# Patient Record
Sex: Female | Born: 1985 | Race: Black or African American | Hispanic: No | Marital: Single | Smoking: Never smoker
Health system: Southern US, Community
[De-identification: ages and names within clinical notes are randomized; demographics above are authoritative.]

## PROBLEM LIST (undated history)

## (undated) DIAGNOSIS — C801 Malignant (primary) neoplasm, unspecified: Secondary | ICD-10-CM

## (undated) DIAGNOSIS — Z800 Family history of malignant neoplasm of digestive organs: Secondary | ICD-10-CM

## (undated) DIAGNOSIS — Z801 Family history of malignant neoplasm of trachea, bronchus and lung: Secondary | ICD-10-CM

## (undated) DIAGNOSIS — I10 Essential (primary) hypertension: Secondary | ICD-10-CM

## (undated) DIAGNOSIS — C50919 Malignant neoplasm of unspecified site of unspecified female breast: Secondary | ICD-10-CM

## (undated) DIAGNOSIS — I509 Heart failure, unspecified: Secondary | ICD-10-CM

## (undated) HISTORY — DX: Family history of malignant neoplasm of digestive organs: Z80.0

## (undated) HISTORY — DX: Family history of malignant neoplasm of trachea, bronchus and lung: Z80.1

## (undated) HISTORY — DX: Essential (primary) hypertension: I10

---
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
## 2011-06-21 HISTORY — PX: OOPHORECTOMY: SHX86

## 2018-04-03 ENCOUNTER — Other Ambulatory Visit: Payer: Self-pay | Admitting: Family Medicine

## 2018-04-03 DIAGNOSIS — N6310 Unspecified lump in the right breast, unspecified quadrant: Secondary | ICD-10-CM

## 2018-04-09 ENCOUNTER — Other Ambulatory Visit: Payer: Self-pay

## 2018-04-17 ENCOUNTER — Ambulatory Visit
Admission: RE | Admit: 2018-04-17 | Discharge: 2018-04-17 | Disposition: A | Discharge status: Home / Self Care | Payer: BLUE CROSS/BLUE SHIELD | Source: Ambulatory Visit | Attending: Family Medicine | Admitting: Family Medicine

## 2018-04-17 ENCOUNTER — Other Ambulatory Visit: Payer: Self-pay | Admitting: Family Medicine

## 2018-04-17 DIAGNOSIS — N6310 Unspecified lump in the right breast, unspecified quadrant: Secondary | ICD-10-CM

## 2018-04-17 DIAGNOSIS — R599 Enlarged lymph nodes, unspecified: Secondary | ICD-10-CM

## 2018-04-19 ENCOUNTER — Telehealth: Payer: Self-pay | Admitting: Hematology and Oncology

## 2018-04-19 ENCOUNTER — Encounter: Payer: Self-pay | Admitting: *Deleted

## 2018-04-19 DIAGNOSIS — C50211 Malignant neoplasm of upper-inner quadrant of right female breast: Secondary | ICD-10-CM | POA: Insufficient documentation

## 2018-04-19 DIAGNOSIS — Z170 Estrogen receptor positive status [ER+]: Principal | ICD-10-CM

## 2018-04-19 NOTE — Telephone Encounter (Signed)
Spoke with patient to confirm morning [facility] appointment for 11/6, packet will be mailed to patient

## 2018-04-25 ENCOUNTER — Ambulatory Visit: Payer: Self-pay | Admitting: Surgery

## 2018-04-25 ENCOUNTER — Inpatient Hospital Stay: Payer: BLUE CROSS/BLUE SHIELD

## 2018-04-25 ENCOUNTER — Encounter: Payer: Self-pay | Admitting: Physical Therapy

## 2018-04-25 ENCOUNTER — Ambulatory Visit
Admission: RE | Admit: 2018-04-25 | Discharge: 2018-04-25 | Disposition: A | Discharge status: Home / Self Care | Payer: BLUE CROSS/BLUE SHIELD | Source: Ambulatory Visit | Attending: Radiation Oncology | Admitting: Radiation Oncology

## 2018-04-25 ENCOUNTER — Other Ambulatory Visit: Payer: Self-pay | Admitting: *Deleted

## 2018-04-25 ENCOUNTER — Encounter: Payer: Self-pay | Admitting: Hematology and Oncology

## 2018-04-25 ENCOUNTER — Ambulatory Visit: Payer: BLUE CROSS/BLUE SHIELD | Attending: Surgery | Admitting: Physical Therapy

## 2018-04-25 ENCOUNTER — Inpatient Hospital Stay: Payer: BLUE CROSS/BLUE SHIELD | Attending: Hematology and Oncology | Admitting: Hematology and Oncology

## 2018-04-25 ENCOUNTER — Encounter: Payer: Self-pay | Admitting: *Deleted

## 2018-04-25 ENCOUNTER — Other Ambulatory Visit: Payer: Self-pay

## 2018-04-25 VITALS — BP 157/99 | HR 91 | Temp 98.4°F | Resp 17 | Ht 61.0 in | Wt 155.1 lb

## 2018-04-25 DIAGNOSIS — Z170 Estrogen receptor positive status [ER+]: Principal | ICD-10-CM

## 2018-04-25 DIAGNOSIS — Z5111 Encounter for antineoplastic chemotherapy: Secondary | ICD-10-CM | POA: Diagnosis present

## 2018-04-25 DIAGNOSIS — R0602 Shortness of breath: Secondary | ICD-10-CM | POA: Insufficient documentation

## 2018-04-25 DIAGNOSIS — Z5189 Encounter for other specified aftercare: Secondary | ICD-10-CM | POA: Diagnosis not present

## 2018-04-25 DIAGNOSIS — C50211 Malignant neoplasm of upper-inner quadrant of right female breast: Secondary | ICD-10-CM

## 2018-04-25 DIAGNOSIS — Z452 Encounter for adjustment and management of vascular access device: Secondary | ICD-10-CM | POA: Insufficient documentation

## 2018-04-25 DIAGNOSIS — I10 Essential (primary) hypertension: Secondary | ICD-10-CM | POA: Diagnosis not present

## 2018-04-25 DIAGNOSIS — R293 Abnormal posture: Secondary | ICD-10-CM | POA: Insufficient documentation

## 2018-04-25 DIAGNOSIS — R110 Nausea: Secondary | ICD-10-CM | POA: Diagnosis not present

## 2018-04-25 LAB — CMP (CANCER CENTER ONLY)
ALBUMIN: 3.9 g/dL (ref 3.5–5.0)
ALK PHOS: 48 U/L (ref 38–126)
ALT: 12 U/L (ref 0–44)
AST: 16 U/L (ref 15–41)
Anion gap: 9 (ref 5–15)
BUN: 9 mg/dL (ref 6–20)
CO2: 25 mmol/L (ref 22–32)
Calcium: 9.5 mg/dL (ref 8.9–10.3)
Chloride: 106 mmol/L (ref 98–111)
Creatinine: 0.79 mg/dL (ref 0.44–1.00)
GFR, Est AFR Am: >60 mL/min (ref >60)
GFR, Estimated: >60 mL/min (ref >60)
GLUCOSE: 82 mg/dL (ref 70–99)
POTASSIUM: 4.1 mmol/L (ref 3.5–5.1)
SODIUM: 140 mmol/L (ref 135–145)
Total Bilirubin: 0.5 mg/dL (ref 0.3–1.2)
Total Protein: 8 g/dL (ref 6.5–8.1)

## 2018-04-25 LAB — CBC WITH DIFFERENTIAL (CANCER CENTER ONLY)
ABS IMMATURE GRANULOCYTES: 0.01 10*3/uL (ref 0.00–0.07)
Basophils Absolute: 0 10*3/uL (ref 0.0–0.1)
Basophils Relative: 1 %
Eosinophils Absolute: 0.1 10*3/uL (ref 0.0–0.5)
Eosinophils Relative: 1 %
HCT: 42.3 % (ref 36.0–46.0)
Hemoglobin: 13.5 g/dL (ref 12.0–15.0)
IMMATURE GRANULOCYTES: 0 %
LYMPHS ABS: 1.5 10*3/uL (ref 0.7–4.0)
Lymphocytes Relative: 34 %
MCH: 27.7 pg (ref 26.0–34.0)
MCHC: 31.9 g/dL (ref 30.0–36.0)
MCV: 86.7 fL (ref 80.0–100.0)
MONOS PCT: 10 %
Monocytes Absolute: 0.5 10*3/uL (ref 0.1–1.0)
NEUTROS ABS: 2.4 10*3/uL (ref 1.7–7.7)
NEUTROS PCT: 54 %
PLATELETS: 249 10*3/uL (ref 150–400)
RBC: 4.88 MIL/uL (ref 3.87–5.11)
RDW: 12.5 % (ref 11.5–15.5)
WBC Count: 4.4 10*3/uL (ref 4.0–10.5)
nRBC: 0 % (ref 0.0–0.2)

## 2018-04-25 MED ORDER — LIDOCAINE-PRILOCAINE 2.5-2.5 % EX CREA: TOPICAL_CREAM | CUTANEOUS | 3 refills | Status: DC

## 2018-04-25 MED ORDER — DEXAMETHASONE 4 MG PO TABS: 4.0000 mg | ORAL_TABLET | Freq: Every day | ORAL | 0 refills | Status: DC

## 2018-04-25 MED ORDER — ONDANSETRON HCL 8 MG PO TABS: 8.0000 mg | ORAL_TABLET | Freq: Two times a day (BID) | ORAL | 1 refills | Status: DC | PRN

## 2018-04-25 MED ORDER — PROCHLORPERAZINE MALEATE 10 MG PO TABS: 10.0000 mg | ORAL_TABLET | Freq: Four times a day (QID) | ORAL | 1 refills | Status: DC | PRN

## 2018-04-25 MED ORDER — LORAZEPAM 0.5 MG PO TABS: 0.5000 mg | ORAL_TABLET | Freq: Every evening | ORAL | 0 refills | Status: DC | PRN

## 2018-04-25 NOTE — Progress Notes (Signed)
Radiation Oncology         [phone] ________________________________  Name: Miranda Ayers        MRN: [id number]  Date of Service: 04/25/2018 DOB: 1985-08-22  AX:[id number], Lauralyn Primes, DO  No ref. provider found     REFERRING PHYSICIAN: No ref. provider found   DIAGNOSIS: The encounter diagnosis was Malignant neoplasm of upper-inner quadrant of right breast in female, estrogen receptor positive ([place]).   HISTORY OF PRESENT ILLNESS: Miranda Ayers is a 32 y.o. female seen in the multidisciplinary breast clinic for a new diagnosis of right breast cancer. The patient was noted to have a palpable mas sin her right breast and diagnostic imaging revealed a change in the upper outer and inner quadrants with multiple masses spanning 16 cm with fine calcifications spanning 12-14 cm. She had a 1.8 cm mass at 12:30, a 10:00 4.5 cm mass, and at 9:30 a 5 cm mass. She had a biopsy of the 1:00 position revealing invasive ductal caricnoma with clacifications, grade 3 and DCIS, ER/PR positive, HER2 negative, with a Ki 67 of 20%. Her 10:00 site revealed grade 2-3 invasive ductal carcinoma with DCIS, ER/PR positive, HER2 negative with a Ki 67 of 20% and her lymph node was also sampled and involved with carcinoma. She comes today to discuss treatment recommendations for her cancer.    PREVIOUS RADIATION THERAPY: No   PAST MEDICAL HISTORY:  Past Medical History:  Diagnosis Date  . Hypertension        PAST SURGICAL HISTORY: Past Surgical History:  Procedure Laterality Date  . OOPHORECTOMY  2013     FAMILY HISTORY:  Family History  Problem Relation Age of Onset  . Lung cancer Maternal Grandmother   . Lung cancer Maternal Grandfather   . Stomach cancer Paternal Grandfather   . Lung cancer Maternal Aunt   . Stomach cancer Maternal Aunt   . Lung cancer Maternal Uncle      SOCIAL HISTORY:  reports that she has never smoked. She has never used smokeless tobacco. She reports that she  does not drink alcohol or use drugs. The patient is single and lives in [place], she works for [employer]. She has a 91 year old daughter and is accompanied by her mother.    ALLERGIES: Patient has no allergy information on record.   MEDICATIONS:  No current outpatient medications on file.   No current facility-administered medications for this encounter.      REVIEW OF SYSTEMS: On review of systems, the patient reports that she is doing well overall. She denies any chest pain, shortness of breath, cough, fevers, chills, night sweats, unintended weight changes. She denies any bowel or bladder disturbances, and denies abdominal pain, nausea or vomiting. She denies any new musculoskeletal or joint aches or pains. A complete review of systems is obtained and is otherwise negative.     PHYSICAL EXAM:  Wt Readings from Last 3 Encounters:  04/25/18 155 lb 1.6 oz (70.4 kg)   Temp Readings from Last 3 Encounters:  04/25/18 98.4 F (36.9 C) (Oral)   BP Readings from Last 3 Encounters:  04/25/18 (!) 157/99   Pulse Readings from Last 3 Encounters:  04/25/18 91   In general this is a well appearing African American in no acute distress. She's alert and oriented x4 and appropriate throughout the examination. Cardiopulmonary assessment is negative for acute distress and she exhibits normal effort. Bilateral breast exam is deferred.   ECOG = 1  0 - Asymptomatic (Fully active,  able to carry on all predisease activities without restriction)  1 - Symptomatic but completely ambulatory (Restricted in physically strenuous activity but ambulatory and able to carry out work of a light or sedentary nature. For example, light housework, office work)  2 - Symptomatic, <50% in bed during the day (Ambulatory and capable of all self care but unable to carry out any work activities. Up and about more than 50% of waking hours)  3 - Symptomatic, >50% in bed, but not bedbound (Capable of only limited self-care,  confined to bed or chair 50% or more of waking hours)  4 - Bedbound (Completely disabled. Cannot carry on any self-care. Totally confined to bed or chair)  5 - Death   Eustace Pen MM, Creech RH, Tormey DC, et al. [phone]). "Toxicity and response criteria of the [facility]". [place] Oncol. 5 (6): 649-55    LABORATORY DATA:  Lab Results  Component Value Date   WBC 4.4 04/25/2018   HGB 13.5 04/25/2018   HCT 42.3 04/25/2018   MCV 86.7 04/25/2018   PLT 249 04/25/2018   Lab Results  Component Value Date   NA 140 04/25/2018   K 4.1 04/25/2018   CL 106 04/25/2018   CO2 25 04/25/2018   Lab Results  Component Value Date   ALT 12 04/25/2018   AST 16 04/25/2018   ALKPHOS 48 04/25/2018   BILITOT 0.5 04/25/2018      RADIOGRAPHY: [place] Axillary Node Core Biopsy Right  Result Date: 04/17/2018 CLINICAL DATA:  Suspicious right breast 1 o'clock mass, and enlarged right axillary lymph node. EXAM: ULTRASOUND GUIDED RIGHT BREAST CORE NEEDLE BIOPSY COMPARISON:  Previous exam(s). FINDINGS: I met with the patient and we discussed the procedure of ultrasound-guided biopsy, including benefits and alternatives. We discussed the high likelihood of a successful procedure. We discussed the risks of the procedure, including infection, bleeding, tissue injury, clip migration, and inadequate sampling. Informed written consent was given. The usual time-out protocol was performed immediately prior to the procedure. Lesion quadrant: Upper inner quadrant, and right axilla. Using sterile technique and 1% Lidocaine as local anesthetic, under direct ultrasound visualization, a 14 gauge spring-loaded device was used to perform biopsy of right breast 1 o'clock mass using a inferior approach. At the conclusion of the procedure a ribbon shaped tissue marker clip was deployed into the biopsy cavity. Next, using sterile technique and 1% Lidocaine as local anesthetic, under direct ultrasound visualization, a  14 gauge spring-loaded device was used to perform biopsy of an enlarged right axillary lymph node using a inferior approach. At the conclusion of the procedure a spiral shaped HydroMARK tissue marker clip was deployed into the biopsy cavity. Follow up 2 view mammogram was performed and dictated separately. IMPRESSION: Ultrasound guided biopsy of right breast 1 o'clock mass, and abnormal right axillary lymph node. No apparent complications. Electronically Signed   By: Fidela Salisbury M.D.   On: 04/17/2018 09:49   Mm Clip Placement Right  Result Date: 04/17/2018 CLINICAL DATA:  Post ultrasound-guided core needle biopsy of right breast 1 o'clock mass, and right axillary lymph node, and post stereotactic core needle biopsy of right lateral breast calcifications. EXAM: DIAGNOSTIC RIGHT MAMMOGRAM POST ULTRASOUND AND STEREOTACTIC BIOPSY COMPARISON:  Previous exam(s). FINDINGS: Mammographic images were obtained following ultrasound and stereotactic guided biopsy of the right breast. Two-view mammography demonstrates presence of ribbon shaped marker within the 1 o'clock right breast mass. Coil shaped marker is seen in the slightly upper outer right breast, middle  depth, at the site of the stereotactic core needle biopsy. The 2 markers are 10.4 cm apart in transverse dimension and 5.9 cm apart in anterior to posterior dimension. A HydroMARK is also seen within the right axilla post biopsy of a right axillary lymph node. IMPRESSION: Successful placement of post biopsy tissue markers, post ultrasound-guided core needle biopsy of right breast 1 o'clock mass, right axillary lymph node, and stereotactic core needle biopsy of right lateral breast calcifications. Final Assessment: Post Procedure Mammograms for Marker Placement Electronically Signed   By: Fidela Salisbury M.D.   On: 04/17/2018 09:43   Mm Rt Breast Bx W Loc Dev 1st Lesion Image Bx Spec Stereo Guide  Addendum Date: 04/19/2018   ADDENDUM REPORT:  04/18/2018 13:56 ADDENDUM: Pathology revealed GRADE III INVASIVE DUCTAL CARCINOMA WITH CALCIFICATIONS, DUCTAL CARCINOMA IN SITU of the Right breast, 1 o'clock. 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 This was found to be concordant by Dr. Fidela Salisbury. Pathology revealed GRADE II-III INVASIVE DUCTAL CARCINOMA WITH CALCIFICATIONS of the Right breast, lateral. This was found to be concordant by Dr. Fidela Salisbury. Pathology results were discussed with the patient by telephone. The patient reported doing well after the biopsies with tenderness at the sites. Post biopsy instructions and care were reviewed and questions were answered. The patient was encouraged to call [facility] for any additional concerns. The patient was referred to [facility] on April 25, 2018. Recommendation for a bilateral breast MRI for further evaluation of extent of disease and pre menopausal. Pathology results reported by Terie Purser, RN on 04/18/2018. Electronically Signed   By: Fidela Salisbury M.D.   On: 04/18/2018 13:56   Result Date: 04/19/2018 CLINICAL DATA:  Suspicious right lateral breast calcifications. EXAM: RIGHT BREAST STEREOTACTIC CORE NEEDLE BIOPSY COMPARISON:  Previous exams. FINDINGS: The patient and I discussed the procedure of stereotactic-guided biopsy including benefits and alternatives. We discussed the high likelihood of a successful procedure. We discussed the risks of the procedure including infection, bleeding, tissue injury, clip migration, and inadequate sampling. Informed written consent was given. The usual time out protocol was performed immediately prior to the procedure. Using sterile technique and 1% Lidocaine as local anesthetic, under stereotactic guidance, a 9 gauge vacuum assisted device was used to perform core needle biopsy of calcifications in the slightly upper outer  quadrant of the right breast. Using a superior approach. Specimen radiograph was performed showing presence of calcifications. Specimens with calcifications are identified for pathology. Lesion quadrant: Upper outer quadrant At the conclusion of the procedure, a coil shaped tissue marker clip was deployed into the biopsy cavity. Follow-up 2-view mammogram was performed and dictated separately. IMPRESSION: Stereotactic-guided biopsy of right breast. No apparent complications. Electronically Signed: By: Fidela Salisbury M.D. On: 04/17/2018 09:46   [place] Rt Breast Bx W Loc Dev 1st Lesion Img Bx Spec US Guide  Result Date: 04/17/2018 CLINICAL DATA:  Suspicious right breast 1 o'clock mass, and enlarged right axillary lymph node. EXAM: ULTRASOUND GUIDED RIGHT BREAST CORE NEEDLE BIOPSY COMPARISON:  Previous exam(s). FINDINGS: I met with the patient and we discussed the procedure of ultrasound-guided biopsy, including benefits and alternatives. We discussed the high likelihood of a successful procedure. We discussed the risks of the procedure, including infection, bleeding, tissue injury, clip migration, and inadequate sampling. Informed written consent was given. The usual time-out protocol was performed immediately prior to the procedure. Lesion quadrant: Upper inner quadrant,  and right axilla. Using sterile technique and 1% Lidocaine as local anesthetic, under direct ultrasound visualization, a 14 gauge spring-loaded device was used to perform biopsy of right breast 1 o'clock mass using a inferior approach. At the conclusion of the procedure a ribbon shaped tissue marker clip was deployed into the biopsy cavity. Next, using sterile technique and 1% Lidocaine as local anesthetic, under direct ultrasound visualization, a 14 gauge spring-loaded device was used to perform biopsy of an enlarged right axillary lymph node using a inferior approach. At the conclusion of the procedure a spiral shaped HydroMARK tissue marker  clip was deployed into the biopsy cavity. Follow up 2 view mammogram was performed and dictated separately. IMPRESSION: Ultrasound guided biopsy of right breast 1 o'clock mass, and abnormal right axillary lymph node. No apparent complications. Electronically Signed   By: Fidela Salisbury M.D.   On: 04/17/2018 09:49       IMPRESSION/PLAN: 1. 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  Dr. Lisbeth Renshaw discusses the pathology findings and reviews the nature of node positive invasive ductal carcinoma. She is planning to proceed with genetics, and neoadjuvant therapy. She would benefit from bilateral mastectomies and right ALND. Following surgery, she would also benefit from adjuvant external radiotherapy to the breast followed by antiestrogen therapy. We discussed the risks, benefits, short, and long term effects of radiotherapy, and the patient is interested in proceeding. Dr. Lisbeth Renshaw discusses the delivery and logistics of radiotherapy and anticipates a course of 6 1/2 weeks of radiotherapy with right regional node coverage. We will see her back about 2 weeks after surgery to discuss the simulation process and anticipate we starting radiotherapy about 4-6 weeks after surgery.  2. Possible genetic predisposition to malignancy. The patient is a candidate for genetic testing given her personal and family history. She was offered referral and will be seen asap.  In a visit lasting 60 minutes, greater than 50% of the time was spent face to face discussing her case, and coordinating the patient's care.   The above documentation reflects my direct findings during this shared patient visit. Please see the separate note by Dr. Lisbeth Renshaw on this date for the remainder of the patient's plan of care.    Carola Rhine, PAC

## 2018-04-25 NOTE — H&P (View-Only) (Signed)
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 She is sore but no other symptoms.                    Suspicious right breast 1 o'clock mass, and enlarged right axillary lymph node.  EXAM: ULTRASOUND GUIDED RIGHT BREAST CORE NEEDLE BIOPSY  COMPARISON: Previous exam(s).  FINDINGS: I met with the patient and we discussed the procedure of ultrasound-guided biopsy, including benefits and alternatives. We discussed the high likelihood of a successful procedure. We discussed the risks of the procedure, including infection, bleeding, tissue injury, clip migration, and inadequate sampling. Informed written consent was given. The usual time-out protocol was performed immediately prior to the procedure.  Lesion quadrant: Upper inner quadrant, and right axilla.  Using sterile technique and 1% Lidocaine as local anesthetic, under direct ultrasound visualization, a 14 gauge spring-loaded device was used to perform biopsy of right breast 1 o'clock mass using a inferior approach. At the conclusion of the procedure a ribbon shaped tissue marker clip was deployed into the biopsy cavity.  Next, using sterile technique and 1% Lidocaine as local anesthetic, under direct ultrasound visualization, a 14 gauge spring-loaded device was used to perform biopsy of an enlarged right axillary lymph node using a inferior approach. At the conclusion of the procedure a spiral shaped HydroMARK tissue marker clip was deployed into the biopsy cavity.  Follow up 2 view mammogram was performed and dictated  separately.  IMPRESSION: Ultrasound guided biopsy of right breast 1 o'clock mass, and abnormal right axillary lymph node. No apparent complications.   Electronically Signed By: Miranda Ayers M.D. On: 04/17/2018 09:49                                  INFORMATION: 1. FLUORESCENCE IN-SITU HYBRIDIZATION Results: GROUP 4: HER2 **NEGATIVE** Equivocal form of amplification of the HER2 gene was detected in the IHC 2+ tissue sample received from this individual. HER2 FISH was performed by a technologist and cell imaging and analysis on the [employer]View. First Analysis: RATIO OF HER2/CEN 17 SIGNALS 1.79 AVERAGE HER2 COPY NUMBER PER CELL 4.65 An additional 20 cells were analyzed by FISH. Additional Analysis: RATIO OF HER2/CEN 17 SIGNALS 1.78 AVERAGE HER2 COPY NUMBER PER CELL 4.45 The ratio of HER2/CEN 17 is within the normal range < 2.0 of HER2/CEN 17 and a copy number of HER2 signals per cell is >=4.0 and < 6.0. Arch Pathol Lab Med 1:1,2018 COMMENT: It is uncertain whether patients with >=4.0 and <6.0 average HER2 signals/cell and HER2/CEN 17 ratio <2.0 benefit from HER2 targeted therapy in the absence of protein overexpression (IHC 3+). If the specimen test result is close to the ISH ratio threshold for positive, there is a high likelihood that repeat testing will result in different results by chance alone. Therefore, when [facility] results are not 3+ positive, it is recommended that the sample be considered HER2 negative without additional testing on the same specimen. Miranda Males MD Pathologist, Electronic Signature ( Signed 04/20/2018) 1. PROGNOSTIC INDICATORS 1 of 4 FINAL for Miranda Ayers [phone]) ADDITIONAL INFORMATION:(continued)  Results: IMMUNOHISTOCHEMICAL AND MORPHOMETRIC ANALYSIS PERFORMED MANUALLY The tumor cells are EQUIVOCAL for Her2 (2+). Her2 by FISH will be performed and the results reported separately. Estrogen  Receptor: 80%, POSITIVE, STRONG STAINING INTENSITY Progesterone Receptor: 2%, POSITIVE, MODERATE STAINING INTENSITY Proliferation Marker Ki67: 20% REFERENCE RANGE ESTROGEN RECEPTOR NEGATIVE 0% POSITIVE =>1% REFERENCE RANGE PROGESTERONE RECEPTOR NEGATIVE 0% POSITIVE =>1% All controls stained appropriately Miranda Males MD Pathologist, Electronic Signature ( Signed 04/19/2018) 3. FLUORESCENCE IN-SITU HYBRIDIZATION Results: GROUP 5: HER2 **NEGATIVE** Equivocal form of amplification of the HER2 gene was detected in the IHC 2+ tissue sample received from this individual. HER2 FISH was performed by a technologist and cell imaging and analysis on the BioView. RATIO OF HER2/CEN17 SIGNALS 1.60 AVERAGE HER2 COPY NUMBER PER CELL 3.20 The ratio of HER2/CEN 17 is within the range < 2.0 of HER2/CEN 17 and a copy number of HER2 signals per cell is <4.0. Arch Pathol Lab Med 1:1,2018 Miranda Males MD Pathologist, Electronic Signature ( Signed 04/20/2018) 3. Results: IMMUNOHISTOCHEMICAL AND MORPHOMETRIC ANALYSIS PERFORMED MANUALLY The tumor cells are EQUIVOCAL for Her2 (2+). Her2 by FISH will be performed and the results reported separately. Estrogen Receptor: 50%, POSITIVE, STRONG STAINING INTENSITY Progesterone Receptor: 5%, POSITIVE, STRONG STAINING INTENSITY Proliferation Marker Ki67: 20% 2 of 4 FINAL for Miranda Ayers ([id number]) ADDITIONAL INFORMATION:(continued) REFERENCE RANGE ESTROGEN RECEPTOR NEGATIVE 0% POSITIVE =>1% REFERENCE RANGE PROGESTERONE RECEPTOR NEGATIVE 0% POSITIVE =>1% All controls stained appropriately Miranda Males MD Pathologist, Electronic Signature ( Signed 04/19/2018) FINAL DIAGNOSIS Diagnosis 1. Breast, right, needle core biopsy, 1 o'clock - INVASIVE DUCTAL CARCINOMA WITH CALCIFICATIONS, SEE COMMENT. - DUCTAL CARCINOMA IN SITU. 2. Lymph node, needle/core biopsy, right axilla - METASTATIC CARCINOMA IN ONE LYMPH NODE. 3. Breast, right, needle core  biopsy, lateral calcifications - INVASIVE DUCTAL CARCINOMA WITH CALCIFICATIONS, SEE COMMENT. Microscopic Comment 1. The carcinoma appears grade 3. Prognostic markers will be ordered. Dr. Lyndon Code has reviewed the case. The case was called to [place] on 04/18/2018. 2. The morphology best corresponds with part #3. 3. The carcinoma appears grade 2-3. The morphology is slightly different than part #1. Prognostic markers will be ordered. Dr. Lyndon Code has reviewed the case. The case was called to [facility] on 04/18/2018. Miranda Males MD Pathologist, Electronic Signature (Case signed 04/18/2018) Specimen Gross and Clinical Information Specimen Comment 1. TIF 8 AM; extracted < 2 min; suspicious right breast mass with malignant looking calcifications, involving up to 16cm of tissue 2. TIF 8:30 AM; extracted < 2 min; probably malignant right axillary LN 3. TIF: 9:15 AM; extracted < 5 min Specimen(s) Obtained: 1. Breast, right, needle core biopsy, 1 o'clock 2. Lymph node, needle/core biopsy, right axilla 3. Breast, right, needle core biopsy, lateral calcifications 3 of 4 FINAL for [place], Sherald Ayers [phone]) Specimen Clinical Information 1. Multicentric right breast cancer Gross 1. Received in formalin (TIF 8 a.m., CIT less than 2 minutes) are four cores of soft tan-yellow tissue measuring 0.7 to 1.8 cm in length and each 0.2 cm in diameter. The specimen is submitted in toto. 2. Received in formalin (TIF 8:30 a.m., CIT less than 2 minutes) are three cores of soft tan-yellow tissue measuring 0.4 to 1.0 cm in length and each 0.2 cm in diameter. The specimen is submitted in toto. 3. Received in formalin (TIF 9:15 a.m., CIT less than 5 minutes) are multiple fragments and cores of soft tan-yellow tissue measuring 1.5 x 0.8 x 0.2 cm in aggregate. The specimen is submitted in toto. (  GP:ah 04/17/18 ) Stain(s) used in Diagnosis: The following stain(s)  were used in diagnosing the case: ER-ACIS, PR-ACIS, Her2 FISH, Her2 by IHC, KI-67-ACIS. The control(s) stained appropriately. Disclaimer IHC scores are reported using ASCO/CAP scoring criteria. An IHC Score of 0 or 1+ is NEGATIVE for HER2, 3+ is POSITIVE for HER2, and 2+ is EQUIVOCAL. Equivocal results are reflexed to either FISH or IHC testing. Specimens are fixed in 10% Neutral Buffered Formalin for at least 6 hours and up to 72 hours. These tests have not be validated on decalcified tissue. Results should be interpreted with caution given the possibility of false negative results on decalcified specimens. PR progesterone receptor (16), immunohistochemical stains are performed on formalin fixed, paraffin embedded tissue using a 3,3"-diaminobenzidine (DAB) chromogen and Leica Bond Autostainer System. The staining intensity of the nucleus is scored manually and is reported as the percentage of tumor cell nuclei demonstrating specific nuclear staining.Specimens are fixed in 10% Neutral Buffered Formalin for at least 6 hours and up to 72 hours. These tests have not be validated on decalcified tissue. Results should be interpreted with caution given the possibility of false negative results on decalcified specimens. Ki-67 (MM1), immunohistochemical stains are performed on formalin fixed, paraffin embedded tissue using a 3,3"-diaminobenzidine (DAB) chromogen and Leica Bond Autostainer System. The staining intensity of the nucleus is scored manually and is reported as the percentage of tumor cell nuclei demonstrating specific nuclear staining.Specimens are fixed in 10% Neutral Buffered Formalin for at least 6 hours and up to 72 hours. These tests have not be validated on decalcified tissue. Results should be interpreted with caution.  The patient is a 32 year old female.   Past Surgical History (Sylvia Ledford, RN; 04/25/2018 7:32 AM) Breast Biopsy Right.  Diagnostic Studies History (Sylvia Ledford,  RN; 04/25/2018 7:32 AM) Colonoscopy never Mammogram within last year Pap Smear 1-5 years ago  Medication History (Sylvia Ledford, RN; 04/25/2018 7:32 AM) Medications Reconciled  Social History (Sylvia Ledford, RN; 04/25/2018 7:32 AM) Alcohol use Occasional alcohol use. Caffeine use Carbonated beverages, Coffee, Tea. No drug use Tobacco use Former smoker.  Family History (Sylvia Ledford, RN; 04/25/2018 7:32 AM) Alcohol Abuse Family Members In General. Arthritis Family Members In General, Father. Cerebrovascular Accident Family Members In General. Cervical Cancer Family Members In General. Colon Cancer Family Members In General. Colon Polyps Mother. Diabetes Mellitus Family Members In General, Father. Heart Disease Family Members In General, Mother, Sister. Heart disease in female family member before age 65 Hypertension Family Members In General, Father, Mother. Prostate Cancer Family Members In General.  Pregnancy / Birth History (Sylvia Ledford, RN; 04/25/2018 7:32 AM) Gravida 1 Para 1  Other Problems (Sylvia Ledford, RN; 04/25/2018 7:32 AM) Back Pain High blood pressure Lump In Breast Migraine Headache Oophorectomy Right. Seizure Disorder     Review of Systems (Sylvia Ledford RN; 04/25/2018 7:32 AM) General Present- Weight Loss. Not Present- Appetite Loss, Chills, Fatigue, Fever, Night Sweats and Weight Gain. Skin Present- Dryness. Not Present- Change in Wart/Mole, Hives, Jaundice, New Lesions, Non-Healing Wounds, Rash and Ulcer. HEENT Not Present- Earache, Hearing Loss, Hoarseness, Nose Bleed, Oral Ulcers, Ringing in the Ears, Seasonal Allergies, Sinus Pain, Sore Throat, Visual Disturbances, Wears glasses/contact lenses and Yellow Eyes. Respiratory Not Present- Bloody sputum, Chronic Cough, Difficulty Breathing, Snoring and Wheezing. Breast Present- Breast Mass and Breast Pain. Not Present- Nipple Discharge and Skin Changes. Cardiovascular Not  Present- Chest Pain, Difficulty Breathing Lying Down, Leg Cramps, Palpitations, Rapid Heart Rate, Shortness of Breath and Swelling of   Extremities. Gastrointestinal Present- Change in Bowel Habits, Gets full quickly at meals, Indigestion, Nausea and Vomiting. Not Present- Abdominal Pain, Bloating, Bloody Stool, Chronic diarrhea, Constipation, Difficulty Swallowing, Excessive gas, Hemorrhoids and Rectal Pain. Female Genitourinary Not Present- Frequency, Nocturia, Painful Urination, Pelvic Pain and Urgency. Musculoskeletal Present- Back Pain. Not Present- Joint Pain, Joint Stiffness, Muscle Pain, Muscle Weakness and Swelling of Extremities. Neurological Present- Headaches. Not Present- Decreased Memory, Fainting, Numbness, Seizures, Tingling, Tremor, Trouble walking and Weakness. Psychiatric Not Present- Anxiety, Bipolar, Change in Sleep Pattern, Depression, Fearful and Frequent crying. Endocrine Present- Hot flashes. Not Present- Cold Intolerance, Excessive Hunger, Hair Changes, Heat Intolerance and New Diabetes. Hematology Present- Easy Bruising. Not Present- Blood Thinners, Excessive bleeding, Gland problems, HIV and Persistent Infections.   Physical Exam (Oris Staffieri A. Tocara Mennen MD; 04/25/2018 11:56 AM)  General Mental Status-Alert. General Appearance-Consistent with stated age. Hydration-Well hydrated. Voice-Normal.  Eye Eyeball - Bilateral-Extraocular movements intact. Sclera/Conjunctiva - Bilateral-No scleral icterus.  Chest and Lung Exam Chest and lung exam reveals -quiet, even and easy respiratory effort with no use of accessory muscles and on auscultation, normal breath sounds, no adventitious sounds and normal vocal resonance. Inspection Chest Wall - Normal. Back - normal.  Breast Note: large right breast mass 4 cm mobile multiple enlarged LN right axilla  left breast normal  Cardiovascular Cardiovascular examination reveals -normal heart sounds, regular rate and  rhythm with no murmurs and normal pedal pulses bilaterally.  Neurologic Neurologic evaluation reveals -alert and oriented x 3 with no impairment of recent or remote memory. Mental Status-Normal.  Musculoskeletal Normal Exam - Left-Upper Extremity Strength Normal and Lower Extremity Strength Normal. Normal Exam - Right-Upper Extremity Strength Normal and Lower Extremity Strength Normal.  Lymphatic Head & Neck  General Head & Neck Lymphatics: Bilateral - Description - Normal. Axillary  General Axillary Region: Bilateral - Description - Normal. Tenderness - Non Tender.    Assessment & Plan (Anola Mcgough A. Alishah Schulte MD; 04/25/2018 11:58 AM)  CANCER OF OVERLAPPING SITES OF RIGHT FEMALE BREAST (C50.811)   MALIGNANT NEOPLASM OF OVERLAPPING SITES OF RIGHT BREAST IN FEMALE, ESTROGEN RECEPTOR NEGATIVE (C50.811) Impression: Pt will need port for chemotherapy   she will need MRM after treatment due to multiple sites in the same breast and multiple posiitve LN   Pt requires port placement for chemotherapy. Risk include bleeding, infection, pneumothorax, hemothorax, mediastinal injury, nerve injury , blood vessel injury, strke, blood clots, death, migration. embolization and need for additional procedures. Pt agrees to proceed.

## 2018-04-25 NOTE — Patient Instructions (Signed)

## 2018-04-25 NOTE — Progress Notes (Signed)
Clinical Social Work [facility] Psychosocial Distress Screening [place]  Patient completed distress screening protocol and scored a 6 on the Psychosocial Distress Thermometer which indicates moderate distress. Clinical Social Worker met with patient and patients family in [facility] to assess for distress and other psychosocial needs. Patient stated she was feeling overwhelmed but felt "better" after meeting with the treatment team and getting more information on her treatment plan. CSW and patient discussed common feeling and emotions when being diagnosed with cancer, and the importance of support during treatment. CSW informed patient of the support team and support services at [facility]. CSW provided contact information and encouraged patient to call with any questions or concerns.  ONCBCN DISTRESS SCREENING 04/25/2018  Screening Type Initial Screening  Distress experienced in past week (1-10) 6  Practical problem type Housing  Emotional problem type Adjusting to illness;Boredom;Adjusting to appearance changes  Spiritual/Religous concerns type Relating to God  Physical Problem type Pain  Physician notified of physical symptoms Yes     Johnnye Lana, MSW, LCSW, OSW-C Clinical Social Worker [place] [phone]

## 2018-04-25 NOTE — H&P (Signed)
Miranda Ayers Documented: 04/25/2018 7:32 AM Location: [facility] Patient #: [id number] DOB: 1986/04/25 Undefined / Language: [name] / Race: Black or African American Female  History of Present Illness Marcello Moores A. 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 She is sore but no other symptoms.                    Suspicious right breast 1 o'clock mass, and enlarged right axillary lymph node.  EXAM: ULTRASOUND GUIDED RIGHT BREAST CORE NEEDLE BIOPSY  COMPARISON: Previous exam(s).  FINDINGS: I met with the patient and we discussed the procedure of ultrasound-guided biopsy, including benefits and alternatives. We discussed the high likelihood of a successful procedure. We discussed the risks of the procedure, including infection, bleeding, tissue injury, clip migration, and inadequate sampling. Informed written consent was given. The usual time-out protocol was performed immediately prior to the procedure.  Lesion quadrant: Upper inner quadrant, and right axilla.  Using sterile technique and 1% Lidocaine as local anesthetic, under direct ultrasound visualization, a 14 gauge spring-loaded device was used to perform biopsy of right breast 1 o'clock mass using a inferior approach. At the conclusion of the procedure a ribbon shaped tissue marker clip was deployed into the biopsy cavity.  Next, using sterile technique and 1% Lidocaine as local anesthetic, under direct ultrasound visualization, a 14 gauge spring-loaded device was used to perform biopsy of an enlarged right axillary lymph node using a inferior approach. At the conclusion of the procedure a spiral shaped HydroMARK tissue marker clip was deployed into the biopsy cavity.  Follow up 2 view mammogram was performed and dictated  separately.  IMPRESSION: Ultrasound guided biopsy of right breast 1 o'clock mass, and abnormal right axillary lymph node. No apparent complications.   Electronically Signed By: Fidela Salisbury M.D. On: 04/17/2018 09:49                                  INFORMATION: 1. FLUORESCENCE IN-SITU HYBRIDIZATION Results: GROUP 4: HER2 **NEGATIVE** Equivocal form of amplification of the HER2 gene was detected in the IHC 2+ tissue sample received from this individual. HER2 FISH was performed by a technologist and cell imaging and analysis on the BioView. First Analysis: RATIO OF HER2/CEN 17 SIGNALS 1.79 AVERAGE HER2 COPY NUMBER PER CELL 4.65 An additional 20 cells were analyzed by FISH. Additional Analysis: RATIO OF HER2/CEN 17 SIGNALS 1.78 AVERAGE HER2 COPY NUMBER PER CELL 4.45 The ratio of HER2/CEN 17 is within the normal range < 2.0 of HER2/CEN 17 and a copy number of HER2 signals per cell is >=4.0 and < 6.0. Arch Pathol Lab Med 1:1,2018 COMMENT: It is uncertain whether patients with >=4.0 and <6.0 average HER2 signals/cell and HER2/CEN 17 ratio <2.0 benefit from HER2 targeted therapy in the absence of protein overexpression (IHC 3+). If the specimen test result is close to the ISH ratio threshold for positive, there is a high likelihood that repeat testing will result in different results by chance alone. Therefore, when [facility] results are not 3+ positive, it is recommended that the sample be considered HER2 negative without additional testing on the same specimen. Vicente Males MD Pathologist, Electronic Signature ( Signed 04/20/2018) 1. PROGNOSTIC INDICATORS 1 of 4 FINAL for Miranda Ayers, Miranda Ayers [phone]) ADDITIONAL INFORMATION:(continued)  Results: IMMUNOHISTOCHEMICAL AND MORPHOMETRIC ANALYSIS PERFORMED MANUALLY The tumor cells are EQUIVOCAL for Her2 (2+). Her2 by FISH will be performed and the results reported separately. Estrogen  Receptor: 80%, POSITIVE, STRONG STAINING INTENSITY Progesterone Receptor: 2%, POSITIVE, MODERATE STAINING INTENSITY Proliferation Marker Ki67: 20% REFERENCE RANGE ESTROGEN RECEPTOR NEGATIVE 0% POSITIVE =>1% REFERENCE RANGE PROGESTERONE RECEPTOR NEGATIVE 0% POSITIVE =>1% All controls stained appropriately Vicente Males MD Pathologist, Electronic Signature ( Signed 04/19/2018) 3. FLUORESCENCE IN-SITU HYBRIDIZATION Results: GROUP 5: HER2 **NEGATIVE** Equivocal form of amplification of the HER2 gene was detected in the IHC 2+ tissue sample received from this individual. HER2 FISH was performed by a technologist and cell imaging and analysis on the BioView. RATIO OF HER2/CEN17 SIGNALS 1.60 AVERAGE HER2 COPY NUMBER PER CELL 3.20 The ratio of HER2/CEN 17 is within the range < 2.0 of HER2/CEN 17 and a copy number of HER2 signals per cell is <4.0. Arch Pathol Lab Med 1:1,2018 Vicente Males MD Pathologist, Electronic Signature ( Signed 04/20/2018) 3. Results: IMMUNOHISTOCHEMICAL AND MORPHOMETRIC ANALYSIS PERFORMED MANUALLY The tumor cells are EQUIVOCAL for Her2 (2+). Her2 by FISH will be performed and the results reported separately. Estrogen Receptor: 50%, POSITIVE, STRONG STAINING INTENSITY Progesterone Receptor: 5%, POSITIVE, STRONG STAINING INTENSITY Proliferation Marker Ki67: 20% 2 of 4 FINAL for Miranda Ayers, Miranda Ayers ([phone]) ADDITIONAL INFORMATION:(continued) REFERENCE RANGE ESTROGEN RECEPTOR NEGATIVE 0% POSITIVE =>1% REFERENCE RANGE PROGESTERONE RECEPTOR NEGATIVE 0% POSITIVE =>1% All controls stained appropriately Vicente Males MD Pathologist, Electronic Signature ( Signed 04/19/2018) FINAL DIAGNOSIS Diagnosis 1. Breast, right, needle core biopsy, 1 o'clock - INVASIVE DUCTAL CARCINOMA WITH CALCIFICATIONS, SEE COMMENT. - DUCTAL CARCINOMA IN SITU. 2. Lymph node, needle/core biopsy, right axilla - METASTATIC CARCINOMA IN ONE LYMPH NODE. 3. Breast, right, needle core  biopsy, lateral calcifications - INVASIVE DUCTAL CARCINOMA WITH CALCIFICATIONS, SEE COMMENT. Microscopic Comment 1. The carcinoma appears grade 3. Prognostic markers will be ordered. Dr. Lyndon Code has reviewed the case. The case was called to [place] on 04/18/2018. 2. The morphology best corresponds with part #3. 3. The carcinoma appears grade 2-3. The morphology is slightly different than part #1. Prognostic markers will be ordered. Dr. Lyndon Code has reviewed the case. The case was called to [facility] on 04/18/2018. Vicente Males MD Pathologist, Electronic Signature (Case signed 04/18/2018) Specimen Gross and Clinical Information Specimen Comment 1. TIF 8 AM; extracted < 2 min; suspicious right breast mass with malignant looking calcifications, involving up to 16cm of tissue 2. TIF 8:30 AM; extracted < 2 min; probably malignant right axillary LN 3. TIF: 9:15 AM; extracted < 5 min Specimen(s) Obtained: 1. Breast, right, needle core biopsy, 1 o'clock 2. Lymph node, needle/core biopsy, right axilla 3. Breast, right, needle core biopsy, lateral calcifications 3 of 4 FINAL for Miranda Ayers, Miranda Ayers [phone]) Specimen Clinical Information 1. Multicentric right breast cancer Gross 1. Received in formalin (TIF 8 a.m., CIT less than 2 minutes) are four cores of soft tan-yellow tissue measuring 0.7 to 1.8 cm in length and each 0.2 cm in diameter. The specimen is submitted in toto. 2. Received in formalin (TIF 8:30 a.m., CIT less than 2 minutes) are three cores of soft tan-yellow tissue measuring 0.4 to 1.0 cm in length and each 0.2 cm in diameter. The specimen is submitted in toto. 3. Received in formalin (TIF 9:15 a.m., CIT less than 5 minutes) are multiple fragments and cores of soft tan-yellow tissue measuring 1.5 x 0.8 x 0.2 cm in aggregate. The specimen is submitted in toto. (  GP:ah 04/17/18 ) Stain(s) used in Diagnosis: The following stain(s)  were used in diagnosing the case: ER-ACIS, PR-ACIS, Her2 FISH, Her2 by IHC, KI-67-ACIS. The control(s) stained appropriately. Disclaimer IHC scores are reported using ASCO/CAP scoring criteria. An IHC Score of 0 or 1+ is NEGATIVE for HER2, 3+ is POSITIVE for HER2, and 2+ is EQUIVOCAL. Equivocal results are reflexed to either FISH or IHC testing. Specimens are fixed in 10% Neutral Buffered Formalin for at least 6 hours and up to 72 hours. These tests have not be validated on decalcified tissue. Results should be interpreted with caution given the possibility of false negative results on decalcified specimens. PR progesterone receptor (16), immunohistochemical stains are performed on formalin fixed, paraffin embedded tissue using a 3,3"-diaminobenzidine (DAB) chromogen and Leica Bond Autostainer System. The staining intensity of the nucleus is scored manually and is reported as the percentage of tumor cell nuclei demonstrating specific nuclear staining.Specimens are fixed in 10% Neutral Buffered Formalin for at least 6 hours and up to 72 hours. These tests have not be validated on decalcified tissue. Results should be interpreted with caution given the possibility of false negative results on decalcified specimens. Ki-67 (MM1), immunohistochemical stains are performed on formalin fixed, paraffin embedded tissue using a 3,3"-diaminobenzidine (DAB) chromogen and Leica Bond Autostainer System. The staining intensity of the nucleus is scored manually and is reported as the percentage of tumor cell nuclei demonstrating specific nuclear staining.Specimens are fixed in 10% Neutral Buffered Formalin for at least 6 hours and up to 72 hours. These tests have not be validated on decalcified tissue. Results should be interpreted with caution.  The patient is a 32 year old female.   Past Surgical History Tawni Pummel, RN; 04/25/2018 7:32 AM) Breast Biopsy Right.  Diagnostic Studies History Tawni Pummel,  RN; 04/25/2018 7:32 AM) Colonoscopy never Mammogram within last year Pap Smear 1-5 years ago  Medication History Tawni Pummel, RN; 04/25/2018 7:32 AM) Medications Reconciled  Social History Tawni Pummel, RN; 04/25/2018 7:32 AM) Alcohol use Occasional alcohol use. Caffeine use Carbonated beverages, Coffee, Tea. No drug use Tobacco use Former smoker.  Family History Tawni Pummel, RN; 04/25/2018 7:32 AM) Alcohol Abuse Family Members In General. Arthritis Family Members In General, Father. Cerebrovascular Accident Family Members In General. Cervical Cancer Family Members In [place] Members In General. Colon Polyps Mother. Diabetes Mellitus Family Members In General, Father. Heart Disease Family Members In General, Mother, Sister. Heart disease in female family member before age 45 Hypertension Family Members In General, Father, Mother. Prostate Cancer Family Members In General.  Pregnancy / Birth History Tawni Pummel, RN; 04/25/2018 7:32 AM) Durenda Age 1 Para 1  Other Problems Tawni Pummel, RN; 04/25/2018 7:32 AM) Back Pain High blood pressure Lump In Breast Migraine Headache Oophorectomy Right. Seizure Disorder     Review of Systems Sunday Spillers Ledford RN; 04/25/2018 7:32 AM) General Present- Weight Loss. Not Present- Appetite Loss, Chills, Fatigue, Fever, Night Sweats and Weight Gain. Skin Present- Dryness. Not Present- Change in Wart/Mole, Hives, Jaundice, New Lesions, Non-Healing Wounds, Rash and Ulcer. HEENT Not Present- Earache, Hearing Loss, Hoarseness, Nose Bleed, Oral Ulcers, Ringing in the Ears, Seasonal Allergies, Sinus Pain, Sore Throat, Visual Disturbances, Wears glasses/contact lenses and Yellow Eyes. Respiratory Not Present- Bloody sputum, Chronic Cough, Difficulty Breathing, Snoring and Wheezing. Breast Present- Breast Mass and Breast Pain. Not Present- Nipple Discharge and Skin Changes. Cardiovascular Not  Present- Chest Pain, Difficulty Breathing Lying Down, Leg Cramps, Palpitations, Rapid Heart Rate, Shortness of Breath and Swelling of  Extremities. Gastrointestinal Present- Change in Bowel Habits, Gets full quickly at meals, Indigestion, Nausea and Vomiting. Not Present- Abdominal Pain, Bloating, Bloody Stool, Chronic diarrhea, Constipation, Difficulty Swallowing, Excessive gas, Hemorrhoids and Rectal Pain. Female Genitourinary Not Present- Frequency, Nocturia, Painful Urination, Pelvic Pain and Urgency. Musculoskeletal Present- Back Pain. Not Present- Joint Pain, Joint Stiffness, Muscle Pain, Muscle Weakness and Swelling of Extremities. Neurological Present- Headaches. Not Present- Decreased Memory, Fainting, Numbness, Seizures, Tingling, Tremor, Trouble walking and Weakness. Psychiatric Not Present- Anxiety, Bipolar, Change in Sleep Pattern, Depression, Fearful and Frequent crying. Endocrine Present- Hot flashes. Not Present- Cold Intolerance, Excessive Hunger, Hair Changes, Heat Intolerance and New Diabetes. Hematology Present- Easy Bruising. Not Present- Blood Thinners, Excessive bleeding, Gland problems, HIV and Persistent Infections.   Physical Exam (Kaelen Caughlin A. Berdella Bacot MD; 04/25/2018 11:56 AM)  General Mental Status-Alert. General Appearance-Consistent with stated age. Hydration-Well hydrated. Voice-Normal.  Eye Eyeball - Bilateral-Extraocular movements intact. Sclera/Conjunctiva - Bilateral-No scleral icterus.  Chest and Lung Exam Chest and lung exam reveals -quiet, even and easy respiratory effort with no use of accessory muscles and on auscultation, normal breath sounds, no adventitious sounds and normal vocal resonance. Inspection Chest Wall - Normal. Back - normal.  Breast Note: large right breast mass 4 cm mobile multiple enlarged LN right axilla  left breast normal  Cardiovascular Cardiovascular examination reveals -normal heart sounds, regular rate and  rhythm with no murmurs and normal pedal pulses bilaterally.  Neurologic Neurologic evaluation reveals -alert and oriented x 3 with no impairment of recent or remote memory. Mental Status-Normal.  Musculoskeletal Normal Exam - Left-Upper Extremity Strength Normal and Lower Extremity Strength Normal. Normal Exam - Right-Upper Extremity Strength Normal and Lower Extremity Strength Normal.  Lymphatic Head & Neck  General Head & Neck Lymphatics: Bilateral - Description - Normal. Axillary  General Axillary Region: Bilateral - Description - Normal. Tenderness - Non Tender.    Assessment & Plan (Anand Tejada A. Adriahna Shearman MD; 04/25/2018 11:58 AM)  CANCER OF OVERLAPPING SITES OF RIGHT FEMALE BREAST (C50.811)   MALIGNANT NEOPLASM OF OVERLAPPING SITES OF RIGHT BREAST IN FEMALE, ESTROGEN RECEPTOR NEGATIVE (C50.811) Impression: Pt will need port for chemotherapy   she will need MRM after treatment due to multiple sites in the same breast and multiple posiitve LN   Pt requires port placement for chemotherapy. Risk include bleeding, infection, pneumothorax, hemothorax, mediastinal injury, nerve injury , blood vessel injury, strke, blood clots, death, migration. embolization and need for additional procedures. Pt agrees to proceed.

## 2018-04-25 NOTE — Progress Notes (Signed)
[facility] NOTE  Patient Care Team: [name], DO as PCP - General (Family Medicine) [name], MD as Consulting Physician (General Surgery) [name], MD as Consulting Physician (Hematology and Oncology) Kyung Rudd, MD as Consulting Physician (Radiation Oncology)  CHIEF COMPLAINTS/PURPOSE OF CONSULTATION:  Newly diagnosed breast cancer  HISTORY OF PRESENTING ILLNESS:  Miranda Ayers 32 y.o. female is here because of recent diagnosis of right breast cancer.  Patient felt a multiple lumps in the right breast and brought it to the attention of her family physician.  She then underwent mammogram and ultrasound and ultrasound-guided biopsies were performed.  Final pathology revealed invasive ductal carcinoma grade 3 that was ER positive PR negative HER-2 negative.  She had 3 abnormal lymph nodes in axillae biopsy were positive.  She is here today accompanied by her mother.  She reports that she has a daughter at home aged 7 years  I reviewed her records extensively and collaborated the history with the patient.  SUMMARY OF ONCOLOGIC HISTORY:   Malignant neoplasm of upper-inner quadrant of right breast in female, estrogen receptor positive ([place])   04/17/2018 Initial Diagnosis    Palpable right breast masses UOQ and UIQ with multiple masses 12 to 14 cm, at 1:00 1.6 cm, 1231.8 cm, and o'clock 4.5 cm and at 9:30 position 5 cm: Biopsy revealed IDC with DCIS, grade 3, ER 80%, PR 0%, HER-2 negative, Ki-67 20%; biopsy of the 4.5 cm mass came back as IDC grade 2-3, ER 50%, PR 5%, HER-2 -2+ ratio 1.6, copy #3.2, Ki-67 20%, T2N1 stage IIb      MEDICAL HISTORY:  Past Medical History:  Diagnosis Date  . Hypertension     SURGICAL HISTORY: Past Surgical History:  Procedure Laterality Date  . OOPHORECTOMY  2013    SOCIAL HISTORY: Social History   Socioeconomic History  . Marital status: Single    Spouse name: Not on file  . Number of  children: Not on file  . Years of education: Not on file  . Highest education level: Not on file  Occupational History  . Not on file  Social Needs  . Financial resource strain: Not on file  . Food insecurity:    Worry: Not on file    Inability: Not on file  . Transportation needs:    Medical: Not on file    Non-medical: Not on file  Tobacco Use  . Smoking status: Never Smoker  . Smokeless tobacco: Never Used  Substance and Sexual Activity  . Alcohol use: Never    Frequency: Never  . Drug use: Never  . Sexual activity: Not on file  Lifestyle  . Physical activity:    Days per week: Not on file    Minutes per session: Not on file  . Stress: Not on file  Relationships  . Social connections:    Talks on phone: Not on file    Gets together: Not on file    Attends religious service: Not on file    Active member of club or organization: Not on file    Attends meetings of clubs or organizations: Not on file    Relationship status: Not on file  . Intimate partner violence:    Fear of current or ex partner: Not on file    Emotionally abused: Not on file    Physically abused: Not on file    Forced sexual activity: Not on file  Other Topics Concern  . Not on file  Social History Narrative  . Not on file    FAMILY HISTORY: Family History  Problem Relation Age of Onset  . Lung cancer Maternal Grandmother   . Lung cancer Maternal Grandfather   . Stomach cancer Paternal Grandfather   . Lung cancer Maternal Aunt   . Stomach cancer Maternal Aunt   . Lung cancer Maternal Uncle     ALLERGIES:  has no allergies on file.  MEDICATIONS:  No current outpatient medications on file.   No current facility-administered medications for this visit.     REVIEW OF SYSTEMS:   Constitutional: Denies fevers, chills or abnormal night sweats Eyes: Denies blurriness of vision, double vision or watery eyes Ears, nose, mouth, throat, and face: Denies mucositis or sore throat Respiratory:  Denies cough, dyspnea or wheezes Cardiovascular: Denies palpitation, chest discomfort or lower extremity swelling Gastrointestinal:  Denies nausea, heartburn or change in bowel habits Skin: Denies abnormal skin rashes Lymphatics: Denies new lymphadenopathy or easy bruising Neurological:Denies numbness, tingling or new weaknesses Behavioral/Psych: Mood is stable, no new changes  Breast: Large palpable lump in the right breast All other systems were reviewed with the patient and are negative.  PHYSICAL EXAMINATION: ECOG PERFORMANCE STATUS: 2 - Symptomatic, <50% confined to bed  Vitals:   04/25/18 0906  BP: (!) 157/99  Pulse: 91  Resp: 17  Temp: 98.4 F (36.9 C)  SpO2: 100%   Filed Weights   04/25/18 0906  Weight: 155 lb 1.6 oz (70.4 kg)    GENERAL:alert, no distress and comfortable SKIN: skin color, texture, turgor are normal, no rashes or significant lesions EYES: normal, conjunctiva are pink and non-injected, sclera clear OROPHARYNX:no exudate, no erythema and lips, buccal mucosa, and tongue normal  NECK: supple, thyroid normal size, non-tender, without nodularity LYMPH:  no palpable lymphadenopathy in the cervical, axillary or inguinal LUNGS: clear to auscultation and percussion with normal breathing effort HEART: regular rate & rhythm and no murmurs and no lower extremity edema ABDOMEN:abdomen soft, non-tender and normal bowel sounds Musculoskeletal:no cyanosis of digits and no clubbing  PSYCH: alert & oriented x 3 with fluent speech NEURO: no focal motor/sensory deficits BREAST: 11 x 10 cm mass no palpable axillary or supraclavicular lymphadenopathy (exam performed in the presence of a chaperone)   LABORATORY DATA:  I have reviewed the data as listed Lab Results  Component Value Date   WBC 4.4 04/25/2018   HGB 13.5 04/25/2018   HCT 42.3 04/25/2018   MCV 86.7 04/25/2018   PLT 249 04/25/2018   Lab Results  Component Value Date   NA 140 04/25/2018   K 4.1  04/25/2018   CL 106 04/25/2018   CO2 25 04/25/2018    RADIOGRAPHIC STUDIES: I have personally reviewed the radiological reports and agreed with the findings in the report.  ASSESSMENT AND PLAN:  Malignant neoplasm of upper-inner quadrant of right breast in female, estrogen receptor positive ([place]) 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%, PR 0%, HER-2 negative, Ki-67 20%; biopsy of the 4.5 cm mass came back as IDC grade 2-3, ER 50%, PR 5%, HER-2 -2+ ratio 1.6, copy #3.2, Ki-67 20%, T2N1 stage IIb  Pathology and radiology counseling: Discussed with the patient, the details of pathology including the type of breast cancer,the clinical staging, the significance of ER, PR and HER-2/neu receptors and the  implications for treatment. After reviewing the pathology in detail, we proceeded to discuss the different treatment options between surgery, radiation, chemotherapy, antiestrogen therapies.  Recommendation: 1.  Neoadjuvant chemotherapy with dose dense Adriamycin and Cytoxan x4 followed by Taxol weekly x12 2. followed by Bil mastectomy and axillary lymph node dissection 3.  Followed by radiation 4.  Followed by adjuvant antiestrogen therapy ----------------------------------------------------------- Plan: Staging studies, echocardiogram, chemo class, port placement, breast MRI, genetics will be ordered.   All questions were answered. The patient knows to call the clinic with any problems, questions or concerns.    Harriette Ohara, MD 04/25/18

## 2018-04-25 NOTE — Assessment & Plan Note (Signed)
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%, PR 0%, HER-2 negative, Ki-67 20%; biopsy of the 4.5 cm mass came back as IDC grade 2-3, ER 50%, PR 5%, HER-2 -2+ ratio 1.6, copy #3.2, Ki-67 20%, T2N1 stage IIb  Pathology and radiology counseling: Discussed with the patient, the details of pathology including the type of breast cancer,the clinical staging, the significance of ER, PR and HER-2/neu receptors and the implications for treatment. After reviewing the pathology in detail, we proceeded to discuss the different treatment options between surgery, radiation, chemotherapy, antiestrogen therapies.  Recommendation: 1.  Neoadjuvant chemotherapy with dose dense edematous and Cytoxan x4 followed by Taxol weekly x12 2. followed by mastectomy and axillary lymph node dissection 3.  Followed by radiation 4.  Followed by adjuvant antiestrogen therapy ----------------------------------------------------------- Plan: Staging studies, echocardiogram, chemo class, port placement, breast MRI, genetics will be ordered.

## 2018-04-25 NOTE — Progress Notes (Signed)
START ON PATHWAY REGIMEN - Breast   Dose-Dense AC q14 days:   A cycle is every 14 days:     Doxorubicin      Cyclophosphamide      Pegfilgrastim-xxxx   **Always confirm dose/schedule in your pharmacy ordering system**  Paclitaxel 80 mg/m2 Weekly:   Administer weekly:     Paclitaxel   **Always confirm dose/schedule in your pharmacy ordering system**  Patient Characteristics: Preoperative or Nonsurgical Candidate (Clinical Staging), Neoadjuvant Therapy followed by Surgery, Invasive Disease, Chemotherapy, HER2 Negative/Unknown/Equivocal, ER Positive Therapeutic Status: Preoperative or Nonsurgical Candidate (Clinical Staging) AJCC M Category: cM0 AJCC Grade: G3 Breast Surgical Plan: Neoadjuvant Therapy followed by Surgery ER Status: Positive (+) AJCC 8 Stage Grouping: IIIA HER2 Status: Negative (-) AJCC T Category: cT2 AJCC N Category: cN1 PR Status: Negative (-) Intent of Therapy: Curative Intent, Discussed with Patient 

## 2018-04-25 NOTE — Therapy (Signed)
[place], [place], [address] Phone: [phone]   Fax:  [phone]  Physical Therapy Evaluation  Patient Details  Name: Miranda Ayers MRN: [id number] Date of Birth: Nov 13, 1985 Referring Provider (PT): Dr. [name]   Encounter Date: 04/25/2018  PT End of Session - 04/25/18 1106    Visit Number  1    Number of Visits  1    PT Start Time  8828    PT Stop Time  0955   Also saw pt from 1005-1015 and [id number] for a total of 30 minutes   PT Time Calculation (min)  14 min    Activity Tolerance  Patient tolerated treatment well    Behavior During Therapy  [facility] for tasks assessed/performed       Past Medical History:  Diagnosis Date  . Hypertension     Past Surgical History:  Procedure Laterality Date  . OOPHORECTOMY  2013    There were no vitals filed for this visit.   Subjective Assessment - 04/25/18 1001    Subjective  Patient reports she is here today to be seen by her medical team for her newly diagnosed right breast cancer.    Patient is accompained by:  Family member    Pertinent History  Patient was diagnosed on 04/03/18 with right grade III invasive ductal carcinoma breast cancer. There are multiple masses in her breast measuring 12-14 cm total in the upper outer and inner quadrants. It is ER/PR positive and HER2 negative with a Ki67 of 20% She has 3 abnormal appearing axillary nodes. One was biopsied and found to be positive.    Patient Stated Goals  Reduce lymphedema risk and learn post op shoulder ROM HEP    Currently in Pain?  No/denies         [facility] PT Assessment - 04/25/18 0001      Assessment   Medical Diagnosis  Right breast cancer    Referring Provider (PT)  Dr. Marcello Moores Cornett    Onset Date/Surgical Date  04/03/18    Hand Dominance  Right    Prior Therapy  none      Precautions   Precautions  Other (comment)    Precaution Comments  active cancer      Restrictions   Weight Bearing Restrictions  No      Balance Screen   Has the patient fallen in the past 6 months  No    Has the patient had a decrease in activity level because of a fear of falling?   No    Is the patient reluctant to leave their home because of a fear of falling?   No      Home Environment   Living Environment  Private residence    Living Arrangements  Children;Parent   Mom and 12 y.o. daughter   Available Help at Discharge  Family      Prior Function   Level of Independence  Independent    Vocation  Full time employment    Mudlogger at [employer]; lifts heavy boxes of frozen bread    Leisure  She does not exercise      Cognition   Overall Cognitive Status  Within Functional Limits for tasks assessed      Posture/Postural Control   Posture/Postural Control  Postural limitations    Postural Limitations  Rounded Shoulders;Forward head      ROM / Strength   AROM / PROM / Strength  AROM;Strength  AROM   AROM Assessment Site  Shoulder;Cervical    Right/Left Shoulder  Right;Left    Right Shoulder Extension  64 Degrees    Right Shoulder Flexion  158 Degrees    Right Shoulder ABduction  174 Degrees    Right Shoulder Internal Rotation  59 Degrees    Right Shoulder External Rotation  80 Degrees    Left Shoulder ABduction  48 Degrees    Left Shoulder Internal Rotation  69 Degrees    Left Shoulder External Rotation  163 Degrees    Left Shoulder Horizontal ABduction  69 Degrees    Left Shoulder Horizontal ADduction  75 Degrees    Cervical Flexion  WNL    Cervical Extension  WNL    Cervical - Right Side Bend  WNL    Cervical - Left Side Bend  WNL    Cervical - Right Rotation  WNL    Cervical - Left Rotation  WNL      Strength   Overall Strength  Within functional limits for tasks performed        LYMPHEDEMA/ONCOLOGY QUESTIONNAIRE - 04/25/18 1102      Type   Cancer Type  Right breast cancer      Lymphedema Assessments   Lymphedema  Assessments  Upper extremities      Right Upper Extremity Lymphedema   10 cm Proximal to Olecranon Process  27.7 cm    Olecranon Process  23.7 cm    10 cm Proximal to Ulnar Styloid Process  23.2 cm    Just Proximal to Ulnar Styloid Process  15.2 cm    Across Hand at [employer]  18 cm    At [place] of 2nd Digit  5.8 cm      Left Upper Extremity Lymphedema   10 cm Proximal to Olecranon Process  28 cm    Olecranon Process  22.8 cm    10 cm Proximal to Ulnar Styloid Process  20.2 cm    Just Proximal to Ulnar Styloid Process  14.8 cm    Across Hand at [employer]  18 cm    At [place] of 2nd Digit  5.3 cm             Objective measurements completed on examination: See above findings.        Patient was instructed today in a home exercise program today for post op shoulder range of motion. These included active assist shoulder flexion in sitting, scapular retraction, wall walking with shoulder abduction, and hands behind head external rotation.  She was encouraged to do these twice a day, holding 3 seconds and repeating 5 times when permitted by her physician.          PT Education - 04/25/18 1104    Education Details  Lymphedema risk reduction and post op shoulder ROM HEP    Person(s) Educated  Patient;Parent(s)    Methods  Explanation;Demonstration;Handout    Comprehension  Returned demonstration;Verbalized understanding           Breast Clinic Goals - 04/25/18 1116      Patient will be able to verbalize understanding of pertinent lymphedema risk reduction practices relevant to her diagnosis specifically related to skin care.   Time  1    Status  Achieved      Patient will be able to return demonstrate and/or verbalize understanding of the post-op home exercise program related to regaining shoulder range of motion.   Time  1  Period  Days    Status  Achieved      Patient will be able to verbalize understanding of the importance of attending the  postoperative After Breast Cancer Class for further lymphedema risk reduction education and therapeutic exercise.   Time  1    Period  Days    Status  Achieved            Plan - 04/25/18 1107    Clinical Impression Statement  Patient was diagnosed on 04/03/18 with right grade III invasive ductal carcinoma breast cancer. There are multiple masses in her breast measuring 12-14 cm total in the upper outer and inner quadrants. It is ER/PR positive and HER2 negative with a Ki67 of 20% She has 3 abnormal appearing axillary nodes. One was biopsied and found to be positive. Her multidisciplinary medical team met prior to her assessments to determine a recommended treatment plan. She is planning to have neoadjuvant chemotherapy followed by a bilateral mastectomy and right axillary lymph node dissection followed by radiation and anti-estrogen therapy. She will benefit from post op PT to regain shoulder ROM and reduce her risk of lymphedema.    History and Personal Factors relevant to plan of care:  Single Mom    Clinical Presentation  Evolving    Clinical Presentation due to:  Unknown extent of disease until staging scans are performed    Clinical Decision Making  Low    Rehab Potential  Excellent    Clinical Impairments Affecting Rehab Potential  None    PT Frequency  One time visit    PT Treatment/Interventions  ADLs/Self Care Home Management;Therapeutic exercise;Patient/family education    PT Next Visit Plan  Will reassess post op if MD refers    PT Home Exercise Plan  Post op shoulder ROM HEP    Consulted and Agree with Plan of Care  Patient;Family member/caregiver    Family Member Consulted  Mom       Patient will benefit from skilled therapeutic intervention in order to improve the following deficits and impairments:  Decreased knowledge of precautions, Impaired UE functional use, Pain, Postural dysfunction, Decreased range of motion  Visit Diagnosis: Malignant neoplasm of upper-inner  quadrant of right breast in female, estrogen receptor positive ([place]) - Plan: PT plan of care cert/re-cert  Abnormal posture - Plan: PT plan of care cert/re-cert   Patient will follow up at outpatient cancer rehab 3-4 weeks following surgery.  If the patient requires physical therapy at that time, a specific plan will be dictated and sent to the referring physician for approval. The patient was educated today on appropriate basic range of motion exercises to begin post operatively and the importance of attending the After Breast Cancer class following surgery.  Patient was educated today on lymphedema risk reduction practices as it pertains to recommendations that will benefit the patient immediately following surgery.  She verbalized good understanding.     Problem List Patient Active Problem List   Diagnosis Date Noted  . Malignant neoplasm of upper-inner quadrant of right breast in female, estrogen receptor positive ([place]) 04/19/2018   Annia Friendly, PT 04/25/18 11:18 AM  [place], [place], [address] Phone: [phone]   Fax:  [phone]  Name: Dawson Hollman MRN: [id number] Date of Birth: 27-Oct-1985

## 2018-04-25 NOTE — Progress Notes (Signed)
Nutrition Assessment  Reason for Assessment:  Pt seen in Breast Clinic  ASSESSMENT:   33 year old female with new diagnosis of breast cancer.  Past medical history of HTN.    Patient reports typically just eats 1-2 meal per day, skips breakfast  Medications:  reviewed  Labs: reviewed  Anthropometrics:   Height: 61 inches Weight: 155 lb 1.6 oz BMI: 29   NUTRITION DIAGNOSIS: Food and nutrition related knowledge deficit related to new diagnosis of breast cancer as evidenced by no prior need for nutrition related information.  INTERVENTION:   Discussed and provided packet of information regarding nutritional tips for breast cancer patients.  Questions answered.  Teachback method used.  Contact information provided and patient knows to contact me with questions/concerns.    MONITORING, EVALUATION, and GOAL: Pt will consume a healthy plant based diet to maintain lean body mass throughout treatment.   Margel Joens B. Zenia Resides, [place], [place] Registered Dietitian [phone] (pager)

## 2018-04-30 ENCOUNTER — Telehealth: Payer: Self-pay | Admitting: Adult Health

## 2018-04-30 ENCOUNTER — Encounter (HOSPITAL_BASED_OUTPATIENT_CLINIC_OR_DEPARTMENT_OTHER): Payer: Self-pay

## 2018-04-30 ENCOUNTER — Other Ambulatory Visit: Payer: Self-pay

## 2018-04-30 NOTE — Telephone Encounter (Signed)
Called [facility] as requested to do peer to peer on patient CT abdomen and pelvis, along with echocardiogram.  Scans were already approved, with approval # [id number] from 04/25/2018 through 06/23/2018. Message sent to team with the details.  Wilber Bihari, NP  Time spent on phone: 5 minutes

## 2018-05-01 ENCOUNTER — Ambulatory Visit (HOSPITAL_BASED_OUTPATIENT_CLINIC_OR_DEPARTMENT_OTHER)
Admission: RE | Admit: 2018-05-01 | Discharge: 2018-05-01 | Disposition: A | Discharge status: Home / Self Care | Payer: BLUE CROSS/BLUE SHIELD | Source: Ambulatory Visit | Attending: Hematology and Oncology | Admitting: Hematology and Oncology

## 2018-05-01 ENCOUNTER — Ambulatory Visit (HOSPITAL_COMMUNITY)
Admission: RE | Admit: 2018-05-01 | Discharge: 2018-05-01 | Disposition: A | Discharge status: Home / Self Care | Payer: BLUE CROSS/BLUE SHIELD | Source: Ambulatory Visit | Attending: Hematology and Oncology | Admitting: Hematology and Oncology

## 2018-05-01 ENCOUNTER — Inpatient Hospital Stay: Payer: BLUE CROSS/BLUE SHIELD

## 2018-05-01 ENCOUNTER — Encounter (HOSPITAL_BASED_OUTPATIENT_CLINIC_OR_DEPARTMENT_OTHER)
Admission: RE | Admit: 2018-05-01 | Discharge: 2018-05-01 | Disposition: A | Discharge status: Home / Self Care | Payer: BLUE CROSS/BLUE SHIELD | Source: Ambulatory Visit | Attending: Surgery | Admitting: Surgery

## 2018-05-01 DIAGNOSIS — Z01810 Encounter for preprocedural cardiovascular examination: Secondary | ICD-10-CM | POA: Diagnosis not present

## 2018-05-01 DIAGNOSIS — I10 Essential (primary) hypertension: Secondary | ICD-10-CM | POA: Insufficient documentation

## 2018-05-01 DIAGNOSIS — Z170 Estrogen receptor positive status [ER+]: Secondary | ICD-10-CM | POA: Insufficient documentation

## 2018-05-01 DIAGNOSIS — C50211 Malignant neoplasm of upper-inner quadrant of right female breast: Secondary | ICD-10-CM | POA: Diagnosis present

## 2018-05-01 MED ORDER — GADOBUTROL 1 MMOL/ML IV SOLN
7.5000 mL | Freq: Once | INTRAVENOUS | Status: AC | PRN
  Administered 2018-05-01: 7 mL via INTRAVENOUS

## 2018-05-01 NOTE — Progress Notes (Signed)
Ensure pre surgery drink given with instructions to complete by 0730 dos, pt verbalized understanding. 

## 2018-05-01 NOTE — Progress Notes (Signed)
  Echocardiogram 2D Echocardiogram has been performed.  Jennette Dubin 05/01/2018, 1:41 PM

## 2018-05-03 ENCOUNTER — Telehealth: Payer: Self-pay | Admitting: *Deleted

## 2018-05-03 NOTE — Telephone Encounter (Signed)
Spoke to pt concerning [facility] from 11.6.19. Denies questions or concerns regarding dx or treatment care plan. Discussed future appts. Encourage pt to call with needs. Received verbal understanding.

## 2018-05-04 ENCOUNTER — Inpatient Hospital Stay: Payer: BLUE CROSS/BLUE SHIELD

## 2018-05-08 ENCOUNTER — Ambulatory Visit (HOSPITAL_BASED_OUTPATIENT_CLINIC_OR_DEPARTMENT_OTHER): Payer: BLUE CROSS/BLUE SHIELD | Admitting: Anesthesiology

## 2018-05-08 ENCOUNTER — Encounter (HOSPITAL_BASED_OUTPATIENT_CLINIC_OR_DEPARTMENT_OTHER)
Admission: RE | Disposition: A | Discharge status: Home / Self Care | Payer: Self-pay | Source: Ambulatory Visit | Attending: Surgery

## 2018-05-08 ENCOUNTER — Encounter (HOSPITAL_BASED_OUTPATIENT_CLINIC_OR_DEPARTMENT_OTHER): Payer: Self-pay

## 2018-05-08 ENCOUNTER — Ambulatory Visit (HOSPITAL_COMMUNITY): Payer: BLUE CROSS/BLUE SHIELD

## 2018-05-08 ENCOUNTER — Other Ambulatory Visit: Payer: Self-pay

## 2018-05-08 ENCOUNTER — Ambulatory Visit (HOSPITAL_BASED_OUTPATIENT_CLINIC_OR_DEPARTMENT_OTHER)
Admission: RE | Admit: 2018-05-08 | Discharge: 2018-05-08 | Disposition: A | Discharge status: Home / Self Care | Payer: BLUE CROSS/BLUE SHIELD | Source: Ambulatory Visit | Attending: Surgery | Admitting: Surgery

## 2018-05-08 ENCOUNTER — Other Ambulatory Visit (HOSPITAL_COMMUNITY): Payer: BLUE CROSS/BLUE SHIELD

## 2018-05-08 ENCOUNTER — Encounter (HOSPITAL_COMMUNITY): Payer: BLUE CROSS/BLUE SHIELD

## 2018-05-08 DIAGNOSIS — Z419 Encounter for procedure for purposes other than remedying health state, unspecified: Secondary | ICD-10-CM

## 2018-05-08 DIAGNOSIS — C773 Secondary and unspecified malignant neoplasm of axilla and upper limb lymph nodes: Secondary | ICD-10-CM | POA: Diagnosis not present

## 2018-05-08 DIAGNOSIS — C50811 Malignant neoplasm of overlapping sites of right female breast: Secondary | ICD-10-CM | POA: Insufficient documentation

## 2018-05-08 DIAGNOSIS — Z171 Estrogen receptor negative status [ER-]: Secondary | ICD-10-CM | POA: Diagnosis not present

## 2018-05-08 DIAGNOSIS — Z8049 Family history of malignant neoplasm of other genital organs: Secondary | ICD-10-CM | POA: Diagnosis not present

## 2018-05-08 DIAGNOSIS — Z87891 Personal history of nicotine dependence: Secondary | ICD-10-CM | POA: Insufficient documentation

## 2018-05-08 DIAGNOSIS — I10 Essential (primary) hypertension: Secondary | ICD-10-CM | POA: Diagnosis not present

## 2018-05-08 DIAGNOSIS — Z95828 Presence of other vascular implants and grafts: Secondary | ICD-10-CM

## 2018-05-08 HISTORY — DX: Malignant (primary) neoplasm, unspecified: C80.1

## 2018-05-08 HISTORY — PX: PORTACATH PLACEMENT: SHX2246

## 2018-05-08 LAB — POCT PREGNANCY, URINE: PREG TEST UR: NEGATIVE

## 2018-05-08 SURGERY — INSERTION, TUNNELED CENTRAL VENOUS DEVICE, WITH PORT
Anesthesia: General | Site: Chest

## 2018-05-08 MED ORDER — LIDOCAINE 2% (20 MG/ML) 5 ML SYRINGE
INTRAMUSCULAR | Status: AC
  Filled 2018-05-08: qty 5

## 2018-05-08 MED ORDER — GABAPENTIN 300 MG PO CAPS
300.0000 mg | ORAL_CAPSULE | ORAL | Status: AC
  Administered 2018-05-08: 300 mg via ORAL

## 2018-05-08 MED ORDER — DEXTROSE 5 % IV SOLN
3.0000 g | INTRAVENOUS | Status: AC
  Administered 2018-05-08: 2 g via INTRAVENOUS

## 2018-05-08 MED ORDER — HEPARIN (PORCINE) IN NACL 1000-0.9 UT/500ML-% IV SOLN
INTRAVENOUS | Status: AC
  Filled 2018-05-08: qty 500

## 2018-05-08 MED ORDER — CEFAZOLIN SODIUM-DEXTROSE 2-4 GM/100ML-% IV SOLN
INTRAVENOUS | Status: AC
  Filled 2018-05-08: qty 100

## 2018-05-08 MED ORDER — HEPARIN (PORCINE) IN NACL 2-0.9 UNITS/ML
INTRAMUSCULAR | Status: AC | PRN
  Administered 2018-05-08: 20 mL

## 2018-05-08 MED ORDER — IOPAMIDOL (ISOVUE-300) INJECTION 61%
INTRAVENOUS | Status: AC
  Filled 2018-05-08: qty 50

## 2018-05-08 MED ORDER — SCOPOLAMINE 1 MG/3DAYS TD PT72: 1.0000 | MEDICATED_PATCH | Freq: Once | TRANSDERMAL | Status: DC | PRN

## 2018-05-08 MED ORDER — ACETAMINOPHEN 500 MG PO TABS
ORAL_TABLET | ORAL | Status: AC
  Filled 2018-05-08: qty 2

## 2018-05-08 MED ORDER — EPHEDRINE 5 MG/ML INJ
INTRAVENOUS | Status: AC
  Filled 2018-05-08: qty 10

## 2018-05-08 MED ORDER — CELECOXIB 200 MG PO CAPS
200.0000 mg | ORAL_CAPSULE | ORAL | Status: AC
  Administered 2018-05-08: 200 mg via ORAL

## 2018-05-08 MED ORDER — ONDANSETRON HCL 4 MG/2ML IJ SOLN
INTRAMUSCULAR | Status: AC
  Filled 2018-05-08: qty 2

## 2018-05-08 MED ORDER — PROPOFOL 10 MG/ML IV BOLUS
INTRAVENOUS | Status: DC | PRN
  Administered 2018-05-08: 200 mg via INTRAVENOUS

## 2018-05-08 MED ORDER — EPHEDRINE SULFATE 50 MG/ML IJ SOLN
INTRAMUSCULAR | Status: DC | PRN
  Administered 2018-05-08 (×2): 10 mg via INTRAVENOUS

## 2018-05-08 MED ORDER — BUPIVACAINE HCL (PF) 0.25 % IJ SOLN
INTRAMUSCULAR | Status: AC
  Filled 2018-05-08: qty 30

## 2018-05-08 MED ORDER — LIDOCAINE-EPINEPHRINE (PF) 1 %-1:200000 IJ SOLN
INTRAMUSCULAR | Status: AC
  Filled 2018-05-08: qty 30

## 2018-05-08 MED ORDER — CHLORHEXIDINE GLUCONATE CLOTH 2 % EX PADS: 6.0000 | MEDICATED_PAD | Freq: Once | CUTANEOUS | Status: DC

## 2018-05-08 MED ORDER — BUPIVACAINE-EPINEPHRINE (PF) 0.25% -1:200000 IJ SOLN
INTRAMUSCULAR | Status: AC
  Filled 2018-05-08: qty 30

## 2018-05-08 MED ORDER — FENTANYL CITRATE (PF) 100 MCG/2ML IJ SOLN: 50.0000 ug | INTRAMUSCULAR | Status: DC | PRN

## 2018-05-08 MED ORDER — FENTANYL CITRATE (PF) 100 MCG/2ML IJ SOLN
INTRAMUSCULAR | Status: DC | PRN
  Administered 2018-05-08: 50 ug via INTRAVENOUS
  Administered 2018-05-08 (×2): 25 ug via INTRAVENOUS

## 2018-05-08 MED ORDER — MIDAZOLAM HCL 2 MG/2ML IJ SOLN
INTRAMUSCULAR | Status: AC
  Filled 2018-05-08: qty 2

## 2018-05-08 MED ORDER — PROMETHAZINE HCL 25 MG/ML IJ SOLN: 6.2500 mg | INTRAMUSCULAR | Status: DC | PRN

## 2018-05-08 MED ORDER — FENTANYL CITRATE (PF) 100 MCG/2ML IJ SOLN: 25.0000 ug | INTRAMUSCULAR | Status: DC | PRN

## 2018-05-08 MED ORDER — GLYCOPYRROLATE 0.2 MG/ML IJ SOLN: INTRAMUSCULAR | Status: DC | PRN

## 2018-05-08 MED ORDER — BUPIVACAINE-EPINEPHRINE 0.25% -1:200000 IJ SOLN
INTRAMUSCULAR | Status: DC | PRN
  Administered 2018-05-08: 13 mL

## 2018-05-08 MED ORDER — OXYCODONE HCL 5 MG PO TABS: 5.0000 mg | ORAL_TABLET | Freq: Four times a day (QID) | ORAL | 0 refills | Status: DC | PRN

## 2018-05-08 MED ORDER — ONDANSETRON HCL 4 MG/2ML IJ SOLN
INTRAMUSCULAR | Status: DC | PRN
  Administered 2018-05-08: 4 mg via INTRAVENOUS

## 2018-05-08 MED ORDER — GLYCOPYRROLATE PF 0.2 MG/ML IJ SOSY
PREFILLED_SYRINGE | INTRAMUSCULAR | Status: AC
  Filled 2018-05-08: qty 1

## 2018-05-08 MED ORDER — HEPARIN SOD (PORK) LOCK FLUSH 100 UNIT/ML IV SOLN
INTRAVENOUS | Status: AC
  Filled 2018-05-08: qty 5

## 2018-05-08 MED ORDER — GABAPENTIN 300 MG PO CAPS
ORAL_CAPSULE | ORAL | Status: AC
  Filled 2018-05-08: qty 1

## 2018-05-08 MED ORDER — MIDAZOLAM HCL 2 MG/2ML IJ SOLN: 1.0000 mg | INTRAMUSCULAR | Status: DC | PRN

## 2018-05-08 MED ORDER — DEXAMETHASONE SODIUM PHOSPHATE 10 MG/ML IJ SOLN
INTRAMUSCULAR | Status: AC
  Filled 2018-05-08: qty 1

## 2018-05-08 MED ORDER — DEXAMETHASONE SODIUM PHOSPHATE 4 MG/ML IJ SOLN
INTRAMUSCULAR | Status: DC | PRN
  Administered 2018-05-08: 10 mg via INTRAVENOUS

## 2018-05-08 MED ORDER — MEPERIDINE HCL 25 MG/ML IJ SOLN: 6.2500 mg | INTRAMUSCULAR | Status: DC | PRN

## 2018-05-08 MED ORDER — MIDAZOLAM HCL 5 MG/5ML IJ SOLN
INTRAMUSCULAR | Status: DC | PRN
  Administered 2018-05-08 (×2): 1 mg via INTRAVENOUS

## 2018-05-08 MED ORDER — LACTATED RINGERS IV SOLN
INTRAVENOUS | Status: DC
  Administered 2018-05-08: 10:00:00 via INTRAVENOUS

## 2018-05-08 MED ORDER — FENTANYL CITRATE (PF) 100 MCG/2ML IJ SOLN
INTRAMUSCULAR | Status: AC
  Filled 2018-05-08: qty 2

## 2018-05-08 MED ORDER — ACETAMINOPHEN 500 MG PO TABS
1000.0000 mg | ORAL_TABLET | ORAL | Status: AC
  Administered 2018-05-08: 1000 mg via ORAL

## 2018-05-08 MED ORDER — PROPOFOL 10 MG/ML IV BOLUS
INTRAVENOUS | Status: AC
  Filled 2018-05-08: qty 20

## 2018-05-08 MED ORDER — HEPARIN SOD (PORK) LOCK FLUSH 100 UNIT/ML IV SOLN
INTRAVENOUS | Status: DC | PRN
  Administered 2018-05-08: 500 [IU]

## 2018-05-08 MED ORDER — GLYCOPYRROLATE 0.2 MG/ML IJ SOLN
INTRAMUSCULAR | Status: DC | PRN
  Administered 2018-05-08 (×2): 0.1 mg via INTRAVENOUS

## 2018-05-08 MED ORDER — LIDOCAINE HCL (CARDIAC) PF 100 MG/5ML IV SOSY
PREFILLED_SYRINGE | INTRAVENOUS | Status: DC | PRN
  Administered 2018-05-08: 50 mg via INTRAVENOUS

## 2018-05-08 MED ORDER — CELECOXIB 200 MG PO CAPS
ORAL_CAPSULE | ORAL | Status: AC
  Filled 2018-05-08: qty 1

## 2018-05-08 MED ORDER — OXYCODONE HCL 5 MG/5ML PO SOLN: 5.0000 mg | Freq: Once | ORAL | Status: DC | PRN

## 2018-05-08 MED ORDER — IBUPROFEN 800 MG PO TABS: 800.0000 mg | ORAL_TABLET | Freq: Three times a day (TID) | ORAL | 0 refills | Status: DC | PRN

## 2018-05-08 MED ORDER — OXYCODONE HCL 5 MG PO TABS: 5.0000 mg | ORAL_TABLET | Freq: Once | ORAL | Status: DC | PRN

## 2018-05-08 SURGICAL SUPPLY — 61 items
BAG DECANTER FOR FLEXI CONT (MISCELLANEOUS) ×3 IMPLANT
BENZOIN TINCTURE PRP APPL 2/3 (GAUZE/BANDAGES/DRESSINGS) IMPLANT
BLADE HEX COATED 2.75 (ELECTRODE) ×3 IMPLANT
BLADE SURG 11 STRL SS (BLADE) ×3 IMPLANT
BLADE SURG 15 STRL LF DISP TIS (BLADE) ×1 IMPLANT
BLADE SURG 15 STRL SS (BLADE) ×2
CANISTER SUCT 1200ML W/VALVE (MISCELLANEOUS) IMPLANT
CHLORAPREP W/TINT 26ML (MISCELLANEOUS) ×3 IMPLANT
CLOSURE WOUND 1/2 X4 (GAUZE/BANDAGES/DRESSINGS)
COVER BACK TABLE 60X90IN (DRAPES) ×3 IMPLANT
COVER MAYO STAND STRL (DRAPES) ×3 IMPLANT
COVER PROBE 5X48 (MISCELLANEOUS) ×2
COVER WAND RF STERILE (DRAPES) IMPLANT
DECANTER SPIKE VIAL GLASS SM (MISCELLANEOUS) IMPLANT
DERMABOND ADVANCED (GAUZE/BANDAGES/DRESSINGS) ×2
DERMABOND ADVANCED .7 DNX12 (GAUZE/BANDAGES/DRESSINGS) ×1 IMPLANT
DRAPE C-ARM 42X72 X-RAY (DRAPES) ×3 IMPLANT
DRAPE LAPAROSCOPIC ABDOMINAL (DRAPES) ×3 IMPLANT
DRAPE UTILITY XL STRL (DRAPES) ×3 IMPLANT
DRSG TEGADERM 2-3/8X2-3/4 SM (GAUZE/BANDAGES/DRESSINGS) IMPLANT
DRSG TEGADERM 4X4.75 (GAUZE/BANDAGES/DRESSINGS) IMPLANT
ELECT REM PT RETURN 9FT ADLT (ELECTROSURGICAL) ×3
ELECTRODE REM PT RTRN 9FT ADLT (ELECTROSURGICAL) ×1 IMPLANT
GAUZE SPONGE 4X4 12PLY STRL LF (GAUZE/BANDAGES/DRESSINGS) IMPLANT
GLOVE BIO SURGEON STRL SZ 6.5 (GLOVE) ×2 IMPLANT
GLOVE BIO SURGEONS STRL SZ 6.5 (GLOVE) ×1
GLOVE BIOGEL PI IND STRL 7.0 (GLOVE) ×2 IMPLANT
GLOVE BIOGEL PI IND STRL 8 (GLOVE) ×1 IMPLANT
GLOVE BIOGEL PI INDICATOR 7.0 (GLOVE) ×4
GLOVE BIOGEL PI INDICATOR 8 (GLOVE) ×2
GLOVE ECLIPSE 8.0 STRL XLNG CF (GLOVE) ×3 IMPLANT
GOWN STRL REUS W/ TWL LRG LVL3 (GOWN DISPOSABLE) ×2 IMPLANT
GOWN STRL REUS W/TWL LRG LVL3 (GOWN DISPOSABLE) ×4
IV KIT MINILOC 20X1 SAFETY (NEEDLE) IMPLANT
KIT CVR 48X5XPRB PLUP LF (MISCELLANEOUS) ×1 IMPLANT
KIT PORT POWER 8FR ISP CVUE (Port) ×3 IMPLANT
NDL SAFETY ECLIPSE 18X1.5 (NEEDLE) IMPLANT
NEEDLE HYPO 18GX1.5 SHARP (NEEDLE)
NEEDLE HYPO 22GX1.5 SAFETY (NEEDLE) IMPLANT
NEEDLE HYPO 25X1 1.5 SAFETY (NEEDLE) ×3 IMPLANT
NEEDLE SPNL 22GX3.5 QUINCKE BK (NEEDLE) IMPLANT
PACK BASIN DAY SURGERY FS (CUSTOM PROCEDURE TRAY) ×3 IMPLANT
PENCIL BUTTON HOLSTER BLD 10FT (ELECTRODE) ×3 IMPLANT
SET SHEATH INTRODUCER 10FR (MISCELLANEOUS) IMPLANT
SHEATH COOK PEEL AWAY SET 9F (SHEATH) IMPLANT
SLEEVE SCD COMPRESS KNEE MED (MISCELLANEOUS) ×3 IMPLANT
SPONGE LAP 4X18 RFD (DISPOSABLE) IMPLANT
STRIP CLOSURE SKIN 1/2X4 (GAUZE/BANDAGES/DRESSINGS) IMPLANT
SUT MON AB 4-0 PC3 18 (SUTURE) ×3 IMPLANT
SUT PROLENE 2 0 CT2 30 (SUTURE) IMPLANT
SUT PROLENE 2 0 SH DA (SUTURE) ×3 IMPLANT
SUT SILK 2 0 TIES 17X18 (SUTURE)
SUT SILK 2-0 18XBRD TIE BLK (SUTURE) IMPLANT
SUT VICRYL 3-0 CR8 SH (SUTURE) ×3 IMPLANT
SYR 5ML LUER SLIP (SYRINGE) ×3 IMPLANT
SYR CONTROL 10ML LL (SYRINGE) ×3 IMPLANT
TOWEL GREEN STERILE FF (TOWEL DISPOSABLE) ×3 IMPLANT
TOWEL OR NON WOVEN STRL DISP B (DISPOSABLE) IMPLANT
TUBE CONNECTING 20'X1/4 (TUBING)
TUBE CONNECTING 20X1/4 (TUBING) IMPLANT
YANKAUER SUCT BULB TIP NO VENT (SUCTIONS) IMPLANT

## 2018-05-08 NOTE — Discharge Instructions (Signed)
PORT-A-CATH: POST OP INSTRUCTIONS  Always review your discharge instruction sheet given to you by the facility where your surgery was performed.   1. A prescription for pain medication may be given to you upon discharge. Take your pain medication as prescribed, if needed. If narcotic pain medicine is not needed, then you make take acetaminophen (Tylenol) or ibuprofen (Advil) as needed.  1,000mg  tylenol given at 10:04AM 2. Take your usually prescribed medications unless otherwise directed. 3. If you need a refill on your pain medication, please contact our office. All narcotic pain medicine now requires a paper prescription.  Phoned in and fax refills are no longer allowed by law.  Prescriptions will not be filled after 5 pm or on weekends.  4. You should follow a light diet for the remainder of the day after your procedure. 5. Most patients will experience some mild swelling and/or bruising in the area of the incision. It may take several days to resolve. 6. It is common to experience some constipation if taking pain medication after surgery. Increasing fluid intake and taking a stool softener (such as Colace) will usually help or prevent this problem from occurring. A mild laxative (Milk of Magnesia or Miralax) should be taken according to package directions if there are no bowel movements after 48 hours.  7. Unless discharge instructions indicate otherwise, you may remove your bandages 48 hours after surgery, and you may shower at that time. You may have steri-strips (small white skin tapes) in place directly over the incision.  These strips should be left on the skin for 7-10 days.  If your surgeon used Dermabond (skin glue) on the incision, you may shower in 24 hours.  The glue will flake off over the next 2-3 weeks.  8. If your port is left accessed at the end of surgery (needle left in port), the dressing cannot get wet and should only by changed by a healthcare professional. When the port is  no longer accessed (when the needle has been removed), follow step 7.   9. ACTIVITIES:  Limit activity involving your arms for the next 72 hours. Do no strenuous exercise or activity for 1 week. You may drive when you are no longer taking prescription pain medication, you can comfortably wear a seatbelt, and you can maneuver your car. 10.You may need to see your doctor in the office for a follow-up appointment.  Please       check with your doctor.  11.When you receive a new Port-a-Cath, you will get a product guide and        ID card.  Please keep them in case you need them.  WHEN TO CALL YOUR DOCTOR [phone]): 1. Fever over 101.0 2. Chills 3. Continued bleeding from incision 4. Increased redness and tenderness at the site 5. Shortness of breath, difficulty breathing   The clinic staff is available to answer your questions during regular business hours. Please dont hesitate to call and ask to speak to one of the nurses or medical assistants for clinical concerns. If you have a medical emergency, go to the nearest emergency room or call 911.  A surgeon from [facility] is always on call at the hospital.     For further information, please visit [email or web address]   Post Anesthesia Home Care Instructions  Activity: Get plenty of rest for the remainder of the day. A responsible individual must stay with you for 24 hours following the procedure.  For the next 24 hours,  DO NOT: -Drive a car -Paediatric nurse -Drink alcoholic beverages -Take any medication unless instructed by your physician -Make any legal decisions or sign important papers.  Meals: Start with liquid foods such as gelatin or soup. Progress to regular foods as tolerated. Avoid greasy, spicy, heavy foods. If nausea and/or vomiting occur, drink only clear liquids until the nausea and/or vomiting subsides. Call your physician if vomiting continues.  Special Instructions/Symptoms: Your  throat may feel dry or sore from the anesthesia or the breathing tube placed in your throat during surgery. If this causes discomfort, gargle with warm salt water. The discomfort should disappear within 24 hours.  If you had a scopolamine patch placed behind your ear for the management of post- operative nausea and/or vomiting:  1. The medication in the patch is effective for 72 hours, after which it should be removed.  Wrap patch in a tissue and discard in the trash. Wash hands thoroughly with soap and water. 2. You may remove the patch earlier than 72 hours if you experience unpleasant side effects which may include dry mouth, dizziness or visual disturbances. 3. Avoid touching the patch. Wash your hands with soap and water after contact with the patch.

## 2018-05-08 NOTE — Transfer of Care (Signed)
Immediate Anesthesia Transfer of Care Note  Patient: Miranda Ayers  Procedure(s) Performed: INSERTION PORT-A-CATH WITH ULTRASOUND (N/A Chest)  Patient Location: PACU  Anesthesia Type:General  Level of Consciousness: awake, alert  and oriented  Airway & Oxygen Therapy: Patient Spontanous Breathing and Patient connected to nasal cannula oxygen  Post-op Assessment: Report given to RN and Post -op Vital signs reviewed and stable  Post vital signs: Reviewed  Last Vitals:  Vitals Value Taken Time  BP 121/75 05/08/2018 11:57 AM  Temp    Pulse 116 05/08/2018 11:58 AM  Resp 14 05/08/2018 11:58 AM  SpO2 100 % 05/08/2018 11:58 AM  Vitals shown include unvalidated device data.  Last Pain:  Vitals:   05/08/18 0949  TempSrc: Oral  PainSc: 2          Complications: No apparent anesthesia complications

## 2018-05-08 NOTE — Interval H&P Note (Signed)
History and Physical Interval Note:  05/08/2018 10:32 AM  Miranda Ayers  has presented today for surgery, with the diagnosis of POOR VENOUS ACCESS  The various methods of treatment have been discussed with the patient and family. After consideration of risks, benefits and other options for treatment, the patient has consented to  Procedure(s): INSERTION PORT-A-CATH WITH ULTRASOUND (N/A) as a surgical intervention .  The patient's history has been reviewed, patient examined, no change in status, stable for surgery.  I have reviewed the patient's chart and labs.  Questions were answered to the patient's satisfaction.     [place]

## 2018-05-08 NOTE — Anesthesia Preprocedure Evaluation (Addendum)
Anesthesia Evaluation  Patient identified by MRN, date of birth, ID band Patient awake    Reviewed: Allergy & Precautions, NPO status , Patient's Chart, lab work & pertinent test results  Airway Mallampati: II  TM Distance: >3 FB Neck ROM: Full    Dental  (+) Dental Advisory Given, Teeth Intact   Pulmonary neg pulmonary ROS,    Pulmonary exam normal breath sounds clear to auscultation       Cardiovascular hypertension, Pt. on medications Normal cardiovascular exam Rhythm:Regular Rate:Normal     Neuro/Psych negative neurological ROS  negative psych ROS   GI/Hepatic negative GI ROS, Neg liver ROS,   Endo/Other  negative endocrine ROS  Renal/GU negative Renal ROS     Musculoskeletal negative musculoskeletal ROS (+)   Abdominal   Peds  Hematology negative hematology ROS (+)   Anesthesia Other Findings   Reproductive/Obstetrics negative OB ROS                            Anesthesia Physical Anesthesia Plan  ASA: II  Anesthesia Plan: General   Post-op Pain Management:    Induction: Intravenous  PONV Risk Score and Plan: 4 or greater and Ondansetron, Dexamethasone, Midazolam and Scopolamine patch - Pre-op  Airway Management Planned: LMA  Additional Equipment:   Intra-op Plan:   Post-operative Plan: Extubation in OR  Informed Consent: I have reviewed the patients History and Physical, chart, labs and discussed the procedure including the risks, benefits and alternatives for the proposed anesthesia with the patient or authorized representative who has indicated his/her understanding and acceptance.   Dental advisory given  Plan Discussed with: CRNA  Anesthesia Plan Comments:         Anesthesia Quick Evaluation

## 2018-05-08 NOTE — Anesthesia Postprocedure Evaluation (Signed)
Anesthesia Post Note  Patient: Administrator, [employer]  Procedure(s) Performed: INSERTION PORT-A-CATH WITH ULTRASOUND (N/A Chest)     Patient location during evaluation: PACU Anesthesia Type: General Level of consciousness: sedated and patient cooperative Pain management: pain level controlled Vital Signs Assessment: post-procedure vital signs reviewed and stable Respiratory status: spontaneous breathing Cardiovascular status: stable Anesthetic complications: no    Last Vitals:  Vitals:   05/08/18 1230 05/08/18 1258  BP: 124/77 129/79  Pulse: 81 78  Resp: 10 16  Temp:  36.5 C  SpO2: 100% 100%    Last Pain:  Vitals:   05/08/18 1258  TempSrc:   PainSc: 0-No pain                 Nolon Nations

## 2018-05-08 NOTE — Anesthesia Procedure Notes (Signed)
Procedure Name: LMA Insertion Date/Time: 05/08/2018 10:56 AM Performed by: Bufford Spikes, CRNA Pre-anesthesia Checklist: Patient identified, Emergency Drugs available, Suction available and Patient being monitored Patient Re-evaluated:Patient Re-evaluated prior to induction Oxygen Delivery Method: Circle system utilized Preoxygenation: Pre-oxygenation with 100% oxygen Induction Type: IV induction Ventilation: Mask ventilation without difficulty LMA: LMA inserted LMA Size: 3.0 Number of attempts: 1 Airway Equipment and Method: Bite block Placement Confirmation: positive ETCO2 Tube secured with: Tape Dental Injury: Teeth and Oropharynx as per pre-operative assessment

## 2018-05-08 NOTE — Op Note (Signed)
reoperative diagnosis: PAC needed  Postoperative diagnosis: Same  Procedure: Portacath Placement with C arm and U/S guidance   Surgeon: Turner Daniels, MD, FACS  Anesthesia: General and 0.25 % marcaine with epinephrine  Clinical History and Indications: The patient is getting ready to begin chemotherapy for her cancer. She  needs a Port-A-Cath for venous access. Risk of bleeding, infection,  Collapse lung,  Death,  DVT,  Organ injury,  Mediastinal injury,  Injury to heart,  Injury to blood vessels,  Nerves,  Migration of catheter,  Embolization of catheter and the need for more surgery.  Description of Procedure: I have seen the patient in the holding area and confirmed the plans for the procedure as noted above. I reviewed the risks and complications again and the patient has no further questions. She wishes to proceed.   The patient was then taken to the operating room. After satisfactory general  anesthesia had been obtained the upper chest and lower neck were prepped and draped as a sterile field. The timeout was done.  The right internal jugular vein  was entered under U/S guidance  and the guidewire threaded into the superior vena cava right atrial area under fluoroscopic guidance. An incision was then made on the anterior chest wall and a subcutaneous pocket fashioned for the port reservoir.  The port tubing was then brought through a subcutaneous tunnel from the port site to the guidewire site.  The port and catheter were attached, locked  and flushed. The catheter was measured and cut to appropriate length.The dilator and peel-away sheath were then advanced over the guidewire while monitoring this with fluoroscopy. The guidewire and dilator were removed and the tubing threaded to approximately 18 cm. The peel-away sheath was then removed. The catheter aspirated and flushed easily. Using fluoroscopy the tip was in the superior vena cava right atrial junction area. It aspirated and  flushed easily. That aspirated and flushed easily.  The reservoir was secured to the fascia with 1 sutures of 2-0 Prolene. A final check with fluoroscopy was done to make sure we had no kinks and good positioning of the tip of the catheter. Everything appeared to be okay. The catheter was aspirated, flushed with dilute heparin and then concentrated aqueous heparin.  The incision was then closed with interrupted 3-0 Vicryl, and 4-0 Monocryl subcuticular with Dermabond on the skin.  There were no operative complications. Estimated blood loss was minimal. All counts were correct. The patient tolerated the procedure well.  Turner Daniels, MD, FACS

## 2018-05-09 ENCOUNTER — Encounter (HOSPITAL_BASED_OUTPATIENT_CLINIC_OR_DEPARTMENT_OTHER): Payer: Self-pay | Admitting: Surgery

## 2018-05-09 ENCOUNTER — Inpatient Hospital Stay: Payer: BLUE CROSS/BLUE SHIELD

## 2018-05-09 ENCOUNTER — Ambulatory Visit (HOSPITAL_COMMUNITY): Payer: BLUE CROSS/BLUE SHIELD

## 2018-05-09 ENCOUNTER — Encounter (HOSPITAL_COMMUNITY): Payer: BLUE CROSS/BLUE SHIELD

## 2018-05-09 ENCOUNTER — Inpatient Hospital Stay (HOSPITAL_BASED_OUTPATIENT_CLINIC_OR_DEPARTMENT_OTHER): Payer: BLUE CROSS/BLUE SHIELD | Admitting: Hematology and Oncology

## 2018-05-09 ENCOUNTER — Telehealth: Payer: Self-pay | Admitting: Hematology and Oncology

## 2018-05-09 DIAGNOSIS — Z170 Estrogen receptor positive status [ER+]: Principal | ICD-10-CM

## 2018-05-09 DIAGNOSIS — C50211 Malignant neoplasm of upper-inner quadrant of right female breast: Secondary | ICD-10-CM

## 2018-05-09 DIAGNOSIS — Z5111 Encounter for antineoplastic chemotherapy: Secondary | ICD-10-CM | POA: Diagnosis not present

## 2018-05-09 DIAGNOSIS — I10 Essential (primary) hypertension: Secondary | ICD-10-CM

## 2018-05-09 LAB — CMP (CANCER CENTER ONLY)
ALK PHOS: 50 U/L (ref 38–126)
ALT: 10 U/L (ref 0–44)
AST: 13 U/L — ABNORMAL LOW (ref 15–41)
Albumin: 4.1 g/dL (ref 3.5–5.0)
Anion gap: 9 (ref 5–15)
BUN: 15 mg/dL (ref 6–20)
CALCIUM: 9.9 mg/dL (ref 8.9–10.3)
CHLORIDE: 105 mmol/L (ref 98–111)
CO2: 24 mmol/L (ref 22–32)
CREATININE: 0.82 mg/dL (ref 0.44–1.00)
Glucose, Bld: 88 mg/dL (ref 70–99)
Potassium: 4 mmol/L (ref 3.5–5.1)
Sodium: 138 mmol/L (ref 135–145)
Total Bilirubin: 0.5 mg/dL (ref 0.3–1.2)
Total Protein: 8 g/dL (ref 6.5–8.1)

## 2018-05-09 LAB — CBC WITH DIFFERENTIAL (CANCER CENTER ONLY)
Abs Immature Granulocytes: 0.04 10*3/uL (ref 0.00–0.07)
BASOS ABS: 0 10*3/uL (ref 0.0–0.1)
BASOS PCT: 0 %
EOS ABS: 0 10*3/uL (ref 0.0–0.5)
EOS PCT: 0 %
HCT: 41.6 % (ref 36.0–46.0)
Hemoglobin: 13.3 g/dL (ref 12.0–15.0)
Immature Granulocytes: 0 %
Lymphocytes Relative: 11 %
Lymphs Abs: 1.3 10*3/uL (ref 0.7–4.0)
MCH: 27.6 pg (ref 26.0–34.0)
MCHC: 32 g/dL (ref 30.0–36.0)
MCV: 86.3 fL (ref 80.0–100.0)
Monocytes Absolute: 1 10*3/uL (ref 0.1–1.0)
Monocytes Relative: 8 %
NRBC: 0 % (ref 0.0–0.2)
Neutro Abs: 10 10*3/uL — ABNORMAL HIGH (ref 1.7–7.7)
Neutrophils Relative %: 81 %
Platelet Count: 259 10*3/uL (ref 150–400)
RBC: 4.82 MIL/uL (ref 3.87–5.11)
RDW: 12.9 % (ref 11.5–15.5)
WBC Count: 12.3 10*3/uL — ABNORMAL HIGH (ref 4.0–10.5)

## 2018-05-09 MED ORDER — ALTEPLASE 2 MG IJ SOLR
2.0000 mg | Freq: Once | INTRAMUSCULAR | Status: DC | PRN
  Filled 2018-05-09: qty 2

## 2018-05-09 MED ORDER — HEPARIN SOD (PORK) LOCK FLUSH 100 UNIT/ML IV SOLN
500.0000 [IU] | Freq: Once | INTRAVENOUS | Status: AC | PRN
  Administered 2018-05-09: 500 [IU]
  Filled 2018-05-09: qty 5

## 2018-05-09 MED ORDER — SODIUM CHLORIDE 0.9% FLUSH
10.0000 mL | INTRAVENOUS | Status: DC | PRN
  Administered 2018-05-09: 10 mL
  Filled 2018-05-09: qty 10

## 2018-05-09 NOTE — Progress Notes (Signed)
Patient Care Team: [name], DO as PCP - General (Family Medicine) [name], MD as Consulting Physician (General Surgery) [name], MD as Consulting Physician (Hematology and Oncology) Kyung Rudd, MD as Consulting Physician (Radiation Oncology)  DIAGNOSIS:  Encounter Diagnosis  Name Primary?  . Malignant neoplasm of upper-inner quadrant of right breast in female, estrogen receptor positive ([place])     SUMMARY OF ONCOLOGIC HISTORY:   Malignant neoplasm of upper-inner quadrant of right breast in female, estrogen receptor positive ([place])   04/17/2018 Initial Diagnosis    Palpable right breast masses UOQ and UIQ with multiple masses 12 to 14 cm, at 1:00 1.6 cm, 1231.8 cm, and o'clock 4.5 cm and at 9:30 position 5 cm: Biopsy revealed IDC with DCIS, grade 3, ER 80%, PR 0%, HER-2 negative, Ki-67 20%; biopsy of the 4.5 cm mass came back as IDC grade 2-3, ER 50%, PR 5%, HER-2 -2+ ratio 1.6, copy #3.2, Ki-67 20%, T2N1 stage IIb    05/09/2018 -  Neo-Adjuvant Chemotherapy    Neoadjuvant chemotherapy with dose dense Adriamycin and Cytoxan x4 followed by Taxol weekly x12     CHIEF COMPLIANT: Patient will start neoadjuvant chemo on 05/09/2018  INTERVAL HISTORY: Miranda Ayers is a 32 year old with above-mentioned history of right breast cancer who is starting neoadjuvant chemotherapy this Friday.  She is here today to discuss the antinausea medications as well as to review the echocardiogram.  She had port placement yesterday and is sore from it but otherwise doing quite well.  REVIEW OF SYSTEMS:   Constitutional: Denies fevers, chills or abnormal weight loss Eyes: Denies blurriness of vision Ears, nose, mouth, throat, and face: Denies mucositis or sore throat Respiratory: Denies cough, dyspnea or wheezes Cardiovascular: Denies palpitation, chest discomfort Gastrointestinal:  Denies nausea, heartburn or change in bowel habits Skin: Denies abnormal skin  rashes Lymphatics: Denies new lymphadenopathy or easy bruising Neurological:Denies numbness, tingling or new weaknesses Behavioral/Psych: Mood is stable, no new changes  Extremities: No lower extremity edema  All other systems were reviewed with the patient and are negative.  I have reviewed the past medical history, past surgical history, social history and family history with the patient and they are unchanged from previous note.  ALLERGIES:  has No Known Allergies.  MEDICATIONS:  Current Outpatient Medications  Medication Sig Dispense Refill  . dexamethasone (DECADRON) 4 MG tablet Take 1 tablet (4 mg total) by mouth daily. Take 1 tablet day after chemo and 1 tablet 2 days after chemo with food 8 tablet 0  . hydrochlorothiazide (HYDRODIURIL) 12.5 MG tablet Take 12.5 mg by mouth daily.    Marland Kitchen ibuprofen (ADVIL,MOTRIN) 800 MG tablet Take 1 tablet (800 mg total) by mouth every 8 (eight) hours as needed. 30 tablet 0  . lidocaine-prilocaine (EMLA) cream Apply to affected area once 30 g 3  . LORazepam (ATIVAN) 0.5 MG tablet Take 1 tablet (0.5 mg total) by mouth at bedtime as needed (Nausea or vomiting). 30 tablet 0  . ondansetron (ZOFRAN) 8 MG tablet Take 1 tablet (8 mg total) by mouth 2 (two) times daily as needed. Start on the third day after chemotherapy. 30 tablet 1  . oxyCODONE (OXY IR/ROXICODONE) 5 MG immediate release tablet Take 1 tablet (5 mg total) by mouth every 6 (six) hours as needed for severe pain. 12 tablet 0  . prochlorperazine (COMPAZINE) 10 MG tablet Take 1 tablet (10 mg total) by mouth every 6 (six) hours as needed (Nausea or vomiting). 30 tablet 1  No current facility-administered medications for this visit.     PHYSICAL EXAMINATION: ECOG PERFORMANCE STATUS: 1 - Symptomatic but completely ambulatory  Vitals:   05/09/18 1007  BP: (!) 144/85  Pulse: 79  Resp: 18  Temp: 98 F (36.7 C)  SpO2: 100%   Filed Weights   05/09/18 1007  Weight: 151 lb 9.6 oz (68.8 kg)     GENERAL:alert, no distress and comfortable SKIN: skin color, texture, turgor are normal, no rashes or significant lesions EYES: normal, Conjunctiva are pink and non-injected, sclera clear OROPHARYNX:no exudate, no erythema and lips, buccal mucosa, and tongue normal  NECK: supple, thyroid normal size, non-tender, without nodularity LYMPH:  no palpable lymphadenopathy in the cervical, axillary or inguinal LUNGS: clear to auscultation and percussion with normal breathing effort HEART: regular rate & rhythm and no murmurs and no lower extremity edema ABDOMEN:abdomen soft, non-tender and normal bowel sounds MUSCULOSKELETAL:no cyanosis of digits and no clubbing  NEURO: alert & oriented x 3 with fluent speech, no focal motor/sensory deficits EXTREMITIES: No lower extremity edema   LABORATORY DATA:  I have reviewed the data as listed CMP Latest Ref Rng & Units 05/09/2018 04/25/2018  Glucose 70 - 99 mg/dL 88 82  BUN 6 - 20 mg/dL 15 9  Creatinine 0.44 - 1.00 mg/dL 0.82 0.79  Sodium 135 - 145 mmol/L 138 140  Potassium 3.5 - 5.1 mmol/L 4.0 4.1  Chloride 98 - 111 mmol/L 105 106  CO2 22 - 32 mmol/L 24 25  Calcium 8.9 - 10.3 mg/dL 9.9 9.5  Total Protein 6.5 - 8.1 g/dL 8.0 8.0  Total Bilirubin 0.3 - 1.2 mg/dL 0.5 0.5  Alkaline Phos 38 - 126 U/L 50 48  AST 15 - 41 U/L 13(L) 16  ALT 0 - 44 U/L 10 12    Lab Results  Component Value Date   WBC 12.3 (H) 05/09/2018   HGB 13.3 05/09/2018   HCT 41.6 05/09/2018   MCV 86.3 05/09/2018   PLT 259 05/09/2018   NEUTROABS 10.0 (H) 05/09/2018    ASSESSMENT & PLAN:  Malignant neoplasm of upper-inner quadrant of right breast in female, estrogen receptor positive ([place]) 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%, PR 0%, HER-2 negative, Ki-67 20%; biopsy of the 4.5 cm mass came back as IDC grade  2-3, ER 50%, PR 5%, HER-2 -2+ ratio 1.6, copy #3.2, Ki-67 20%, T2N1 stage IIb  Recommendation: 1.  Neoadjuvant chemotherapy with dose dense Adriamycin and Cytoxan x4 followed by Taxol weekly x12 started 05/09/2018 2. followed by Bil mastectomy and axillary lymph node dissection 3.  Followed by radiation 4.  Followed by adjuvant antiestrogen therapy ----------------------------------------------------------- Current treatment: Neoadjuvant dose dense Adriamycin and Cytoxan  cycle 1 day 1 will start on 05/11/2018  Echocardiogram 04/29/2018: EF 55 to 60% Labs reviewed Consent obtained Chemo education completed Return to clinic in 1 week after starting chemo for toxicity check   No orders of the defined types were placed in this encounter.  The patient has a good understanding of the overall plan. she agrees with it. she will call with any problems that may develop before the next visit here.   Harriette Ohara, MD 05/09/18

## 2018-05-09 NOTE — Telephone Encounter (Signed)
No 11/20 los.

## 2018-05-09 NOTE — Assessment & Plan Note (Signed)
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%, PR 0%, HER-2 negative, Ki-67 20%; biopsy of the 4.5 cm mass came back as IDC grade 2-3, ER 50%, PR 5%, HER-2 -2+ ratio 1.6, copy #3.2, Ki-67 20%, T2N1 stage IIb  Recommendation: 1.  Neoadjuvant chemotherapy with dose dense Adriamycin and Cytoxan x4 followed by Taxol weekly x12 started 05/09/2018 2. followed by Bil mastectomy and axillary lymph node dissection 3.  Followed by radiation 4.  Followed by adjuvant antiestrogen therapy ----------------------------------------------------------- Current treatment: Neoadjuvant dose dense Adriamycin and Cytoxan today cycle 1 day 1  Echocardiogram 04/29/2018: EF 55 to 60% Labs reviewed Consent obtained Chemo education completed Return to clinic in 1 week for toxicity check

## 2018-05-11 ENCOUNTER — Inpatient Hospital Stay: Payer: BLUE CROSS/BLUE SHIELD

## 2018-05-11 ENCOUNTER — Inpatient Hospital Stay (HOSPITAL_BASED_OUTPATIENT_CLINIC_OR_DEPARTMENT_OTHER): Payer: BLUE CROSS/BLUE SHIELD | Admitting: Medical

## 2018-05-11 ENCOUNTER — Encounter: Payer: Self-pay | Admitting: *Deleted

## 2018-05-11 VITALS — BP 154/60 | HR 65 | Temp 98.4°F | Resp 16

## 2018-05-11 DIAGNOSIS — R0602 Shortness of breath: Secondary | ICD-10-CM | POA: Diagnosis not present

## 2018-05-11 DIAGNOSIS — Z170 Estrogen receptor positive status [ER+]: Secondary | ICD-10-CM | POA: Diagnosis not present

## 2018-05-11 DIAGNOSIS — C50211 Malignant neoplasm of upper-inner quadrant of right female breast: Secondary | ICD-10-CM

## 2018-05-11 DIAGNOSIS — T8090XA Unspecified complication following infusion and therapeutic injection, initial encounter: Secondary | ICD-10-CM

## 2018-05-11 DIAGNOSIS — Z5111 Encounter for antineoplastic chemotherapy: Secondary | ICD-10-CM | POA: Diagnosis not present

## 2018-05-11 DIAGNOSIS — R110 Nausea: Secondary | ICD-10-CM | POA: Diagnosis not present

## 2018-05-11 MED ORDER — DEXAMETHASONE SODIUM PHOSPHATE 10 MG/ML IJ SOLN
INTRAMUSCULAR | Status: AC
  Filled 2018-05-11: qty 1

## 2018-05-11 MED ORDER — DIPHENHYDRAMINE HCL 50 MG/ML IJ SOLN
25.0000 mg | Freq: Once | INTRAMUSCULAR | Status: AC
  Administered 2018-05-11: 25 mg via INTRAVENOUS

## 2018-05-11 MED ORDER — PALONOSETRON HCL INJECTION 0.25 MG/5ML
0.2500 mg | Freq: Once | INTRAVENOUS | Status: AC
  Administered 2018-05-11: 0.25 mg via INTRAVENOUS

## 2018-05-11 MED ORDER — SODIUM CHLORIDE 0.9% FLUSH
10.0000 mL | INTRAVENOUS | Status: DC | PRN
  Administered 2018-05-11: 10 mL
  Filled 2018-05-11: qty 10

## 2018-05-11 MED ORDER — HEPARIN SOD (PORK) LOCK FLUSH 100 UNIT/ML IV SOLN
500.0000 [IU] | Freq: Once | INTRAVENOUS | Status: AC | PRN
  Administered 2018-05-11: 500 [IU]
  Filled 2018-05-11: qty 5

## 2018-05-11 MED ORDER — FAMOTIDINE IN NACL 20-0.9 MG/50ML-% IV SOLN
20.0000 mg | Freq: Once | INTRAVENOUS | Status: AC
  Administered 2018-05-11: 20 mg via INTRAVENOUS

## 2018-05-11 MED ORDER — SODIUM CHLORIDE 0.9 % IV SOLN
Freq: Once | INTRAVENOUS | Status: AC
  Administered 2018-05-11: 09:00:00 via INTRAVENOUS
  Filled 2018-05-11: qty 5

## 2018-05-11 MED ORDER — SODIUM CHLORIDE 0.9 % IV SOLN
600.0000 mg/m2 | Freq: Once | INTRAVENOUS | Status: AC
  Administered 2018-05-11: 1040 mg via INTRAVENOUS
  Filled 2018-05-11: qty 52

## 2018-05-11 MED ORDER — DOXORUBICIN HCL CHEMO IV INJECTION 2 MG/ML
60.0000 mg/m2 | Freq: Once | INTRAVENOUS | Status: AC
  Administered 2018-05-11: 104 mg via INTRAVENOUS
  Filled 2018-05-11: qty 52

## 2018-05-11 MED ORDER — PALONOSETRON HCL INJECTION 0.25 MG/5ML
INTRAVENOUS | Status: AC
  Filled 2018-05-11: qty 5

## 2018-05-11 MED ORDER — SODIUM CHLORIDE 0.9 % IV SOLN
Freq: Once | INTRAVENOUS | Status: AC
  Administered 2018-05-11: 09:00:00 via INTRAVENOUS
  Filled 2018-05-11: qty 250

## 2018-05-11 NOTE — Progress Notes (Signed)
    DATE:  05/11/2018                                           X CHEMO/IMMUNOTHERAPY REACTION          MD: Dr. [name]   AGENT/BLOOD PRODUCT RECEIVING TODAY:              AC   AGENT/BLOOD PRODUCT RECEIVING IMMEDIATELY PRIOR TO REACTION:          Emend/Dex (approx. 50 ml)   VS: BP:     161/99   P:       99       SPO2:       100 % (RA)       BP:     141/92   P:       101     SPO2:       100 % (RA)                REACTION(S):          Shortness of breath and nausea   PREMEDS:      n/a   INTERVENTION: Benadryl 25 mg IV x 1 and Pepcid 20 mg IV x 1   Review of Systems  Review of Systems  Constitutional: Negative for chills, diaphoresis and fever.  HENT: Negative for trouble swallowing and voice change.   Respiratory: Positive for shortness of breath. Negative for cough, chest tightness and wheezing.   Cardiovascular: Negative for chest pain and palpitations.  Gastrointestinal: Positive for nausea. Negative for abdominal pain, constipation, diarrhea and vomiting.  Musculoskeletal: Negative for back pain and myalgias.  Neurological: Negative for dizziness, light-headedness and headaches.     Physical Exam  Physical Exam  Constitutional: No distress.  HENT:  Head: Normocephalic and atraumatic.  Cardiovascular: Normal rate, regular rhythm and normal heart sounds. Exam reveals no gallop and no friction rub.  No murmur heard. Pulmonary/Chest: Effort normal and breath sounds normal. No respiratory distress. She has no wheezes. She has no rales.  Neurological: She is alert.  Skin: Skin is warm and dry. No rash noted. She is not diaphoretic. No erythema.    OUTCOME:                 The patient's symptoms abated.  She was able to proceed with her premeds of Aloxi, Benadryl, dexamethasone, and Emend and then was able to complete cycle 1 of AC today.   Sandi Mealy, MHS, PA-C

## 2018-05-11 NOTE — Patient Instructions (Signed)
[facility] Discharge Instructions for Patients Receiving Chemotherapy  Today you received the following chemotherapy agents:  Adriamycin, Cytoxan  To help prevent nausea and vomiting after your treatment, we encourage you to take your nausea medication as prescribed.   If you develop nausea and vomiting that is not controlled by your nausea medication, call the clinic.   BELOW ARE SYMPTOMS THAT SHOULD BE REPORTED IMMEDIATELY:  *FEVER GREATER THAN 100.5 F  *CHILLS WITH OR WITHOUT FEVER  NAUSEA AND VOMITING THAT IS NOT CONTROLLED WITH YOUR NAUSEA MEDICATION  *UNUSUAL SHORTNESS OF BREATH  *UNUSUAL BRUISING OR BLEEDING  TENDERNESS IN MOUTH AND THROAT WITH OR WITHOUT PRESENCE OF ULCERS  *URINARY PROBLEMS  *BOWEL PROBLEMS  UNUSUAL RASH Items with * indicate a potential emergency and should be followed up as soon as possible.  Feel free to call the clinic should you have any questions or concerns. The clinic phone number is [phone].  Please show the [facility] at check-in to the Emergency Department and triage nurse.    Cyclophosphamide injection What is this medicine? CYCLOPHOSPHAMIDE (sye kloe FOSS fa mide) is a chemotherapy drug. It slows the growth of cancer cells. This medicine is used to treat many types of cancer like lymphoma, myeloma, leukemia, breast cancer, and ovarian cancer, to name a few. This medicine may be used for other purposes; ask your health care provider or pharmacist if you have questions. COMMON BRAND NAME(S): Cytoxan, Neosar What should I tell my health care provider before I take this medicine? They need to know if you have any of these conditions: -blood disorders -history of other chemotherapy -infection -kidney disease -liver disease -recent or ongoing radiation therapy -tumors in the bone marrow -an unusual or allergic reaction to cyclophosphamide, other chemotherapy, other medicines, foods, dyes, or  preservatives -pregnant or trying to get pregnant -breast-feeding How should I use this medicine? This drug is usually given as an injection into a vein or muscle or by infusion into a vein. It is administered in a hospital or clinic by a specially trained health care professional. Talk to your pediatrician regarding the use of this medicine in children. Special care may be needed. Overdosage: If you think you have taken too much of this medicine contact a poison control center or emergency room at once. NOTE: This medicine is only for you. Do not share this medicine with others. What if I miss a dose? It is important not to miss your dose. Call your doctor or health care professional if you are unable to keep an appointment. What may interact with this medicine? This medicine may interact with the following medications: -amiodarone -amphotericin B -azathioprine -certain antiviral medicines for HIV or AIDS such as protease inhibitors (e.g., indinavir, ritonavir) and zidovudine -certain blood pressure medications such as benazepril, captopril, enalapril, fosinopril, lisinopril, moexipril, monopril, perindopril, quinapril, ramipril, trandolapril -certain cancer medications such as anthracyclines (e.g., daunorubicin, doxorubicin), busulfan, cytarabine, paclitaxel, pentostatin, tamoxifen, trastuzumab -certain diuretics such as chlorothiazide, chlorthalidone, hydrochlorothiazide, indapamide, metolazone -certain medicines that treat or prevent blood clots like warfarin -certain muscle relaxants such as succinylcholine -cyclosporine -etanercept -indomethacin -medicines to increase blood counts like filgrastim, pegfilgrastim, sargramostim -medicines used as general anesthesia -metronidazole -natalizumab This list may not describe all possible interactions. Give your health care provider a list of all the medicines, herbs, non-prescription drugs, or dietary supplements you use. Also tell them if  you smoke, drink alcohol, or use illegal drugs. Some items may interact with your medicine. What should  I watch for while using this medicine? Visit your doctor for checks on your progress. This drug may make you feel generally unwell. This is not uncommon, as chemotherapy can affect healthy cells as well as cancer cells. Report any side effects. Continue your course of treatment even though you feel ill unless your doctor tells you to stop. Drink water or other fluids as directed. Urinate often, even at night. In some cases, you may be given additional medicines to help with side effects. Follow all directions for their use. Call your doctor or health care professional for advice if you get a fever, chills or sore throat, or other symptoms of a cold or flu. Do not treat yourself. This drug decreases your body's ability to fight infections. Try to avoid being around people who are sick. This medicine may increase your risk to bruise or bleed. Call your doctor or health care professional if you notice any unusual bleeding. Be careful brushing and flossing your teeth or using a toothpick because you may get an infection or bleed more easily. If you have any dental work done, tell your dentist you are receiving this medicine. You may get drowsy or dizzy. Do not drive, use machinery, or do anything that needs mental alertness until you know how this medicine affects you. Do not become pregnant while taking this medicine or for 1 year after stopping it. Women should inform their doctor if they wish to become pregnant or think they might be pregnant. Men should not father a child while taking this medicine and for 4 months after stopping it. There is a potential for serious side effects to an unborn child. Talk to your health care professional or pharmacist for more information. Do not breast-feed an infant while taking this medicine. This medicine may interfere with the ability to have a child. This medicine  has caused ovarian failure in some women. This medicine has caused reduced sperm counts in some men. You should talk with your doctor or health care professional if you are concerned about your fertility. If you are going to have surgery, tell your doctor or health care professional that you have taken this medicine. What side effects may I notice from receiving this medicine? Side effects that you should report to your doctor or health care professional as soon as possible: -allergic reactions like skin rash, itching or hives, swelling of the face, lips, or tongue -low blood counts - this medicine may decrease the number of white blood cells, red blood cells and platelets. You may be at increased risk for infections and bleeding. -signs of infection - fever or chills, cough, sore throat, pain or difficulty passing urine -signs of decreased platelets or bleeding - bruising, pinpoint red spots on the skin, black, tarry stools, blood in the urine -signs of decreased red blood cells - unusually weak or tired, fainting spells, lightheadedness -breathing problems -dark urine -dizziness -palpitations -swelling of the ankles, feet, hands -trouble passing urine or change in the amount of urine -weight gain -yellowing of the eyes or skin Side effects that usually do not require medical attention (report to your doctor or health care professional if they continue or are bothersome): -changes in nail or skin color -hair loss -missed menstrual periods -mouth sores -nausea, vomiting This list may not describe all possible side effects. Call your doctor for medical advice about side effects. You may report side effects to FDA at 1-800-FDA-1088. Where should I keep my medicine? This drug is given in  a hospital or clinic and will not be stored at home. NOTE: This sheet is a summary. It may not cover all possible information. If you have questions about this medicine, talk to your doctor, pharmacist, or  health care provider.  2018 Elsevier/Gold Standard (2012-04-20 16:22:58)   Doxorubicin injection What is this medicine? DOXORUBICIN (dox oh ROO bi sin) is a chemotherapy drug. It is used to treat many kinds of cancer like leukemia, lymphoma, neuroblastoma, sarcoma, and Wilms' tumor. It is also used to treat bladder cancer, breast cancer, lung cancer, ovarian cancer, stomach cancer, and thyroid cancer. This medicine may be used for other purposes; ask your health care provider or pharmacist if you have questions. COMMON BRAND NAME(S): Adriamycin, Adriamycin PFS, Adriamycin RDF, Rubex What should I tell my health care provider before I take this medicine? They need to know if you have any of these conditions: -heart disease -history of low blood counts caused by a medicine -liver disease -recent or ongoing radiation therapy -an unusual or allergic reaction to doxorubicin, other chemotherapy agents, other medicines, foods, dyes, or preservatives -pregnant or trying to get pregnant -breast-feeding How should I use this medicine? This drug is given as an infusion into a vein. It is administered in a hospital or clinic by a specially trained health care professional. If you have pain, swelling, burning or any unusual feeling around the site of your injection, tell your health care professional right away. Talk to your pediatrician regarding the use of this medicine in children. Special care may be needed. Overdosage: If you think you have taken too much of this medicine contact a poison control center or emergency room at once. NOTE: This medicine is only for you. Do not share this medicine with others. What if I miss a dose? It is important not to miss your dose. Call your doctor or health care professional if you are unable to keep an appointment. What may interact with this medicine? This medicine may interact with the following medications: -6-mercaptopurine -paclitaxel -phenytoin -St.  John's Wort -trastuzumab -verapamil This list may not describe all possible interactions. Give your health care provider a list of all the medicines, herbs, non-prescription drugs, or dietary supplements you use. Also tell them if you smoke, drink alcohol, or use illegal drugs. Some items may interact with your medicine. What should I watch for while using this medicine? This drug may make you feel generally unwell. This is not uncommon, as chemotherapy can affect healthy cells as well as cancer cells. Report any side effects. Continue your course of treatment even though you feel ill unless your doctor tells you to stop. There is a maximum amount of this medicine you should receive throughout your life. The amount depends on the medical condition being treated and your overall health. Your doctor will watch how much of this medicine you receive in your lifetime. Tell your doctor if you have taken this medicine before. You may need blood work done while you are taking this medicine. Your urine may turn red for a few days after your dose. This is not blood. If your urine is dark or brown, call your doctor. In some cases, you may be given additional medicines to help with side effects. Follow all directions for their use. Call your doctor or health care professional for advice if you get a fever, chills or sore throat, or other symptoms of a cold or flu. Do not treat yourself. This drug decreases your body's ability to fight infections.  Try to avoid being around people who are sick. This medicine may increase your risk to bruise or bleed. Call your doctor or health care professional if you notice any unusual bleeding. Talk to your doctor about your risk of cancer. You may be more at risk for certain types of cancers if you take this medicine. Do not become pregnant while taking this medicine or for 6 months after stopping it. Women should inform their doctor if they wish to become pregnant or think they  might be pregnant. Men should not father a child while taking this medicine and for 6 months after stopping it. There is a potential for serious side effects to an unborn child. Talk to your health care professional or pharmacist for more information. Do not breast-feed an infant while taking this medicine. This medicine has caused ovarian failure in some women and reduced sperm counts in some men This medicine may interfere with the ability to have a child. Talk with your doctor or health care professional if you are concerned about your fertility. What side effects may I notice from receiving this medicine? Side effects that you should report to your doctor or health care professional as soon as possible: -allergic reactions like skin rash, itching or hives, swelling of the face, lips, or tongue -breathing problems -chest pain -fast or irregular heartbeat -low blood counts - this medicine may decrease the number of white blood cells, red blood cells and platelets. You may be at increased risk for infections and bleeding. -pain, redness, or irritation at site where injected -signs of infection - fever or chills, cough, sore throat, pain or difficulty passing urine -signs of decreased platelets or bleeding - bruising, pinpoint red spots on the skin, black, tarry stools, blood in the urine -swelling of the ankles, feet, hands -tiredness -weakness Side effects that usually do not require medical attention (report to your doctor or health care professional if they continue or are bothersome): -diarrhea -hair loss -mouth sores -nail discoloration or damage -nausea -red colored urine -vomiting This list may not describe all possible side effects. Call your doctor for medical advice about side effects. You may report side effects to FDA at 1-800-FDA-1088. Where should I keep my medicine? This drug is given in a hospital or clinic and will not be stored at home. NOTE: This sheet is a summary. It  may not cover all possible information. If you have questions about this medicine, talk to your doctor, pharmacist, or health care provider.  2018 Elsevier/Gold Standard (2015-08-03 11:28:51)

## 2018-05-12 ENCOUNTER — Inpatient Hospital Stay: Payer: BLUE CROSS/BLUE SHIELD

## 2018-05-12 VITALS — BP 150/69 | Temp 98.0°F | Resp 16

## 2018-05-12 DIAGNOSIS — Z5111 Encounter for antineoplastic chemotherapy: Secondary | ICD-10-CM | POA: Diagnosis not present

## 2018-05-12 DIAGNOSIS — Z170 Estrogen receptor positive status [ER+]: Principal | ICD-10-CM

## 2018-05-12 DIAGNOSIS — C50211 Malignant neoplasm of upper-inner quadrant of right female breast: Secondary | ICD-10-CM

## 2018-05-12 MED ORDER — PEGFILGRASTIM-CBQV 6 MG/0.6ML ~~LOC~~ SOSY
6.0000 mg | PREFILLED_SYRINGE | Freq: Once | SUBCUTANEOUS | Status: AC
  Administered 2018-05-12: 6 mg via SUBCUTANEOUS

## 2018-05-12 MED ORDER — PEGFILGRASTIM-CBQV 6 MG/0.6ML ~~LOC~~ SOSY
PREFILLED_SYRINGE | SUBCUTANEOUS | Status: AC
  Filled 2018-05-12: qty 0.6

## 2018-05-12 NOTE — Patient Instructions (Signed)
Pegfilgrastim injection What is this medicine? PEGFILGRASTIM (PEG fil gra stim) is a long-acting granulocyte colony-stimulating factor that stimulates the growth of neutrophils, a type of white blood cell important in the body's fight against infection. It is used to reduce the incidence of fever and infection in patients with certain types of cancer who are receiving chemotherapy that affects the bone marrow, and to increase survival after being exposed to high doses of radiation. This medicine may be used for other purposes; ask your health care provider or pharmacist if you have questions. COMMON BRAND NAME(S): Neulasta,Udenyca  What should I tell my health care provider before I take this medicine? They need to know if you have any of these conditions: -kidney disease -latex allergy -ongoing radiation therapy -sickle cell disease -skin reactions to acrylic adhesives (On-Body Injector only) -an unusual or allergic reaction to pegfilgrastim, filgrastim, other medicines, foods, dyes, or preservatives -pregnant or trying to get pregnant -breast-feeding How should I use this medicine? This medicine is for injection under the skin. If you get this medicine at home, you will be taught how to prepare and give the pre-filled syringe or how to use the On-body Injector. Refer to the patient Instructions for Use for detailed instructions. Use exactly as directed. Tell your healthcare provider immediately if you suspect that the On-body Injector may not have performed as intended or if you suspect the use of the On-body Injector resulted in a missed or partial dose. It is important that you put your used needles and syringes in a special sharps container. Do not put them in a trash can. If you do not have a sharps container, call your pharmacist or healthcare provider to get one. Talk to your pediatrician regarding the use of this medicine in children. While this drug may be prescribed for selected  conditions, precautions do apply. Overdosage: If you think you have taken too much of this medicine contact a poison control center or emergency room at once. NOTE: This medicine is only for you. Do not share this medicine with others. What if I miss a dose? It is important not to miss your dose. Call your doctor or health care professional if you miss your dose. If you miss a dose due to an On-body Injector failure or leakage, a new dose should be administered as soon as possible using a single prefilled syringe for manual use. What may interact with this medicine? Interactions have not been studied. Give your health care provider a list of all the medicines, herbs, non-prescription drugs, or dietary supplements you use. Also tell them if you smoke, drink alcohol, or use illegal drugs. Some items may interact with your medicine. This list may not describe all possible interactions. Give your health care provider a list of all the medicines, herbs, non-prescription drugs, or dietary supplements you use. Also tell them if you smoke, drink alcohol, or use illegal drugs. Some items may interact with your medicine. What should I watch for while using this medicine? You may need blood work done while you are taking this medicine. If you are going to need a MRI, CT scan, or other procedure, tell your doctor that you are using this medicine (On-Body Injector only). What side effects may I notice from receiving this medicine? Side effects that you should report to your doctor or health care professional as soon as possible: -allergic reactions like skin rash, itching or hives, swelling of the face, lips, or tongue -dizziness -fever -pain, redness, or irritation at   site where injected -pinpoint red spots on the skin -red or dark-brown urine -shortness of breath or breathing problems -stomach or side pain, or pain at the shoulder -swelling -tiredness -trouble passing urine or change in the amount of  urine Side effects that usually do not require medical attention (report to your doctor or health care professional if they continue or are bothersome): -bone pain -muscle pain This list may not describe all possible side effects. Call your doctor for medical advice about side effects. You may report side effects to FDA at 1-800-FDA-1088. Where should I keep my medicine? Keep out of the reach of children. Store pre-filled syringes in a refrigerator between 2 and 8 degrees C (36 and 46 degrees F). Do not freeze. Keep in carton to protect from light. Throw away this medicine if it is left out of the refrigerator for more than 48 hours. Throw away any unused medicine after the expiration date. NOTE: This sheet is a summary. It may not cover all possible information. If you have questions about this medicine, talk to your doctor, pharmacist, or health care provider.  2018 Elsevier/Gold Standard (2016-06-02 12:58:03)  

## 2018-05-15 ENCOUNTER — Encounter (HOSPITAL_COMMUNITY)
Admission: RE | Admit: 2018-05-15 | Discharge: 2018-05-15 | Disposition: A | Discharge status: Home / Self Care | Payer: BLUE CROSS/BLUE SHIELD | Source: Ambulatory Visit | Attending: Hematology and Oncology | Admitting: Hematology and Oncology

## 2018-05-15 ENCOUNTER — Ambulatory Visit (HOSPITAL_COMMUNITY)
Admission: RE | Admit: 2018-05-15 | Discharge: 2018-05-15 | Disposition: A | Discharge status: Home / Self Care | Payer: BLUE CROSS/BLUE SHIELD | Source: Ambulatory Visit | Attending: Hematology and Oncology | Admitting: Hematology and Oncology

## 2018-05-15 ENCOUNTER — Other Ambulatory Visit: Payer: BLUE CROSS/BLUE SHIELD

## 2018-05-15 ENCOUNTER — Encounter (HOSPITAL_COMMUNITY): Payer: Self-pay

## 2018-05-15 ENCOUNTER — Ambulatory Visit: Payer: BLUE CROSS/BLUE SHIELD | Admitting: Hematology and Oncology

## 2018-05-15 DIAGNOSIS — C50211 Malignant neoplasm of upper-inner quadrant of right female breast: Secondary | ICD-10-CM | POA: Insufficient documentation

## 2018-05-15 DIAGNOSIS — Z170 Estrogen receptor positive status [ER+]: Principal | ICD-10-CM

## 2018-05-15 MED ORDER — SODIUM CHLORIDE (PF) 0.9 % IJ SOLN
INTRAMUSCULAR | Status: AC
  Filled 2018-05-15: qty 50

## 2018-05-15 MED ORDER — IOHEXOL 300 MG/ML  SOLN
100.0000 mL | Freq: Once | INTRAMUSCULAR | Status: AC | PRN
  Administered 2018-05-15: 100 mL via INTRAVENOUS

## 2018-05-15 MED ORDER — TECHNETIUM TC 99M MEDRONATE IV KIT
20.0000 | PACK | Freq: Once | INTRAVENOUS | Status: AC | PRN
  Administered 2018-05-15: 20 via INTRAVENOUS

## 2018-05-18 ENCOUNTER — Ambulatory Visit: Payer: BLUE CROSS/BLUE SHIELD | Admitting: Nurse Practitioner

## 2018-05-18 ENCOUNTER — Other Ambulatory Visit: Payer: BLUE CROSS/BLUE SHIELD

## 2018-05-21 ENCOUNTER — Telehealth: Payer: Self-pay | Admitting: Hematology and Oncology

## 2018-05-21 NOTE — Telephone Encounter (Signed)
I informed the patient the results of CT scans and bone scans The CT scan shows right axillary and subpectoral lymphadenopathy. T4 vertebral body lesion was noted on the CT but the bone scan did not show any evidence of abnormality.  Therefore I do not think it is metastatic disease but will need follow-up. Cysts in the liver also need follow-up. Continue with the current neoadjuvant chemotherapy plan.

## 2018-05-23 ENCOUNTER — Inpatient Hospital Stay: Payer: BLUE CROSS/BLUE SHIELD | Attending: Hematology and Oncology

## 2018-05-23 ENCOUNTER — Encounter: Payer: Self-pay | Admitting: Adult Health

## 2018-05-23 ENCOUNTER — Telehealth: Payer: Self-pay | Admitting: Adult Health

## 2018-05-23 ENCOUNTER — Inpatient Hospital Stay: Payer: BLUE CROSS/BLUE SHIELD

## 2018-05-23 ENCOUNTER — Encounter: Payer: Self-pay | Admitting: Hematology and Oncology

## 2018-05-23 ENCOUNTER — Inpatient Hospital Stay (HOSPITAL_BASED_OUTPATIENT_CLINIC_OR_DEPARTMENT_OTHER): Payer: BLUE CROSS/BLUE SHIELD | Admitting: Adult Health

## 2018-05-23 VITALS — BP 140/79 | HR 110 | Temp 97.4°F | Resp 18 | Ht 61.0 in | Wt 152.0 lb

## 2018-05-23 VITALS — HR 102

## 2018-05-23 DIAGNOSIS — Z170 Estrogen receptor positive status [ER+]: Principal | ICD-10-CM

## 2018-05-23 DIAGNOSIS — C50211 Malignant neoplasm of upper-inner quadrant of right female breast: Secondary | ICD-10-CM | POA: Diagnosis present

## 2018-05-23 DIAGNOSIS — Z5189 Encounter for other specified aftercare: Secondary | ICD-10-CM | POA: Diagnosis not present

## 2018-05-23 DIAGNOSIS — Z5111 Encounter for antineoplastic chemotherapy: Secondary | ICD-10-CM | POA: Insufficient documentation

## 2018-05-23 DIAGNOSIS — Z95828 Presence of other vascular implants and grafts: Secondary | ICD-10-CM

## 2018-05-23 LAB — CMP (CANCER CENTER ONLY)
ALBUMIN: 4 g/dL (ref 3.5–5.0)
ALT: 37 U/L (ref 0–44)
AST: 31 U/L (ref 15–41)
Alkaline Phosphatase: 89 U/L (ref 38–126)
Anion gap: 7 (ref 5–15)
BILIRUBIN TOTAL: 0.2 mg/dL — AB (ref 0.3–1.2)
BUN: 7 mg/dL (ref 6–20)
CHLORIDE: 105 mmol/L (ref 98–111)
CO2: 27 mmol/L (ref 22–32)
CREATININE: 0.74 mg/dL (ref 0.44–1.00)
Calcium: 10 mg/dL (ref 8.9–10.3)
GFR, Est AFR Am: >60 mL/min (ref >60)
GLUCOSE: 92 mg/dL (ref 70–99)
Potassium: 3.7 mmol/L (ref 3.5–5.1)
Sodium: 139 mmol/L (ref 135–145)
Total Protein: 7.8 g/dL (ref 6.5–8.1)

## 2018-05-23 LAB — CBC WITH DIFFERENTIAL (CANCER CENTER ONLY)
Abs Immature Granulocytes: 2.91 10*3/uL — ABNORMAL HIGH (ref 0.00–0.07)
Basophils Absolute: 0.1 10*3/uL (ref 0.0–0.1)
Basophils Relative: 1 %
EOS PCT: 0 %
Eosinophils Absolute: 0 10*3/uL (ref 0.0–0.5)
HEMATOCRIT: 39.1 % (ref 36.0–46.0)
HEMOGLOBIN: 12.5 g/dL (ref 12.0–15.0)
Immature Granulocytes: 17 %
LYMPHS ABS: 1.7 10*3/uL (ref 0.7–4.0)
LYMPHS PCT: 10 %
MCH: 28 pg (ref 26.0–34.0)
MCHC: 32 g/dL (ref 30.0–36.0)
MCV: 87.5 fL (ref 80.0–100.0)
MONO ABS: 1.6 10*3/uL — AB (ref 0.1–1.0)
MONOS PCT: 9 %
Neutro Abs: 10.9 10*3/uL — ABNORMAL HIGH (ref 1.7–7.7)
Neutrophils Relative %: 63 %
Platelet Count: 124 10*3/uL — ABNORMAL LOW (ref 150–400)
RBC: 4.47 MIL/uL (ref 3.87–5.11)
RDW: 12.7 % (ref 11.5–15.5)
WBC Count: 17.2 10*3/uL — ABNORMAL HIGH (ref 4.0–10.5)
nRBC: 0.5 % — ABNORMAL HIGH (ref 0.0–0.2)

## 2018-05-23 MED ORDER — PALONOSETRON HCL INJECTION 0.25 MG/5ML
INTRAVENOUS | Status: AC
  Filled 2018-05-23: qty 5

## 2018-05-23 MED ORDER — SODIUM CHLORIDE 0.9 % IV SOLN
Freq: Once | INTRAVENOUS | Status: AC
  Administered 2018-05-23: 14:00:00 via INTRAVENOUS
  Filled 2018-05-23: qty 250

## 2018-05-23 MED ORDER — SODIUM CHLORIDE 0.9% FLUSH
10.0000 mL | INTRAVENOUS | Status: DC | PRN
  Administered 2018-05-23: 10 mL via INTRAVENOUS
  Filled 2018-05-23: qty 10

## 2018-05-23 MED ORDER — SODIUM CHLORIDE 0.9% FLUSH
10.0000 mL | INTRAVENOUS | Status: DC | PRN
  Administered 2018-05-23: 10 mL
  Filled 2018-05-23: qty 10

## 2018-05-23 MED ORDER — DOXORUBICIN HCL CHEMO IV INJECTION 2 MG/ML
60.0000 mg/m2 | Freq: Once | INTRAVENOUS | Status: AC
  Administered 2018-05-23: 104 mg via INTRAVENOUS
  Filled 2018-05-23: qty 52

## 2018-05-23 MED ORDER — PALONOSETRON HCL INJECTION 0.25 MG/5ML
0.2500 mg | Freq: Once | INTRAVENOUS | Status: AC
  Administered 2018-05-23: 0.25 mg via INTRAVENOUS

## 2018-05-23 MED ORDER — SODIUM CHLORIDE 0.9% FLUSH
10.0000 mL | INTRAVENOUS | Status: DC | PRN
  Filled 2018-05-23: qty 10

## 2018-05-23 MED ORDER — OMEPRAZOLE 40 MG PO CPDR: 40.0000 mg | DELAYED_RELEASE_CAPSULE | Freq: Every day | ORAL | 0 refills | Status: DC

## 2018-05-23 MED ORDER — SODIUM CHLORIDE 0.9 % IV SOLN
Freq: Once | INTRAVENOUS | Status: AC
  Administered 2018-05-23: 13:00:00 via INTRAVENOUS
  Filled 2018-05-23: qty 5

## 2018-05-23 MED ORDER — HEPARIN SOD (PORK) LOCK FLUSH 100 UNIT/ML IV SOLN
500.0000 [IU] | Freq: Once | INTRAVENOUS | Status: AC | PRN
  Administered 2018-05-23: 500 [IU]
  Filled 2018-05-23: qty 5

## 2018-05-23 MED ORDER — SODIUM CHLORIDE 0.9 % IV SOLN
600.0000 mg/m2 | Freq: Once | INTRAVENOUS | Status: AC
  Administered 2018-05-23: 1040 mg via INTRAVENOUS
  Filled 2018-05-23: qty 52

## 2018-05-23 NOTE — Progress Notes (Signed)
Went to infusion to introduce myself to patient as Arboriculturist and to offer available resources.  Asked about insurance ded/OOP and she states she thinks she has met them. Advised her there is a copay assistance program available for Udenyca through [employer] which she can take advantage of once insurance renews if not needed now. She verbalized understanding. Asked if she would like me to enroll her and she states that is fine.  Enrolled in [employer] copay assistance program for Udenyca which will leave her with a $0 copay after her insurance pays. She will receive a copy of the approval letter for her records only. I will provide Lenise a copy for billing/copay purposes once received via fax.  Discussed one-time $1000 Radio broadcast assistant. She verbalized understanding.  She has my card for any additional financial questions or concerns.

## 2018-05-23 NOTE — Telephone Encounter (Signed)
Gave patient avs and calendar.  Scheduled per 12/4 los (from sch msg sent).

## 2018-05-23 NOTE — Progress Notes (Signed)
[facility] Follow up:    Miranda Ayers, [place] [address]   DIAGNOSIS: Cancer Staging Malignant neoplasm of upper-inner quadrant of right breast in female, estrogen receptor positive ([place]) Staging form: Breast, AJCC 8th Edition - Clinical: Stage IIIA (cT2, cN1, cM0, G3, ER+, PR-, HER2-) - Unsigned   SUMMARY OF ONCOLOGIC HISTORY:   Malignant neoplasm of upper-inner quadrant of right breast in female, estrogen receptor positive ([place])   04/17/2018 Initial Diagnosis    Palpable right breast masses UOQ and UIQ with multiple masses 12 to 14 cm, at 1:00 1.6 cm, 1231.8 cm, and o'clock 4.5 cm and at 9:30 position 5 cm: Biopsy revealed IDC with DCIS, grade 3, ER 80%, PR 0%, HER-2 negative, Ki-67 20%; biopsy of the 4.5 cm mass came back as IDC grade 2-3, ER 50%, PR 5%, HER-2 -2+ ratio 1.6, copy #3.2, Ki-67 20%, T2N1 stage IIb    05/09/2018 -  Neo-Adjuvant Chemotherapy    Neoadjuvant chemotherapy with dose dense Adriamycin and Cytoxan x4 followed by Taxol weekly x12     CURRENT THERAPY: Adriamycin/Cytoxan  INTERVAL HISTORY: Miranda Ayers 32 y.o. female returns for evaluation prior to her second cycle of neoadjuvant chemotherapy with Adriamycin/Cytoxan.  She is doing well today.  She says chemo wasn't as bad as she thought it was going to be.  She did vomit x 1.  The times she did experience nausea, she took compazine and it helped.  She is c/o indigestion that has been challenging this week.  She has been slightly more fatigued after work.  She works at [employer] in [place], and plans to work through treatment.  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  She is working 8 hour days 5 days per week.  She had some mild achiness after receiving the Udenyca, but this was relieved by tylenol.     Patient Active Problem List   Diagnosis Date Noted  . Malignant neoplasm of  upper-inner quadrant of right breast in female, estrogen receptor positive ([name]) 04/19/2018    has No Known Allergies.  MEDICAL HISTORY: Past Medical History:  Diagnosis Date  . Cancer [facility])    breast cancer  . Hypertension     SURGICAL HISTORY: Past Surgical History:  Procedure Laterality Date  . OOPHORECTOMY  2013  . PORTACATH PLACEMENT N/A 05/08/2018   Procedure: INSERTION PORT-A-CATH WITH ULTRASOUND;  Surgeon: [name], MD;  Location: [facility];  Service: General;  Laterality: N/A;    SOCIAL HISTORY: Social History   Socioeconomic History  . Marital status: Single    Spouse name: Not on file  . Number of children: Not on file  . Years of education: Not on file  . Highest education level: Not on file  Occupational History  . Not on file  Social Needs  . Financial resource strain: Not on file  . Food insecurity:    Worry: Not on file    Inability: Not on file  . Transportation needs:    Medical: Not on file    Non-medical: Not on file  Tobacco Use  . Smoking status: Never Smoker  . Smokeless tobacco: Never Used  Substance and Sexual Activity  . Alcohol use: Never    Frequency: Never  . Drug use: Never  . Sexual activity: Not on file  Lifestyle  . Physical activity:    Days per week:  Not on file    Minutes per session: Not on file  . Stress: Not on file  Relationships  . Social connections:    Talks on phone: Not on file    Gets together: Not on file    Attends religious service: Not on file    Active member of club or organization: Not on file    Attends meetings of clubs or organizations: Not on file    Relationship status: Not on file  . Intimate partner violence:    Fear of current or ex partner: Not on file    Emotionally abused: Not on file    Physically abused: Not on file    Forced sexual activity: Not on file  Other Topics Concern  . Not on file  Social History Narrative  . Not on file    FAMILY HISTORY: Family  History  Problem Relation Age of Onset  . Lung cancer Maternal Grandmother   . Lung cancer Maternal Grandfather   . Stomach cancer Paternal Grandfather   . Lung cancer Maternal Aunt   . Stomach cancer Maternal Aunt   . Lung cancer Maternal Uncle     Review of Systems  Constitutional: Negative for appetite change, chills, fatigue, fever and unexpected weight change.  HENT:   Negative for hearing loss, lump/mass and sore throat.   Eyes: Negative for eye problems and icterus.  Respiratory: Negative for chest tightness, cough and shortness of breath.   Cardiovascular: Negative for chest pain, leg swelling and palpitations.  Gastrointestinal: Negative for abdominal distention, abdominal pain, constipation, diarrhea, nausea and vomiting.  Endocrine: Negative for hot flashes.  Skin: Negative for itching and rash.  Neurological: Negative for dizziness and headaches.  Psychiatric/Behavioral: Negative for depression. The patient is not nervous/anxious.       PHYSICAL EXAMINATION ECOG PERFORMANCE STATUS: 1 - Symptomatic but completely ambulatory  Vitals:   05/23/18 1009  BP: 140/79  Pulse: (!) 110  Resp: 18  Temp: (!) 97.4 F (36.3 C)  SpO2: 100%   Physical Exam  Constitutional: She is oriented to person, place, and time. She appears well-developed and well-nourished.  HENT:  Head: Normocephalic and atraumatic.  Eyes: Pupils are equal, round, and reactive to light. No scleral icterus.  Neck: Neck supple.  Cardiovascular: Normal rate, regular rhythm and normal heart sounds.  Pulmonary/Chest: Effort normal and breath sounds normal.  Right breast with large mass in upper breast, soft, nipple slightly retracted  Abdominal: Soft. Bowel sounds are normal. She exhibits no distension. There is no tenderness. There is no rebound and no guarding.  Musculoskeletal: She exhibits no edema.  Lymphadenopathy:    She has no cervical adenopathy.  Neurological: She is alert and oriented to  person, place, and time.  Skin: Skin is warm and dry. Capillary refill takes less than 2 seconds.  Psychiatric: She has a normal mood and affect.    LABORATORY DATA:  CBC    Component Value Date/Time   WBC 17.2 (H) 05/23/2018 0951   RBC 4.47 05/23/2018 0951   HGB 12.5 05/23/2018 0951   HCT 39.1 05/23/2018 0951   PLT 124 (L) 05/23/2018 0951   MCV 87.5 05/23/2018 0951   MCH 28.0 05/23/2018 0951   MCHC 32.0 05/23/2018 0951   RDW 12.7 05/23/2018 0951   LYMPHSABS 1.7 05/23/2018 0951   MONOABS 1.6 (H) 05/23/2018 0951   EOSABS 0.0 05/23/2018 0951   BASOSABS 0.1 05/23/2018 0951    CMP     Component Value Date/Time  NA 139 05/23/2018 0951   K 3.7 05/23/2018 0951   CL 105 05/23/2018 0951   CO2 27 05/23/2018 0951   GLUCOSE 92 05/23/2018 0951   BUN 7 05/23/2018 0951   CREATININE 0.74 05/23/2018 0951   CALCIUM 10.0 05/23/2018 0951   PROT 7.8 05/23/2018 0951   ALBUMIN 4.0 05/23/2018 0951   AST 31 05/23/2018 0951   ALT 37 05/23/2018 0951   ALKPHOS 89 05/23/2018 0951   BILITOT 0.2 (L) 05/23/2018 0951   GFRNONAA >60 05/23/2018 0951   GFRAA >60 05/23/2018 0951            ASSESSMENT and THERAPY PLAN:   Malignant neoplasm of upper-inner quadrant of right breast in female, estrogen receptor positive ([place]) 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%, PR 0%, HER-2 negative, Ki-67 20%; biopsy of the 4.5 cm mass came back as IDC grade 2-3, ER 50%, PR 5%, HER-2 -2+ ratio 1.6, copy #3.2, Ki-67 20%, T2N1 stage IIb  CT C/A/P and bone scan per Dr. Lindi Adie phone note impression and plan on 05/21/18: "The CT scan shows right axillary and subpectoral lymphadenopathy.  T4 vertebral body lesion was noted on the CT but the bone scan did not show any evidence of abnormality.  Therefore I do not think it is metastatic disease but will  need follow-up.  Cysts in the liver also need follow-up."  Recommendation: 1.  Neoadjuvant chemotherapy with dose dense Adriamycin and Cytoxan x4 followed by Taxol weekly x12 started 05/09/2018 2. Genetic counseling on 12/23 3. followed by Bil mastectomy and axillary lymph node dissection 4.  Followed by radiation 5.  Followed by adjuvant antiestrogen therapy ----------------------------------------------------------- Current treatment: Neoadjuvant dose dense Adriamycin and Cytoxan today cycle 2 day 1  Echocardiogram 04/29/2018: EF 55 to 60%   Toxicities:  1. Nausea and Vomiting: controlled with anti emetics 2. Indigestion: prescribed Omeprazole and reviewed in detail  Sayre is doing well today.  She tolerated her first cycle of chemotherapy well.  She notes her mass is softer than it was previously.  She will proceed with cycle 2 today.  I reviewed with Dr. Lindi Adie that she did not have a nadir check.  She will forego nadir checks throughout her Adriamycin/Cytoxan, however we are happy to see her at any time if needed.  [place] will return tomorrow for [employer].  She will return in 2 weeks for labs, f/u and cycle 3 of her chemotherapy.         All questions were answered. The patient knows to call the clinic with any problems, questions or concerns. We can certainly see the patient much sooner if necessary.  A total of (30) minutes of face-to-face time was spent with this patient with greater than 50% of that time in counseling and care-coordination.  This note was electronically signed. Scot Dock, NP 05/23/2018

## 2018-05-23 NOTE — Patient Instructions (Signed)
[facility] Discharge Instructions for Patients Receiving Chemotherapy  Today you received the following chemotherapy agents:  Adriamycin, Cytoxan  To help prevent nausea and vomiting after your treatment, we encourage you to take your nausea medication as prescribed.   If you develop nausea and vomiting that is not controlled by your nausea medication, call the clinic.   BELOW ARE SYMPTOMS THAT SHOULD BE REPORTED IMMEDIATELY:  *FEVER GREATER THAN 100.5 F  *CHILLS WITH OR WITHOUT FEVER  NAUSEA AND VOMITING THAT IS NOT CONTROLLED WITH YOUR NAUSEA MEDICATION  *UNUSUAL SHORTNESS OF BREATH  *UNUSUAL BRUISING OR BLEEDING  TENDERNESS IN MOUTH AND THROAT WITH OR WITHOUT PRESENCE OF ULCERS  *URINARY PROBLEMS  *BOWEL PROBLEMS  UNUSUAL RASH Items with * indicate a potential emergency and should be followed up as soon as possible.  Feel free to call the clinic should you have any questions or concerns. The clinic phone number is [phone].  Please show the [place] at check-in to the Emergency Department and triage nurse.    Cyclophosphamide injection What is this medicine? CYCLOPHOSPHAMIDE (sye kloe FOSS fa mide) is a chemotherapy drug. It slows the growth of cancer cells. This medicine is used to treat many types of cancer like lymphoma, myeloma, leukemia, breast cancer, and ovarian cancer, to name a few. This medicine may be used for other purposes; ask your health care provider or pharmacist if you have questions. COMMON BRAND NAME(S): Cytoxan, Neosar What should I tell my health care provider before I take this medicine? They need to know if you have any of these conditions: -blood disorders -history of other chemotherapy -infection -kidney disease -liver disease -recent or ongoing radiation therapy -tumors in the bone marrow -an unusual or allergic reaction to cyclophosphamide, other chemotherapy, other medicines, foods, dyes, or  preservatives -pregnant or trying to get pregnant -breast-feeding How should I use this medicine? This drug is usually given as an injection into a vein or muscle or by infusion into a vein. It is administered in a hospital or clinic by a specially trained health care professional. Talk to your pediatrician regarding the use of this medicine in children. Special care may be needed. Overdosage: If you think you have taken too much of this medicine contact a poison control center or emergency room at once. NOTE: This medicine is only for you. Do not share this medicine with others. What if I miss a dose? It is important not to miss your dose. Call your doctor or health care professional if you are unable to keep an appointment. What may interact with this medicine? This medicine may interact with the following medications: -amiodarone -amphotericin B -azathioprine -certain antiviral medicines for HIV or AIDS such as protease inhibitors (e.g., indinavir, ritonavir) and zidovudine -certain blood pressure medications such as benazepril, captopril, enalapril, fosinopril, lisinopril, moexipril, monopril, perindopril, quinapril, ramipril, trandolapril -certain cancer medications such as anthracyclines (e.g., daunorubicin, doxorubicin), busulfan, cytarabine, paclitaxel, pentostatin, tamoxifen, trastuzumab -certain diuretics such as chlorothiazide, chlorthalidone, hydrochlorothiazide, indapamide, metolazone -certain medicines that treat or prevent blood clots like warfarin -certain muscle relaxants such as succinylcholine -cyclosporine -etanercept -indomethacin -medicines to increase blood counts like filgrastim, pegfilgrastim, sargramostim -medicines used as general anesthesia -metronidazole -natalizumab This list may not describe all possible interactions. Give your health care provider a list of all the medicines, herbs, non-prescription drugs, or dietary supplements you use. Also tell them if  you smoke, drink alcohol, or use illegal drugs. Some items may interact with your medicine. What should  I watch for while using this medicine? Visit your doctor for checks on your progress. This drug may make you feel generally unwell. This is not uncommon, as chemotherapy can affect healthy cells as well as cancer cells. Report any side effects. Continue your course of treatment even though you feel ill unless your doctor tells you to stop. Drink water or other fluids as directed. Urinate often, even at night. In some cases, you may be given additional medicines to help with side effects. Follow all directions for their use. Call your doctor or health care professional for advice if you get a fever, chills or sore throat, or other symptoms of a cold or flu. Do not treat yourself. This drug decreases your body's ability to fight infections. Try to avoid being around people who are sick. This medicine may increase your risk to bruise or bleed. Call your doctor or health care professional if you notice any unusual bleeding. Be careful brushing and flossing your teeth or using a toothpick because you may get an infection or bleed more easily. If you have any dental work done, tell your dentist you are receiving this medicine. You may get drowsy or dizzy. Do not drive, use machinery, or do anything that needs mental alertness until you know how this medicine affects you. Do not become pregnant while taking this medicine or for 1 year after stopping it. Women should inform their doctor if they wish to become pregnant or think they might be pregnant. Men should not father a child while taking this medicine and for 4 months after stopping it. There is a potential for serious side effects to an unborn child. Talk to your health care professional or pharmacist for more information. Do not breast-feed an infant while taking this medicine. This medicine may interfere with the ability to have a child. This medicine  has caused ovarian failure in some women. This medicine has caused reduced sperm counts in some men. You should talk with your doctor or health care professional if you are concerned about your fertility. If you are going to have surgery, tell your doctor or health care professional that you have taken this medicine. What side effects may I notice from receiving this medicine? Side effects that you should report to your doctor or health care professional as soon as possible: -allergic reactions like skin rash, itching or hives, swelling of the face, lips, or tongue -low blood counts - this medicine may decrease the number of white blood cells, red blood cells and platelets. You may be at increased risk for infections and bleeding. -signs of infection - fever or chills, cough, sore throat, pain or difficulty passing urine -signs of decreased platelets or bleeding - bruising, pinpoint red spots on the skin, black, tarry stools, blood in the urine -signs of decreased red blood cells - unusually weak or tired, fainting spells, lightheadedness -breathing problems -dark urine -dizziness -palpitations -swelling of the ankles, feet, hands -trouble passing urine or change in the amount of urine -weight gain -yellowing of the eyes or skin Side effects that usually do not require medical attention (report to your doctor or health care professional if they continue or are bothersome): -changes in nail or skin color -hair loss -missed menstrual periods -mouth sores -nausea, vomiting This list may not describe all possible side effects. Call your doctor for medical advice about side effects. You may report side effects to FDA at 1-800-FDA-1088. Where should I keep my medicine? This drug is given in  a hospital or clinic and will not be stored at home. NOTE: This sheet is a summary. It may not cover all possible information. If you have questions about this medicine, talk to your doctor, pharmacist, or  health care provider.  2018 Elsevier/Gold Standard (2012-04-20 16:22:58)   Doxorubicin injection What is this medicine? DOXORUBICIN (dox oh ROO bi sin) is a chemotherapy drug. It is used to treat many kinds of cancer like leukemia, lymphoma, neuroblastoma, sarcoma, and Wilms' tumor. It is also used to treat bladder cancer, breast cancer, lung cancer, ovarian cancer, stomach cancer, and thyroid cancer. This medicine may be used for other purposes; ask your health care provider or pharmacist if you have questions. COMMON BRAND NAME(S): Adriamycin, Adriamycin PFS, Adriamycin RDF, Rubex What should I tell my health care provider before I take this medicine? They need to know if you have any of these conditions: -heart disease -history of low blood counts caused by a medicine -liver disease -recent or ongoing radiation therapy -an unusual or allergic reaction to doxorubicin, other chemotherapy agents, other medicines, foods, dyes, or preservatives -pregnant or trying to get pregnant -breast-feeding How should I use this medicine? This drug is given as an infusion into a vein. It is administered in a hospital or clinic by a specially trained health care professional. If you have pain, swelling, burning or any unusual feeling around the site of your injection, tell your health care professional right away. Talk to your pediatrician regarding the use of this medicine in children. Special care may be needed. Overdosage: If you think you have taken too much of this medicine contact a poison control center or emergency room at once. NOTE: This medicine is only for you. Do not share this medicine with others. What if I miss a dose? It is important not to miss your dose. Call your doctor or health care professional if you are unable to keep an appointment. What may interact with this medicine? This medicine may interact with the following medications: -6-mercaptopurine -paclitaxel -phenytoin -St.  John's Wort -trastuzumab -verapamil This list may not describe all possible interactions. Give your health care provider a list of all the medicines, herbs, non-prescription drugs, or dietary supplements you use. Also tell them if you smoke, drink alcohol, or use illegal drugs. Some items may interact with your medicine. What should I watch for while using this medicine? This drug may make you feel generally unwell. This is not uncommon, as chemotherapy can affect healthy cells as well as cancer cells. Report any side effects. Continue your course of treatment even though you feel ill unless your doctor tells you to stop. There is a maximum amount of this medicine you should receive throughout your life. The amount depends on the medical condition being treated and your overall health. Your doctor will watch how much of this medicine you receive in your lifetime. Tell your doctor if you have taken this medicine before. You may need blood work done while you are taking this medicine. Your urine may turn red for a few days after your dose. This is not blood. If your urine is dark or brown, call your doctor. In some cases, you may be given additional medicines to help with side effects. Follow all directions for their use. Call your doctor or health care professional for advice if you get a fever, chills or sore throat, or other symptoms of a cold or flu. Do not treat yourself. This drug decreases your body's ability to fight infections.  Try to avoid being around people who are sick. This medicine may increase your risk to bruise or bleed. Call your doctor or health care professional if you notice any unusual bleeding. Talk to your doctor about your risk of cancer. You may be more at risk for certain types of cancers if you take this medicine. Do not become pregnant while taking this medicine or for 6 months after stopping it. Women should inform their doctor if they wish to become pregnant or think they  might be pregnant. Men should not father a child while taking this medicine and for 6 months after stopping it. There is a potential for serious side effects to an unborn child. Talk to your health care professional or pharmacist for more information. Do not breast-feed an infant while taking this medicine. This medicine has caused ovarian failure in some women and reduced sperm counts in some men This medicine may interfere with the ability to have a child. Talk with your doctor or health care professional if you are concerned about your fertility. What side effects may I notice from receiving this medicine? Side effects that you should report to your doctor or health care professional as soon as possible: -allergic reactions like skin rash, itching or hives, swelling of the face, lips, or tongue -breathing problems -chest pain -fast or irregular heartbeat -low blood counts - this medicine may decrease the number of white blood cells, red blood cells and platelets. You may be at increased risk for infections and bleeding. -pain, redness, or irritation at site where injected -signs of infection - fever or chills, cough, sore throat, pain or difficulty passing urine -signs of decreased platelets or bleeding - bruising, pinpoint red spots on the skin, black, tarry stools, blood in the urine -swelling of the ankles, feet, hands -tiredness -weakness Side effects that usually do not require medical attention (report to your doctor or health care professional if they continue or are bothersome): -diarrhea -hair loss -mouth sores -nail discoloration or damage -nausea -red colored urine -vomiting This list may not describe all possible side effects. Call your doctor for medical advice about side effects. You may report side effects to FDA at 1-800-FDA-1088. Where should I keep my medicine? This drug is given in a hospital or clinic and will not be stored at home. NOTE: This sheet is a summary. It  may not cover all possible information. If you have questions about this medicine, talk to your doctor, pharmacist, or health care provider.  2018 Elsevier/Gold Standard (2015-08-03 11:28:51)

## 2018-05-23 NOTE — Assessment & Plan Note (Addendum)
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 Lindi Adie phone note impression and plan on 05/21/18: "The CT scan shows right axillary and subpectoral lymphadenopathy.  T4 vertebral body lesion was noted on the CT but the bone scan did not show any evidence of abnormality.  Therefore I do not think it is metastatic disease but will need follow-up.  Cysts in the liver also need follow-up."  Recommendation: 1.  Neoadjuvant chemotherapy with dose dense Adriamycin and Cytoxan x4 followed by Taxol weekly x12 started 05/09/2018 2. Genetic counseling on 12/23 3. followed by Bil mastectomy and axillary lymph node dissection 4.  Followed by radiation 5.  Followed by adjuvant antiestrogen therapy ----------------------------------------------------------- Current treatment: Neoadjuvant dose dense Adriamycin and Cytoxan today cycle 2 day 1  Echocardiogram 04/29/2018: EF 55 to 60%   Toxicities:  1. Nausea and Vomiting: controlled with anti emetics 2. Indigestion: prescribed Omeprazole and reviewed in detail  Miranda Ayers is doing well today.  She tolerated her first cycle of chemotherapy well.  She notes her mass is softer than it was previously.  She will proceed with cycle 2 today.  I reviewed with Dr. Lindi Adie that she did not have a nadir check.  She will forego nadir checks throughout her Adriamycin/Cytoxan, however we are happy to see her at any time if needed.  Miranda Ayers will return tomorrow for [employer].  She will return in 2 weeks for labs, f/u and cycle 3 of her chemotherapy.

## 2018-05-23 NOTE — Progress Notes (Signed)
Ok to treat with elevated heart rate per Mendel Ryder, NP

## 2018-05-23 NOTE — Addendum Note (Signed)
Addended by: Lenox Ponds E on: 05/23/2018 10:25 AM   Modules accepted: Orders, SmartSet

## 2018-05-24 ENCOUNTER — Inpatient Hospital Stay: Payer: BLUE CROSS/BLUE SHIELD

## 2018-05-24 DIAGNOSIS — C50211 Malignant neoplasm of upper-inner quadrant of right female breast: Secondary | ICD-10-CM

## 2018-05-24 DIAGNOSIS — Z170 Estrogen receptor positive status [ER+]: Principal | ICD-10-CM

## 2018-05-24 DIAGNOSIS — Z5111 Encounter for antineoplastic chemotherapy: Secondary | ICD-10-CM | POA: Diagnosis not present

## 2018-05-24 MED ORDER — PEGFILGRASTIM-CBQV 6 MG/0.6ML ~~LOC~~ SOSY
PREFILLED_SYRINGE | SUBCUTANEOUS | Status: AC
  Filled 2018-05-24: qty 0.6

## 2018-05-24 MED ORDER — PEGFILGRASTIM-CBQV 6 MG/0.6ML ~~LOC~~ SOSY
6.0000 mg | PREFILLED_SYRINGE | Freq: Once | SUBCUTANEOUS | Status: AC
  Administered 2018-05-24: 6 mg via SUBCUTANEOUS

## 2018-05-24 NOTE — Patient Instructions (Signed)
Pegfilgrastim injection What is this medicine? PEGFILGRASTIM (PEG fil gra stim) is a long-acting granulocyte colony-stimulating factor that stimulates the growth of neutrophils, a type of white blood cell important in the body's fight against infection. It is used to reduce the incidence of fever and infection in patients with certain types of cancer who are receiving chemotherapy that affects the bone marrow, and to increase survival after being exposed to high doses of radiation. This medicine may be used for other purposes; ask your health care provider or pharmacist if you have questions. COMMON BRAND NAME(S): Neulasta,Udenyca  What should I tell my health care provider before I take this medicine? They need to know if you have any of these conditions: -kidney disease -latex allergy -ongoing radiation therapy -sickle cell disease -skin reactions to acrylic adhesives (On-Body Injector only) -an unusual or allergic reaction to pegfilgrastim, filgrastim, other medicines, foods, dyes, or preservatives -pregnant or trying to get pregnant -breast-feeding How should I use this medicine? This medicine is for injection under the skin. If you get this medicine at home, you will be taught how to prepare and give the pre-filled syringe or how to use the On-body Injector. Refer to the patient Instructions for Use for detailed instructions. Use exactly as directed. Tell your healthcare provider immediately if you suspect that the On-body Injector may not have performed as intended or if you suspect the use of the On-body Injector resulted in a missed or partial dose. It is important that you put your used needles and syringes in a special sharps container. Do not put them in a trash can. If you do not have a sharps container, call your pharmacist or healthcare provider to get one. Talk to your pediatrician regarding the use of this medicine in children. While this drug may be prescribed for selected  conditions, precautions do apply. Overdosage: If you think you have taken too much of this medicine contact a poison control center or emergency room at once. NOTE: This medicine is only for you. Do not share this medicine with others. What if I miss a dose? It is important not to miss your dose. Call your doctor or health care professional if you miss your dose. If you miss a dose due to an On-body Injector failure or leakage, a new dose should be administered as soon as possible using a single prefilled syringe for manual use. What may interact with this medicine? Interactions have not been studied. Give your health care provider a list of all the medicines, herbs, non-prescription drugs, or dietary supplements you use. Also tell them if you smoke, drink alcohol, or use illegal drugs. Some items may interact with your medicine. This list may not describe all possible interactions. Give your health care provider a list of all the medicines, herbs, non-prescription drugs, or dietary supplements you use. Also tell them if you smoke, drink alcohol, or use illegal drugs. Some items may interact with your medicine. What should I watch for while using this medicine? You may need blood work done while you are taking this medicine. If you are going to need a MRI, CT scan, or other procedure, tell your doctor that you are using this medicine (On-Body Injector only). What side effects may I notice from receiving this medicine? Side effects that you should report to your doctor or health care professional as soon as possible: -allergic reactions like skin rash, itching or hives, swelling of the face, lips, or tongue -dizziness -fever -pain, redness, or irritation at   site where injected -pinpoint red spots on the skin -red or dark-brown urine -shortness of breath or breathing problems -stomach or side pain, or pain at the shoulder -swelling -tiredness -trouble passing urine or change in the amount of  urine Side effects that usually do not require medical attention (report to your doctor or health care professional if they continue or are bothersome): -bone pain -muscle pain This list may not describe all possible side effects. Call your doctor for medical advice about side effects. You may report side effects to FDA at 1-800-FDA-1088. Where should I keep my medicine? Keep out of the reach of children. Store pre-filled syringes in a refrigerator between 2 and 8 degrees C (36 and 46 degrees F). Do not freeze. Keep in carton to protect from light. Throw away this medicine if it is left out of the refrigerator for more than 48 hours. Throw away any unused medicine after the expiration date. NOTE: This sheet is a summary. It may not cover all possible information. If you have questions about this medicine, talk to your doctor, pharmacist, or health care provider.  2018 Elsevier/Gold Standard (2016-06-02 12:58:03)  

## 2018-05-28 ENCOUNTER — Encounter: Payer: BLUE CROSS/BLUE SHIELD | Admitting: Genetic Counselor

## 2018-05-28 ENCOUNTER — Other Ambulatory Visit: Payer: BLUE CROSS/BLUE SHIELD

## 2018-06-06 ENCOUNTER — Telehealth: Payer: Self-pay | Admitting: Adult Health

## 2018-06-06 ENCOUNTER — Inpatient Hospital Stay (HOSPITAL_BASED_OUTPATIENT_CLINIC_OR_DEPARTMENT_OTHER): Payer: BLUE CROSS/BLUE SHIELD | Admitting: Adult Health

## 2018-06-06 ENCOUNTER — Inpatient Hospital Stay: Payer: BLUE CROSS/BLUE SHIELD

## 2018-06-06 ENCOUNTER — Encounter: Payer: Self-pay | Admitting: *Deleted

## 2018-06-06 ENCOUNTER — Encounter: Payer: Self-pay | Admitting: Adult Health

## 2018-06-06 VITALS — BP 133/97 | HR 96 | Temp 98.3°F | Resp 20 | Ht 61.0 in | Wt 151.2 lb

## 2018-06-06 DIAGNOSIS — Z5111 Encounter for antineoplastic chemotherapy: Secondary | ICD-10-CM | POA: Diagnosis not present

## 2018-06-06 DIAGNOSIS — Z170 Estrogen receptor positive status [ER+]: Principal | ICD-10-CM

## 2018-06-06 DIAGNOSIS — C50211 Malignant neoplasm of upper-inner quadrant of right female breast: Secondary | ICD-10-CM

## 2018-06-06 LAB — CMP (CANCER CENTER ONLY)
ALBUMIN: 3.6 g/dL (ref 3.5–5.0)
ALT: 33 U/L (ref 0–44)
AST: 21 U/L (ref 15–41)
Alkaline Phosphatase: 69 U/L (ref 38–126)
Anion gap: 9 (ref 5–15)
BUN: 8 mg/dL (ref 6–20)
CHLORIDE: 105 mmol/L (ref 98–111)
CO2: 26 mmol/L (ref 22–32)
Calcium: 9.3 mg/dL (ref 8.9–10.3)
Creatinine: 0.68 mg/dL (ref 0.44–1.00)
GFR, Est AFR Am: >60 mL/min (ref >60)
GFR, Estimated: >60 mL/min (ref >60)
GLUCOSE: 78 mg/dL (ref 70–99)
POTASSIUM: 3.8 mmol/L (ref 3.5–5.1)
Sodium: 140 mmol/L (ref 135–145)
Total Bilirubin: <0.2 mg/dL — ABNORMAL LOW (ref 0.3–1.2)
Total Protein: 7 g/dL (ref 6.5–8.1)

## 2018-06-06 LAB — CBC WITH DIFFERENTIAL (CANCER CENTER ONLY)
ABS IMMATURE GRANULOCYTES: 2.02 10*3/uL — AB (ref 0.00–0.07)
Basophils Absolute: 0.1 10*3/uL (ref 0.0–0.1)
Basophils Relative: 1 %
Eosinophils Absolute: 0 10*3/uL (ref 0.0–0.5)
Eosinophils Relative: 0 %
HCT: 35.7 % — ABNORMAL LOW (ref 36.0–46.0)
Hemoglobin: 11.6 g/dL — ABNORMAL LOW (ref 12.0–15.0)
Immature Granulocytes: 19 %
LYMPHS PCT: 12 %
Lymphs Abs: 1.3 10*3/uL (ref 0.7–4.0)
MCH: 28.4 pg (ref 26.0–34.0)
MCHC: 32.5 g/dL (ref 30.0–36.0)
MCV: 87.3 fL (ref 80.0–100.0)
MONO ABS: 1.3 10*3/uL — AB (ref 0.1–1.0)
MONOS PCT: 13 %
NEUTROS ABS: 5.9 10*3/uL (ref 1.7–7.7)
Neutrophils Relative %: 55 %
Platelet Count: 209 10*3/uL (ref 150–400)
RBC: 4.09 MIL/uL (ref 3.87–5.11)
RDW: 12.7 % (ref 11.5–15.5)
WBC Count: 10.6 10*3/uL — ABNORMAL HIGH (ref 4.0–10.5)
nRBC: 1.2 % — ABNORMAL HIGH (ref 0.0–0.2)

## 2018-06-06 MED ORDER — PALONOSETRON HCL INJECTION 0.25 MG/5ML
INTRAVENOUS | Status: AC
  Filled 2018-06-06: qty 5

## 2018-06-06 MED ORDER — SODIUM CHLORIDE 0.9 % IV SOLN
Freq: Once | INTRAVENOUS | Status: AC
  Administered 2018-06-06: 12:00:00 via INTRAVENOUS
  Filled 2018-06-06: qty 5

## 2018-06-06 MED ORDER — SODIUM CHLORIDE 0.9% FLUSH
10.0000 mL | INTRAVENOUS | Status: DC | PRN
  Administered 2018-06-06: 10 mL
  Filled 2018-06-06: qty 10

## 2018-06-06 MED ORDER — SODIUM CHLORIDE 0.9 % IV SOLN
Freq: Once | INTRAVENOUS | Status: AC
  Administered 2018-06-06: 12:00:00 via INTRAVENOUS
  Filled 2018-06-06: qty 250

## 2018-06-06 MED ORDER — DOXORUBICIN HCL CHEMO IV INJECTION 2 MG/ML
60.0000 mg/m2 | Freq: Once | INTRAVENOUS | Status: AC
  Administered 2018-06-06: 104 mg via INTRAVENOUS
  Filled 2018-06-06: qty 52

## 2018-06-06 MED ORDER — SODIUM CHLORIDE 0.9 % IV SOLN
600.0000 mg/m2 | Freq: Once | INTRAVENOUS | Status: AC
  Administered 2018-06-06: 1040 mg via INTRAVENOUS
  Filled 2018-06-06: qty 52

## 2018-06-06 MED ORDER — OSELTAMIVIR PHOSPHATE 75 MG PO CAPS: 75.0000 mg | ORAL_CAPSULE | Freq: Every day | ORAL | 0 refills | Status: DC

## 2018-06-06 MED ORDER — PALONOSETRON HCL INJECTION 0.25 MG/5ML
0.2500 mg | Freq: Once | INTRAVENOUS | Status: AC
  Administered 2018-06-06: 0.25 mg via INTRAVENOUS

## 2018-06-06 MED ORDER — HEPARIN SOD (PORK) LOCK FLUSH 100 UNIT/ML IV SOLN
500.0000 [IU] | Freq: Once | INTRAVENOUS | Status: AC | PRN
  Administered 2018-06-06: 500 [IU]
  Filled 2018-06-06: qty 5

## 2018-06-06 NOTE — Assessment & Plan Note (Addendum)
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 Lindi Adie phone note impression and plan on 05/21/18: "The CT scan shows right axillary and subpectoral lymphadenopathy.  T4 vertebral body lesion was noted on the CT but the bone scan did not show any evidence of abnormality.  Therefore I do not think it is metastatic disease but will need follow-up.  Cysts in the liver also need follow-up."  Recommendation: 1.  Neoadjuvant chemotherapy with dose dense Adriamycin and Cytoxan x4 followed by Taxol weekly x12 started 05/09/2018 2. Genetic counseling on 12/23 3. followed by Bil mastectomy and axillary lymph node dissection 4.  Followed by radiation 5.  Followed by adjuvant antiestrogen therapy ----------------------------------------------------------- Current treatment: Neoadjuvant dose dense Adriamycin and Cytoxan today cycle 3 day 1  Echocardiogram 04/29/2018: EF 55 to 60%   Toxicities:  1. Nausea and Vomiting: controlled with anti emetics 2. Indigestion: Omeprazole daily 3. Dysgeusia: recommended she try spicing her foods differently 4. Flu Exposure: Tamiflu 46m daily x 10 days as prophylaxis, since patient's daughter can't stay anywhere else, recommended limited contact, and frequent handwashing, sanitizing.  JDeonwill return tomorrow for [employer]  She will return in 2 weeks for labs, f/u and cycle 4 of her chemotherapy.

## 2018-06-06 NOTE — Progress Notes (Signed)
Miranda Ayers:    Miranda Ayers, [place] [address]   DIAGNOSIS: Cancer Staging Malignant neoplasm of upper-inner quadrant of right breast in female, estrogen receptor positive ([place]) Staging form: Breast, AJCC 8th Edition - Clinical: Stage IIIA (cT2, cN1, cM0, G3, ER+, PR-, HER2-) - Unsigned   SUMMARY OF ONCOLOGIC HISTORY:   Malignant neoplasm of upper-inner quadrant of right breast in female, estrogen receptor positive ([place])   04/17/2018 Initial Diagnosis    Palpable right breast masses UOQ and UIQ with multiple masses 12 to 14 cm, at 1:00 1.6 cm, 1231.8 cm, and o'clock 4.5 cm and at 9:30 position 5 cm: Biopsy revealed IDC with DCIS, grade 3, ER 80%, PR 0%, HER-2 negative, Ki-67 20%; biopsy of the 4.5 cm mass came back as IDC grade 2-3, ER 50%, PR 5%, HER-2 -2+ ratio 1.6, copy #3.2, Ki-67 20%, T2N1 stage IIb    05/09/2018 -  Neo-Adjuvant Chemotherapy    Neoadjuvant chemotherapy with dose dense Adriamycin and Cytoxan x4 followed by Taxol weekly x12     CURRENT THERAPY: Adriamycin and Cytoxan  INTERVAL HISTORY: Miranda Ayers 32 y.o. female returns for evaluation prior to receiving her third cycle of Adriamycin and Cytoxan.  She is tolerating it well.  She previously had some indigestion.  Since then she started Omeprazole and it has improved.  Miranda Ayers is looking forward to Christmas with her 68 year old daughter.  She does note her daughter has been diagnosed with the flu.  She notes her tumor is continuing to soften.  She does note some early taste changes.    Patient Active Problem List   Diagnosis Date Noted  . Malignant neoplasm of upper-inner quadrant of right breast in female, estrogen receptor positive ([place]) 04/19/2018    has No Known Allergies.  MEDICAL HISTORY: Past Medical History:  Diagnosis Date  . Cancer [facility])    breast cancer  . Hypertension     SURGICAL HISTORY: Past Surgical History:   Procedure Laterality Date  . OOPHORECTOMY  2013  . PORTACATH PLACEMENT N/A 05/08/2018   Procedure: INSERTION PORT-A-CATH WITH ULTRASOUND;  Surgeon: [name], MD;  Location: [facility];  Service: General;  Laterality: N/A;    SOCIAL HISTORY: Social History   Socioeconomic History  . Marital status: Single    Spouse name: Not on file  . Number of children: Not on file  . Years of education: Not on file  . Highest education level: Not on file  Occupational History  . Not on file  Social Needs  . Financial resource strain: Not on file  . Food insecurity:    Worry: Not on file    Inability: Not on file  . Transportation needs:    Medical: Not on file    Non-medical: Not on file  Tobacco Use  . Smoking status: Never Smoker  . Smokeless tobacco: Never Used  Substance and Sexual Activity  . Alcohol use: Never    Frequency: Never  . Drug use: Never  . Sexual activity: Not on file  Lifestyle  . Physical activity:    Days per week: Not on file    Minutes per session: Not on file  . Stress: Not on file  Relationships  . Social connections:    Talks on phone: Not on file    Gets together: Not on file    Attends religious service: Not on file    Active member of club  or organization: Not on file    Attends meetings of clubs or organizations: Not on file    Relationship status: Not on file  . Intimate partner violence:    Fear of current or ex partner: Not on file    Emotionally abused: Not on file    Physically abused: Not on file    Forced sexual activity: Not on file  Other Topics Concern  . Not on file  Social History Narrative  . Not on file    FAMILY HISTORY: Family History  Problem Relation Age of Onset  . Lung cancer Maternal Grandmother   . Lung cancer Maternal Grandfather   . Stomach cancer Paternal Grandfather   . Lung cancer Maternal Aunt   . Stomach cancer Maternal Aunt   . Lung cancer Maternal Uncle     Review of Systems   Constitutional: Negative for appetite change, chills, fatigue, fever and unexpected weight change.  HENT:   Negative for hearing loss, lump/mass, sore throat and trouble swallowing.   Eyes: Negative for eye problems and icterus.  Respiratory: Negative for chest tightness, cough and shortness of breath.   Cardiovascular: Negative for chest pain, leg swelling and palpitations.  Gastrointestinal: Negative for abdominal distention, abdominal pain, constipation, diarrhea, nausea and vomiting.  Endocrine: Negative for hot flashes.  Musculoskeletal: Negative for arthralgias.  Skin: Negative for itching and rash.  Neurological: Negative for dizziness, extremity weakness, headaches and numbness.  Hematological: Negative for adenopathy. Does not bruise/bleed easily.  Psychiatric/Behavioral: Negative for depression. The patient is not nervous/anxious.       PHYSICAL EXAMINATION  ECOG PERFORMANCE STATUS: 1 - Symptomatic but completely ambulatory  Vitals:   06/06/18 1050  BP: (!) 133/97  Pulse: 96  Resp: 20  Temp: 98.3 F (36.8 C)  SpO2: 100%    Physical Exam Constitutional:      Appearance: Normal appearance.  HENT:     Head: Normocephalic and atraumatic.     Mouth/Throat:     Pharynx: No oropharyngeal exudate or posterior oropharyngeal erythema.  Eyes:     General: No scleral icterus.    Pupils: Pupils are equal, round, and reactive to light.  Neck:     Musculoskeletal: Neck supple.  Cardiovascular:     Rate and Rhythm: Normal rate and regular rhythm.     Heart sounds: Normal heart sounds.  Pulmonary:     Effort: Pulmonary effort is normal.     Breath sounds: Normal breath sounds.  Abdominal:     General: Abdomen is flat. There is no distension.     Palpations: Abdomen is soft.     Tenderness: There is no abdominal tenderness.  Musculoskeletal:        General: No swelling.  Lymphadenopathy:     Cervical: No cervical adenopathy.  Skin:    General: Skin is warm and dry.      Capillary Refill: Capillary refill takes less than 2 seconds.     Findings: No rash.  Neurological:     General: No focal deficit present.     Mental Status: She is alert and oriented to person, place, and time.  Psychiatric:        Mood and Affect: Mood normal.        Behavior: Behavior normal.     LABORATORY DATA:  CBC    Component Value Date/Time   WBC 10.6 (H) 06/06/2018 1026   RBC 4.09 06/06/2018 1026   HGB 11.6 (L) 06/06/2018 1026   HCT 35.7 (L) 06/06/2018  1026   PLT 209 06/06/2018 1026   MCV 87.3 06/06/2018 1026   MCH 28.4 06/06/2018 1026   MCHC 32.5 06/06/2018 1026   RDW 12.7 06/06/2018 1026   LYMPHSABS 1.3 06/06/2018 1026   MONOABS 1.3 (H) 06/06/2018 1026   EOSABS 0.0 06/06/2018 1026   BASOSABS 0.1 06/06/2018 1026    CMP     Component Value Date/Time   NA 140 06/06/2018 1026   K 3.8 06/06/2018 1026   CL 105 06/06/2018 1026   CO2 26 06/06/2018 1026   GLUCOSE 78 06/06/2018 1026   BUN 8 06/06/2018 1026   CREATININE 0.68 06/06/2018 1026   CALCIUM 9.3 06/06/2018 1026   PROT 7.0 06/06/2018 1026   ALBUMIN 3.6 06/06/2018 1026   AST 21 06/06/2018 1026   ALT 33 06/06/2018 1026   ALKPHOS 69 06/06/2018 1026   BILITOT <0.2 (L) 06/06/2018 1026   GFRNONAA >60 06/06/2018 1026   GFRAA >60 06/06/2018 1026         ASSESSMENT and PLAN:   Malignant neoplasm of upper-inner quadrant of right breast in female, estrogen receptor positive ([place]) 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%, PR 0%, HER-2 negative, Ki-67 20%; biopsy of the 4.5 cm mass came back as IDC grade 2-3, ER 50%, PR 5%, HER-2 -2+ ratio 1.6, copy #3.2, Ki-67 20%, T2N1 stage IIb  CT C/A/P and bone scan per Dr. Lindi Adie phone note impression and plan on 05/21/18: "The CT scan shows right axillary and subpectoral lymphadenopathy.  T4 vertebral  body lesion was noted on the CT but the bone scan did not show any evidence of abnormality.  Therefore I do not think it is metastatic disease but will need follow-Ayers.  Cysts in the liver also need follow-Ayers."  Recommendation: 1.  Neoadjuvant chemotherapy with dose dense Adriamycin and Cytoxan x4 followed by Taxol weekly x12 started 05/09/2018 2. Genetic counseling on 12/23 3. followed by Bil mastectomy and axillary lymph node dissection 4.  Followed by radiation 5.  Followed by adjuvant antiestrogen therapy ----------------------------------------------------------- Current treatment: Neoadjuvant dose dense Adriamycin and Cytoxan today cycle 3 day 1  Echocardiogram 04/29/2018: EF 55 to 60%   Toxicities:  1. Nausea and Vomiting: controlled with anti emetics 2. Indigestion: Omeprazole daily 3. Dysgeusia: recommended she try spicing her foods differently 4. Flu Exposure: Tamiflu 45m daily x 10 days as prophylaxis, since patient's daughter can't stay anywhere else, recommended limited contact, and frequent handwashing, sanitizing.  JTavonwill return tomorrow for [employer]  She will return in 2 weeks for labs, f/u and cycle 4 of her chemotherapy.         All questions were answered. The patient knows to call the clinic with any problems, questions or concerns. We can certainly see the patient much sooner if necessary.  A total of (30) minutes of face-to-face time was spent with this patient with greater than 50% of that time in counseling and care-coordination.  This note was electronically signed. LScot Dock NP 06/06/2018

## 2018-06-06 NOTE — Telephone Encounter (Signed)
Gave patient avs and calendar.   °

## 2018-06-06 NOTE — Patient Instructions (Signed)
[facility] Discharge Instructions for Patients Receiving Chemotherapy  Today you received the following chemotherapy agents Adriamycin, Cytoxan.  To help prevent nausea and vomiting after your treatment, we encourage you to take your nausea medication as prescribed.   If you develop nausea and vomiting that is not controlled by your nausea medication, call the clinic.   BELOW ARE SYMPTOMS THAT SHOULD BE REPORTED IMMEDIATELY:  *FEVER GREATER THAN 100.5 F  *CHILLS WITH OR WITHOUT FEVER  NAUSEA AND VOMITING THAT IS NOT CONTROLLED WITH YOUR NAUSEA MEDICATION  *UNUSUAL SHORTNESS OF BREATH  *UNUSUAL BRUISING OR BLEEDING  TENDERNESS IN MOUTH AND THROAT WITH OR WITHOUT PRESENCE OF ULCERS  *URINARY PROBLEMS  *BOWEL PROBLEMS  UNUSUAL RASH Items with * indicate a potential emergency and should be followed up as soon as possible.  Feel free to call the clinic should you have any questions or concerns. The clinic phone number is [phone].  Please show the CHEMO ALERT CARD at check-in to the Emergency Department and triage nurse.   

## 2018-06-07 ENCOUNTER — Inpatient Hospital Stay: Payer: BLUE CROSS/BLUE SHIELD

## 2018-06-07 DIAGNOSIS — Z5111 Encounter for antineoplastic chemotherapy: Secondary | ICD-10-CM | POA: Diagnosis not present

## 2018-06-07 DIAGNOSIS — C50211 Malignant neoplasm of upper-inner quadrant of right female breast: Secondary | ICD-10-CM

## 2018-06-07 DIAGNOSIS — Z170 Estrogen receptor positive status [ER+]: Principal | ICD-10-CM

## 2018-06-07 MED ORDER — PEGFILGRASTIM-CBQV 6 MG/0.6ML ~~LOC~~ SOSY
6.0000 mg | PREFILLED_SYRINGE | Freq: Once | SUBCUTANEOUS | Status: AC
  Administered 2018-06-07: 6 mg via SUBCUTANEOUS

## 2018-06-07 MED ORDER — PEGFILGRASTIM-CBQV 6 MG/0.6ML ~~LOC~~ SOSY
PREFILLED_SYRINGE | SUBCUTANEOUS | Status: AC
  Filled 2018-06-07: qty 0.6

## 2018-06-11 ENCOUNTER — Inpatient Hospital Stay (HOSPITAL_BASED_OUTPATIENT_CLINIC_OR_DEPARTMENT_OTHER): Payer: BLUE CROSS/BLUE SHIELD | Admitting: Genetic Counselor

## 2018-06-11 ENCOUNTER — Inpatient Hospital Stay: Payer: BLUE CROSS/BLUE SHIELD

## 2018-06-11 ENCOUNTER — Encounter: Payer: Self-pay | Admitting: Genetic Counselor

## 2018-06-11 DIAGNOSIS — Z7183 Encounter for nonprocreative genetic counseling: Secondary | ICD-10-CM

## 2018-06-11 DIAGNOSIS — Z800 Family history of malignant neoplasm of digestive organs: Secondary | ICD-10-CM

## 2018-06-11 DIAGNOSIS — Z801 Family history of malignant neoplasm of trachea, bronchus and lung: Secondary | ICD-10-CM | POA: Diagnosis not present

## 2018-06-11 DIAGNOSIS — C50211 Malignant neoplasm of upper-inner quadrant of right female breast: Secondary | ICD-10-CM | POA: Diagnosis not present

## 2018-06-11 DIAGNOSIS — Z170 Estrogen receptor positive status [ER+]: Principal | ICD-10-CM

## 2018-06-11 NOTE — Progress Notes (Signed)
REFERRING PROVIDER: [name], MD [address] [place], [place] [address]  PRIMARY PROVIDER:  [name], DO  PRIMARY REASON FOR VISIT:  1. Malignant neoplasm of upper-inner quadrant of right breast in female, estrogen receptor positive ([place])   2. Family history of lung cancer   3. Family history of colon cancer   4. Family history of pancreatic cancer      HISTORY OF PRESENT ILLNESS:   Miranda Ayers, a 32 y.o. female, was seen for a [place] cancer genetics consultation at the request of Dr. Lindi Adie due to a personal and family history of cancer.  Miranda Ayers presents to clinic today to discuss the possibility of a hereditary predisposition to cancer, genetic testing, and to further clarify her future cancer risks, as well as potential cancer risks for family members.   In October 2019, at the age of 61, Miranda Ayers was diagnosed with ductal carcinoma of the right breast. This was treated with chemotherapy.  The tumor is ER+. PR-, Her2-.Marland Kitchen    CANCER HISTORY:    Malignant neoplasm of upper-inner quadrant of right breast in female, estrogen receptor positive ([place])   04/17/2018 Initial Diagnosis    Palpable right breast masses UOQ and UIQ with multiple masses 12 to 14 cm, at 1:00 1.6 cm, 1231.8 cm, and o'clock 4.5 cm and at 9:30 position 5 cm: Biopsy revealed IDC with DCIS, grade 3, ER 80%, PR 0%, HER-2 negative, Ki-67 20%; biopsy of the 4.5 cm mass came back as IDC grade 2-3, ER 50%, PR 5%, HER-2 -2+ ratio 1.6, copy #3.2, Ki-67 20%, T2N1 stage IIb    05/09/2018 -  Neo-Adjuvant Chemotherapy    Neoadjuvant chemotherapy with dose dense Adriamycin and Cytoxan x4 followed by Taxol weekly x12      HORMONAL RISK FACTORS:  Menarche was at age 89.  First live birth at age 67.  OCP use for approximately <5 years.  Ovaries intact: yes.  Hysterectomy: no.  Menopausal status: premenopausal.  HRT use: 0 years. Colonoscopy: no; not examined. Mammogram within  the last year: yes. Number of breast biopsies: 1. Up to date with pelvic exams:  yes. Any excessive radiation exposure in the past:  no  Past Medical History:  Diagnosis Date  . Cancer [facility])    breast cancer  . Family history of colon cancer   . Family history of lung cancer   . Family history of pancreatic cancer   . Hypertension     Past Surgical History:  Procedure Laterality Date  . OOPHORECTOMY  2013  . PORTACATH PLACEMENT N/A 05/08/2018   Procedure: INSERTION PORT-A-CATH WITH ULTRASOUND;  Surgeon: [name], MD;  Location: [facility];  Service: General;  Laterality: N/A;    Social History   Socioeconomic History  . Marital status: Single    Spouse name: Not on file  . Number of children: Not on file  . Years of education: Not on file  . Highest education level: Not on file  Occupational History  . Not on file  Social Needs  . Financial resource strain: Not on file  . Food insecurity:    Worry: Not on file    Inability: Not on file  . Transportation needs:    Medical: Not on file    Non-medical: Not on file  Tobacco Use  . Smoking status: Never Smoker  . Smokeless tobacco: Never Used  Substance and Sexual Activity  . Alcohol use: Never    Frequency: Never  .  Drug use: Never  . Sexual activity: Not on file  Lifestyle  . Physical activity:    Days per week: Not on file    Minutes per session: Not on file  . Stress: Not on file  Relationships  . Social connections:    Talks on phone: Not on file    Gets together: Not on file    Attends religious service: Not on file    Active member of club or organization: Not on file    Attends meetings of clubs or organizations: Not on file    Relationship status: Not on file  Other Topics Concern  . Not on file  Social History Narrative  . Not on file     FAMILY HISTORY:  We obtained a detailed, 4-generation family history.  Significant diagnoses are listed below: Family History  Problem  Relation Age of Onset  . Lung cancer Maternal Grandmother   . Lung cancer Maternal Grandfather   . Colon cancer Maternal Grandfather   . Stomach cancer Paternal Grandfather   . Lung cancer Maternal Aunt   . Pancreatic cancer Maternal Aunt   . Lung cancer Maternal Uncle   . Cancer Cousin        childhood cancer in mat first cousin     The patient has one daughter who is cancer free.  She has twin sisters who are cancer free.  Both parents are living.  The patient's father is 6 and cancer free.  He has a brother and sister who do not have cancer.  His father had stomach cancer and his mother died from complications of dementia.  The patient's mother is living at 63.  She was one of 10 children.  Two brothers and two sisters had lung cancer in their 9's-50's.  One sister had pancreatic cancer.  One cousin had a childhood cancer around age 24.  The maternal grandparents are deceased.  The grandmother may have had lung cancer and the grandfather had colon cancer.  Miranda Ayers is unaware of previous family history of genetic testing for hereditary cancer risks. Patient's maternal ancestors are of African American descent, and paternal ancestors are of African American descent. There is no reported Ashkenazi Jewish ancestry. There is no known consanguinity.  GENETIC COUNSELING ASSESSMENT: Miranda Ayers is a 32 y.o. female with a personal and family history of cancer which is somewhat suggestive of a hereditary cancer syndrome and predisposition to cancer. We, therefore, discussed and recommended the following at today's visit.   DISCUSSION: We discussed that about 5-10% of breast cancer is hereditary with most cases due to BRCA mutations.  There are other genes that are associated with hereditary breast cancer syndromes.  About 4-7% of individuals who are diagnosed in their early 30's, and who are negative for BRCA mutations, will have a TP53 mutation.  Other moderate risk genes associated with  young breast cancer include ATM, CHEK2 and PALB2.    We reviewed the characteristics, features and inheritance patterns of hereditary cancer syndromes. We also discussed genetic testing, including the appropriate family members to test, the process of testing, insurance coverage and turn-around-time for results. We discussed the implications of a negative, positive and/or variant of uncertain significant result. We recommended Miranda Ayers pursue genetic testing for the multi cancer gene panel. The Multi-Gene Panel offered by [facility] includes sequencing and/or deletion duplication testing of the following 84 genes: AIP, ALK, APC, ATM, AXIN2,BAP1,  BARD1, BLM, BMPR1A, BRCA1, BRCA2, BRIP1, CASR, CDC73, CDH1, CDK4, CDKN1B, CDKN1C,  CDKN2A (p14ARF), CDKN2A (p16INK4a), CEBPA, CHEK2, CTNNA1, DICER1, DIS3L2, EGFR (c.2369C>T, p.Thr790Met variant only), EPCAM (Deletion/duplication testing only), FH, FLCN, GATA2, GPC3, GREM1 (Promoter region deletion/duplication testing only), HOXB13 (c.251G>A, p.Gly84Glu), HRAS, KIT, MAX, MEN1, MET, MITF (c.952G>A, p.Glu318Lys variant only), MLH1, MSH2, MSH3, MSH6, MUTYH, NBN, NF1, NF2, NTHL1, PALB2, PDGFRA, PHOX2B, PMS2, POLD1, POLE, POT1, PRKAR1A, PTCH1, PTEN, RAD50, RAD51C, RAD51D, RB1, RECQL4, RET, RUNX1, SDHAF2, SDHA (sequence changes only), SDHB, SDHC, SDHD, SMAD4, SMARCA4, SMARCB1, SMARCE1, STK11, SUFU, TERC, TERT, TMEM127, TP53, TSC1, TSC2, VHL, WRN and WT1.    Based on Miranda Ayers personal and family history of cancer, she meets medical criteria for genetic testing. Despite that she meets criteria, she may still have an out of pocket cost. We discussed that if her out of pocket cost for testing is over $100, the laboratory will call and confirm whether she wants to proceed with testing.  If the out of pocket cost of testing is less than $100 she will be billed by the genetic testing laboratory.   PLAN: After considering the risks, benefits, and limitations, Miranda Ayers   provided informed consent to pursue genetic testing and the blood sample was sent to [facility] for analysis of the multi cancer gene panel. Results should be available within approximately 2-3 weeks' time, at which point they will be disclosed by telephone to Miranda Ayers, as will any additional recommendations warranted by these results. Miranda Ayers will receive a summary of her genetic counseling visit and a copy of her results once available. This information will also be available in Epic. We encouraged Miranda Ayers to remain in contact with cancer genetics annually so that we can continuously update the family history and inform her of any changes in cancer genetics and testing that may be of benefit for her family. Miranda Ayers questions were answered to her satisfaction today. Our contact information was provided should additional questions or concerns arise.  Lastly, we encouraged Miranda Ayers to remain in contact with cancer genetics annually so that we can continuously update the family history and inform her of any changes in cancer genetics and testing that may be of benefit for this family.   Ms.  Ayers questions were answered to her satisfaction today. Our contact information was provided should additional questions or concerns arise. Thank you for the referral and allowing [place] to share in the care of your patient.    P. Florene Glen, [place], [facility] Certified Genetic Counselor [email or web address] phone: [phone]  The patient was seen for a total of 35 minutes in face-to-face genetic counseling.  This patient was discussed with Drs. Magrinat, Lindi Adie and/or Burr Medico who agrees with the above.    _______________________________________________________________________ For Office Staff:  Number of people involved in session: 1 Was an Intern/ student involved with case: no

## 2018-06-14 NOTE — Progress Notes (Signed)
FMLA successfully faxed to [facility] [employer] at [phone]. Mailed copy to patient address on file.

## 2018-06-21 NOTE — Progress Notes (Signed)
Patient Care Team: [name], DO as PCP - General (Family Medicine) [name], MD as Consulting Physician (General Surgery) [name], MD as Consulting Physician (Hematology and Oncology) Kyung Rudd, MD as Consulting Physician (Radiation Oncology)  DIAGNOSIS:    ICD-10-CM   1. Malignant neoplasm of upper-inner quadrant of right breast in female, estrogen receptor positive (Level [place]) C50.211    Z17.0     SUMMARY OF ONCOLOGIC HISTORY:   Malignant neoplasm of upper-inner quadrant of right breast in female, estrogen receptor positive ([place])   04/17/2018 Initial Diagnosis    Palpable right breast masses UOQ and UIQ with multiple masses 12 to 14 cm, at 1:00 1.6 cm, 1231.8 cm, and o'clock 4.5 cm and at 9:30 position 5 cm: Biopsy revealed IDC with DCIS, grade 3, ER 80%, PR 0%, HER-2 negative, Ki-67 20%; biopsy of the 4.5 cm mass came back as IDC grade 2-3, ER 50%, PR 5%, HER-2 -2+ ratio 1.6, copy #3.2, Ki-67 20%, T2N1 stage IIb    05/09/2018 -  Neo-Adjuvant Chemotherapy    Neoadjuvant chemotherapy with dose dense Adriamycin and Cytoxan x4 followed by Taxol weekly x12     CHIEF COMPLIANT: Cycle 4 of Adriamycin and Cytoxan  INTERVAL HISTORY: Miranda Ayers is a 33 y.o. with above-mentioned history of right breast cancer who started neoadjuvant chemotherapy on 05/09/18 with 4 cycles of Adriamycin and Cytoxan. She presents to the clinic alone today for cycle 4. She denies nausea, mouth sores, burning in hands and feet, diarrhea, or constipation. She had severe cramps yesterday and reports she has not had a menstrual cycle this month. She has small bumps on her hands and on her lower back that flare up and subside and she has used lotion for it.   REVIEW OF SYSTEMS:   Constitutional: Denies fevers, chills or abnormal weight loss Eyes: Denies blurriness of vision Ears, nose, mouth, throat, and face: Denies mucositis or sore throat Respiratory: Denies cough, dyspnea or  wheezes Cardiovascular: Denies palpitation, chest discomfort Gastrointestinal:  Denies nausea, heartburn or change in bowel habits (+) abd cramps Skin: (+) skin rash on hands and lower back Lymphatics: Denies new lymphadenopathy or easy bruising Neurological:Denies numbness, tingling or new weaknesses Behavioral/Psych: Mood is stable, no new changes  Extremities: No lower extremity edema Breast: denies any pain or lumps or nodules in either breasts All other systems were reviewed with the patient and are negative.  I have reviewed the past medical history, past surgical history, social history and family history with the patient and they are unchanged from previous note.  ALLERGIES:  has No Known Allergies.  MEDICATIONS:  Current Outpatient Medications  Medication Sig Dispense Refill  . dexamethasone (DECADRON) 4 MG tablet Take 1 tablet (4 mg total) by mouth daily. Take 1 tablet day after chemo and 1 tablet 2 days after chemo with food 8 tablet 0  . hydrochlorothiazide (HYDRODIURIL) 12.5 MG tablet Take 12.5 mg by mouth daily.    Marland Kitchen ibuprofen (ADVIL,MOTRIN) 800 MG tablet Take 1 tablet (800 mg total) by mouth every 8 (eight) hours as needed. 30 tablet 0  . lidocaine-prilocaine (EMLA) cream Apply to affected area once 30 g 3  . LORazepam (ATIVAN) 0.5 MG tablet Take 1 tablet (0.5 mg total) by mouth at bedtime as needed (Nausea or vomiting). 30 tablet 0  . omeprazole (PRILOSEC) 40 MG capsule Take 1 capsule (40 mg total) by mouth daily. 30 capsule 0  . ondansetron (ZOFRAN) 8 MG tablet Take 1 tablet (8 mg total)  by mouth 2 (two) times daily as needed. Start on the third day after chemotherapy. 30 tablet 1  . oseltamivir (TAMIFLU) 75 MG capsule Take 1 capsule (75 mg total) by mouth daily. 10 capsule 0  . oxyCODONE (OXY IR/ROXICODONE) 5 MG immediate release tablet Take 1 tablet (5 mg total) by mouth every 6 (six) hours as needed for severe pain. 12 tablet 0  . prochlorperazine (COMPAZINE) 10 MG  tablet Take 1 tablet (10 mg total) by mouth every 6 (six) hours as needed (Nausea or vomiting). 30 tablet 1   No current facility-administered medications for this visit.    Facility-Administered Medications Ordered in Other Visits  Medication Dose Route Frequency Provider Last Rate Last Dose  . cyclophosphamide (CYTOXAN) 1,040 mg in sodium chloride 0.9 % 250 mL chemo infusion  600 mg/m2 (Treatment Plan Recorded) Intravenous Once [name], MD      . DOXOrubicin (ADRIAMYCIN) chemo injection 104 mg  60 mg/m2 (Treatment Plan Recorded) Intravenous Once [name], MD      . fosaprepitant (EMEND) 150 mg, dexamethasone (DECADRON) 12 mg in sodium chloride 0.9 % 145 mL IVPB   Intravenous Once [name], MD 454 mL/hr at 06/22/18 1109    . heparin lock flush 100 unit/mL  500 Units Intracatheter Once PRN [name], MD      . sodium chloride flush (NS) 0.9 % injection 10 mL  10 mL Intracatheter PRN [name], MD        PHYSICAL EXAMINATION: ECOG PERFORMANCE STATUS: 1 - Symptomatic but completely ambulatory  Vitals:   06/22/18 0951  BP: (!) 144/87  Pulse: 89  Resp: 17  Temp: 98.4 F (36.9 C)  SpO2: 100%   Filed Weights   06/22/18 0951  Weight: 150 lb 4.8 oz (68.2 kg)    GENERAL:alert, no distress and comfortable SKIN: skin color, texture, turgor are normal, no rashes or significant lesions EYES: normal, Conjunctiva are pink and non-injected, sclera clear OROPHARYNX:no exudate, no erythema and lips, buccal mucosa, and tongue normal  NECK: supple, thyroid normal size, non-tender, without nodularity LYMPH:  no palpable lymphadenopathy in the cervical, axillary or inguinal LUNGS: clear to auscultation and percussion with normal breathing effort HEART: regular rate & rhythm and no murmurs and no lower extremity edema ABDOMEN:abdomen soft, non-tender and normal bowel sounds MUSCULOSKELETAL:no cyanosis of digits and no clubbing  NEURO: alert & oriented x 3 with fluent speech,  no focal motor/sensory deficits EXTREMITIES: No lower extremity edema  LABORATORY DATA:  I have reviewed the data as listed CMP Latest Ref Rng & Units 06/22/2018 06/06/2018 05/23/2018  Glucose 70 - 99 mg/dL 89 78 92  BUN 6 - 20 mg/dL 7 8 7   Creatinine 0.44 - 1.00 mg/dL 0.68 0.68 0.74  Sodium 135 - 145 mmol/L 140 140 139  Potassium 3.5 - 5.1 mmol/L 4.0 3.8 3.7  Chloride 98 - 111 mmol/L 107 105 105  CO2 22 - 32 mmol/L 23 26 27   Calcium 8.9 - 10.3 mg/dL 9.6 9.3 10.0  Total Protein 6.5 - 8.1 g/dL 7.2 7.0 7.8  Total Bilirubin 0.3 - 1.2 mg/dL 0.3 <0.2(L) 0.2(L)  Alkaline Phos 38 - 126 U/L 60 69 89  AST 15 - 41 U/L 25 21 31   ALT 0 - 44 U/L 30 33 37    Lab Results  Component Value Date   WBC 6.8 06/22/2018   HGB 11.1 (L) 06/22/2018   HCT 35.3 (L) 06/22/2018   MCV 89.1 06/22/2018   PLT 177 06/22/2018  NEUTROABS 4.0 06/22/2018    ASSESSMENT & PLAN:  Malignant neoplasm of upper-inner quadrant of right breast in female, estrogen receptor positive ([place]) 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%, PR 0%, HER-2 negative, Ki-67 20%; biopsy of the 4.5 cm mass came back as IDC grade 2-3, ER 50%, PR 5%, HER-2 -2+ ratio 1.6, copy #3.2, Ki-67 20%, T2N1 stage IIb  Recommendation: 1.Neoadjuvant chemotherapy with dose dense Adriamycinand Cytoxan x4 followed by Taxol weekly x12 started 05/09/2018 2.followed byBilmastectomy and axillary lymph node dissection 3.Followed by radiation 4.Followed by adjuvant antiestrogen therapy ----------------------------------------------------------- Current treatment: Neoadjuvant dose dense Adriamycin and Cytoxan  cycle  4 day 1 started 05/11/2018 Chemo toxicities: 1. Nausea and Vomiting: controlled with anti emetics 2. Indigestion: Omeprazole daily 3. Dysgeusia: recommended she try spicing her  foods differently 4. Flu Exposure: Tamiflu 45m daily x 10 days as prophylaxis  Return to clinic in 2 weeks to start weekly Taxol   No orders of the defined types were placed in this encounter.  The patient has a good understanding of the overall plan. she agrees with it. she will call with any problems that may develop before the next visit here.  GNicholas Lose MD 06/22/2018   I, Molly Dorshimer, am acting as scribe for VNicholas Lose MD.  I have reviewed the above documentation for accuracy and completeness, and I agree with the above.

## 2018-06-22 ENCOUNTER — Inpatient Hospital Stay: Payer: BLUE CROSS/BLUE SHIELD

## 2018-06-22 ENCOUNTER — Telehealth: Payer: Self-pay | Admitting: Genetic Counselor

## 2018-06-22 ENCOUNTER — Inpatient Hospital Stay (HOSPITAL_BASED_OUTPATIENT_CLINIC_OR_DEPARTMENT_OTHER): Payer: BLUE CROSS/BLUE SHIELD | Admitting: Hematology and Oncology

## 2018-06-22 ENCOUNTER — Inpatient Hospital Stay: Payer: BLUE CROSS/BLUE SHIELD | Attending: Genetic Counselor

## 2018-06-22 ENCOUNTER — Encounter: Payer: Self-pay | Admitting: Genetic Counselor

## 2018-06-22 DIAGNOSIS — Z5189 Encounter for other specified aftercare: Secondary | ICD-10-CM | POA: Diagnosis not present

## 2018-06-22 DIAGNOSIS — Z170 Estrogen receptor positive status [ER+]: Secondary | ICD-10-CM

## 2018-06-22 DIAGNOSIS — Z5111 Encounter for antineoplastic chemotherapy: Secondary | ICD-10-CM | POA: Diagnosis present

## 2018-06-22 DIAGNOSIS — C50211 Malignant neoplasm of upper-inner quadrant of right female breast: Secondary | ICD-10-CM | POA: Insufficient documentation

## 2018-06-22 DIAGNOSIS — Z1379 Encounter for other screening for genetic and chromosomal anomalies: Secondary | ICD-10-CM | POA: Insufficient documentation

## 2018-06-22 LAB — CBC WITH DIFFERENTIAL (CANCER CENTER ONLY)
Abs Immature Granulocytes: 0.62 10*3/uL — ABNORMAL HIGH (ref 0.00–0.07)
BASOS ABS: 0.1 10*3/uL (ref 0.0–0.1)
Basophils Relative: 1 %
EOS PCT: 0 %
Eosinophils Absolute: 0 10*3/uL (ref 0.0–0.5)
HEMATOCRIT: 35.3 % — AB (ref 36.0–46.0)
HEMOGLOBIN: 11.1 g/dL — AB (ref 12.0–15.0)
Immature Granulocytes: 9 %
LYMPHS ABS: 1 10*3/uL (ref 0.7–4.0)
LYMPHS PCT: 15 %
MCH: 28 pg (ref 26.0–34.0)
MCHC: 31.4 g/dL (ref 30.0–36.0)
MCV: 89.1 fL (ref 80.0–100.0)
Monocytes Absolute: 1.1 10*3/uL — ABNORMAL HIGH (ref 0.1–1.0)
Monocytes Relative: 16 %
Neutro Abs: 4 10*3/uL (ref 1.7–7.7)
Neutrophils Relative %: 59 %
Platelet Count: 177 10*3/uL (ref 150–400)
RBC: 3.96 MIL/uL (ref 3.87–5.11)
RDW: 14.6 % (ref 11.5–15.5)
WBC Count: 6.8 10*3/uL (ref 4.0–10.5)
nRBC: 1.8 % — ABNORMAL HIGH (ref 0.0–0.2)

## 2018-06-22 LAB — CMP (CANCER CENTER ONLY)
ALK PHOS: 60 U/L (ref 38–126)
ALT: 30 U/L (ref 0–44)
AST: 25 U/L (ref 15–41)
Albumin: 3.8 g/dL (ref 3.5–5.0)
Anion gap: 10 (ref 5–15)
BILIRUBIN TOTAL: 0.3 mg/dL (ref 0.3–1.2)
BUN: 7 mg/dL (ref 6–20)
CALCIUM: 9.6 mg/dL (ref 8.9–10.3)
CO2: 23 mmol/L (ref 22–32)
Chloride: 107 mmol/L (ref 98–111)
Creatinine: 0.68 mg/dL (ref 0.44–1.00)
GFR, Estimated: >60 mL/min (ref >60)
GLUCOSE: 89 mg/dL (ref 70–99)
Potassium: 4 mmol/L (ref 3.5–5.1)
SODIUM: 140 mmol/L (ref 135–145)
Total Protein: 7.2 g/dL (ref 6.5–8.1)

## 2018-06-22 MED ORDER — DOXORUBICIN HCL CHEMO IV INJECTION 2 MG/ML
60.0000 mg/m2 | Freq: Once | INTRAVENOUS | Status: AC
  Administered 2018-06-22: 104 mg via INTRAVENOUS
  Filled 2018-06-22: qty 52

## 2018-06-22 MED ORDER — SODIUM CHLORIDE 0.9 % IV SOLN
Freq: Once | INTRAVENOUS | Status: AC
  Administered 2018-06-22: 10:00:00 via INTRAVENOUS
  Filled 2018-06-22: qty 250

## 2018-06-22 MED ORDER — SODIUM CHLORIDE 0.9% FLUSH
10.0000 mL | INTRAVENOUS | Status: DC | PRN
  Administered 2018-06-22: 10 mL
  Filled 2018-06-22: qty 10

## 2018-06-22 MED ORDER — SODIUM CHLORIDE 0.9 % IV SOLN
600.0000 mg/m2 | Freq: Once | INTRAVENOUS | Status: AC
  Administered 2018-06-22: 1040 mg via INTRAVENOUS
  Filled 2018-06-22: qty 52

## 2018-06-22 MED ORDER — HEPARIN SOD (PORK) LOCK FLUSH 100 UNIT/ML IV SOLN
500.0000 [IU] | Freq: Once | INTRAVENOUS | Status: AC | PRN
  Administered 2018-06-22: 500 [IU]
  Filled 2018-06-22: qty 5

## 2018-06-22 MED ORDER — PALONOSETRON HCL INJECTION 0.25 MG/5ML
INTRAVENOUS | Status: AC
  Filled 2018-06-22: qty 5

## 2018-06-22 MED ORDER — PALONOSETRON HCL INJECTION 0.25 MG/5ML
0.2500 mg | Freq: Once | INTRAVENOUS | Status: AC
  Administered 2018-06-22: 0.25 mg via INTRAVENOUS

## 2018-06-22 MED ORDER — SODIUM CHLORIDE 0.9 % IV SOLN
Freq: Once | INTRAVENOUS | Status: AC
  Administered 2018-06-22: 11:00:00 via INTRAVENOUS
  Filled 2018-06-22: qty 5

## 2018-06-22 NOTE — Patient Instructions (Signed)
[facility] Discharge Instructions for Patients Receiving Chemotherapy  Today you received the following chemotherapy agents Doxorubicin (ADRIAMYCIN) & Cyclophosphamide (CYTOXAN).  To help prevent nausea and vomiting after your treatment, we encourage you to take your nausea medication as prescribed.   If you develop nausea and vomiting that is not controlled by your nausea medication, call the clinic.   BELOW ARE SYMPTOMS THAT SHOULD BE REPORTED IMMEDIATELY:  *FEVER GREATER THAN 100.5 F  *CHILLS WITH OR WITHOUT FEVER  NAUSEA AND VOMITING THAT IS NOT CONTROLLED WITH YOUR NAUSEA MEDICATION  *UNUSUAL SHORTNESS OF BREATH  *UNUSUAL BRUISING OR BLEEDING  TENDERNESS IN MOUTH AND THROAT WITH OR WITHOUT PRESENCE OF ULCERS  *URINARY PROBLEMS  *BOWEL PROBLEMS  UNUSUAL RASH Items with * indicate a potential emergency and should be followed up as soon as possible.  Feel free to call the clinic should you have any questions or concerns. The clinic phone number is [phone].  Please show the CHEMO ALERT CARD at check-in to the Emergency Department and triage nurse.   

## 2018-06-22 NOTE — Assessment & Plan Note (Signed)
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%, PR 0%, HER-2 negative, Ki-67 20%; biopsy of the 4.5 cm mass came back as IDC grade 2-3, ER 50%, PR 5%, HER-2 -2+ ratio 1.6, copy #3.2, Ki-67 20%, T2N1 stage IIb  Recommendation: 1.Neoadjuvant chemotherapy with dose dense Adriamycinand Cytoxan x4 followed by Taxol weekly x12 started 05/09/2018 2.followed byBilmastectomy and axillary lymph node dissection 3.Followed by radiation 4.Followed by adjuvant antiestrogen therapy ----------------------------------------------------------- Current treatment: Neoadjuvant dose dense Adriamycin and Cytoxan  cycle 4 day 1 started 05/11/2018 Chemo toxicities: 1. Nausea and Vomiting: controlled with anti emetics 2. Indigestion: Omeprazole daily 3. Dysgeusia: recommended she try spicing her foods differently 4. Flu Exposure: Tamiflu 55m daily x 10 days as prophylaxis  Return to clinic in 2 weeks to start weekly Taxol

## 2018-06-22 NOTE — Telephone Encounter (Signed)
LM on VM that results were back and to please call. ?

## 2018-06-23 ENCOUNTER — Inpatient Hospital Stay: Payer: BLUE CROSS/BLUE SHIELD

## 2018-06-23 VITALS — BP 147/97 | HR 96 | Temp 98.4°F | Resp 18

## 2018-06-23 DIAGNOSIS — Z5111 Encounter for antineoplastic chemotherapy: Secondary | ICD-10-CM | POA: Diagnosis not present

## 2018-06-23 DIAGNOSIS — C50211 Malignant neoplasm of upper-inner quadrant of right female breast: Secondary | ICD-10-CM

## 2018-06-23 DIAGNOSIS — Z170 Estrogen receptor positive status [ER+]: Principal | ICD-10-CM

## 2018-06-23 MED ORDER — PEGFILGRASTIM-CBQV 6 MG/0.6ML ~~LOC~~ SOSY
PREFILLED_SYRINGE | SUBCUTANEOUS | Status: AC
  Filled 2018-06-23: qty 0.6

## 2018-06-23 MED ORDER — PEGFILGRASTIM-CBQV 6 MG/0.6ML ~~LOC~~ SOSY
6.0000 mg | PREFILLED_SYRINGE | Freq: Once | SUBCUTANEOUS | Status: AC
  Administered 2018-06-23: 6 mg via SUBCUTANEOUS

## 2018-06-23 NOTE — Patient Instructions (Signed)
Pegfilgrastim injection  What is this medicine?  PEGFILGRASTIM (PEG fil gra stim) is a long-acting granulocyte colony-stimulating factor that stimulates the growth of neutrophils, a type of white blood cell important in the body's fight against infection. It is used to reduce the incidence of fever and infection in patients with certain types of cancer who are receiving chemotherapy that affects the bone marrow, and to increase survival after being exposed to high doses of radiation.  This medicine may be used for other purposes; ask your health care provider or pharmacist if you have questions.  COMMON BRAND NAME(S): Fulphila, Neulasta, UDENYCA  What should I tell my health care provider before I take this medicine?  They need to know if you have any of these conditions:  -kidney disease  -latex allergy  -ongoing radiation therapy  -sickle cell disease  -skin reactions to acrylic adhesives (On-Body Injector only)  -an unusual or allergic reaction to pegfilgrastim, filgrastim, other medicines, foods, dyes, or preservatives  -pregnant or trying to get pregnant  -breast-feeding  How should I use this medicine?  This medicine is for injection under the skin. If you get this medicine at home, you will be taught how to prepare and give the pre-filled syringe or how to use the On-body Injector. Refer to the patient Instructions for Use for detailed instructions. Use exactly as directed. Tell your healthcare provider immediately if you suspect that the On-body Injector may not have performed as intended or if you suspect the use of the On-body Injector resulted in a missed or partial dose.  It is important that you put your used needles and syringes in a special sharps container. Do not put them in a trash can. If you do not have a sharps container, call your pharmacist or healthcare provider to get one.  Talk to your pediatrician regarding the use of this medicine in children. While this drug may be prescribed for  selected conditions, precautions do apply.  Overdosage: If you think you have taken too much of this medicine contact a poison control center or emergency room at once.  NOTE: This medicine is only for you. Do not share this medicine with others.  What if I miss a dose?  It is important not to miss your dose. Call your doctor or health care professional if you miss your dose. If you miss a dose due to an On-body Injector failure or leakage, a new dose should be administered as soon as possible using a single prefilled syringe for manual use.  What may interact with this medicine?  Interactions have not been studied.  Give your health care provider a list of all the medicines, herbs, non-prescription drugs, or dietary supplements you use. Also tell them if you smoke, drink alcohol, or use illegal drugs. Some items may interact with your medicine.  This list may not describe all possible interactions. Give your health care provider a list of all the medicines, herbs, non-prescription drugs, or dietary supplements you use. Also tell them if you smoke, drink alcohol, or use illegal drugs. Some items may interact with your medicine.  What should I watch for while using this medicine?  You may need blood work done while you are taking this medicine.  If you are going to need a MRI, CT scan, or other procedure, tell your doctor that you are using this medicine (On-Body Injector only).  What side effects may I notice from receiving this medicine?  Side effects that you should report to   your doctor or health care professional as soon as possible:  -allergic reactions like skin rash, itching or hives, swelling of the face, lips, or tongue  -back pain  -dizziness  -fever  -pain, redness, or irritation at site where injected  -pinpoint red spots on the skin  -red or dark-brown urine  -shortness of breath or breathing problems  -stomach or side pain, or pain at the shoulder  -swelling  -tiredness  -trouble passing urine or  change in the amount of urine  Side effects that usually do not require medical attention (report to your doctor or health care professional if they continue or are bothersome):  -bone pain  -muscle pain  This list may not describe all possible side effects. Call your doctor for medical advice about side effects. You may report side effects to FDA at 1-800-FDA-1088.  Where should I keep my medicine?  Keep out of the reach of children.  If you are using this medicine at home, you will be instructed on how to store it. Throw away any unused medicine after the expiration date on the label.  NOTE: This sheet is a summary. It may not cover all possible information. If you have questions about this medicine, talk to your doctor, pharmacist, or health care provider.   2019 [employer]vier/Gold Standard (2017-09-11 16:57:08)

## 2018-06-25 ENCOUNTER — Telehealth: Payer: Self-pay | Admitting: Hematology and Oncology

## 2018-06-25 NOTE — Telephone Encounter (Signed)
No los °

## 2018-06-25 NOTE — Telephone Encounter (Signed)
The call kept dropping. No message left.

## 2018-06-26 NOTE — Progress Notes (Signed)
FMLA for mother, Reality Dejonge, successfully faxed to [employer] at [phone]. Mailed copy to patient address on file.

## 2018-06-27 NOTE — Telephone Encounter (Signed)
LM on VM that results are back and to please call.  Left CB instructions. 

## 2018-07-03 ENCOUNTER — Encounter: Payer: Self-pay | Admitting: Genetic Counselor

## 2018-07-03 NOTE — Telephone Encounter (Signed)
LM that results were back and to please call back.  Left CB instructions.

## 2018-07-06 NOTE — Progress Notes (Signed)
Patient Care Team: [name], DO as PCP - General (Family Medicine) [name], MD as Consulting Physician (General Surgery) [name], MD as Consulting Physician (Hematology and Oncology) Kyung Rudd, MD as Consulting Physician (Radiation Oncology)  DIAGNOSIS:    ICD-10-CM   1. Malignant neoplasm of upper-inner quadrant of right breast in female, estrogen receptor positive ([place]) C50.211    Z17.0     SUMMARY OF ONCOLOGIC HISTORY:   Malignant neoplasm of upper-inner quadrant of right breast in female, estrogen receptor positive ([place])   04/17/2018 Initial Diagnosis    Palpable right breast masses UOQ and UIQ with multiple masses 12 to 14 cm, at 1:00 1.6 cm, 1231.8 cm, and o'clock 4.5 cm and at 9:30 position 5 cm: Biopsy revealed IDC with DCIS, grade 3, ER 80%, PR 0%, HER-2 negative, Ki-67 20%; biopsy of the 4.5 cm mass came back as IDC grade 2-3, ER 50%, PR 5%, HER-2 -2+ ratio 1.6, copy #3.2, Ki-67 20%, T2N1 stage IIb    05/09/2018 -  Neo-Adjuvant Chemotherapy    Neoadjuvant chemotherapy with dose dense Adriamycin and Cytoxan x4 followed by Taxol weekly x12    06/19/2018 Genetic Testing    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  The Multi-Gene Panel offered by [facility] includes sequencing and/or deletion duplication testing of the following 84 genes: AIP, ALK, APC, ATM, AXIN2,BAP1,  BARD1, BLM, BMPR1A, BRCA1, BRCA2, BRIP1, CASR, CDC73, CDH1, CDK4, CDKN1B, CDKN1C, CDKN2A (p14ARF), CDKN2A (p16INK4a), CEBPA, CHEK2, CTNNA1, DICER1, DIS3L2, EGFR (c.2369C>T, p.Thr790Met variant only), EPCAM (Deletion/duplication testing only), FH, FLCN, GATA2, GPC3, GREM1 (Promoter region deletion/duplication testing only), HOXB13 (c.251G>A, p.Gly84Glu), HRAS, KIT, MAX, MEN1, MET, MITF (c.952G>A, p.Glu318Lys variant only), MLH1, MSH2, MSH3, MSH6, MUTYH, NBN, NF1, NF2, NTHL1, PALB2, PDGFRA, PHOX2B, PMS2, POLD1, POLE, POT1, PRKAR1A, PTCH1, PTEN, RAD50,  RAD51C, RAD51D, RB1, RECQL4, RET, RUNX1, SDHAF2, SDHA (sequence changes only), SDHB, SDHC, SDHD, SMAD4, SMARCA4, SMARCB1, SMARCE1, STK11, SUFU, TERC, TERT, TMEM127, TP53, TSC1, TSC2, VHL, WRN and WT1.  The report date is 06/19/2018.     CHIEF COMPLIANT: Cycle 1 Taxol  INTERVAL HISTORY: Miranda Ayers is a 33 y.o. with above-mentioned history of right breast cancer who started neoadjuvant chemotherapy on 05/09/18 with 4 cycles of Adriamycin and Cytoxan. She presents to the clinic today with her dad for Cycle 1 of Taxol. Her last treatment went well, but she reports severe indigestion that lasted a week. She reports darkening in her fingernails. Her labs from today show WBC 2.8, Hg 11.2, platelets 289, and neutrophils 1.6. She reports she received all her Udenyca injections. She reviewed her medication list with me.   REVIEW OF SYSTEMS:   Constitutional: Denies fevers, chills or abnormal weight loss (+) darkening in her fingrenails Eyes: Denies blurriness of vision Ears, nose, mouth, throat, and face: Denies mucositis or sore throat Respiratory: Denies cough, dyspnea or wheezes Cardiovascular: Denies palpitation, chest discomfort Gastrointestinal:  Denies nausea, heartburn (+) severe indigestion Skin: Denies abnormal skin rashes Lymphatics: Denies new lymphadenopathy or easy bruising Neurological: Denies numbness, tingling or new weaknesses Behavioral/Psych: Mood is stable, no new changes  Extremities: No lower extremity edema Breast: denies any pain or lumps or nodules in either breasts All other systems were reviewed with the patient and are negative.  I have reviewed the past medical history, past surgical history, social history and family history with the patient and they are unchanged from previous note.  ALLERGIES:  has No Known Allergies.  MEDICATIONS:  Current Outpatient Medications  Medication Sig Dispense Refill  . hydrochlorothiazide (HYDRODIURIL) 12.5 MG tablet Take 12.5 mg  by mouth daily.    Marland Kitchen ibuprofen (ADVIL,MOTRIN) 800 MG tablet Take 1 tablet (800 mg total) by mouth every 8 (eight) hours as needed. 30 tablet 0  . lidocaine-prilocaine (EMLA) cream Apply to affected area once 30 g 3  . LORazepam (ATIVAN) 0.5 MG tablet Take 1 tablet (0.5 mg total) by mouth at bedtime as needed (Nausea or vomiting). 30 tablet 0  . omeprazole (PRILOSEC) 40 MG capsule Take 1 capsule (40 mg total) by mouth daily. 30 capsule 0  . ondansetron (ZOFRAN) 8 MG tablet Take 1 tablet (8 mg total) by mouth 2 (two) times daily as needed. Start on the third day after chemotherapy. 30 tablet 1  . prochlorperazine (COMPAZINE) 10 MG tablet Take 1 tablet (10 mg total) by mouth every 6 (six) hours as needed (Nausea or vomiting). 30 tablet 1   No current facility-administered medications for this visit.    Facility-Administered Medications Ordered in Other Visits  Medication Dose Route Frequency Provider Last Rate Last Dose  . sodium chloride flush (NS) 0.9 % injection 10 mL  10 mL Intracatheter PRN [name], MD   10 mL at 07/10/18 5638    PHYSICAL EXAMINATION: ECOG PERFORMANCE STATUS: 1 - Symptomatic but completely ambulatory  Vitals:   07/10/18 0929  BP: (!) 152/96  Pulse: 95  Resp: 18  Temp: 98 F (36.7 C)  SpO2: 100%   Filed Weights   07/10/18 0929  Weight: 149 lb 4.8 oz (67.7 kg)    GENERAL: alert, no distress and comfortable SKIN: skin color, texture, turgor are normal, no rashes or significant lesions EYES: normal, Conjunctiva are pink and non-injected, sclera clear OROPHARYNX: no exudate, no erythema and lips, buccal mucosa, and tongue normal  NECK: supple, thyroid normal size, non-tender, without nodularity LYMPH: no palpable lymphadenopathy in the cervical, axillary or inguinal LUNGS: clear to auscultation and percussion with normal breathing effort HEART: regular rate & rhythm and no murmurs and no lower extremity edema ABDOMEN: abdomen soft, non-tender and normal  bowel sounds MUSCULOSKELETAL: no cyanosis of digits and no clubbing  NEURO: alert & oriented x 3 with fluent speech, no focal motor/sensory deficits EXTREMITIES: No lower extremity edema  LABORATORY DATA:  I have reviewed the data as listed CMP Latest Ref Rng & Units 06/22/2018 06/06/2018 05/23/2018  Glucose 70 - 99 mg/dL 89 78 92  BUN 6 - 20 mg/dL 7 8 7   Creatinine 0.44 - 1.00 mg/dL 0.68 0.68 0.74  Sodium 135 - 145 mmol/L 140 140 139  Potassium 3.5 - 5.1 mmol/L 4.0 3.8 3.7  Chloride 98 - 111 mmol/L 107 105 105  CO2 22 - 32 mmol/L 23 26 27   Calcium 8.9 - 10.3 mg/dL 9.6 9.3 10.0  Total Protein 6.5 - 8.1 g/dL 7.2 7.0 7.8  Total Bilirubin 0.3 - 1.2 mg/dL 0.3 <0.2(L) 0.2(L)  Alkaline Phos 38 - 126 U/L 60 69 89  AST 15 - 41 U/L 25 21 31   ALT 0 - 44 U/L 30 33 37    Lab Results  Component Value Date   WBC 2.8 (L) 07/10/2018   HGB 11.2 (L) 07/10/2018   HCT 35.3 (L) 07/10/2018   MCV 90.1 07/10/2018   PLT 289 07/10/2018   NEUTROABS 1.6 (L) 07/10/2018    ASSESSMENT & PLAN:  Malignant neoplasm of upper-inner quadrant of right breast in female, estrogen receptor positive ([place]) 04/17/2018:Palpable right breast masses  UOQ and UIQ with multiple masses 12 to 14 cm, at 1:00 1.6 cm, 1231.8 cm, and o'clock 4.5 cm and at 9:30 position 5 cm: Biopsy revealed IDC with DCIS, grade 3, ER 80%, PR 0%, HER-2 negative, Ki-67 20%; biopsy of the 4.5 cm mass came back as IDC grade 2-3, ER 50%, PR 5%, HER-2 -2+ ratio 1.6, copy #3.2, Ki-67 20%, T2N1 stage IIb  Recommendation: 1.Neoadjuvant chemotherapy with dose dense Adriamycinand Cytoxan x4 followed by Taxol weekly x12started 05/09/2018 2.followed byBilmastectomy and axillary lymph node dissection 3.Followed by radiation 4.Followed by adjuvant antiestrogen therapy ----------------------------------------------------------- Current treatment: Neoadjuvant dose dense Adriamycin and Cytoxan started 05/11/2018, today cycle 1 Taxol Chemo  toxicities: 1. Nausea and Vomiting: controlled with anti emetics 2. Indigestion:Omeprazole daily 3.Dysgeusia: recommended she try spicing her foods differently  Slightly decreased WBC count: It is unusual since she received Neulasta injection and her white count is low today.  We may have to watch her white count throughout her Taxol treatments to see if she needs a growth factor injection a few days prior to her treatment or do we may have to dose reduce her treatment.  Monitoring closely for chemo toxicities Labs were reviewed Return to clinic weekly for Taxol and every 2 weeks for follow-up with me.    No orders of the defined types were placed in this encounter.  The patient has a good understanding of the overall plan. she agrees with it. she will call with any problems that may develop before the next visit here.  [name], MD 07/10/2018  Julious Oka Dorshimer am acting as scribe for Dr. [name].  I have reviewed the above documentation for accuracy and completeness, and I agree with the above.

## 2018-07-10 ENCOUNTER — Encounter: Payer: Self-pay | Admitting: Hematology and Oncology

## 2018-07-10 ENCOUNTER — Inpatient Hospital Stay: Payer: BLUE CROSS/BLUE SHIELD

## 2018-07-10 ENCOUNTER — Telehealth: Payer: Self-pay

## 2018-07-10 ENCOUNTER — Inpatient Hospital Stay (HOSPITAL_BASED_OUTPATIENT_CLINIC_OR_DEPARTMENT_OTHER): Payer: BLUE CROSS/BLUE SHIELD | Admitting: Hematology and Oncology

## 2018-07-10 VITALS — BP 135/88 | HR 87 | Temp 98.3°F | Resp 18

## 2018-07-10 DIAGNOSIS — Z170 Estrogen receptor positive status [ER+]: Secondary | ICD-10-CM | POA: Diagnosis not present

## 2018-07-10 DIAGNOSIS — C50211 Malignant neoplasm of upper-inner quadrant of right female breast: Secondary | ICD-10-CM | POA: Diagnosis not present

## 2018-07-10 DIAGNOSIS — Z5111 Encounter for antineoplastic chemotherapy: Secondary | ICD-10-CM | POA: Diagnosis not present

## 2018-07-10 LAB — CMP (CANCER CENTER ONLY)
ALK PHOS: 53 U/L (ref 38–126)
ALT: 26 U/L (ref 0–44)
ANION GAP: 8 (ref 5–15)
AST: 25 U/L (ref 15–41)
Albumin: 3.9 g/dL (ref 3.5–5.0)
BUN: 5 mg/dL — ABNORMAL LOW (ref 6–20)
CALCIUM: 9.7 mg/dL (ref 8.9–10.3)
CO2: 25 mmol/L (ref 22–32)
Chloride: 107 mmol/L (ref 98–111)
Creatinine: 0.68 mg/dL (ref 0.44–1.00)
GFR, Est AFR Am: >60 mL/min (ref >60)
GFR, Estimated: >60 mL/min (ref >60)
Glucose, Bld: 89 mg/dL (ref 70–99)
Potassium: 3.8 mmol/L (ref 3.5–5.1)
Sodium: 140 mmol/L (ref 135–145)
Total Bilirubin: 0.4 mg/dL (ref 0.3–1.2)
Total Protein: 7.3 g/dL (ref 6.5–8.1)

## 2018-07-10 LAB — CBC WITH DIFFERENTIAL (CANCER CENTER ONLY)
Abs Immature Granulocytes: 0.01 10*3/uL (ref 0.00–0.07)
Basophils Absolute: 0 10*3/uL (ref 0.0–0.1)
Basophils Relative: 1 %
Eosinophils Absolute: 0 10*3/uL (ref 0.0–0.5)
Eosinophils Relative: 1 %
HCT: 35.3 % — ABNORMAL LOW (ref 36.0–46.0)
Hemoglobin: 11.2 g/dL — ABNORMAL LOW (ref 12.0–15.0)
Immature Granulocytes: 0 %
Lymphocytes Relative: 23 %
Lymphs Abs: 0.6 10*3/uL — ABNORMAL LOW (ref 0.7–4.0)
MCH: 28.6 pg (ref 26.0–34.0)
MCHC: 31.7 g/dL (ref 30.0–36.0)
MCV: 90.1 fL (ref 80.0–100.0)
Monocytes Absolute: 0.5 10*3/uL (ref 0.1–1.0)
Monocytes Relative: 19 %
NEUTROS PCT: 56 %
Neutro Abs: 1.6 10*3/uL — ABNORMAL LOW (ref 1.7–7.7)
Platelet Count: 289 10*3/uL (ref 150–400)
RBC: 3.92 MIL/uL (ref 3.87–5.11)
RDW: 16.1 % — AB (ref 11.5–15.5)
WBC Count: 2.8 10*3/uL — ABNORMAL LOW (ref 4.0–10.5)
nRBC: 0 % (ref 0.0–0.2)

## 2018-07-10 MED ORDER — FAMOTIDINE IN NACL 20-0.9 MG/50ML-% IV SOLN
INTRAVENOUS | Status: AC
  Filled 2018-07-10: qty 50

## 2018-07-10 MED ORDER — DIPHENHYDRAMINE HCL 50 MG/ML IJ SOLN
50.0000 mg | Freq: Once | INTRAMUSCULAR | Status: AC
  Administered 2018-07-10: 50 mg via INTRAVENOUS

## 2018-07-10 MED ORDER — HEPARIN SOD (PORK) LOCK FLUSH 100 UNIT/ML IV SOLN
500.0000 [IU] | Freq: Once | INTRAVENOUS | Status: AC | PRN
  Administered 2018-07-10: 500 [IU]
  Filled 2018-07-10: qty 5

## 2018-07-10 MED ORDER — SODIUM CHLORIDE 0.9% FLUSH
10.0000 mL | INTRAVENOUS | Status: DC | PRN
  Administered 2018-07-10: 10 mL
  Filled 2018-07-10: qty 10

## 2018-07-10 MED ORDER — SODIUM CHLORIDE 0.9 % IV SOLN
Freq: Once | INTRAVENOUS | Status: AC
  Administered 2018-07-10: 10:00:00 via INTRAVENOUS
  Filled 2018-07-10: qty 250

## 2018-07-10 MED ORDER — SODIUM CHLORIDE 0.9 % IV SOLN
20.0000 mg | Freq: Once | INTRAVENOUS | Status: AC
  Administered 2018-07-10: 20 mg via INTRAVENOUS
  Filled 2018-07-10: qty 2

## 2018-07-10 MED ORDER — SODIUM CHLORIDE 0.9 % IV SOLN
80.0000 mg/m2 | Freq: Once | INTRAVENOUS | Status: AC
  Administered 2018-07-10: 138 mg via INTRAVENOUS
  Filled 2018-07-10: qty 23

## 2018-07-10 MED ORDER — FAMOTIDINE IN NACL 20-0.9 MG/50ML-% IV SOLN
20.0000 mg | Freq: Once | INTRAVENOUS | Status: AC
  Administered 2018-07-10: 20 mg via INTRAVENOUS

## 2018-07-10 MED ORDER — DIPHENHYDRAMINE HCL 50 MG/ML IJ SOLN
INTRAMUSCULAR | Status: AC
  Filled 2018-07-10: qty 1

## 2018-07-10 NOTE — Patient Instructions (Signed)
[facility] Discharge Instructions for Patients Receiving Chemotherapy  Today you received the following chemotherapy agents: Taxol.  To help prevent nausea and vomiting after your treatment, we encourage you to take your nausea medication as prescribed.   If you develop nausea and vomiting that is not controlled by your nausea medication, call the clinic.   BELOW ARE SYMPTOMS THAT SHOULD BE REPORTED IMMEDIATELY:  *FEVER GREATER THAN 100.5 F  *CHILLS WITH OR WITHOUT FEVER  NAUSEA AND VOMITING THAT IS NOT CONTROLLED WITH YOUR NAUSEA MEDICATION  *UNUSUAL SHORTNESS OF BREATH  *UNUSUAL BRUISING OR BLEEDING  TENDERNESS IN MOUTH AND THROAT WITH OR WITHOUT PRESENCE OF ULCERS  *URINARY PROBLEMS  *BOWEL PROBLEMS  UNUSUAL RASH Items with * indicate a potential emergency and should be followed up as soon as possible.  Feel free to call the clinic should you have any questions or concerns. The clinic phone number is [phone].  Please show the [facility] at check-in to the Emergency Department and triage nurse.  Paclitaxel injection What is this medicine? PACLITAXEL (PAK li TAX el) is a chemotherapy drug. It targets fast dividing cells, like cancer cells, and causes these cells to die. This medicine is used to treat ovarian cancer, breast cancer, lung cancer, Kaposi's sarcoma, and other cancers. This medicine may be used for other purposes; ask your health care provider or pharmacist if you have questions. COMMON BRAND NAME(S): Onxol, Taxol What should I tell my health care provider before I take this medicine? They need to know if you have any of these conditions: -history of irregular heartbeat -liver disease -low blood counts, like low white cell, platelet, or red cell counts -lung or breathing disease, like asthma -tingling of the fingers or toes, or other nerve disorder -an unusual or allergic reaction to paclitaxel, alcohol, polyoxyethylated castor  oil, other chemotherapy, other medicines, foods, dyes, or preservatives -pregnant or trying to get pregnant -breast-feeding How should I use this medicine? This drug is given as an infusion into a vein. It is administered in a hospital or clinic by a specially trained health care professional. Talk to your pediatrician regarding the use of this medicine in children. Special care may be needed. Overdosage: If you think you have taken too much of this medicine contact a poison control center or emergency room at once. NOTE: This medicine is only for you. Do not share this medicine with others. What if I miss a dose? It is important not to miss your dose. Call your doctor or health care professional if you are unable to keep an appointment. What may interact with this medicine? Do not take this medicine with any of the following medications: -disulfiram -metronidazole This medicine may also interact with the following medications: -antiviral medicines for hepatitis, HIV or AIDS -certain antibiotics like erythromycin and clarithromycin -certain medicines for fungal infections like ketoconazole and itraconazole -certain medicines for seizures like carbamazepine, phenobarbital, phenytoin -gemfibrozil -nefazodone -rifampin -St. John's wort This list may not describe all possible interactions. Give your health care provider a list of all the medicines, herbs, non-prescription drugs, or dietary supplements you use. Also tell them if you smoke, drink alcohol, or use illegal drugs. Some items may interact with your medicine. What should I watch for while using this medicine? Your condition will be monitored carefully while you are receiving this medicine. You will need important blood work done while you are taking this medicine. This medicine can cause serious allergic reactions. To reduce your risk  you will need to take other medicine(s) before treatment with this medicine. If you experience  allergic reactions like skin rash, itching or hives, swelling of the face, lips, or tongue, tell your doctor or health care professional right away. In some cases, you may be given additional medicines to help with side effects. Follow all directions for their use. This drug may make you feel generally unwell. This is not uncommon, as chemotherapy can affect healthy cells as well as cancer cells. Report any side effects. Continue your course of treatment even though you feel ill unless your doctor tells you to stop. Call your doctor or health care professional for advice if you get a fever, chills or sore throat, or other symptoms of a cold or flu. Do not treat yourself. This drug decreases your body's ability to fight infections. Try to avoid being around people who are sick. This medicine may increase your risk to bruise or bleed. Call your doctor or health care professional if you notice any unusual bleeding. Be careful brushing and flossing your teeth or using a toothpick because you may get an infection or bleed more easily. If you have any dental work done, tell your dentist you are receiving this medicine. Avoid taking products that contain aspirin, acetaminophen, ibuprofen, naproxen, or ketoprofen unless instructed by your doctor. These medicines may hide a fever. Do not become pregnant while taking this medicine. Women should inform their doctor if they wish to become pregnant or think they might be pregnant. There is a potential for serious side effects to an unborn child. Talk to your health care professional or pharmacist for more information. Do not breast-feed an infant while taking this medicine. Men are advised not to father a child while receiving this medicine. This product may contain alcohol. Ask your pharmacist or healthcare provider if this medicine contains alcohol. Be sure to tell all healthcare providers you are taking this medicine. Certain medicines, like metronidazole and  disulfiram, can cause an unpleasant reaction when taken with alcohol. The reaction includes flushing, headache, nausea, vomiting, sweating, and increased thirst. The reaction can last from 30 minutes to several hours. What side effects may I notice from receiving this medicine? Side effects that you should report to your doctor or health care professional as soon as possible: -allergic reactions like skin rash, itching or hives, swelling of the face, lips, or tongue -breathing problems -changes in vision -fast, irregular heartbeat -high or low blood pressure -mouth sores -pain, tingling, numbness in the hands or feet -signs of decreased platelets or bleeding - bruising, pinpoint red spots on the skin, black, tarry stools, blood in the urine -signs of decreased red blood cells - unusually weak or tired, feeling faint or lightheaded, falls -signs of infection - fever or chills, cough, sore throat, pain or difficulty passing urine -signs and symptoms of liver injury like dark yellow or brown urine; general ill feeling or flu-like symptoms; light-colored stools; loss of appetite; nausea; right upper belly pain; unusually weak or tired; yellowing of the eyes or skin -swelling of the ankles, feet, hands -unusually slow heartbeat Side effects that usually do not require medical attention (report to your doctor or health care professional if they continue or are bothersome): -diarrhea -hair loss -loss of appetite -muscle or joint pain -nausea, vomiting -pain, redness, or irritation at site where injected -tiredness This list may not describe all possible side effects. Call your doctor for medical advice about side effects. You may report side effects to FDA  at 1-800-FDA-1088. Where should I keep my medicine? This drug is given in a hospital or clinic and will not be stored at home. NOTE: This sheet is a summary. It may not cover all possible information. If you have questions about this medicine,  talk to your doctor, pharmacist, or health care provider.  2019 [employer]vier/Gold Standard (2017-02-07 13:14:55)

## 2018-07-10 NOTE — Assessment & Plan Note (Signed)
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%, PR 0%, HER-2 negative, Ki-67 20%; biopsy of the 4.5 cm mass came back as IDC grade 2-3, ER 50%, PR 5%, HER-2 -2+ ratio 1.6, copy #3.2, Ki-67 20%, T2N1 stage IIb  Recommendation: 1.Neoadjuvant chemotherapy with dose dense Adriamycinand Cytoxan x4 followed by Taxol weekly x12started 05/09/2018 2.followed byBilmastectomy and axillary lymph node dissection 3.Followed by radiation 4.Followed by adjuvant antiestrogen therapy ----------------------------------------------------------- Current treatment: Neoadjuvant dose dense Adriamycin and Cytoxan started 05/11/2018, today cycle 1 Taxol Chemo toxicities: 1. Nausea and Vomiting: controlled with anti emetics 2. Indigestion:Omeprazole daily 3.Dysgeusia: recommended she try spicing her foods differently 4. Flu Exposure: Tamiflu 74m daily x 10 days as prophylaxis  Monitoring closely for chemo toxicities Labs were reviewed Return to clinic weekly for Taxol and every 2 weeks for follow-up with me.

## 2018-07-12 ENCOUNTER — Other Ambulatory Visit: Payer: Self-pay

## 2018-07-17 ENCOUNTER — Inpatient Hospital Stay: Payer: BLUE CROSS/BLUE SHIELD

## 2018-07-17 ENCOUNTER — Inpatient Hospital Stay (HOSPITAL_BASED_OUTPATIENT_CLINIC_OR_DEPARTMENT_OTHER): Payer: BLUE CROSS/BLUE SHIELD | Admitting: Adult Health

## 2018-07-17 ENCOUNTER — Encounter: Payer: Self-pay | Admitting: Hematology and Oncology

## 2018-07-17 ENCOUNTER — Encounter: Payer: Self-pay | Admitting: Adult Health

## 2018-07-17 VITALS — BP 128/72 | HR 110 | Temp 98.4°F | Resp 18 | Ht 61.0 in | Wt 147.6 lb

## 2018-07-17 DIAGNOSIS — C50211 Malignant neoplasm of upper-inner quadrant of right female breast: Secondary | ICD-10-CM

## 2018-07-17 DIAGNOSIS — Z170 Estrogen receptor positive status [ER+]: Secondary | ICD-10-CM | POA: Diagnosis not present

## 2018-07-17 DIAGNOSIS — Z5111 Encounter for antineoplastic chemotherapy: Secondary | ICD-10-CM | POA: Diagnosis not present

## 2018-07-17 LAB — CMP (CANCER CENTER ONLY)
ALBUMIN: 4.2 g/dL (ref 3.5–5.0)
ALT: 28 U/L (ref 0–44)
AST: 22 U/L (ref 15–41)
Alkaline Phosphatase: 49 U/L (ref 38–126)
Anion gap: 8 (ref 5–15)
BUN: 8 mg/dL (ref 6–20)
CO2: 26 mmol/L (ref 22–32)
Calcium: 9.8 mg/dL (ref 8.9–10.3)
Chloride: 109 mmol/L (ref 98–111)
Creatinine: 0.76 mg/dL (ref 0.44–1.00)
GFR, Est AFR Am: >60 mL/min (ref >60)
GFR, Estimated: >60 mL/min (ref >60)
Glucose, Bld: 90 mg/dL (ref 70–99)
Potassium: 3.8 mmol/L (ref 3.5–5.1)
SODIUM: 143 mmol/L (ref 135–145)
Total Bilirubin: 0.4 mg/dL (ref 0.3–1.2)
Total Protein: 7.6 g/dL (ref 6.5–8.1)

## 2018-07-17 LAB — CBC WITH DIFFERENTIAL (CANCER CENTER ONLY)
Abs Immature Granulocytes: 0.02 10*3/uL (ref 0.00–0.07)
Basophils Absolute: 0.1 10*3/uL (ref 0.0–0.1)
Basophils Relative: 2 %
Eosinophils Absolute: 0.1 10*3/uL (ref 0.0–0.5)
Eosinophils Relative: 2 %
HCT: 34.1 % — ABNORMAL LOW (ref 36.0–46.0)
Hemoglobin: 11.1 g/dL — ABNORMAL LOW (ref 12.0–15.0)
Immature Granulocytes: 1 %
LYMPHS ABS: 0.5 10*3/uL — AB (ref 0.7–4.0)
Lymphocytes Relative: 20 %
MCH: 29.2 pg (ref 26.0–34.0)
MCHC: 32.6 g/dL (ref 30.0–36.0)
MCV: 89.7 fL (ref 80.0–100.0)
Monocytes Absolute: 0.5 10*3/uL (ref 0.1–1.0)
Monocytes Relative: 17 %
Neutro Abs: 1.6 10*3/uL — ABNORMAL LOW (ref 1.7–7.7)
Neutrophils Relative %: 58 %
Platelet Count: 254 10*3/uL (ref 150–400)
RBC: 3.8 MIL/uL — ABNORMAL LOW (ref 3.87–5.11)
RDW: 16.3 % — ABNORMAL HIGH (ref 11.5–15.5)
WBC Count: 2.8 10*3/uL — ABNORMAL LOW (ref 4.0–10.5)
nRBC: 0 % (ref 0.0–0.2)

## 2018-07-17 MED ORDER — SODIUM CHLORIDE 0.9% FLUSH
10.0000 mL | INTRAVENOUS | Status: DC | PRN
  Administered 2018-07-17: 10 mL
  Filled 2018-07-17: qty 10

## 2018-07-17 MED ORDER — DIPHENHYDRAMINE HCL 50 MG/ML IJ SOLN
INTRAMUSCULAR | Status: AC
  Filled 2018-07-17: qty 1

## 2018-07-17 MED ORDER — SODIUM CHLORIDE 0.9 % IV SOLN
20.0000 mg | Freq: Once | INTRAVENOUS | Status: AC
  Administered 2018-07-17: 20 mg via INTRAVENOUS
  Filled 2018-07-17: qty 2

## 2018-07-17 MED ORDER — FAMOTIDINE IN NACL 20-0.9 MG/50ML-% IV SOLN
INTRAVENOUS | Status: AC
  Filled 2018-07-17: qty 50

## 2018-07-17 MED ORDER — HEPARIN SOD (PORK) LOCK FLUSH 100 UNIT/ML IV SOLN
500.0000 [IU] | Freq: Once | INTRAVENOUS | Status: AC | PRN
  Administered 2018-07-17: 500 [IU]
  Filled 2018-07-17: qty 5

## 2018-07-17 MED ORDER — DIPHENHYDRAMINE HCL 50 MG/ML IJ SOLN
50.0000 mg | Freq: Once | INTRAMUSCULAR | Status: AC
  Administered 2018-07-17: 50 mg via INTRAVENOUS

## 2018-07-17 MED ORDER — SODIUM CHLORIDE 0.9 % IV SOLN
80.0000 mg/m2 | Freq: Once | INTRAVENOUS | Status: AC
  Administered 2018-07-17: 138 mg via INTRAVENOUS
  Filled 2018-07-17: qty 23

## 2018-07-17 MED ORDER — FAMOTIDINE IN NACL 20-0.9 MG/50ML-% IV SOLN
20.0000 mg | Freq: Once | INTRAVENOUS | Status: AC
  Administered 2018-07-17: 20 mg via INTRAVENOUS

## 2018-07-17 MED ORDER — SODIUM CHLORIDE 0.9 % IV SOLN
Freq: Once | INTRAVENOUS | Status: AC
  Administered 2018-07-17: 10:00:00 via INTRAVENOUS
  Filled 2018-07-17: qty 250

## 2018-07-17 NOTE — Patient Instructions (Signed)
[facility] Discharge Instructions for Patients Receiving Chemotherapy  Today you received the following chemotherapy agents:  Taxol.  To help prevent nausea and vomiting after your treatment, we encourage you to take your nausea medication as directed.   If you develop nausea and vomiting that is not controlled by your nausea medication, call the clinic.   BELOW ARE SYMPTOMS THAT SHOULD BE REPORTED IMMEDIATELY:  *FEVER GREATER THAN 100.5 F  *CHILLS WITH OR WITHOUT FEVER  NAUSEA AND VOMITING THAT IS NOT CONTROLLED WITH YOUR NAUSEA MEDICATION  *UNUSUAL SHORTNESS OF BREATH  *UNUSUAL BRUISING OR BLEEDING  TENDERNESS IN MOUTH AND THROAT WITH OR WITHOUT PRESENCE OF ULCERS  *URINARY PROBLEMS  *BOWEL PROBLEMS  UNUSUAL RASH Items with * indicate a potential emergency and should be followed up as soon as possible.  Feel free to call the clinic should you have any questions or concerns. The clinic phone number is [phone].  Please show the CHEMO ALERT CARD at check-in to the Emergency Department and triage nurse.   

## 2018-07-17 NOTE — Assessment & Plan Note (Addendum)
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%, PR 0%, HER-2 negative, Ki-67 20%; biopsy of the 4.5 cm mass came back as IDC grade 2-3, ER 50%, PR 5%, HER-2 -2+ ratio 1.6, copy #3.2, Ki-67 20%, T2N1 stage IIb  Recommendation: 1.Neoadjuvant chemotherapy with dose dense Adriamycinand Cytoxan x4 followed by Taxol weekly x12started 05/09/2018 2.followed byBilmastectomy and axillary lymph node dissection 3.Followed by radiation 4.Followed by adjuvant antiestrogen therapy ----------------------------------------------------------- Current treatment: Neoadjuvant dose dense Adriamycin and Cytoxan started 05/11/2018, today cycle 2 Taxol Chemo toxicities: 1.Dysgeusia: she is managing this well.   2. Menses: Reviewed Zoladex and gave her information, as she would benefit from this since she has premenopausal ER positive breast cancer, and it would also stop her cycles.    She is doing well otherwise.  No peripheral neuropathy at this point.  Monitoring closely for chemo toxicities Labs were reviewed Return to clinic weekly for Taxol and every 2 weeks for follow-up with me or Dr. Lindi Adie.

## 2018-07-17 NOTE — Patient Instructions (Signed)
Goserelin injection What is this medicine? GOSERELIN (GOE se rel in) is similar to a hormone found in the body. It lowers the amount of sex hormones that the body makes. Men will have lower testosterone levels and women will have lower estrogen levels while taking this medicine. In men, this medicine is used to treat prostate cancer; the injection is either given once per month or once every 12 weeks. A once per month injection (only) is used to treat women with endometriosis, dysfunctional uterine bleeding, or advanced breast cancer. This medicine may be used for other purposes; ask your health care provider or pharmacist if you have questions. COMMON BRAND NAME(S): Zoladex What should I tell my health care provider before I take this medicine? They need to know if you have any of these conditions (some only apply to women): -diabetes -heart disease or previous heart attack -high blood pressure -high cholesterol -kidney disease -osteoporosis or low bone density -problems passing urine -spinal cord injury -stroke -tobacco smoker -an unusual or allergic reaction to goserelin, hormone therapy, other medicines, foods, dyes, or preservatives -pregnant or trying to get pregnant -breast-feeding How should I use this medicine? This medicine is for injection under the skin. It is given by a health care professional in a hospital or clinic setting. Men receive this injection once every 4 weeks or once every 12 weeks. Women will only receive the once every 4 weeks injection. Talk to your pediatrician regarding the use of this medicine in children. Special care may be needed. Overdosage: If you think you have taken too much of this medicine contact a poison control center or emergency room at once. NOTE: This medicine is only for you. Do not share this medicine with others. What if I miss a dose? It is important not to miss your dose. Call your doctor or health care professional if you are unable to  keep an appointment. What may interact with this medicine? -female hormones like estrogen -herbal or dietary supplements like black cohosh, chasteberry, or DHEA -female hormones like testosterone -prasterone This list may not describe all possible interactions. Give your health care provider a list of all the medicines, herbs, non-prescription drugs, or dietary supplements you use. Also tell them if you smoke, drink alcohol, or use illegal drugs. Some items may interact with your medicine. What should I watch for while using this medicine? Visit your doctor or health care professional for regular checks on your progress. Your symptoms may appear to get worse during the first weeks of this therapy. Tell your doctor or healthcare professional if your symptoms do not start to get better or if they get worse after this time. Your bones may get weaker if you take this medicine for a long time. If you smoke or frequently drink alcohol you may increase your risk of bone loss. A family history of osteoporosis, chronic use of drugs for seizures (convulsions), or corticosteroids can also increase your risk of bone loss. Talk to your doctor about how to keep your bones strong. This medicine should stop regular monthly menstration in women. Tell your doctor if you continue to menstrate. Women should not become pregnant while taking this medicine or for 12 weeks after stopping this medicine. Women should inform their doctor if they wish to become pregnant or think they might be pregnant. There is a potential for serious side effects to an unborn child. Talk to your health care professional or pharmacist for more information. Do not breast-feed an infant while taking   this medicine. Men should inform their doctors if they wish to father a child. This medicine may lower sperm counts. Talk to your health care professional or pharmacist for more information. What side effects may I notice from receiving this  medicine? Side effects that you should report to your doctor or health care professional as soon as possible: -allergic reactions like skin rash, itching or hives, swelling of the face, lips, or tongue -bone pain -breathing problems -changes in vision -chest pain -feeling faint or lightheaded, falls -fever, chills -pain, swelling, warmth in the leg -pain, tingling, numbness in the hands or feet -signs and symptoms of low blood pressure like dizziness; feeling faint or lightheaded, falls; unusually weak or tired -stomach pain -swelling of the ankles, feet, hands -trouble passing urine or change in the amount of urine -unusually high or low blood pressure -unusually weak or tired Side effects that usually do not require medical attention (report to your doctor or health care professional if they continue or are bothersome): -change in sex drive or performance -changes in breast size in both males and females -changes in emotions or moods -headache -hot flashes -irritation at site where injected -loss of appetite -skin problems like acne, dry skin -vaginal dryness This list may not describe all possible side effects. Call your doctor for medical advice about side effects. You may report side effects to FDA at 1-800-FDA-1088. Where should I keep my medicine? This drug is given in a hospital or clinic and will not be stored at home. NOTE: This sheet is a summary. It may not cover all possible information. If you have questions about this medicine, talk to your doctor, pharmacist, or health care provider.  2019 Elsevier/Gold Standard (2013-08-13 11:10:35)  

## 2018-07-17 NOTE — Progress Notes (Signed)
[facility] Follow up:    Miranda Ayers, [place] [address]   DIAGNOSIS: Cancer Staging Malignant neoplasm of upper-inner quadrant of right breast in female, estrogen receptor positive ([place]) Staging form: Breast, AJCC 8th Edition - Clinical: Stage IIIA (cT2, cN1, cM0, G3, ER+, PR-, HER2-) - Unsigned   SUMMARY OF ONCOLOGIC HISTORY:   Malignant neoplasm of upper-inner quadrant of right breast in female, estrogen receptor positive ([place])   04/17/2018 Initial Diagnosis    Palpable right breast masses UOQ and UIQ with multiple masses 12 to 14 cm, at 1:00 1.6 cm, 1231.8 cm, and o'clock 4.5 cm and at 9:30 position 5 cm: Biopsy revealed IDC with DCIS, grade 3, ER 80%, PR 0%, HER-2 negative, Ki-67 20%; biopsy of the 4.5 cm mass came back as IDC grade 2-3, ER 50%, PR 5%, HER-2 -2+ ratio 1.6, copy #3.2, Ki-67 20%, T2N1 stage IIb    05/09/2018 -  Neo-Adjuvant Chemotherapy    Neoadjuvant chemotherapy with dose dense Adriamycin and Cytoxan x4 followed by Taxol weekly x12    06/19/2018 Genetic Testing    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  The Multi-Gene Panel offered by [facility] includes sequencing and/or deletion duplication testing of the following 84 genes: AIP, ALK, APC, ATM, AXIN2,BAP1,  BARD1, BLM, BMPR1A, BRCA1, BRCA2, BRIP1, CASR, CDC73, CDH1, CDK4, CDKN1B, CDKN1C, CDKN2A (p14ARF), CDKN2A (p16INK4a), CEBPA, CHEK2, CTNNA1, DICER1, DIS3L2, EGFR (c.2369C>T, p.Thr790Met variant only), EPCAM (Deletion/duplication testing only), FH, FLCN, GATA2, GPC3, GREM1 (Promoter region deletion/duplication testing only), HOXB13 (c.251G>A, p.Gly84Glu), HRAS, KIT, MAX, MEN1, MET, MITF (c.952G>A, p.Glu318Lys variant only), MLH1, MSH2, MSH3, MSH6, MUTYH, NBN, NF1, NF2, NTHL1, PALB2, PDGFRA, PHOX2B, PMS2, POLD1, POLE, POT1, PRKAR1A, PTCH1, PTEN, RAD50, RAD51C, RAD51D, RB1, RECQL4, RET, RUNX1, SDHAF2, SDHA (sequence changes  only), SDHB, SDHC, SDHD, SMAD4, SMARCA4, SMARCB1, SMARCE1, STK11, SUFU, TERC, TERT, TMEM127, TP53, TSC1, TSC2, VHL, WRN and WT1.  The report date is 06/19/2018.     CURRENT THERAPY: Taxol weekly  INTERVAL HISTORY: Miranda Ayers 33 y.o. female returns for evaluation prior to receiving her weekly Taxol.  She is doing well otday.  She tolerated her first dose of Taxol well last week.  She denies any peripheral neuropathy.  She is slightly fatigued.  She notes some taste changes, but just avoids foods that have changed for her.  Her indigestion has improved.  Miranda Ayers did have a menstrual cycle last week.  She was surprised.  She notes that she had a two pad count per day initially and on days 1 and 2 and then it tapered off.  She had her mirena pulled yesterday and has had intermittent vaginal spotting since.  She is doing well otherwise.     Patient Active Problem List   Diagnosis Date Noted  . Genetic testing 06/22/2018  . Family history of lung cancer   . Family history of colon cancer   . Family history of pancreatic cancer   . Malignant neoplasm of upper-inner quadrant of right breast in female, estrogen receptor positive ([place]) 04/19/2018    has No Known Allergies.  MEDICAL HISTORY: Past Medical History:  Diagnosis Date  . Cancer [facility])    breast cancer  . Family history of colon cancer   . Family history of lung cancer   . Family history of pancreatic cancer   . Hypertension     SURGICAL HISTORY: Past Surgical History:  Procedure Laterality Date  .  OOPHORECTOMY  2013  . PORTACATH PLACEMENT N/A 05/08/2018   Procedure: INSERTION PORT-A-CATH WITH ULTRASOUND;  Surgeon: [name], MD;  Location: [facility];  Service: General;  Laterality: N/A;    SOCIAL HISTORY: Social History   Socioeconomic History  . Marital status: Single    Spouse name: Not on file  . Number of children: Not on file  . Years of education: Not on file  . Highest education level:  Not on file  Occupational History  . Not on file  Social Needs  . Financial resource strain: Not on file  . Food insecurity:    Worry: Not on file    Inability: Not on file  . Transportation needs:    Medical: Not on file    Non-medical: Not on file  Tobacco Use  . Smoking status: Never Smoker  . Smokeless tobacco: Never Used  Substance and Sexual Activity  . Alcohol use: Never    Frequency: Never  . Drug use: Never  . Sexual activity: Not on file  Lifestyle  . Physical activity:    Days per week: Not on file    Minutes per session: Not on file  . Stress: Not on file  Relationships  . Social connections:    Talks on phone: Not on file    Gets together: Not on file    Attends religious service: Not on file    Active member of club or organization: Not on file    Attends meetings of clubs or organizations: Not on file    Relationship status: Not on file  . Intimate partner violence:    Fear of current or ex partner: Not on file    Emotionally abused: Not on file    Physically abused: Not on file    Forced sexual activity: Not on file  Other Topics Concern  . Not on file  Social History Narrative  . Not on file    FAMILY HISTORY: Family History  Problem Relation Age of Onset  . Lung cancer Maternal Grandmother   . Lung cancer Maternal Grandfather   . Colon cancer Maternal Grandfather   . Stomach cancer Paternal Grandfather   . Lung cancer Maternal Aunt   . Pancreatic cancer Maternal Aunt   . Lung cancer Maternal Uncle   . Cancer Cousin        childhood cancer in mat first cousin    Review of Systems  Constitutional: Negative for appetite change, chills, fatigue, fever and unexpected weight change.  HENT:   Negative for hearing loss, lump/mass and sore throat.   Eyes: Negative for eye problems and icterus.  Respiratory: Negative for chest tightness, cough and shortness of breath.   Cardiovascular: Negative for chest pain, leg swelling and palpitations.   Gastrointestinal: Negative for abdominal distention, abdominal pain, constipation, diarrhea, nausea and vomiting.  Endocrine: Negative for hot flashes.  Genitourinary: Negative for difficulty urinating.   Skin: Negative for itching and rash.  Neurological: Negative for dizziness, extremity weakness and headaches.  Hematological: Negative for adenopathy. Does not bruise/bleed easily.  Psychiatric/Behavioral: Negative for depression. The patient is not nervous/anxious.       PHYSICAL EXAMINATION  ECOG PERFORMANCE STATUS: 1 - Symptomatic but completely ambulatory  Vitals:   07/17/18 0940  BP: 128/72  Pulse: (!) 110  Resp: 18  Temp: 98.4 F (36.9 C)  SpO2: 100%    Physical Exam Constitutional:      General: She is not in acute distress.  Appearance: Normal appearance. She is not toxic-appearing.  HENT:     Head: Normocephalic and atraumatic.     Mouth/Throat:     Mouth: Mucous membranes are moist.     Pharynx: Oropharynx is clear. No oropharyngeal exudate or posterior oropharyngeal erythema.  Eyes:     General: No scleral icterus.    Pupils: Pupils are equal, round, and reactive to light.  Neck:     Musculoskeletal: Neck supple.  Cardiovascular:     Rate and Rhythm: Normal rate and regular rhythm.     Pulses: Normal pulses.     Heart sounds: Normal heart sounds.  Pulmonary:     Effort: Pulmonary effort is normal.     Breath sounds: Normal breath sounds.  Abdominal:     General: Abdomen is flat. Bowel sounds are normal. There is no distension.     Palpations: Abdomen is soft.     Tenderness: There is no abdominal tenderness.  Musculoskeletal:        General: No swelling.  Lymphadenopathy:     Cervical: No cervical adenopathy.  Skin:    General: Skin is warm and dry.     Findings: No rash.  Neurological:     General: No focal deficit present.     Mental Status: She is alert.  Psychiatric:        Mood and Affect: Mood normal.        Behavior: Behavior normal.      LABORATORY DATA:  CBC    Component Value Date/Time   WBC 2.8 (L) 07/17/2018 0914   RBC 3.80 (L) 07/17/2018 0914   HGB 11.1 (L) 07/17/2018 0914   HCT 34.1 (L) 07/17/2018 0914   PLT 254 07/17/2018 0914   MCV 89.7 07/17/2018 0914   MCH 29.2 07/17/2018 0914   MCHC 32.6 07/17/2018 0914   RDW 16.3 (H) 07/17/2018 0914   LYMPHSABS 0.5 (L) 07/17/2018 0914   MONOABS 0.5 07/17/2018 0914   EOSABS 0.1 07/17/2018 0914   BASOSABS 0.1 07/17/2018 0914    CMP     Component Value Date/Time   NA 143 07/17/2018 0914   K 3.8 07/17/2018 0914   CL 109 07/17/2018 0914   CO2 26 07/17/2018 0914   GLUCOSE 90 07/17/2018 0914   BUN 8 07/17/2018 0914   CREATININE 0.76 07/17/2018 0914   CALCIUM 9.8 07/17/2018 0914   PROT 7.6 07/17/2018 0914   ALBUMIN 4.2 07/17/2018 0914   AST 22 07/17/2018 0914   ALT 28 07/17/2018 0914   ALKPHOS 49 07/17/2018 0914   BILITOT 0.4 07/17/2018 0914   GFRNONAA >60 07/17/2018 0914   GFRAA >60 07/17/2018 0914          ASSESSMENT and PLAN:   Malignant neoplasm of upper-inner quadrant of right breast in female, estrogen receptor positive ([place]) 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%, PR 0%, HER-2 negative, Ki-67 20%; biopsy of the 4.5 cm mass came back as IDC grade 2-3, ER 50%, PR 5%, HER-2 -2+ ratio 1.6, copy #3.2, Ki-67 20%, T2N1 stage IIb  Recommendation: 1.Neoadjuvant chemotherapy with dose dense Adriamycinand Cytoxan x4 followed by Taxol weekly x12started 05/09/2018 2.followed byBilmastectomy and axillary lymph node dissection 3.Followed by radiation 4.Followed by adjuvant antiestrogen therapy ----------------------------------------------------------- Current treatment: Neoadjuvant dose dense Adriamycin and Cytoxan started 05/11/2018, today cycle 2 Taxol Chemo  toxicities: 1.Dysgeusia: she is managing this well.   2. Menses: Reviewed Zoladex and gave her information, as she would benefit from this since she has premenopausal ER positive breast cancer, and it would also stop her cycles.    She is doing well otherwise.  No peripheral neuropathy at this point.  Monitoring closely for chemo toxicities Labs were reviewed Return to clinic weekly for Taxol and every 2 weeks for follow-up with me or Dr. Lindi Adie.     All questions were answered. The patient knows to call the clinic with any problems, questions or concerns. We can certainly see the patient much sooner if necessary.  A total of (30) minutes of face-to-face time was spent with this patient with greater than 50% of that time in counseling and care-coordination.  This note was electronically signed. Scot Dock, NP 07/17/2018

## 2018-07-18 ENCOUNTER — Telehealth: Payer: Self-pay | Admitting: Adult Health

## 2018-07-18 NOTE — Telephone Encounter (Signed)
No los °

## 2018-07-24 ENCOUNTER — Inpatient Hospital Stay: Payer: BLUE CROSS/BLUE SHIELD

## 2018-07-24 ENCOUNTER — Other Ambulatory Visit: Payer: Self-pay | Admitting: Oncology

## 2018-07-24 ENCOUNTER — Other Ambulatory Visit: Payer: Self-pay | Admitting: Hematology and Oncology

## 2018-07-24 ENCOUNTER — Inpatient Hospital Stay: Payer: BLUE CROSS/BLUE SHIELD | Attending: Genetic Counselor

## 2018-07-24 VITALS — BP 146/99 | HR 80 | Temp 98.0°F | Resp 18

## 2018-07-24 DIAGNOSIS — C50211 Malignant neoplasm of upper-inner quadrant of right female breast: Secondary | ICD-10-CM | POA: Diagnosis present

## 2018-07-24 DIAGNOSIS — Z5111 Encounter for antineoplastic chemotherapy: Secondary | ICD-10-CM | POA: Diagnosis present

## 2018-07-24 DIAGNOSIS — Z452 Encounter for adjustment and management of vascular access device: Secondary | ICD-10-CM | POA: Diagnosis not present

## 2018-07-24 DIAGNOSIS — Z170 Estrogen receptor positive status [ER+]: Principal | ICD-10-CM

## 2018-07-24 DIAGNOSIS — Z5189 Encounter for other specified aftercare: Secondary | ICD-10-CM | POA: Insufficient documentation

## 2018-07-24 DIAGNOSIS — D701 Agranulocytosis secondary to cancer chemotherapy: Secondary | ICD-10-CM | POA: Insufficient documentation

## 2018-07-24 LAB — CBC WITH DIFFERENTIAL (CANCER CENTER ONLY)
ABS IMMATURE GRANULOCYTES: 0.01 10*3/uL (ref 0.00–0.07)
Basophils Absolute: 0 10*3/uL (ref 0.0–0.1)
Basophils Relative: 1 %
Eosinophils Absolute: 0.1 10*3/uL (ref 0.0–0.5)
Eosinophils Relative: 3 %
HCT: 32.3 % — ABNORMAL LOW (ref 36.0–46.0)
Hemoglobin: 10.4 g/dL — ABNORMAL LOW (ref 12.0–15.0)
Immature Granulocytes: 0 %
Lymphocytes Relative: 22 %
Lymphs Abs: 0.6 10*3/uL — ABNORMAL LOW (ref 0.7–4.0)
MCH: 29.2 pg (ref 26.0–34.0)
MCHC: 32.2 g/dL (ref 30.0–36.0)
MCV: 90.7 fL (ref 80.0–100.0)
Monocytes Absolute: 0.3 10*3/uL (ref 0.1–1.0)
Monocytes Relative: 13 %
Neutro Abs: 1.6 10*3/uL — ABNORMAL LOW (ref 1.7–7.7)
Neutrophils Relative %: 61 %
Platelet Count: 198 10*3/uL (ref 150–400)
RBC: 3.56 MIL/uL — ABNORMAL LOW (ref 3.87–5.11)
RDW: 16.4 % — ABNORMAL HIGH (ref 11.5–15.5)
WBC Count: 2.6 10*3/uL — ABNORMAL LOW (ref 4.0–10.5)
nRBC: 0 % (ref 0.0–0.2)

## 2018-07-24 LAB — CMP (CANCER CENTER ONLY)
ALT: 28 U/L (ref 0–44)
AST: 24 U/L (ref 15–41)
Albumin: 4.1 g/dL (ref 3.5–5.0)
Alkaline Phosphatase: 46 U/L (ref 38–126)
Anion gap: 6 (ref 5–15)
BUN: 6 mg/dL (ref 6–20)
CO2: 26 mmol/L (ref 22–32)
Calcium: 9.6 mg/dL (ref 8.9–10.3)
Chloride: 108 mmol/L (ref 98–111)
Creatinine: 0.67 mg/dL (ref 0.44–1.00)
GFR, Est AFR Am: >60 mL/min (ref >60)
GFR, Estimated: >60 mL/min (ref >60)
Glucose, Bld: 86 mg/dL (ref 70–99)
POTASSIUM: 3.7 mmol/L (ref 3.5–5.1)
Sodium: 140 mmol/L (ref 135–145)
Total Bilirubin: 0.6 mg/dL (ref 0.3–1.2)
Total Protein: 7.1 g/dL (ref 6.5–8.1)

## 2018-07-24 MED ORDER — SODIUM CHLORIDE 0.9 % IV SOLN
20.0000 mg | Freq: Once | INTRAVENOUS | Status: AC
  Administered 2018-07-24: 20 mg via INTRAVENOUS
  Filled 2018-07-24: qty 2

## 2018-07-24 MED ORDER — FAMOTIDINE IN NACL 20-0.9 MG/50ML-% IV SOLN
INTRAVENOUS | Status: AC
  Filled 2018-07-24: qty 50

## 2018-07-24 MED ORDER — SODIUM CHLORIDE 0.9 % IV SOLN
Freq: Once | INTRAVENOUS | Status: AC
  Administered 2018-07-24: 11:00:00 via INTRAVENOUS
  Filled 2018-07-24: qty 250

## 2018-07-24 MED ORDER — FAMOTIDINE IN NACL 20-0.9 MG/50ML-% IV SOLN
20.0000 mg | Freq: Once | INTRAVENOUS | Status: AC
  Administered 2018-07-24: 20 mg via INTRAVENOUS

## 2018-07-24 MED ORDER — DIPHENHYDRAMINE HCL 50 MG/ML IJ SOLN
50.0000 mg | Freq: Once | INTRAMUSCULAR | Status: AC
  Administered 2018-07-24: 50 mg via INTRAVENOUS

## 2018-07-24 MED ORDER — SODIUM CHLORIDE 0.9% FLUSH
10.0000 mL | INTRAVENOUS | Status: DC | PRN
  Administered 2018-07-24: 10 mL
  Filled 2018-07-24: qty 10

## 2018-07-24 MED ORDER — HEPARIN SOD (PORK) LOCK FLUSH 100 UNIT/ML IV SOLN
500.0000 [IU] | Freq: Once | INTRAVENOUS | Status: AC | PRN
  Administered 2018-07-24: 500 [IU]
  Filled 2018-07-24: qty 5

## 2018-07-24 MED ORDER — SODIUM CHLORIDE 0.9 % IV SOLN
80.0000 mg/m2 | Freq: Once | INTRAVENOUS | Status: AC
  Administered 2018-07-24: 138 mg via INTRAVENOUS
  Filled 2018-07-24: qty 23

## 2018-07-24 MED ORDER — DIPHENHYDRAMINE HCL 50 MG/ML IJ SOLN
INTRAMUSCULAR | Status: AC
  Filled 2018-07-24: qty 1

## 2018-07-24 NOTE — Patient Instructions (Signed)
[facility] Discharge Instructions for Patients Receiving Chemotherapy  Today you received the following chemotherapy agents:  Taxol.  To help prevent nausea and vomiting after your treatment, we encourage you to take your nausea medication as directed.   If you develop nausea and vomiting that is not controlled by your nausea medication, call the clinic.   BELOW ARE SYMPTOMS THAT SHOULD BE REPORTED IMMEDIATELY:  *FEVER GREATER THAN 100.5 F  *CHILLS WITH OR WITHOUT FEVER  NAUSEA AND VOMITING THAT IS NOT CONTROLLED WITH YOUR NAUSEA MEDICATION  *UNUSUAL SHORTNESS OF BREATH  *UNUSUAL BRUISING OR BLEEDING  TENDERNESS IN MOUTH AND THROAT WITH OR WITHOUT PRESENCE OF ULCERS  *URINARY PROBLEMS  *BOWEL PROBLEMS  UNUSUAL RASH Items with * indicate a potential emergency and should be followed up as soon as possible.  Feel free to call the clinic should you have any questions or concerns. The clinic phone number is [phone].  Please show the CHEMO ALERT CARD at check-in to the Emergency Department and triage nurse.   

## 2018-07-29 ENCOUNTER — Emergency Department (HOSPITAL_COMMUNITY)
Admission: EM | Admit: 2018-07-29 | Discharge: 2018-07-29 | Disposition: A | Discharge status: Home / Self Care | Payer: BLUE CROSS/BLUE SHIELD | Attending: Emergency Medicine | Admitting: Emergency Medicine

## 2018-07-29 ENCOUNTER — Other Ambulatory Visit: Payer: Self-pay

## 2018-07-29 ENCOUNTER — Encounter (HOSPITAL_COMMUNITY): Payer: Self-pay

## 2018-07-29 ENCOUNTER — Emergency Department (HOSPITAL_COMMUNITY): Payer: BLUE CROSS/BLUE SHIELD

## 2018-07-29 DIAGNOSIS — Z9221 Personal history of antineoplastic chemotherapy: Secondary | ICD-10-CM | POA: Diagnosis not present

## 2018-07-29 DIAGNOSIS — C50919 Malignant neoplasm of unspecified site of unspecified female breast: Secondary | ICD-10-CM | POA: Diagnosis not present

## 2018-07-29 DIAGNOSIS — R509 Fever, unspecified: Secondary | ICD-10-CM | POA: Diagnosis present

## 2018-07-29 DIAGNOSIS — I10 Essential (primary) hypertension: Secondary | ICD-10-CM | POA: Insufficient documentation

## 2018-07-29 DIAGNOSIS — Z79899 Other long term (current) drug therapy: Secondary | ICD-10-CM | POA: Diagnosis not present

## 2018-07-29 DIAGNOSIS — J111 Influenza due to unidentified influenza virus with other respiratory manifestations: Secondary | ICD-10-CM | POA: Diagnosis not present

## 2018-07-29 LAB — COMPREHENSIVE METABOLIC PANEL
ALT: 37 U/L (ref 0–44)
AST: 30 U/L (ref 15–41)
Albumin: 4.4 g/dL (ref 3.5–5.0)
Alkaline Phosphatase: 39 U/L (ref 38–126)
Anion gap: 6 (ref 5–15)
BUN: 7 mg/dL (ref 6–20)
CO2: 25 mmol/L (ref 22–32)
CREATININE: 0.51 mg/dL (ref 0.44–1.00)
Calcium: 9.2 mg/dL (ref 8.9–10.3)
Chloride: 107 mmol/L (ref 98–111)
GFR calc Af Amer: >60 mL/min (ref >60)
GFR calc non Af Amer: >60 mL/min (ref >60)
Glucose, Bld: 93 mg/dL (ref 70–99)
Potassium: 3.8 mmol/L (ref 3.5–5.1)
Sodium: 138 mmol/L (ref 135–145)
Total Bilirubin: 0.8 mg/dL (ref 0.3–1.2)
Total Protein: 7.6 g/dL (ref 6.5–8.1)

## 2018-07-29 LAB — INFLUENZA PANEL BY PCR (TYPE A & B)
INFLAPCR: NEGATIVE
Influenza B By PCR: POSITIVE — AB

## 2018-07-29 LAB — CBC WITH DIFFERENTIAL/PLATELET
Abs Immature Granulocytes: 0.01 10*3/uL (ref 0.00–0.07)
Basophils Absolute: 0 10*3/uL (ref 0.0–0.1)
Basophils Relative: 0 %
EOS ABS: 0 10*3/uL (ref 0.0–0.5)
EOS PCT: 1 %
HCT: 36.5 % (ref 36.0–46.0)
Hemoglobin: 11.5 g/dL — ABNORMAL LOW (ref 12.0–15.0)
Immature Granulocytes: 0 %
Lymphocytes Relative: 8 %
Lymphs Abs: 0.4 10*3/uL — ABNORMAL LOW (ref 0.7–4.0)
MCH: 29.5 pg (ref 26.0–34.0)
MCHC: 31.5 g/dL (ref 30.0–36.0)
MCV: 93.6 fL (ref 80.0–100.0)
Monocytes Absolute: 0.3 10*3/uL (ref 0.1–1.0)
Monocytes Relative: 7 %
Neutro Abs: 3.6 10*3/uL (ref 1.7–7.7)
Neutrophils Relative %: 84 %
Platelets: 180 10*3/uL (ref 150–400)
RBC: 3.9 MIL/uL (ref 3.87–5.11)
RDW: 16.7 % — AB (ref 11.5–15.5)
WBC: 4.3 10*3/uL (ref 4.0–10.5)
nRBC: 0 % (ref 0.0–0.2)

## 2018-07-29 LAB — URINALYSIS, ROUTINE W REFLEX MICROSCOPIC
Bilirubin Urine: NEGATIVE
Glucose, UA: NEGATIVE mg/dL
Hgb urine dipstick: NEGATIVE
Ketones, ur: 5 mg/dL — AB
Nitrite: NEGATIVE
Protein, ur: NEGATIVE mg/dL
Specific Gravity, Urine: 1.028 (ref 1.005–1.030)
pH: 6 (ref 5.0–8.0)

## 2018-07-29 LAB — LACTIC ACID, PLASMA: Lactic Acid, Venous: 0.6 mmol/L (ref 0.5–1.9)

## 2018-07-29 MED ORDER — AMOXICILLIN-POT CLAVULANATE 875-125 MG PO TABS: 1.0000 | ORAL_TABLET | Freq: Two times a day (BID) | ORAL | 0 refills | Status: DC

## 2018-07-29 MED ORDER — ACETAMINOPHEN 500 MG PO TABS
1000.0000 mg | ORAL_TABLET | Freq: Once | ORAL | Status: AC
  Administered 2018-07-29: 1000 mg via ORAL
  Filled 2018-07-29: qty 2

## 2018-07-29 MED ORDER — OSELTAMIVIR PHOSPHATE 75 MG PO CAPS: 75.0000 mg | ORAL_CAPSULE | Freq: Two times a day (BID) | ORAL | 0 refills | Status: DC

## 2018-07-29 MED ORDER — LACTATED RINGERS IV BOLUS
500.0000 mL | Freq: Once | INTRAVENOUS | Status: AC
  Administered 2018-07-29: 500 mL via INTRAVENOUS

## 2018-07-29 MED ORDER — OSELTAMIVIR PHOSPHATE 75 MG PO CAPS
75.0000 mg | ORAL_CAPSULE | Freq: Once | ORAL | Status: AC
  Administered 2018-07-29: 75 mg via ORAL
  Filled 2018-07-29: qty 1

## 2018-07-29 MED ORDER — AMOXICILLIN-POT CLAVULANATE 875-125 MG PO TABS
1.0000 | ORAL_TABLET | Freq: Once | ORAL | Status: AC
  Administered 2018-07-29: 1 via ORAL
  Filled 2018-07-29: qty 1

## 2018-07-29 MED ORDER — HEPARIN SOD (PORK) LOCK FLUSH 100 UNIT/ML IV SOLN
500.0000 [IU] | Freq: Once | INTRAVENOUS | Status: AC
  Administered 2018-07-29: 500 [IU]
  Filled 2018-07-29: qty 5

## 2018-07-29 NOTE — Discharge Instructions (Signed)
1.  Fill your prescription for Tamiflu and Augmentin.  Start both of these medications tonight at bedtime.  You were given doses in the emergency department. 2.  Take ibuprofen and Tylenol for control of fever or chills. 3.  Call your oncologist on Monday to let them know that you have been diagnosed with the flu and need a recheck Monday or Tuesday. 4.  If at any point in time you feel that your condition is worsening, you are having difficulty taking fluids and staying hydrated, you are getting chest pain or shortness of breath, confusion weakness or dizziness, you must return to the emergency department.

## 2018-07-29 NOTE — ED Provider Notes (Addendum)
[facility] DEPT Provider Note   CSN: [id number] Arrival date & time: 07/29/18  0915     History   Chief Complaint Chief Complaint  Patient presents with  . Fever    HPI Miranda Ayers is a 33 y.o. female.  HPI Patient reports that she thinks she developed a little cold yesterday.  She reports she has mild symptoms of nasal congestion and slight cough.  She reports she developed a temperature to 100.4.  Patient reports that she is told because of her breast cancer chemotherapy anytime she has a fever she needs to get evaluated at the emergency department.  Reports she feels fine.  She denies that she has any chest pain or shortness of breath.  Denies abdominal pain nausea or vomiting.  Patient reports he took Tylenol at 7 AM.  She has not had an influenza shot this year. Past Medical History:  Diagnosis Date  . Cancer [facility])    breast cancer  . Family history of colon cancer   . Family history of lung cancer   . Family history of pancreatic cancer   . Hypertension     Patient Active Problem List   Diagnosis Date Noted  . Genetic testing 06/22/2018  . Family history of lung cancer   . Family history of colon cancer   . Family history of pancreatic cancer   . Malignant neoplasm of upper-inner quadrant of right breast in female, estrogen receptor positive ([place]) 04/19/2018    Past Surgical History:  Procedure Laterality Date  . OOPHORECTOMY  2013  . PORTACATH PLACEMENT N/A 05/08/2018   Procedure: INSERTION PORT-A-CATH WITH ULTRASOUND;  Surgeon: [name], MD;  Location: [facility];  Service: General;  Laterality: N/A;     OB History   No obstetric history on file.      Home Medications    Prior to Admission medications   Medication Sig Start Date End Date Taking? Authorizing Provider  hydrochlorothiazide (HYDRODIURIL) 12.5 MG tablet Take 12.5 mg by mouth daily.   Yes [provider]  ibuprofen  (ADVIL,MOTRIN) 800 MG tablet Take 1 tablet (800 mg total) by mouth every 8 (eight) hours as needed. 05/08/18  Yes Cornett, Marcello Moores, MD  lidocaine-prilocaine (EMLA) cream Apply to affected area once 04/25/18  Yes [name], MD  LORazepam (ATIVAN) 0.5 MG tablet Take 1 tablet (0.5 mg total) by mouth at bedtime as needed (Nausea or vomiting). 04/25/18  Yes [name], MD  ondansetron (ZOFRAN) 8 MG tablet Take 1 tablet (8 mg total) by mouth 2 (two) times daily as needed. Start on the third day after chemotherapy. 04/25/18  Yes [name], MD  prochlorperazine (COMPAZINE) 10 MG tablet Take 1 tablet (10 mg total) by mouth every 6 (six) hours as needed (Nausea or vomiting). 04/25/18  Yes [name], MD  omeprazole (PRILOSEC) 40 MG capsule Take 1 capsule (40 mg total) by mouth daily. Patient not taking: Reported on 07/29/2018 05/23/18   [name], NP    Family History Family History  Problem Relation Age of Onset  . Lung cancer Maternal Grandmother   . Lung cancer Maternal Grandfather   . Colon cancer Maternal Grandfather   . Stomach cancer Paternal Grandfather   . Lung cancer Maternal Aunt   . Pancreatic cancer Maternal Aunt   . Lung cancer Maternal Uncle   . Cancer Cousin        childhood cancer in mat first cousin    Social History Social History  Tobacco Use  . Smoking status: Never Smoker  . Smokeless tobacco: Never Used  Substance Use Topics  . Alcohol use: Never    Frequency: Never  . Drug use: Never     Allergies   Patient has no known allergies.   Review of Systems Review of Systems 10 Systems reviewed and are negative for acute change except as noted in the HPI.   Physical Exam Updated Vital Signs BP (!) 147/104   Pulse (!) 105   Temp 98.8 F (37.1 C) (Oral)   Resp 19   Ht 5\' 1"  (1.549 m)   Wt 66.9 kg   SpO2 100%   BMI 27.87 kg/m   Physical Exam Constitutional:      Comments: Patient is alert and nontoxic.  Clinically well in  appearance.  No respiratory distress.  HENT:     Head: Normocephalic and atraumatic.     Comments: Alopecia from chemotherapy otherwise normal without any facial swelling.    Nose: Nose normal.     Mouth/Throat:     Mouth: Mucous membranes are moist.     Pharynx: Oropharynx is clear.  Eyes:     Extraocular Movements: Extraocular movements intact.     Conjunctiva/sclera: Conjunctivae normal.  Neck:     Musculoskeletal: Neck supple.  Cardiovascular:     Rate and Rhythm: Normal rate and regular rhythm.  Pulmonary:     Effort: Pulmonary effort is normal.     Breath sounds: Normal breath sounds.  Abdominal:     General: There is no distension.     Palpations: Abdomen is soft.     Tenderness: There is no abdominal tenderness. There is no guarding.  Musculoskeletal: Normal range of motion.        General: No swelling or tenderness.     Right lower leg: No edema.     Left lower leg: No edema.  Skin:    General: Skin is warm and dry.     Findings: No rash.  Neurological:     General: No focal deficit present.     Mental Status: She is oriented to person, place, and time.     Coordination: Coordination normal.  Psychiatric:        Mood and Affect: Mood normal.      ED Treatments / Results  Labs (all labs ordered are listed, but only abnormal results are displayed) Labs Reviewed  CBC WITH DIFFERENTIAL/PLATELET - Abnormal; Notable for the following components:      Result Value   Hemoglobin 11.5 (*)    RDW 16.7 (*)    Lymphs Abs 0.4 (*)    All other components within normal limits  URINALYSIS, ROUTINE W REFLEX MICROSCOPIC - Abnormal; Notable for the following components:   APPearance HAZY (*)    Ketones, ur 5 (*)    Leukocytes, UA MODERATE (*)    Bacteria, UA RARE (*)    All other components within normal limits  INFLUENZA PANEL BY PCR (TYPE A & B) - Abnormal; Notable for the following components:   Influenza B By PCR POSITIVE (*)    All other components within normal  limits  URINE CULTURE  CULTURE, BLOOD (ROUTINE X 2)  CULTURE, BLOOD (ROUTINE X 2)  RESPIRATORY PANEL BY PCR  COMPREHENSIVE METABOLIC PANEL  LACTIC ACID, PLASMA  LACTIC ACID, PLASMA    EKG None  Radiology Dg Chest 2 View  Result Date: 07/29/2018 CLINICAL DATA:  33 year old female with history of fever. History of breast cancer. EXAM:  CHEST - 2 VIEW COMPARISON:  Chest x-ray 05/08/2018. FINDINGS: Right internal jugular single-lumen porta cath with tip terminating in the distal superior vena cava. Lung volumes are normal. No consolidative airspace disease. No pleural effusions. No pneumothorax. No pulmonary nodule or mass noted. Pulmonary vasculature and the cardiomediastinal silhouette are within normal limits. IMPRESSION: No radiographic evidence of acute cardiopulmonary disease. Electronically Signed   By: Vinnie Langton M.D.   On: 07/29/2018 11:15    Procedures Procedures (including critical care time)  Medications Ordered in ED Medications  oseltamivir (TAMIFLU) capsule 75 mg (has no administration in time range)  acetaminophen (TYLENOL) tablet 1,000 mg (has no administration in time range)  amoxicillin-clavulanate (AUGMENTIN) 875-125 MG per tablet 1 tablet (has no administration in time range)  lactated ringers bolus 500 mL (has no administration in time range)  lactated ringers bolus 500 mL (0 mLs Intravenous Stopped 07/29/18 1329)     Initial Impression / Assessment and Plan / ED Course  I have reviewed the triage vital signs and the nursing notes.  Pertinent labs & imaging results that were available during my care of the patient were reviewed by me and considered in my medical decision making (see chart for details).    Patient presents with mild URI symptoms.  Clinically, the patient reports she feels well.  She does have breast cancer and is currently undergoing chemotherapy.  Patient is not neutropenic.  No respiratory distress.  Patient's urinalysis is somewhat  contaminated but positive for leukocytes.  Will empirically initiate Augmentin until culture result.  Patient made aware of need to follow-up on culture.  Return precautions reviewed.   Have placed consult for oncology but at this time have not had return of page.  Patient and her companion do not wish to wait for response.  They feel comfortable starting Tamiflu and Augmentin with return if there is any worsening in his clinical symptoms.  Aware of the need to call Dr. Payton Mccallum on Monday to let him know of her diagnosis and follow-up on culture and recheck. Final Clinical Impressions(s) / ED Diagnoses   Final diagnoses:  Influenza  Malignant neoplasm of female breast, unspecified estrogen receptor status, unspecified laterality, unspecified site of breast [facility])    ED Discharge Orders    None       Charlesetta Shanks, MD 07/29/18 1337    Charlesetta Shanks, MD 07/29/18 [phone]

## 2018-07-29 NOTE — ED Triage Notes (Signed)
Pt states that she had a fever at home and has had a cold. Pt states she was dx with breast cancer, pt states last chemo treatment 07/24/18. 100.4 fever at home.Pt states she took tylenol at 0700.

## 2018-07-30 ENCOUNTER — Telehealth: Payer: Self-pay

## 2018-07-30 ENCOUNTER — Encounter: Payer: Self-pay | Admitting: Hematology and Oncology

## 2018-07-30 LAB — RESPIRATORY PANEL BY PCR
Adenovirus: NOT DETECTED
Bordetella pertussis: NOT DETECTED
Chlamydophila pneumoniae: NOT DETECTED
Coronavirus 229E: NOT DETECTED
Coronavirus HKU1: NOT DETECTED
Coronavirus NL63: NOT DETECTED
Coronavirus OC43: NOT DETECTED
INFLUENZA B-RVPPCR: DETECTED — AB
Influenza A: NOT DETECTED
Metapneumovirus: NOT DETECTED
Mycoplasma pneumoniae: NOT DETECTED
Parainfluenza Virus 1: NOT DETECTED
Parainfluenza Virus 2: NOT DETECTED
Parainfluenza Virus 3: NOT DETECTED
Parainfluenza Virus 4: NOT DETECTED
Respiratory Syncytial Virus: NOT DETECTED
Rhinovirus / Enterovirus: NOT DETECTED

## 2018-07-30 LAB — URINE CULTURE: Culture: NO GROWTH

## 2018-07-30 NOTE — Telephone Encounter (Signed)
Pt called to report she was diagnosed with influenza yesterday in ED.  Pt scheduled for cycle 4, Taxol 07/31/2018 and is calling to see if this needs to be rescheduled.  Per Dr. Lindi Adie, cancel upcoming infusion - resume with cycle 4 the following week 2/18 - pharmacy notified.    Nurse encouraged fluids, good hand hygiene, and avoiding large crowds.  Pt on tamiflu.  Voiced understanding.

## 2018-07-31 ENCOUNTER — Other Ambulatory Visit: Payer: BLUE CROSS/BLUE SHIELD

## 2018-07-31 ENCOUNTER — Ambulatory Visit: Payer: BLUE CROSS/BLUE SHIELD

## 2018-07-31 ENCOUNTER — Ambulatory Visit: Payer: BLUE CROSS/BLUE SHIELD | Admitting: Hematology and Oncology

## 2018-08-01 ENCOUNTER — Telehealth: Payer: Self-pay | Admitting: Hematology and Oncology

## 2018-08-01 NOTE — Telephone Encounter (Signed)
Scheduled appt per 2/10 sch message - pt to get an updated schedule next visit.

## 2018-08-03 LAB — CULTURE, BLOOD (ROUTINE X 2)
Culture: NO GROWTH
Culture: NO GROWTH
Special Requests: ADEQUATE
Special Requests: ADEQUATE

## 2018-08-07 ENCOUNTER — Inpatient Hospital Stay: Payer: BLUE CROSS/BLUE SHIELD

## 2018-08-07 VITALS — BP 156/108 | HR 91 | Temp 97.9°F | Resp 18

## 2018-08-07 DIAGNOSIS — Z170 Estrogen receptor positive status [ER+]: Principal | ICD-10-CM

## 2018-08-07 DIAGNOSIS — C50211 Malignant neoplasm of upper-inner quadrant of right female breast: Secondary | ICD-10-CM

## 2018-08-07 DIAGNOSIS — Z5111 Encounter for antineoplastic chemotherapy: Secondary | ICD-10-CM | POA: Diagnosis not present

## 2018-08-07 LAB — CMP (CANCER CENTER ONLY)
ALT: 52 U/L — ABNORMAL HIGH (ref 0–44)
AST: 45 U/L — ABNORMAL HIGH (ref 15–41)
Albumin: 4.1 g/dL (ref 3.5–5.0)
Alkaline Phosphatase: 55 U/L (ref 38–126)
Anion gap: 9 (ref 5–15)
BUN: 6 mg/dL (ref 6–20)
CO2: 26 mmol/L (ref 22–32)
Calcium: 9.7 mg/dL (ref 8.9–10.3)
Chloride: 106 mmol/L (ref 98–111)
Creatinine: 0.74 mg/dL (ref 0.44–1.00)
GFR, Est AFR Am: >60 mL/min (ref >60)
GFR, Estimated: >60 mL/min (ref >60)
Glucose, Bld: 83 mg/dL (ref 70–99)
Potassium: 4.1 mmol/L (ref 3.5–5.1)
Sodium: 141 mmol/L (ref 135–145)
Total Bilirubin: 0.7 mg/dL (ref 0.3–1.2)
Total Protein: 7.3 g/dL (ref 6.5–8.1)

## 2018-08-07 LAB — CBC WITH DIFFERENTIAL (CANCER CENTER ONLY)
Abs Immature Granulocytes: 0 10*3/uL (ref 0.00–0.07)
BASOS PCT: 1 %
Basophils Absolute: 0 10*3/uL (ref 0.0–0.1)
Eosinophils Absolute: 0.1 10*3/uL (ref 0.0–0.5)
Eosinophils Relative: 4 %
HCT: 35.6 % — ABNORMAL LOW (ref 36.0–46.0)
Hemoglobin: 11.4 g/dL — ABNORMAL LOW (ref 12.0–15.0)
Immature Granulocytes: 0 %
Lymphocytes Relative: 30 %
Lymphs Abs: 0.6 10*3/uL — ABNORMAL LOW (ref 0.7–4.0)
MCH: 29.8 pg (ref 26.0–34.0)
MCHC: 32 g/dL (ref 30.0–36.0)
MCV: 93.2 fL (ref 80.0–100.0)
Monocytes Absolute: 0.4 10*3/uL (ref 0.1–1.0)
Monocytes Relative: 23 %
Neutro Abs: 0.8 10*3/uL — ABNORMAL LOW (ref 1.7–7.7)
Neutrophils Relative %: 42 %
Platelet Count: 261 10*3/uL (ref 150–400)
RBC: 3.82 MIL/uL — ABNORMAL LOW (ref 3.87–5.11)
RDW: 15.8 % — AB (ref 11.5–15.5)
WBC Count: 1.9 10*3/uL — ABNORMAL LOW (ref 4.0–10.5)
nRBC: 0 % (ref 0.0–0.2)

## 2018-08-07 MED ORDER — SODIUM CHLORIDE 0.9% FLUSH
10.0000 mL | INTRAVENOUS | Status: DC | PRN
  Administered 2018-08-07: 10 mL
  Filled 2018-08-07: qty 10

## 2018-08-07 MED ORDER — HEPARIN SOD (PORK) LOCK FLUSH 100 UNIT/ML IV SOLN
500.0000 [IU] | Freq: Once | INTRAVENOUS | Status: AC | PRN
  Administered 2018-08-07: 500 [IU]
  Filled 2018-08-07: qty 5

## 2018-08-07 MED ORDER — SODIUM CHLORIDE 0.9% FLUSH
10.0000 mL | INTRAVENOUS | Status: DC | PRN
  Filled 2018-08-07: qty 10

## 2018-08-07 NOTE — Patient Instructions (Signed)
Neutropenic Precautions   Neutrophils are made in the spongy center of large bones (bone marrow) and they fight infections. Neutrophils are your body's main defense against bacterial and fungal infections. The fewer neutrophils you have and the longer your body remains without them, the greater your risk of getting a severe infection. What are the causes? This condition can occur if your body uses up or destroys neutrophils faster than your bone marrow can make them. This problem may happen because of:  Bacterial or fungal infection.  Allergic disorders.  Reactions to some medicines.  Autoimmune disease.  An enlarged spleen. This condition can also occur if your bone marrow does not produce enough neutrophils. This problem may be caused by:  Cancer.  Cancer treatments, such as radiation or chemotherapy.  Viral infections.  Medicines, such as phenytoin.  Vitamin B12 deficiency.  Diseases of the bone marrow.  Environmental toxins, such as insecticides. What are the signs or symptoms? This condition does not usually cause symptoms. If symptoms are present, they are usually caused by an underlying infection. Symptoms of an infection may include:  Fever.  Chills.  Swollen glands.  Oral or anal ulcers.  Cough and shortness of breath.  Rash.  Skin infection.  Fatigue. How is this diagnosed? Your health care provider may suspect neutropenia if you have:  A condition that may cause neutropenia.  Symptoms of infection, especially fever.  Frequent and unusual infections. You will have a medical history and physical exam. Tests will also be done, such as:  A complete blood count (CBC).  A procedure to collect a sample of bone marrow for examination (bone marrow biopsy).  A chest X-ray.  A urine culture.  A blood culture. How is this treated? Treatment depends on the underlying cause and severity of your condition. Mild neutropenia may not require treatment.  Treatment may include medicines, such as:  Antibiotic medicine given through an IV tube.  Antiviral medicines.  Antifungal medicines.  A medicine to increase neutrophil production (colony-stimulating factor). You may get this drug through an IV tube or by injection.  Steroids given through an IV tube. If an underlying condition is causing neutropenia, you may need treatment for that condition. If medicines you are taking are causing neutropenia, your health care provider may have you stop taking those medicines. Follow these instructions at home: Medicines   Take over-the-counter and prescription medicines only as told by your health care provider.  Get a seasonal flu shot (influenza vaccine). Lifestyle  Do not eat unpasteurized foods.Do not eat unwashed raw fruits or vegetables.  Avoid exposure to groups of people or children.  Avoid being around people who are sick.  Avoid being around dirt or dust, such as in construction areas or gardens.  Do not provide direct care for pets. Avoid animal droppings. Do not clean litter boxes and bird cages. Hygiene   Bathe daily.  Clean the area between the genitals and the anus (perineal area) after you urinate or have a bowel movement. If you are female, wipe from front to back.  Brush your teeth with a soft toothbrush before and after meals.  Do not use a razor that has a blade. Use an electric razor to remove hair.  Wash your hands often. Make sure others who come in contact with you also wash their hands. If soap and water are not available, use hand sanitizer. General instructions  Do not have sex unless your health care provider has approved.  Take actions to avoid  cuts and burns. For example: ? Be cautious when you use knives. Always cut away from yourself. ? Keep knives in protective sheaths or guards when not in use. ? Use oven mitts when you cook with a hot stove, oven, or grill. ? Stand a safe distance away from open  fires.  Avoid people who received a vaccine in the past 30 days if that vaccine contained a live version of the germ (live vaccine). You should not get a live vaccine. Common live vaccines are varicella, measles, mumps, and rubella.  Do not share food utensils.  Do not use tampons, enemas, or rectal suppositories unless your health care provider has approved.  Keep all appointments as told by your health care provider. This is important. Contact a health care provider if:  You have a fever.  You have chills or you start to shake.  You have: ? A sore throat. ? A warm, red, or tender area on your skin. ? A cough. ? Frequent or painful urination. ? Vaginal discharge or itching.  You develop: ? Sores in your mouth or anus. ? Swollen lymph nodes. ? Red streaks on the skin. ? A rash.  You feel: ? Nauseous or you vomit. ? Very fatigued. ? Short of breath. This information is not intended to replace advice given to you by your health care provider. Make sure you discuss any questions you have with your health care provider. Document Released: 11/26/2001 Document Revised: 01/31/2018 Document Reviewed: 12/17/2014 Elsevier Interactive Patient Education  2019 [employer].

## 2018-08-14 ENCOUNTER — Encounter: Payer: Self-pay | Admitting: Hematology and Oncology

## 2018-08-14 ENCOUNTER — Inpatient Hospital Stay: Payer: BLUE CROSS/BLUE SHIELD

## 2018-08-14 ENCOUNTER — Other Ambulatory Visit: Payer: Self-pay | Admitting: Hematology and Oncology

## 2018-08-14 ENCOUNTER — Inpatient Hospital Stay (HOSPITAL_BASED_OUTPATIENT_CLINIC_OR_DEPARTMENT_OTHER): Payer: BLUE CROSS/BLUE SHIELD | Admitting: Adult Health

## 2018-08-14 ENCOUNTER — Telehealth: Payer: Self-pay | Admitting: Adult Health

## 2018-08-14 ENCOUNTER — Encounter: Payer: Self-pay | Admitting: Adult Health

## 2018-08-14 VITALS — BP 136/88 | HR 93 | Temp 97.6°F | Resp 18 | Ht 61.0 in | Wt 146.3 lb

## 2018-08-14 DIAGNOSIS — Z170 Estrogen receptor positive status [ER+]: Secondary | ICD-10-CM | POA: Diagnosis not present

## 2018-08-14 DIAGNOSIS — D701 Agranulocytosis secondary to cancer chemotherapy: Secondary | ICD-10-CM

## 2018-08-14 DIAGNOSIS — C50211 Malignant neoplasm of upper-inner quadrant of right female breast: Secondary | ICD-10-CM

## 2018-08-14 DIAGNOSIS — Z5111 Encounter for antineoplastic chemotherapy: Secondary | ICD-10-CM | POA: Diagnosis not present

## 2018-08-14 LAB — CBC WITH DIFFERENTIAL (CANCER CENTER ONLY)
Abs Immature Granulocytes: 0 10*3/uL (ref 0.00–0.07)
Basophils Absolute: 0 10*3/uL (ref 0.0–0.1)
Basophils Relative: 1 %
Eosinophils Absolute: 0.1 10*3/uL (ref 0.0–0.5)
Eosinophils Relative: 2 %
HCT: 37.1 % (ref 36.0–46.0)
Hemoglobin: 12.1 g/dL (ref 12.0–15.0)
Immature Granulocytes: 0 %
Lymphocytes Relative: 28 %
Lymphs Abs: 0.7 10*3/uL (ref 0.7–4.0)
MCH: 30.3 pg (ref 26.0–34.0)
MCHC: 32.6 g/dL (ref 30.0–36.0)
MCV: 92.8 fL (ref 80.0–100.0)
Monocytes Absolute: 0.5 10*3/uL (ref 0.1–1.0)
Monocytes Relative: 19 %
Neutro Abs: 1.3 10*3/uL — ABNORMAL LOW (ref 1.7–7.7)
Neutrophils Relative %: 50 %
Platelet Count: 232 10*3/uL (ref 150–400)
RBC: 4 MIL/uL (ref 3.87–5.11)
RDW: 14.4 % (ref 11.5–15.5)
WBC Count: 2.5 10*3/uL — ABNORMAL LOW (ref 4.0–10.5)
nRBC: 0 % (ref 0.0–0.2)

## 2018-08-14 LAB — CMP (CANCER CENTER ONLY)
ALK PHOS: 60 U/L (ref 38–126)
ALT: 25 U/L (ref 0–44)
AST: 21 U/L (ref 15–41)
Albumin: 4.2 g/dL (ref 3.5–5.0)
Anion gap: 9 (ref 5–15)
BUN: 11 mg/dL (ref 6–20)
CO2: 26 mmol/L (ref 22–32)
Calcium: 9.9 mg/dL (ref 8.9–10.3)
Chloride: 107 mmol/L (ref 98–111)
Creatinine: 0.73 mg/dL (ref 0.44–1.00)
GFR, Est AFR Am: >60 mL/min (ref >60)
GFR, Estimated: >60 mL/min (ref >60)
Glucose, Bld: 93 mg/dL (ref 70–99)
Potassium: 3.8 mmol/L (ref 3.5–5.1)
SODIUM: 142 mmol/L (ref 135–145)
Total Bilirubin: 0.7 mg/dL (ref 0.3–1.2)
Total Protein: 7.6 g/dL (ref 6.5–8.1)

## 2018-08-14 MED ORDER — FAMOTIDINE IN NACL 20-0.9 MG/50ML-% IV SOLN: 20.0000 mg | Freq: Once | INTRAVENOUS | Status: DC

## 2018-08-14 MED ORDER — DIPHENHYDRAMINE HCL 50 MG/ML IJ SOLN
INTRAMUSCULAR | Status: AC
  Filled 2018-08-14: qty 1

## 2018-08-14 MED ORDER — SODIUM CHLORIDE 0.9 % IV SOLN
20.0000 mg | Freq: Once | INTRAVENOUS | Status: AC
  Administered 2018-08-14: 20 mg via INTRAVENOUS
  Filled 2018-08-14: qty 20

## 2018-08-14 MED ORDER — SODIUM CHLORIDE 0.9 % IV SOLN
20.0000 mg | Freq: Once | INTRAVENOUS | Status: AC
  Administered 2018-08-14: 20 mg via INTRAVENOUS
  Filled 2018-08-14: qty 2

## 2018-08-14 MED ORDER — HEPARIN SOD (PORK) LOCK FLUSH 100 UNIT/ML IV SOLN
500.0000 [IU] | Freq: Once | INTRAVENOUS | Status: AC | PRN
  Administered 2018-08-14: 500 [IU]
  Filled 2018-08-14: qty 5

## 2018-08-14 MED ORDER — SODIUM CHLORIDE 0.9 % IV SOLN
80.0000 mg/m2 | Freq: Once | INTRAVENOUS | Status: AC
  Administered 2018-08-14: 138 mg via INTRAVENOUS
  Filled 2018-08-14: qty 23

## 2018-08-14 MED ORDER — DIPHENHYDRAMINE HCL 50 MG/ML IJ SOLN
50.0000 mg | Freq: Once | INTRAMUSCULAR | Status: AC
  Administered 2018-08-14: 50 mg via INTRAVENOUS

## 2018-08-14 MED ORDER — SODIUM CHLORIDE 0.9 % IV SOLN
Freq: Once | INTRAVENOUS | Status: AC
  Administered 2018-08-14: 11:00:00 via INTRAVENOUS
  Filled 2018-08-14: qty 250

## 2018-08-14 MED ORDER — SODIUM CHLORIDE 0.9% FLUSH
10.0000 mL | INTRAVENOUS | Status: DC | PRN
  Administered 2018-08-14: 10 mL
  Filled 2018-08-14: qty 10

## 2018-08-14 NOTE — Progress Notes (Signed)
Patient approved for one-time $1000 [employer]. Gave a copy of the approval letter as well as the expense sheet and Outpatient pharmacy information.  She has my card for any additional financial questions or concerns.

## 2018-08-14 NOTE — Telephone Encounter (Signed)
No los °

## 2018-08-14 NOTE — Assessment & Plan Note (Addendum)
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%, PR 0%, HER-2 negative, Ki-67 20%; biopsy of the 4.5 cm mass came back as IDC grade 2-3, ER 50%, PR 5%, HER-2 -2+ ratio 1.6, copy #3.2, Ki-67 20%, T2N1 stage IIb  Recommendation: 1.Neoadjuvant chemotherapy with dose dense Adriamycinand Cytoxan x4 followed by Taxol weekly x12started 05/09/2018 2.followed byBilmastectomy and axillary lymph node dissection 3.Followed by radiation 4.Followed by adjuvant antiestrogen therapy ----------------------------------------------------------- Current treatment: Neoadjuvant dose dense Adriamycin and Cytoxan started 05/11/2018, today cycle 4 Taxol Chemo toxicities: 1.Dysgeusia: she is managing this well.   2. Neutropenia: this has caused a delay in chemotherapy, therefore we will add Granix to the Saturday prior to her Tuesday treatments.  We reviewed risks and benefits of this as we discussed it in detail.    She is doing well otherwise.  No peripheral neuropathy at this point.  Monitoring closely for chemo toxicities Labs were reviewed Return to clinic weekly for Taxol and every 2 weeks for follow-up with me or Dr. Lindi Adie.

## 2018-08-14 NOTE — Progress Notes (Signed)
Okay to treat today with  Neut. 1.3 per Mendel Ryder, NP

## 2018-08-14 NOTE — Progress Notes (Signed)
[facility] Follow up:    Miranda Ayers, [place] [address]   DIAGNOSIS: Cancer Staging Malignant neoplasm of upper-inner quadrant of right breast in female, estrogen receptor positive ([place]) Staging form: Breast, AJCC 8th Edition - Clinical: Stage IIIA (cT2, cN1, cM0, G3, ER+, PR-, HER2-) - Unsigned   SUMMARY OF ONCOLOGIC HISTORY:   Malignant neoplasm of upper-inner quadrant of right breast in female, estrogen receptor positive ([place])   04/17/2018 Initial Diagnosis    Palpable right breast masses UOQ and UIQ with multiple masses 12 to 14 cm, at 1:00 1.6 cm, 1231.8 cm, and o'clock 4.5 cm and at 9:30 position 5 cm: Biopsy revealed IDC with DCIS, grade 3, ER 80%, PR 0%, HER-2 negative, Ki-67 20%; biopsy of the 4.5 cm mass came back as IDC grade 2-3, ER 50%, PR 5%, HER-2 -2+ ratio 1.6, copy #3.2, Ki-67 20%, T2N1 stage IIb    05/09/2018 -  Neo-Adjuvant Chemotherapy    Neoadjuvant chemotherapy with dose dense Adriamycin and Cytoxan x4 followed by Taxol weekly x12    06/19/2018 Genetic Testing    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  The Multi-Gene Panel offered by [facility] includes sequencing and/or deletion duplication testing of the following 84 genes: AIP, ALK, APC, ATM, AXIN2,BAP1,  BARD1, BLM, BMPR1A, BRCA1, BRCA2, BRIP1, CASR, CDC73, CDH1, CDK4, CDKN1B, CDKN1C, CDKN2A (p14ARF), CDKN2A (p16INK4a), CEBPA, CHEK2, CTNNA1, DICER1, DIS3L2, EGFR (c.2369C>T, p.Thr790Met variant only), EPCAM (Deletion/duplication testing only), FH, FLCN, GATA2, GPC3, GREM1 (Promoter region deletion/duplication testing only), HOXB13 (c.251G>A, p.Gly84Glu), HRAS, KIT, MAX, MEN1, MET, MITF (c.952G>A, p.Glu318Lys variant only), MLH1, MSH2, MSH3, MSH6, MUTYH, NBN, NF1, NF2, NTHL1, PALB2, PDGFRA, PHOX2B, PMS2, POLD1, POLE, POT1, PRKAR1A, PTCH1, PTEN, RAD50, RAD51C, RAD51D, RB1, RECQL4, RET, RUNX1, SDHAF2, SDHA (sequence changes  only), SDHB, SDHC, SDHD, SMAD4, SMARCA4, SMARCB1, SMARCE1, STK11, SUFU, TERC, TERT, TMEM127, TP53, TSC1, TSC2, VHL, WRN and WT1.  The report date is 06/19/2018.     CURRENT THERAPY: Taxol week 4  INTERVAL HISTORY: Miranda Ayers 33 y.o. female returns for evaluation prior to receiving week 4 of her weekly neoadjuvant chemotherapy with Taxol.  She has missed the past two weeks, first, because she had the flu, and then second because her [place] was 0.8.  She is feeling much better today, and denies any issues.  She can no longer feel her breast mass in her right breast.  She denies any peripheral neuropathy.     Patient Active Problem List   Diagnosis Date Noted  . Genetic testing 06/22/2018  . Family history of lung cancer   . Family history of colon cancer   . Family history of pancreatic cancer   . Malignant neoplasm of upper-inner quadrant of right breast in female, estrogen receptor positive ([place]) 04/19/2018    has No Known Allergies.  MEDICAL HISTORY: Past Medical History:  Diagnosis Date  . Cancer [facility])    breast cancer  . Family history of colon cancer   . Family history of lung cancer   . Family history of pancreatic cancer   . Hypertension     SURGICAL HISTORY: Past Surgical History:  Procedure Laterality Date  . OOPHORECTOMY  2013  . PORTACATH PLACEMENT N/A 05/08/2018   Procedure: INSERTION PORT-A-CATH WITH ULTRASOUND;  Surgeon: [name], MD;  Location: [place] [facility];  Service: General;  Laterality: N/A;    SOCIAL HISTORY: Social History   Socioeconomic History  .  Marital status: Single    Spouse name: Not on file  . Number of children: Not on file  . Years of education: Not on file  . Highest education level: Not on file  Occupational History  . Not on file  Social Needs  . Financial resource strain: Not on file  . Food insecurity:    Worry: Not on file    Inability: Not on file  . Transportation needs:    Medical: Not on file     Non-medical: Not on file  Tobacco Use  . Smoking status: Never Smoker  . Smokeless tobacco: Never Used  Substance and Sexual Activity  . Alcohol use: Never    Frequency: Never  . Drug use: Never  . Sexual activity: Not on file  Lifestyle  . Physical activity:    Days per week: Not on file    Minutes per session: Not on file  . Stress: Not on file  Relationships  . Social connections:    Talks on phone: Not on file    Gets together: Not on file    Attends religious service: Not on file    Active member of club or organization: Not on file    Attends meetings of clubs or organizations: Not on file    Relationship status: Not on file  . Intimate partner violence:    Fear of current or ex partner: Not on file    Emotionally abused: Not on file    Physically abused: Not on file    Forced sexual activity: Not on file  Other Topics Concern  . Not on file  Social History Narrative  . Not on file    FAMILY HISTORY: Family History  Problem Relation Age of Onset  . Lung cancer Maternal Grandmother   . Lung cancer Maternal Grandfather   . Colon cancer Maternal Grandfather   . Stomach cancer Paternal Grandfather   . Lung cancer Maternal Aunt   . Pancreatic cancer Maternal Aunt   . Lung cancer Maternal Uncle   . Cancer Cousin        childhood cancer in mat first cousin    Review of Systems  Constitutional: Negative for appetite change, chills and fatigue.  HENT:   Negative for hearing loss, lump/mass, sore throat, tinnitus and voice change.   Eyes: Negative for eye problems and icterus.  Respiratory: Negative for chest tightness, cough and shortness of breath.   Cardiovascular: Negative for chest pain, leg swelling and palpitations.  Gastrointestinal: Negative for abdominal distention, abdominal pain, constipation, diarrhea, nausea and vomiting.  Endocrine: Negative for hot flashes.  Genitourinary: Negative for difficulty urinating.   Musculoskeletal: Negative for  arthralgias.  Skin: Negative for itching and rash.  Neurological: Negative for dizziness, extremity weakness, headaches and numbness.  Hematological: Negative for adenopathy. Does not bruise/bleed easily.  Psychiatric/Behavioral: Negative for depression. The patient is not nervous/anxious.       PHYSICAL EXAMINATION  ECOG PERFORMANCE STATUS: 1 - Symptomatic but completely ambulatory  Vitals:   08/14/18 0935  BP: 136/88  Pulse: 93  Resp: 18  Temp: 97.6 F (36.4 C)  SpO2: 100%    Physical Exam Constitutional:      Appearance: Normal appearance.  HENT:     Head: Normocephalic and atraumatic.     Nose: Nose normal.     Mouth/Throat:     Mouth: Mucous membranes are moist.     Pharynx: Oropharynx is clear. No oropharyngeal exudate or posterior oropharyngeal erythema.  Eyes:  General: No scleral icterus.    Pupils: Pupils are equal, round, and reactive to light.  Neck:     Musculoskeletal: Normal range of motion and neck supple.  Cardiovascular:     Rate and Rhythm: Normal rate and regular rhythm.     Pulses: Normal pulses.     Heart sounds: Normal heart sounds.  Pulmonary:     Effort: Pulmonary effort is normal.     Breath sounds: Normal breath sounds.  Abdominal:     General: Abdomen is flat. There is no distension.     Palpations: Abdomen is soft.     Tenderness: There is no abdominal tenderness.  Musculoskeletal: Normal range of motion.        General: No swelling.  Skin:    General: Skin is warm and dry.     Capillary Refill: Capillary refill takes less than 2 seconds.     Findings: No rash.  Neurological:     General: No focal deficit present.     Mental Status: She is alert and oriented to person, place, and time.  Psychiatric:        Mood and Affect: Mood normal.        Behavior: Behavior normal.     LABORATORY DATA:  CBC    Component Value Date/Time   WBC 2.5 (L) 08/14/2018 0908   WBC 4.3 07/29/2018 1136   RBC 4.00 08/14/2018 0908   HGB 12.1  08/14/2018 0908   HCT 37.1 08/14/2018 0908   PLT 232 08/14/2018 0908   MCV 92.8 08/14/2018 0908   MCH 30.3 08/14/2018 0908   MCHC 32.6 08/14/2018 0908   RDW 14.4 08/14/2018 0908   LYMPHSABS 0.7 08/14/2018 0908   MONOABS 0.5 08/14/2018 0908   EOSABS 0.1 08/14/2018 0908   BASOSABS 0.0 08/14/2018 0908    CMP     Component Value Date/Time   NA 142 08/14/2018 0908   K 3.8 08/14/2018 0908   CL 107 08/14/2018 0908   CO2 26 08/14/2018 0908   GLUCOSE 93 08/14/2018 0908   BUN 11 08/14/2018 0908   CREATININE 0.73 08/14/2018 0908   CALCIUM 9.9 08/14/2018 0908   PROT 7.6 08/14/2018 0908   ALBUMIN 4.2 08/14/2018 0908   AST 21 08/14/2018 0908   ALT 25 08/14/2018 0908   ALKPHOS 60 08/14/2018 0908   BILITOT 0.7 08/14/2018 0908   GFRNONAA >60 08/14/2018 0908   GFRAA >60 08/14/2018 0908            ASSESSMENT and THERAPY PLAN:   Malignant neoplasm of upper-inner quadrant of right breast in female, estrogen receptor positive ([place]) 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%, PR 0%, HER-2 negative, Ki-67 20%; biopsy of the 4.5 cm mass came back as IDC grade 2-3, ER 50%, PR 5%, HER-2 -2+ ratio 1.6, copy #3.2, Ki-67 20%, T2N1 stage IIb  Recommendation: 1.Neoadjuvant chemotherapy with dose dense Adriamycinand Cytoxan x4 followed by Taxol weekly x12started 05/09/2018 2.followed byBilmastectomy and axillary lymph node dissection 3.Followed by radiation 4.Followed by adjuvant antiestrogen therapy ----------------------------------------------------------- Current treatment: Neoadjuvant dose dense Adriamycin and Cytoxan started 05/11/2018, today cycle 4 Taxol Chemo toxicities: 1.Dysgeusia: she is managing this well.   2. Neutropenia: this has caused a delay in chemotherapy, therefore we will add Granix to the Saturday  prior to her Tuesday treatments.  We reviewed risks and benefits of this as we discussed it in detail.    She is doing well otherwise.  No peripheral neuropathy at this point.  Monitoring closely for chemo toxicities Labs were reviewed Return to clinic weekly for Taxol and every 2 weeks for follow-up with me or Dr. Lindi Adie.      All questions were answered. The patient knows to call the clinic with any problems, questions or concerns. We can certainly see the patient much sooner if necessary.  A total of (30) minutes of face-to-face time was spent with this patient with greater than 50% of that time in counseling and care-coordination.  This note was electronically signed. Scot Dock, NP 08/14/2018

## 2018-08-14 NOTE — Patient Instructions (Signed)
[facility] Discharge Instructions for Patients Receiving Chemotherapy  Today you received the following chemotherapy agents:  Taxol.  To help prevent nausea and vomiting after your treatment, we encourage you to take your nausea medication as directed.   If you develop nausea and vomiting that is not controlled by your nausea medication, call the clinic.   BELOW ARE SYMPTOMS THAT SHOULD BE REPORTED IMMEDIATELY:  *FEVER GREATER THAN 100.5 F  *CHILLS WITH OR WITHOUT FEVER  NAUSEA AND VOMITING THAT IS NOT CONTROLLED WITH YOUR NAUSEA MEDICATION  *UNUSUAL SHORTNESS OF BREATH  *UNUSUAL BRUISING OR BLEEDING  TENDERNESS IN MOUTH AND THROAT WITH OR WITHOUT PRESENCE OF ULCERS  *URINARY PROBLEMS  *BOWEL PROBLEMS  UNUSUAL RASH Items with * indicate a potential emergency and should be followed up as soon as possible.  Feel free to call the clinic should you have any questions or concerns. The clinic phone number is [phone].  Please show the CHEMO ALERT CARD at check-in to the Emergency Department and triage nurse.   

## 2018-08-16 ENCOUNTER — Telehealth: Payer: Self-pay | Admitting: Hematology and Oncology

## 2018-08-16 NOTE — Telephone Encounter (Signed)
R/s appt per 2/26 sch message - pt aware. Adjusted all other appts to after 8:45 where it was possible

## 2018-08-18 ENCOUNTER — Inpatient Hospital Stay: Payer: BLUE CROSS/BLUE SHIELD

## 2018-08-18 VITALS — BP 131/98 | HR 85 | Temp 98.6°F | Resp 18

## 2018-08-18 DIAGNOSIS — Z5111 Encounter for antineoplastic chemotherapy: Secondary | ICD-10-CM | POA: Diagnosis not present

## 2018-08-18 DIAGNOSIS — Z170 Estrogen receptor positive status [ER+]: Principal | ICD-10-CM

## 2018-08-18 DIAGNOSIS — D702 Other drug-induced agranulocytosis: Secondary | ICD-10-CM

## 2018-08-18 DIAGNOSIS — C50211 Malignant neoplasm of upper-inner quadrant of right female breast: Secondary | ICD-10-CM

## 2018-08-18 MED ORDER — HEPARIN SOD (PORK) LOCK FLUSH 100 UNIT/ML IV SOLN
500.0000 [IU] | Freq: Once | INTRAVENOUS | Status: DC | PRN
  Filled 2018-08-18: qty 5

## 2018-08-18 MED ORDER — SODIUM CHLORIDE 0.9% FLUSH
10.0000 mL | INTRAVENOUS | Status: DC | PRN
  Filled 2018-08-18: qty 10

## 2018-08-18 MED ORDER — TBO-FILGRASTIM 300 MCG/0.5ML ~~LOC~~ SOSY
300.0000 ug | PREFILLED_SYRINGE | SUBCUTANEOUS | Status: DC
  Administered 2018-08-18: 300 ug via SUBCUTANEOUS

## 2018-08-18 MED ORDER — TBO-FILGRASTIM 300 MCG/0.5ML ~~LOC~~ SOSY
PREFILLED_SYRINGE | SUBCUTANEOUS | Status: AC
  Filled 2018-08-18: qty 0.5

## 2018-08-18 NOTE — Patient Instructions (Signed)
Tbo-Filgrastim injection What is this medicine? TBO-FILGRASTIM (T B O fil GRA stim) is a granulocyte colony-stimulating factor that stimulates the growth of neutrophils, a type of white blood cell important in the body's fight against infection. It is used to reduce the incidence of fever and infection in patients with certain types of cancer who are receiving chemotherapy that affects the bone marrow. This medicine may be used for other purposes; ask your health care provider or pharmacist if you have questions. COMMON BRAND NAME(S): Granix What should I tell my health care provider before I take this medicine? They need to know if you have any of these conditions: -bone scan or tests planned -kidney disease -sickle cell anemia -an unusual or allergic reaction to tbo-filgrastim, filgrastim, pegfilgrastim, other medicines, foods, dyes, or preservatives -pregnant or trying to get pregnant -breast-feeding How should I use this medicine? This medicine is for injection under the skin. If you get this medicine at home, you will be taught how to prepare and give this medicine. Refer to the Instructions for Use that come with your medication packaging. Use exactly as directed. Take your medicine at regular intervals. Do not take your medicine more often than directed. It is important that you put your used needles and syringes in a special sharps container. Do not put them in a trash can. If you do not have a sharps container, call your pharmacist or healthcare provider to get one. Talk to your pediatrician regarding the use of this medicine in children. While this drug may be prescribed for children as young as 1 month of age for selected conditions, precautions do apply. Overdosage: If you think you have taken too much of this medicine contact a poison control center or emergency room at once. NOTE: This medicine is only for you. Do not share this medicine with others. What if I miss a dose? It is  important not to miss your dose. Call your doctor or health care professional if you miss a dose. What may interact with this medicine? This medicine may interact with the following medications: -medicines that may cause a release of neutrophils, such as lithium This list may not describe all possible interactions. Give your health care provider a list of all the medicines, herbs, non-prescription drugs, or dietary supplements you use. Also tell them if you smoke, drink alcohol, or use illegal drugs. Some items may interact with your medicine. What should I watch for while using this medicine? You may need blood work done while you are taking this medicine. What side effects may I notice from receiving this medicine? Side effects that you should report to your doctor or health care professional as soon as possible: -allergic reactions like skin rash, itching or hives, swelling of the face, lips, or tongue -back pain -blood in the urine -dark urine -dizziness -fast heartbeat -feeling faint -shortness of breath or breathing problems -signs and symptoms of infection like fever or chills; cough; or sore throat -signs and symptoms of kidney injury like trouble passing urine or change in the amount of urine -stomach or side pain, or pain at the shoulder -sweating -swelling of the legs, ankles, or abdomen -tiredness Side effects that usually do not require medical attention (report to your doctor or health care professional if they continue or are bothersome): -bone pain -diarrhea -headache -muscle pain -vomiting This list may not describe all possible side effects. Call your doctor for medical advice about side effects. You may report side effects to FDA at   1-800-FDA-1088. Where should I keep my medicine? Keep out of the reach of children. Store in a refrigerator between 2 and 8 degrees C (36 and 46 degrees F). Keep in carton to protect from light. Throw away this medicine if it is left out  of the refrigerator for more than 5 consecutive days. Throw away any unused medicine after the expiration date. NOTE: This sheet is a summary. It may not cover all possible information. If you have questions about this medicine, talk to your doctor, pharmacist, or health care provider.  2019 Elsevier/Gold Standard (2017-01-24 16:56:18)  

## 2018-08-20 DIAGNOSIS — D702 Other drug-induced agranulocytosis: Secondary | ICD-10-CM | POA: Insufficient documentation

## 2018-08-20 NOTE — Addendum Note (Signed)
Addended by: Henreitta Leber E on: 08/20/2018 08:06 AM   Modules accepted: Orders

## 2018-08-21 ENCOUNTER — Other Ambulatory Visit: Payer: BLUE CROSS/BLUE SHIELD

## 2018-08-21 ENCOUNTER — Ambulatory Visit: Payer: BLUE CROSS/BLUE SHIELD

## 2018-08-22 ENCOUNTER — Inpatient Hospital Stay: Payer: BLUE CROSS/BLUE SHIELD | Attending: Genetic Counselor

## 2018-08-22 ENCOUNTER — Inpatient Hospital Stay: Payer: BLUE CROSS/BLUE SHIELD

## 2018-08-22 VITALS — BP 130/99 | HR 82 | Temp 98.2°F | Resp 17 | Wt 145.8 lb

## 2018-08-22 DIAGNOSIS — G620 Drug-induced polyneuropathy: Secondary | ICD-10-CM | POA: Diagnosis not present

## 2018-08-22 DIAGNOSIS — D701 Agranulocytosis secondary to cancer chemotherapy: Secondary | ICD-10-CM | POA: Diagnosis not present

## 2018-08-22 DIAGNOSIS — Z170 Estrogen receptor positive status [ER+]: Secondary | ICD-10-CM

## 2018-08-22 DIAGNOSIS — R432 Parageusia: Secondary | ICD-10-CM | POA: Diagnosis not present

## 2018-08-22 DIAGNOSIS — C50211 Malignant neoplasm of upper-inner quadrant of right female breast: Secondary | ICD-10-CM | POA: Insufficient documentation

## 2018-08-22 DIAGNOSIS — Z5111 Encounter for antineoplastic chemotherapy: Secondary | ICD-10-CM | POA: Diagnosis present

## 2018-08-22 DIAGNOSIS — D702 Other drug-induced agranulocytosis: Secondary | ICD-10-CM

## 2018-08-22 LAB — COMPREHENSIVE METABOLIC PANEL
ALT: 24 U/L (ref 0–44)
AST: 19 U/L (ref 15–41)
Albumin: 4.3 g/dL (ref 3.5–5.0)
Alkaline Phosphatase: 59 U/L (ref 38–126)
Anion gap: 5 (ref 5–15)
BUN: 10 mg/dL (ref 6–20)
CO2: 27 mmol/L (ref 22–32)
Calcium: 9.3 mg/dL (ref 8.9–10.3)
Chloride: 110 mmol/L (ref 98–111)
Creatinine, Ser: 0.62 mg/dL (ref 0.44–1.00)
GFR calc Af Amer: >60 mL/min (ref >60)
GFR calc non Af Amer: >60 mL/min (ref >60)
Glucose, Bld: 89 mg/dL (ref 70–99)
Potassium: 3.8 mmol/L (ref 3.5–5.1)
Sodium: 142 mmol/L (ref 135–145)
TOTAL PROTEIN: 7.3 g/dL (ref 6.5–8.1)
Total Bilirubin: 0.4 mg/dL (ref 0.3–1.2)

## 2018-08-22 LAB — CBC WITH DIFFERENTIAL (CANCER CENTER ONLY)
Abs Immature Granulocytes: 0.03 10*3/uL (ref 0.00–0.07)
Basophils Absolute: 0 10*3/uL (ref 0.0–0.1)
Basophils Relative: 1 %
Eosinophils Absolute: 0.1 10*3/uL (ref 0.0–0.5)
Eosinophils Relative: 3 %
HCT: 35.6 % — ABNORMAL LOW (ref 36.0–46.0)
HEMOGLOBIN: 11.2 g/dL — AB (ref 12.0–15.0)
Immature Granulocytes: 1 %
LYMPHS ABS: 0.8 10*3/uL (ref 0.7–4.0)
Lymphocytes Relative: 25 %
MCH: 29.7 pg (ref 26.0–34.0)
MCHC: 31.5 g/dL (ref 30.0–36.0)
MCV: 94.4 fL (ref 80.0–100.0)
Monocytes Absolute: 0.6 10*3/uL (ref 0.1–1.0)
Monocytes Relative: 18 %
NEUTROS PCT: 52 %
Neutro Abs: 1.7 10*3/uL (ref 1.7–7.7)
Platelet Count: 195 10*3/uL (ref 150–400)
RBC: 3.77 MIL/uL — ABNORMAL LOW (ref 3.87–5.11)
RDW: 13.5 % (ref 11.5–15.5)
WBC Count: 3.1 10*3/uL — ABNORMAL LOW (ref 4.0–10.5)
nRBC: 0 % (ref 0.0–0.2)

## 2018-08-22 MED ORDER — SODIUM CHLORIDE 0.9 % IV SOLN
20.0000 mg | Freq: Once | INTRAVENOUS | Status: AC
  Administered 2018-08-22: 20 mg via INTRAVENOUS
  Filled 2018-08-22: qty 2

## 2018-08-22 MED ORDER — HEPARIN SOD (PORK) LOCK FLUSH 100 UNIT/ML IV SOLN
500.0000 [IU] | Freq: Once | INTRAVENOUS | Status: AC | PRN
  Administered 2018-08-22: 500 [IU]
  Filled 2018-08-22: qty 5

## 2018-08-22 MED ORDER — SODIUM CHLORIDE 0.9 % IV SOLN
80.0000 mg/m2 | Freq: Once | INTRAVENOUS | Status: AC
  Administered 2018-08-22: 138 mg via INTRAVENOUS
  Filled 2018-08-22: qty 23

## 2018-08-22 MED ORDER — DIPHENHYDRAMINE HCL 50 MG/ML IJ SOLN
50.0000 mg | Freq: Once | INTRAMUSCULAR | Status: AC
  Administered 2018-08-22: 50 mg via INTRAVENOUS

## 2018-08-22 MED ORDER — FAMOTIDINE IN NACL 20-0.9 MG/50ML-% IV SOLN: 20.0000 mg | Freq: Once | INTRAVENOUS | Status: DC

## 2018-08-22 MED ORDER — DIPHENHYDRAMINE HCL 50 MG/ML IJ SOLN
INTRAMUSCULAR | Status: AC
  Filled 2018-08-22: qty 1

## 2018-08-22 MED ORDER — SODIUM CHLORIDE 0.9% FLUSH
10.0000 mL | INTRAVENOUS | Status: DC | PRN
  Administered 2018-08-22: 10 mL
  Filled 2018-08-22: qty 10

## 2018-08-22 MED ORDER — SODIUM CHLORIDE 0.9 % IV SOLN
20.0000 mg | Freq: Once | INTRAVENOUS | Status: AC
  Administered 2018-08-22: 20 mg via INTRAVENOUS
  Filled 2018-08-22: qty 20

## 2018-08-22 MED ORDER — SODIUM CHLORIDE 0.9 % IV SOLN
Freq: Once | INTRAVENOUS | Status: AC
  Administered 2018-08-22: 15:00:00 via INTRAVENOUS
  Filled 2018-08-22: qty 250

## 2018-08-22 NOTE — Patient Instructions (Signed)
[facility] Discharge Instructions for Patients Receiving Chemotherapy  Today you received the following chemotherapy agents: Paclitaxel (Taxol).  To help prevent nausea and vomiting after your treatment, we encourage you to take your nausea medication as directed.    If you develop nausea and vomiting that is not controlled by your nausea medication, call the clinic.   BELOW ARE SYMPTOMS THAT SHOULD BE REPORTED IMMEDIATELY:  *FEVER GREATER THAN 100.5 F  *CHILLS WITH OR WITHOUT FEVER  NAUSEA AND VOMITING THAT IS NOT CONTROLLED WITH YOUR NAUSEA MEDICATION  *UNUSUAL SHORTNESS OF BREATH  *UNUSUAL BRUISING OR BLEEDING  TENDERNESS IN MOUTH AND THROAT WITH OR WITHOUT PRESENCE OF ULCERS  *URINARY PROBLEMS  *BOWEL PROBLEMS  UNUSUAL RASH Items with * indicate a potential emergency and should be followed up as soon as possible.  Feel free to call the clinic should you have any questions or concerns. The clinic phone number is [phone].  Please show the CHEMO ALERT CARD at check-in to the Emergency Department and triage nurse.   

## 2018-08-23 NOTE — Progress Notes (Signed)
Patient Care Team: [name], DO as PCP - General (Family Medicine) [name], MD as Consulting Physician (General Surgery) [name], MD as Consulting Physician (Hematology and Oncology) Kyung Rudd, MD as Consulting Physician (Radiation Oncology)  DIAGNOSIS:    ICD-10-CM   1. Malignant neoplasm of upper-inner quadrant of right breast in female, estrogen receptor positive ([place]) C50.211    Z17.0     SUMMARY OF ONCOLOGIC HISTORY:   Malignant neoplasm of upper-inner quadrant of right breast in female, estrogen receptor positive ([place])   04/17/2018 Initial Diagnosis    Palpable right breast masses UOQ and UIQ with multiple masses 12 to 14 cm, at 1:00 1.6 cm, 1231.8 cm, and o'clock 4.5 cm and at 9:30 position 5 cm: Biopsy revealed IDC with DCIS, grade 3, ER 80%, PR 0%, HER-2 negative, Ki-67 20%; biopsy of the 4.5 cm mass came back as IDC grade 2-3, ER 50%, PR 5%, HER-2 -2+ ratio 1.6, copy #3.2, Ki-67 20%, T2N1 stage IIb    05/09/2018 -  Neo-Adjuvant Chemotherapy    Neoadjuvant chemotherapy with dose dense Adriamycin and Cytoxan x4 followed by Taxol weekly x12    06/19/2018 Genetic Testing    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  The Multi-Gene Panel offered by [facility] includes sequencing and/or deletion duplication testing of the following 84 genes: AIP, ALK, APC, ATM, AXIN2,BAP1,  BARD1, BLM, BMPR1A, BRCA1, BRCA2, BRIP1, CASR, CDC73, CDH1, CDK4, CDKN1B, CDKN1C, CDKN2A (p14ARF), CDKN2A (p16INK4a), CEBPA, CHEK2, CTNNA1, DICER1, DIS3L2, EGFR (c.2369C>T, p.Thr790Met variant only), EPCAM (Deletion/duplication testing only), FH, FLCN, GATA2, GPC3, GREM1 (Promoter region deletion/duplication testing only), HOXB13 (c.251G>A, p.Gly84Glu), HRAS, KIT, MAX, MEN1, MET, MITF (c.952G>A, p.Glu318Lys variant only), MLH1, MSH2, MSH3, MSH6, MUTYH, NBN, NF1, NF2, NTHL1, PALB2, PDGFRA, PHOX2B, PMS2, POLD1, POLE, POT1, PRKAR1A, PTCH1, PTEN, RAD50,  RAD51C, RAD51D, RB1, RECQL4, RET, RUNX1, SDHAF2, SDHA (sequence changes only), SDHB, SDHC, SDHD, SMAD4, SMARCA4, SMARCB1, SMARCE1, STK11, SUFU, TERC, TERT, TMEM127, TP53, TSC1, TSC2, VHL, WRN and WT1.  The report date is 06/19/2018.     CHIEF COMPLIANT: Cycle 6 Taxol  INTERVAL HISTORY: Miranda Ayers is a 33 y.o. with above-mentioned history of right breast cancer on neoadjuvant chemotherapy with weekly Taxol. She missed 2 weeks of treatment due to having the flu and then low ANC. She presents to the clinic today with her sisters. She is tolerating treatment well and denies any nausea or vomiting but notes a loss of taste and appetite. She denies any tingling or numbness in her fingers or toes, diarrhea, bone pain, or constipation. Her labs from today show: WBC 3.7, Hg 11.4, platelets 175, and ANC 2.6. She will meet with Dr. Brantley Stage to discuss reconstruction as she is planning on bilateral mastectomy.   REVIEW OF SYSTEMS:   Constitutional: Denies fevers, chills or abnormal weight loss (+) loss of taste (+) loss of appetite Eyes: Denies blurriness of vision Ears, nose, mouth, throat, and face: Denies mucositis or sore throat Respiratory: Denies cough, dyspnea or wheezes Cardiovascular: Denies palpitation, chest discomfort Gastrointestinal: Denies nausea, heartburn or change in bowel habits Skin: Denies abnormal skin rashes Lymphatics: Denies new lymphadenopathy or easy bruising Neurological: Denies numbness, tingling or new weaknesses Behavioral/Psych: Mood is stable, no new changes  Extremities: No lower extremity edema Breast: denies any pain or lumps or nodules in either breasts All other systems were reviewed with the patient and are negative.  I have reviewed the past medical history, past surgical history, social history  and family history with the patient and they are unchanged from previous note.  ALLERGIES:  has No Known Allergies.  MEDICATIONS:  Current Outpatient Medications    Medication Sig Dispense Refill  . hydrochlorothiazide (HYDRODIURIL) 12.5 MG tablet Take 12.5 mg by mouth daily.    Marland Kitchen ibuprofen (ADVIL,MOTRIN) 800 MG tablet Take 1 tablet (800 mg total) by mouth every 8 (eight) hours as needed. 30 tablet 0  . lidocaine-prilocaine (EMLA) cream Apply to affected area once 30 g 3  . LORazepam (ATIVAN) 0.5 MG tablet Take 1 tablet (0.5 mg total) by mouth at bedtime as needed (Nausea or vomiting). 30 tablet 0  . omeprazole (PRILOSEC) 40 MG capsule Take 1 capsule (40 mg total) by mouth daily. 30 capsule 0  . ondansetron (ZOFRAN) 8 MG tablet Take 1 tablet (8 mg total) by mouth 2 (two) times daily as needed. Start on the third day after chemotherapy. 30 tablet 1  . prochlorperazine (COMPAZINE) 10 MG tablet Take 1 tablet (10 mg total) by mouth every 6 (six) hours as needed (Nausea or vomiting). 30 tablet 1   No current facility-administered medications for this visit.    Facility-Administered Medications Ordered in Other Visits  Medication Dose Route Frequency Provider Last Rate Last Dose  . sodium chloride flush (NS) 0.9 % injection 10 mL  10 mL Intracatheter PRN [name], MD   10 mL at 08/07/18 1001  . sodium chloride flush (NS) 0.9 % injection 10 mL  10 mL Intracatheter PRN [name], MD   10 mL at 08/28/18 0844    PHYSICAL EXAMINATION: ECOG PERFORMANCE STATUS: 1 - Symptomatic but completely ambulatory  Vitals:   08/28/18 0902  BP: 140/89  Pulse: 98  Resp: 18  Temp: 98.2 F (36.8 C)  SpO2: 100%   Filed Weights   08/28/18 0902  Weight: 146 lb 4.8 oz (66.4 kg)    GENERAL: alert, no distress and comfortable SKIN: skin color, texture, turgor are normal, no rashes or significant lesions EYES: normal, Conjunctiva are pink and non-injected, sclera clear OROPHARYNX: no exudate, no erythema and lips, buccal mucosa, and tongue normal  NECK: supple, thyroid normal size, non-tender, without nodularity LYMPH: no palpable lymphadenopathy in the cervical,  axillary or inguinal LUNGS: clear to auscultation and percussion with normal breathing effort HEART: regular rate & rhythm and no murmurs and no lower extremity edema ABDOMEN: abdomen soft, non-tender and normal bowel sounds MUSCULOSKELETAL: no cyanosis of digits and no clubbing  NEURO: alert & oriented x 3 with fluent speech, no focal motor/sensory deficits EXTREMITIES: No lower extremity edema  LABORATORY DATA:  I have reviewed the data as listed CMP Latest Ref Rng & Units 08/22/2018 08/14/2018 08/07/2018  Glucose 70 - 99 mg/dL 89 93 83  BUN 6 - 20 mg/dL 10 11 6   Creatinine 0.44 - 1.00 mg/dL 0.62 0.73 0.74  Sodium 135 - 145 mmol/L 142 142 141  Potassium 3.5 - 5.1 mmol/L 3.8 3.8 4.1  Chloride 98 - 111 mmol/L 110 107 106  CO2 22 - 32 mmol/L 27 26 26   Calcium 8.9 - 10.3 mg/dL 9.3 9.9 9.7  Total Protein 6.5 - 8.1 g/dL 7.3 7.6 7.3  Total Bilirubin 0.3 - 1.2 mg/dL 0.4 0.7 0.7  Alkaline Phos 38 - 126 U/L 59 60 55  AST 15 - 41 U/L 19 21 45(H)  ALT 0 - 44 U/L 24 25 52(H)    Lab Results  Component Value Date   WBC 3.7 (L) 08/28/2018   HGB 11.4 (  L) 08/28/2018   HCT 35.7 (L) 08/28/2018   MCV 94.9 08/28/2018   PLT 175 08/28/2018   NEUTROABS 2.6 08/28/2018    ASSESSMENT & PLAN:  Malignant neoplasm of upper-inner quadrant of right breast in female, estrogen receptor positive ([place]) 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%, PR 0%, HER-2 negative, Ki-67 20%; biopsy of the 4.5 cm mass came back as IDC grade 2-3, ER 50%, PR 5%, HER-2 -2+ ratio 1.6, copy #3.2, Ki-67 20%, T2N1 stage IIb  Recommendation: 1.Neoadjuvant chemotherapy with dose dense Adriamycinand Cytoxan x4 followed by Taxol weekly x12started 05/09/2018 2.followed byBilmastectomy and axillary lymph node dissection 3.Followed by radiation 4.Followed by adjuvant  antiestrogen therapy ----------------------------------------------------------- Current treatment: Neoadjuvant dose dense Adriamycin and Cytoxan started11/22/2019, today cycle 6 Taxol Chemo toxicities: 1.Dysgeusia: she is managing this well.   2. Neutropenia: this has caused a delay in chemotherapy, therefore we added Granix to the Saturday prior to her Tuesday treatments.    She is doing well otherwise.  No peripheral neuropathy at this point. I will discuss with Dr. Brantley Stage if plastic surgery referral needs to be made. Monitoring closely for chemo toxicities Labs were reviewed Return to clinic weekly for Taxol and every 2 weeks for follow-up with me    No orders of the defined types were placed in this encounter.  The patient has a good understanding of the overall plan. she agrees with it. she will call with any problems that may develop before the next visit here.  [name], MD 08/28/2018  Julious Oka Dorshimer am acting as scribe for Dr. [name].  I have reviewed the above documentation for accuracy and completeness, and I agree with the above.

## 2018-08-24 ENCOUNTER — Other Ambulatory Visit: Payer: Self-pay | Admitting: Adult Health

## 2018-08-24 ENCOUNTER — Other Ambulatory Visit: Payer: Self-pay | Admitting: *Deleted

## 2018-08-25 ENCOUNTER — Inpatient Hospital Stay: Payer: BLUE CROSS/BLUE SHIELD

## 2018-08-25 VITALS — BP 141/100 | HR 80 | Temp 97.9°F | Resp 16

## 2018-08-25 DIAGNOSIS — D702 Other drug-induced agranulocytosis: Secondary | ICD-10-CM

## 2018-08-25 DIAGNOSIS — C50211 Malignant neoplasm of upper-inner quadrant of right female breast: Secondary | ICD-10-CM

## 2018-08-25 DIAGNOSIS — Z170 Estrogen receptor positive status [ER+]: Secondary | ICD-10-CM

## 2018-08-25 DIAGNOSIS — Z5111 Encounter for antineoplastic chemotherapy: Secondary | ICD-10-CM | POA: Diagnosis not present

## 2018-08-25 MED ORDER — TBO-FILGRASTIM 300 MCG/0.5ML ~~LOC~~ SOSY
300.0000 ug | PREFILLED_SYRINGE | Freq: Once | SUBCUTANEOUS | Status: AC
  Administered 2018-08-25: 300 ug via SUBCUTANEOUS

## 2018-08-25 MED ORDER — TBO-FILGRASTIM 300 MCG/0.5ML ~~LOC~~ SOSY
PREFILLED_SYRINGE | SUBCUTANEOUS | Status: AC
  Filled 2018-08-25: qty 0.5

## 2018-08-25 NOTE — Patient Instructions (Signed)
Tbo-Filgrastim injection What is this medicine? TBO-FILGRASTIM (T B O fil GRA stim) is a granulocyte colony-stimulating factor that stimulates the growth of neutrophils, a type of white blood cell important in the body's fight against infection. It is used to reduce the incidence of fever and infection in patients with certain types of cancer who are receiving chemotherapy that affects the bone marrow. This medicine may be used for other purposes; ask your health care provider or pharmacist if you have questions. COMMON BRAND NAME(S): Granix What should I tell my health care provider before I take this medicine? They need to know if you have any of these conditions: -bone scan or tests planned -kidney disease -sickle cell anemia -an unusual or allergic reaction to tbo-filgrastim, filgrastim, pegfilgrastim, other medicines, foods, dyes, or preservatives -pregnant or trying to get pregnant -breast-feeding How should I use this medicine? This medicine is for injection under the skin. If you get this medicine at home, you will be taught how to prepare and give this medicine. Refer to the Instructions for Use that come with your medication packaging. Use exactly as directed. Take your medicine at regular intervals. Do not take your medicine more often than directed. It is important that you put your used needles and syringes in a special sharps container. Do not put them in a trash can. If you do not have a sharps container, call your pharmacist or healthcare provider to get one. Talk to your pediatrician regarding the use of this medicine in children. While this drug may be prescribed for children as young as 1 month of age for selected conditions, precautions do apply. Overdosage: If you think you have taken too much of this medicine contact a poison control center or emergency room at once. NOTE: This medicine is only for you. Do not share this medicine with others. What if I miss a dose? It is  important not to miss your dose. Call your doctor or health care professional if you miss a dose. What may interact with this medicine? This medicine may interact with the following medications: -medicines that may cause a release of neutrophils, such as lithium This list may not describe all possible interactions. Give your health care provider a list of all the medicines, herbs, non-prescription drugs, or dietary supplements you use. Also tell them if you smoke, drink alcohol, or use illegal drugs. Some items may interact with your medicine. What should I watch for while using this medicine? You may need blood work done while you are taking this medicine. What side effects may I notice from receiving this medicine? Side effects that you should report to your doctor or health care professional as soon as possible: -allergic reactions like skin rash, itching or hives, swelling of the face, lips, or tongue -back pain -blood in the urine -dark urine -dizziness -fast heartbeat -feeling faint -shortness of breath or breathing problems -signs and symptoms of infection like fever or chills; cough; or sore throat -signs and symptoms of kidney injury like trouble passing urine or change in the amount of urine -stomach or side pain, or pain at the shoulder -sweating -swelling of the legs, ankles, or abdomen -tiredness Side effects that usually do not require medical attention (report to your doctor or health care professional if they continue or are bothersome): -bone pain -diarrhea -headache -muscle pain -vomiting This list may not describe all possible side effects. Call your doctor for medical advice about side effects. You may report side effects to FDA at   1-800-FDA-1088. Where should I keep my medicine? Keep out of the reach of children. Store in a refrigerator between 2 and 8 degrees C (36 and 46 degrees F). Keep in carton to protect from light. Throw away this medicine if it is left out  of the refrigerator for more than 5 consecutive days. Throw away any unused medicine after the expiration date. NOTE: This sheet is a summary. It may not cover all possible information. If you have questions about this medicine, talk to your doctor, pharmacist, or health care provider.  2019 Elsevier/Gold Standard (2017-01-24 16:56:18)  

## 2018-08-28 ENCOUNTER — Inpatient Hospital Stay: Payer: BLUE CROSS/BLUE SHIELD

## 2018-08-28 ENCOUNTER — Inpatient Hospital Stay (HOSPITAL_BASED_OUTPATIENT_CLINIC_OR_DEPARTMENT_OTHER): Payer: BLUE CROSS/BLUE SHIELD | Admitting: Hematology and Oncology

## 2018-08-28 DIAGNOSIS — Z170 Estrogen receptor positive status [ER+]: Principal | ICD-10-CM

## 2018-08-28 DIAGNOSIS — R432 Parageusia: Secondary | ICD-10-CM

## 2018-08-28 DIAGNOSIS — C50211 Malignant neoplasm of upper-inner quadrant of right female breast: Secondary | ICD-10-CM

## 2018-08-28 DIAGNOSIS — D702 Other drug-induced agranulocytosis: Secondary | ICD-10-CM

## 2018-08-28 DIAGNOSIS — Z5111 Encounter for antineoplastic chemotherapy: Secondary | ICD-10-CM | POA: Diagnosis not present

## 2018-08-28 DIAGNOSIS — D701 Agranulocytosis secondary to cancer chemotherapy: Secondary | ICD-10-CM | POA: Diagnosis not present

## 2018-08-28 LAB — CMP (CANCER CENTER ONLY)
ALBUMIN: 4 g/dL (ref 3.5–5.0)
ALT: 18 U/L (ref 0–44)
AST: 15 U/L (ref 15–41)
Alkaline Phosphatase: 61 U/L (ref 38–126)
Anion gap: 10 (ref 5–15)
BUN: 9 mg/dL (ref 6–20)
CO2: 25 mmol/L (ref 22–32)
Calcium: 9.5 mg/dL (ref 8.9–10.3)
Chloride: 108 mmol/L (ref 98–111)
Creatinine: 0.7 mg/dL (ref 0.44–1.00)
GFR, Est AFR Am: >60 mL/min (ref >60)
GFR, Estimated: >60 mL/min (ref >60)
Glucose, Bld: 96 mg/dL (ref 70–99)
Potassium: 3.8 mmol/L (ref 3.5–5.1)
Sodium: 143 mmol/L (ref 135–145)
Total Bilirubin: 0.5 mg/dL (ref 0.3–1.2)
Total Protein: 7.2 g/dL (ref 6.5–8.1)

## 2018-08-28 LAB — CBC WITH DIFFERENTIAL (CANCER CENTER ONLY)
Abs Immature Granulocytes: 0.02 10*3/uL (ref 0.00–0.07)
BASOS ABS: 0 10*3/uL (ref 0.0–0.1)
Basophils Relative: 0 %
Eosinophils Absolute: 0.1 10*3/uL (ref 0.0–0.5)
Eosinophils Relative: 1 %
HCT: 35.7 % — ABNORMAL LOW (ref 36.0–46.0)
Hemoglobin: 11.4 g/dL — ABNORMAL LOW (ref 12.0–15.0)
IMMATURE GRANULOCYTES: 1 %
Lymphocytes Relative: 21 %
Lymphs Abs: 0.8 10*3/uL (ref 0.7–4.0)
MCH: 30.3 pg (ref 26.0–34.0)
MCHC: 31.9 g/dL (ref 30.0–36.0)
MCV: 94.9 fL (ref 80.0–100.0)
Monocytes Absolute: 0.2 10*3/uL (ref 0.1–1.0)
Monocytes Relative: 7 %
Neutro Abs: 2.6 10*3/uL (ref 1.7–7.7)
Neutrophils Relative %: 70 %
Platelet Count: 175 10*3/uL (ref 150–400)
RBC: 3.76 MIL/uL — ABNORMAL LOW (ref 3.87–5.11)
RDW: 13.5 % (ref 11.5–15.5)
WBC Count: 3.7 10*3/uL — ABNORMAL LOW (ref 4.0–10.5)
nRBC: 0 % (ref 0.0–0.2)

## 2018-08-28 MED ORDER — FAMOTIDINE IN NACL 20-0.9 MG/50ML-% IV SOLN: 20.0000 mg | Freq: Once | INTRAVENOUS | Status: DC

## 2018-08-28 MED ORDER — DIPHENHYDRAMINE HCL 50 MG/ML IJ SOLN
50.0000 mg | Freq: Once | INTRAMUSCULAR | Status: AC
  Administered 2018-08-28: 50 mg via INTRAVENOUS

## 2018-08-28 MED ORDER — SODIUM CHLORIDE 0.9 % IV SOLN
Freq: Once | INTRAVENOUS | Status: AC
  Administered 2018-08-28: 10:00:00 via INTRAVENOUS
  Filled 2018-08-28: qty 250

## 2018-08-28 MED ORDER — DIPHENHYDRAMINE HCL 50 MG/ML IJ SOLN
INTRAMUSCULAR | Status: AC
  Filled 2018-08-28: qty 1

## 2018-08-28 MED ORDER — SODIUM CHLORIDE 0.9% FLUSH
10.0000 mL | INTRAVENOUS | Status: DC | PRN
  Administered 2018-08-28: 10 mL
  Filled 2018-08-28: qty 10

## 2018-08-28 MED ORDER — SODIUM CHLORIDE 0.9 % IV SOLN
20.0000 mg | Freq: Once | INTRAVENOUS | Status: AC
  Administered 2018-08-28: 20 mg via INTRAVENOUS
  Filled 2018-08-28: qty 2

## 2018-08-28 MED ORDER — HEPARIN SOD (PORK) LOCK FLUSH 100 UNIT/ML IV SOLN
500.0000 [IU] | Freq: Once | INTRAVENOUS | Status: AC | PRN
  Administered 2018-08-28: 500 [IU]
  Filled 2018-08-28: qty 5

## 2018-08-28 MED ORDER — SODIUM CHLORIDE 0.9 % IV SOLN
80.0000 mg/m2 | Freq: Once | INTRAVENOUS | Status: AC
  Administered 2018-08-28: 138 mg via INTRAVENOUS
  Filled 2018-08-28: qty 23

## 2018-08-28 NOTE — Assessment & Plan Note (Signed)
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%, PR 0%, HER-2 negative, Ki-67 20%; biopsy of the 4.5 cm mass came back as IDC grade 2-3, ER 50%, PR 5%, HER-2 -2+ ratio 1.6, copy #3.2, Ki-67 20%, T2N1 stage IIb  Recommendation: 1.Neoadjuvant chemotherapy with dose dense Adriamycinand Cytoxan x4 followed by Taxol weekly x12started 05/09/2018 2.followed byBilmastectomy and axillary lymph node dissection 3.Followed by radiation 4.Followed by adjuvant antiestrogen therapy ----------------------------------------------------------- Current treatment: Neoadjuvant dose dense Adriamycin and Cytoxan started11/22/2019, today cycle 6 Taxol Chemo toxicities: 1.Dysgeusia: she is managing this well.   2. Neutropenia: this has caused a delay in chemotherapy, therefore we added Granix to the Saturday prior to her Tuesday treatments.    She is doing well otherwise.  No peripheral neuropathy at this point.  Monitoring closely for chemo toxicities Labs were reviewed Return to clinic weekly for Taxol and every 2 weeks for follow-up with me

## 2018-08-28 NOTE — Patient Instructions (Signed)
[place] Discharge Instructions for Patients Receiving Chemotherapy  Today you received the following chemotherapy agents: Taxol  To help prevent nausea and vomiting after your treatment, we encourage you to take your nausea medications as directed.   If you develop nausea and vomiting that is not controlled by your nausea medication, call the clinic.   BELOW ARE SYMPTOMS THAT SHOULD BE REPORTED IMMEDIATELY:  *FEVER GREATER THAN 100.5 F  *CHILLS WITH OR WITHOUT FEVER  NAUSEA AND VOMITING THAT IS NOT CONTROLLED WITH YOUR NAUSEA MEDICATION  *UNUSUAL SHORTNESS OF BREATH  *UNUSUAL BRUISING OR BLEEDING  TENDERNESS IN MOUTH AND THROAT WITH OR WITHOUT PRESENCE OF ULCERS  *URINARY PROBLEMS  *BOWEL PROBLEMS  UNUSUAL RASH Items with * indicate a potential emergency and should be followed up as soon as possible.  Feel free to call the clinic should you have any questions or concerns. The clinic phone number is [phone].  Please show the [place] at check-in to the Emergency Department and triage nurse.

## 2018-09-01 ENCOUNTER — Inpatient Hospital Stay: Payer: BLUE CROSS/BLUE SHIELD

## 2018-09-01 ENCOUNTER — Other Ambulatory Visit: Payer: Self-pay

## 2018-09-01 VITALS — BP 129/91 | HR 97 | Temp 97.7°F | Resp 18

## 2018-09-01 DIAGNOSIS — D702 Other drug-induced agranulocytosis: Secondary | ICD-10-CM

## 2018-09-01 DIAGNOSIS — C50211 Malignant neoplasm of upper-inner quadrant of right female breast: Secondary | ICD-10-CM

## 2018-09-01 DIAGNOSIS — Z5111 Encounter for antineoplastic chemotherapy: Secondary | ICD-10-CM | POA: Diagnosis not present

## 2018-09-01 DIAGNOSIS — Z170 Estrogen receptor positive status [ER+]: Secondary | ICD-10-CM

## 2018-09-01 MED ORDER — TBO-FILGRASTIM 300 MCG/0.5ML ~~LOC~~ SOSY
PREFILLED_SYRINGE | SUBCUTANEOUS | Status: AC
  Filled 2018-09-01: qty 0.5

## 2018-09-01 MED ORDER — TBO-FILGRASTIM 300 MCG/0.5ML ~~LOC~~ SOSY
300.0000 ug | PREFILLED_SYRINGE | Freq: Once | SUBCUTANEOUS | Status: AC
  Administered 2018-09-01: 300 ug via SUBCUTANEOUS

## 2018-09-01 NOTE — Patient Instructions (Signed)
Tbo-Filgrastim injection What is this medicine? TBO-FILGRASTIM (T B O fil GRA stim) is a granulocyte colony-stimulating factor that stimulates the growth of neutrophils, a type of white blood cell important in the body's fight against infection. It is used to reduce the incidence of fever and infection in patients with certain types of cancer who are receiving chemotherapy that affects the bone marrow. This medicine may be used for other purposes; ask your health care provider or pharmacist if you have questions. COMMON BRAND NAME(S): Granix What should I tell my health care provider before I take this medicine? They need to know if you have any of these conditions: -bone scan or tests planned -kidney disease -sickle cell anemia -an unusual or allergic reaction to tbo-filgrastim, filgrastim, pegfilgrastim, other medicines, foods, dyes, or preservatives -pregnant or trying to get pregnant -breast-feeding How should I use this medicine? This medicine is for injection under the skin. If you get this medicine at home, you will be taught how to prepare and give this medicine. Refer to the Instructions for Use that come with your medication packaging. Use exactly as directed. Take your medicine at regular intervals. Do not take your medicine more often than directed. It is important that you put your used needles and syringes in a special sharps container. Do not put them in a trash can. If you do not have a sharps container, call your pharmacist or healthcare provider to get one. Talk to your pediatrician regarding the use of this medicine in children. While this drug may be prescribed for children as young as 1 month of age for selected conditions, precautions do apply. Overdosage: If you think you have taken too much of this medicine contact a poison control center or emergency room at once. NOTE: This medicine is only for you. Do not share this medicine with others. What if I miss a dose? It is  important not to miss your dose. Call your doctor or health care professional if you miss a dose. What may interact with this medicine? This medicine may interact with the following medications: -medicines that may cause a release of neutrophils, such as lithium This list may not describe all possible interactions. Give your health care provider a list of all the medicines, herbs, non-prescription drugs, or dietary supplements you use. Also tell them if you smoke, drink alcohol, or use illegal drugs. Some items may interact with your medicine. What should I watch for while using this medicine? You may need blood work done while you are taking this medicine. What side effects may I notice from receiving this medicine? Side effects that you should report to your doctor or health care professional as soon as possible: -allergic reactions like skin rash, itching or hives, swelling of the face, lips, or tongue -back pain -blood in the urine -dark urine -dizziness -fast heartbeat -feeling faint -shortness of breath or breathing problems -signs and symptoms of infection like fever or chills; cough; or sore throat -signs and symptoms of kidney injury like trouble passing urine or change in the amount of urine -stomach or side pain, or pain at the shoulder -sweating -swelling of the legs, ankles, or abdomen -tiredness Side effects that usually do not require medical attention (report to your doctor or health care professional if they continue or are bothersome): -bone pain -diarrhea -headache -muscle pain -vomiting This list may not describe all possible side effects. Call your doctor for medical advice about side effects. You may report side effects to FDA at   1-800-FDA-1088. Where should I keep my medicine? Keep out of the reach of children. Store in a refrigerator between 2 and 8 degrees C (36 and 46 degrees F). Keep in carton to protect from light. Throw away this medicine if it is left out  of the refrigerator for more than 5 consecutive days. Throw away any unused medicine after the expiration date. NOTE: This sheet is a summary. It may not cover all possible information. If you have questions about this medicine, talk to your doctor, pharmacist, or health care provider.  2019 Elsevier/Gold Standard (2017-01-24 16:56:18)  

## 2018-09-04 ENCOUNTER — Inpatient Hospital Stay: Payer: BLUE CROSS/BLUE SHIELD

## 2018-09-04 ENCOUNTER — Other Ambulatory Visit: Payer: BLUE CROSS/BLUE SHIELD

## 2018-09-04 ENCOUNTER — Other Ambulatory Visit: Payer: Self-pay

## 2018-09-04 ENCOUNTER — Ambulatory Visit: Payer: BLUE CROSS/BLUE SHIELD

## 2018-09-04 VITALS — BP 136/92 | HR 98 | Temp 99.0°F | Resp 18

## 2018-09-04 DIAGNOSIS — Z170 Estrogen receptor positive status [ER+]: Principal | ICD-10-CM

## 2018-09-04 DIAGNOSIS — D702 Other drug-induced agranulocytosis: Secondary | ICD-10-CM

## 2018-09-04 DIAGNOSIS — Z5111 Encounter for antineoplastic chemotherapy: Secondary | ICD-10-CM | POA: Diagnosis not present

## 2018-09-04 DIAGNOSIS — C50211 Malignant neoplasm of upper-inner quadrant of right female breast: Secondary | ICD-10-CM

## 2018-09-04 LAB — CMP (CANCER CENTER ONLY)
ALBUMIN: 4 g/dL (ref 3.5–5.0)
ALT: 34 U/L (ref 0–44)
AST: 23 U/L (ref 15–41)
Alkaline Phosphatase: 59 U/L (ref 38–126)
Anion gap: 9 (ref 5–15)
BUN: 10 mg/dL (ref 6–20)
CO2: 25 mmol/L (ref 22–32)
Calcium: 9.6 mg/dL (ref 8.9–10.3)
Chloride: 108 mmol/L (ref 98–111)
Creatinine: 0.7 mg/dL (ref 0.44–1.00)
GFR, Est AFR Am: >60 mL/min (ref >60)
GFR, Estimated: >60 mL/min (ref >60)
GLUCOSE: 93 mg/dL (ref 70–99)
POTASSIUM: 3.9 mmol/L (ref 3.5–5.1)
Sodium: 142 mmol/L (ref 135–145)
Total Bilirubin: 0.3 mg/dL (ref 0.3–1.2)
Total Protein: 7.2 g/dL (ref 6.5–8.1)

## 2018-09-04 LAB — CBC WITH DIFFERENTIAL (CANCER CENTER ONLY)
ABS IMMATURE GRANULOCYTES: 0.27 10*3/uL — AB (ref 0.00–0.07)
Basophils Absolute: 0 10*3/uL (ref 0.0–0.1)
Basophils Relative: 1 %
Eosinophils Absolute: 0.1 10*3/uL (ref 0.0–0.5)
Eosinophils Relative: 1 %
HCT: 34.1 % — ABNORMAL LOW (ref 36.0–46.0)
HEMOGLOBIN: 11.1 g/dL — AB (ref 12.0–15.0)
Immature Granulocytes: 5 %
LYMPHS ABS: 0.8 10*3/uL (ref 0.7–4.0)
Lymphocytes Relative: 17 %
MCH: 31.1 pg (ref 26.0–34.0)
MCHC: 32.6 g/dL (ref 30.0–36.0)
MCV: 95.5 fL (ref 80.0–100.0)
Monocytes Absolute: 0.5 10*3/uL (ref 0.1–1.0)
Monocytes Relative: 9 %
Neutro Abs: 3.4 10*3/uL (ref 1.7–7.7)
Neutrophils Relative %: 67 %
Platelet Count: 233 10*3/uL (ref 150–400)
RBC: 3.57 MIL/uL — ABNORMAL LOW (ref 3.87–5.11)
RDW: 13.4 % (ref 11.5–15.5)
WBC Count: 5.1 10*3/uL (ref 4.0–10.5)
nRBC: 0 % (ref 0.0–0.2)

## 2018-09-04 MED ORDER — SODIUM CHLORIDE 0.9 % IV SOLN
20.0000 mg | Freq: Once | INTRAVENOUS | Status: AC
  Administered 2018-09-04: 20 mg via INTRAVENOUS
  Filled 2018-09-04: qty 2

## 2018-09-04 MED ORDER — HEPARIN SOD (PORK) LOCK FLUSH 100 UNIT/ML IV SOLN
500.0000 [IU] | Freq: Once | INTRAVENOUS | Status: AC | PRN
  Administered 2018-09-04: 500 [IU]
  Filled 2018-09-04: qty 5

## 2018-09-04 MED ORDER — SODIUM CHLORIDE 0.9 % IV SOLN
20.0000 mg | Freq: Once | INTRAVENOUS | Status: AC
  Administered 2018-09-04: 20 mg via INTRAVENOUS
  Filled 2018-09-04: qty 20

## 2018-09-04 MED ORDER — DIPHENHYDRAMINE HCL 50 MG/ML IJ SOLN
INTRAMUSCULAR | Status: AC
  Filled 2018-09-04: qty 1

## 2018-09-04 MED ORDER — FAMOTIDINE IN NACL 20-0.9 MG/50ML-% IV SOLN: 20.0000 mg | Freq: Once | INTRAVENOUS | Status: DC

## 2018-09-04 MED ORDER — SODIUM CHLORIDE 0.9% FLUSH
10.0000 mL | INTRAVENOUS | Status: DC | PRN
  Administered 2018-09-04: 10 mL
  Filled 2018-09-04: qty 10

## 2018-09-04 MED ORDER — SODIUM CHLORIDE 0.9 % IV SOLN
Freq: Once | INTRAVENOUS | Status: AC
  Administered 2018-09-04: 13:00:00 via INTRAVENOUS
  Filled 2018-09-04: qty 250

## 2018-09-04 MED ORDER — SODIUM CHLORIDE 0.9 % IV SOLN
80.0000 mg/m2 | Freq: Once | INTRAVENOUS | Status: AC
  Administered 2018-09-04: 138 mg via INTRAVENOUS
  Filled 2018-09-04: qty 23

## 2018-09-04 MED ORDER — DIPHENHYDRAMINE HCL 50 MG/ML IJ SOLN
50.0000 mg | Freq: Once | INTRAMUSCULAR | Status: AC
  Administered 2018-09-04: 50 mg via INTRAVENOUS

## 2018-09-04 NOTE — Patient Instructions (Signed)
[facility] Discharge Instructions for Patients Receiving Chemotherapy  Today you received the following chemotherapy agents:  Taxol.  To help prevent nausea and vomiting after your treatment, we encourage you to take your nausea medication as directed.   If you develop nausea and vomiting that is not controlled by your nausea medication, call the clinic.   BELOW ARE SYMPTOMS THAT SHOULD BE REPORTED IMMEDIATELY:  *FEVER GREATER THAN 100.5 F  *CHILLS WITH OR WITHOUT FEVER  NAUSEA AND VOMITING THAT IS NOT CONTROLLED WITH YOUR NAUSEA MEDICATION  *UNUSUAL SHORTNESS OF BREATH  *UNUSUAL BRUISING OR BLEEDING  TENDERNESS IN MOUTH AND THROAT WITH OR WITHOUT PRESENCE OF ULCERS  *URINARY PROBLEMS  *BOWEL PROBLEMS  UNUSUAL RASH Items with * indicate a potential emergency and should be followed up as soon as possible.  Feel free to call the clinic should you have any questions or concerns. The clinic phone number is [phone].  Please show the CHEMO ALERT CARD at check-in to the Emergency Department and triage nurse.   

## 2018-09-06 NOTE — Progress Notes (Signed)
Patient Care Team: [name], DO as PCP - General (Family Medicine) [name], MD as Consulting Physician (General Surgery) [name], MD as Consulting Physician (Hematology and Oncology) Kyung Rudd, MD as Consulting Physician (Radiation Oncology)  DIAGNOSIS:    ICD-10-CM   1. Malignant neoplasm of upper-inner quadrant of right breast in female, estrogen receptor positive ([place]) C50.211    Z17.0     SUMMARY OF ONCOLOGIC HISTORY:   Malignant neoplasm of upper-inner quadrant of right breast in female, estrogen receptor positive ([place])   04/17/2018 Initial Diagnosis    Palpable right breast masses UOQ and UIQ with multiple masses 12 to 14 cm, at 1:00 1.6 cm, 1231.8 cm, and o'clock 4.5 cm and at 9:30 position 5 cm: Biopsy revealed IDC with DCIS, grade 3, ER 80%, PR 0%, HER-2 negative, Ki-67 20%; biopsy of the 4.5 cm mass came back as IDC grade 2-3, ER 50%, PR 5%, HER-2 -2+ ratio 1.6, copy #3.2, Ki-67 20%, T2N1 stage IIb    05/09/2018 -  Neo-Adjuvant Chemotherapy    Neoadjuvant chemotherapy with dose dense Adriamycin and Cytoxan x4 followed by Taxol weekly x12    06/19/2018 Genetic Testing    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  The Multi-Gene Panel offered by [facility] includes sequencing and/or deletion duplication testing of the following 84 genes: AIP, ALK, APC, ATM, AXIN2,BAP1,  BARD1, BLM, BMPR1A, BRCA1, BRCA2, BRIP1, CASR, CDC73, CDH1, CDK4, CDKN1B, CDKN1C, CDKN2A (p14ARF), CDKN2A (p16INK4a), CEBPA, CHEK2, CTNNA1, DICER1, DIS3L2, EGFR (c.2369C>T, p.Thr790Met variant only), EPCAM (Deletion/duplication testing only), FH, FLCN, GATA2, GPC3, GREM1 (Promoter region deletion/duplication testing only), HOXB13 (c.251G>A, p.Gly84Glu), HRAS, KIT, MAX, MEN1, MET, MITF (c.952G>A, p.Glu318Lys variant only), MLH1, MSH2, MSH3, MSH6, MUTYH, NBN, NF1, NF2, NTHL1, PALB2, PDGFRA, PHOX2B, PMS2, POLD1, POLE, POT1, PRKAR1A, PTCH1, PTEN, RAD50,  RAD51C, RAD51D, RB1, RECQL4, RET, RUNX1, SDHAF2, SDHA (sequence changes only), SDHB, SDHC, SDHD, SMAD4, SMARCA4, SMARCB1, SMARCE1, STK11, SUFU, TERC, TERT, TMEM127, TP53, TSC1, TSC2, VHL, WRN and WT1.  The report date is 06/19/2018.     CHIEF COMPLIANT: Cycle 8 Taxol  INTERVAL HISTORY: Miranda Ayers is a 33 y.o. with above-mentioned history of right breast cancer on neoadjuvant chemotherapy with weekly Taxol. She presents to the clinic alone today and tolerated her last treatment well but notes tingling in the tips of her fingers the comes and goes. She notes she has been breaking out on her face more than normal for which she has used facial cleanser and rubbing alcohol. Her labs from today show: WBC 6.0, Hg 11.3, platelets 226.   REVIEW OF SYSTEMS:   Constitutional: Denies fevers, chills or abnormal weight loss Eyes: Denies blurriness of vision Ears, nose, mouth, throat, and face: Denies mucositis or sore throat Respiratory: Denies cough, dyspnea or wheezes Cardiovascular: Denies palpitation, chest discomfort Gastrointestinal: Denies nausea, heartburn or change in bowel habits Skin: (+) facial acne Lymphatics: Denies new lymphadenopathy or easy bruising Neurological: Denies numbness or new weaknesses (+) occasional tingling in fingertips Behavioral/Psych: Mood is stable, no new changes  Extremities: No lower extremity edema Breast: denies any pain or lumps or nodules in either breasts All other systems were reviewed with the patient and are negative.  I have reviewed the past medical history, past surgical history, social history and family history with the patient and they are unchanged from previous note.  ALLERGIES:  has No Known Allergies.  MEDICATIONS:  Current Outpatient Medications  Medication Sig Dispense Refill  . hydrochlorothiazide (  HYDRODIURIL) 12.5 MG tablet Take 12.5 mg by mouth daily.    Marland Kitchen ibuprofen (ADVIL,MOTRIN) 800 MG tablet Take 1 tablet (800 mg total) by mouth  every 8 (eight) hours as needed. 30 tablet 0  . lidocaine-prilocaine (EMLA) cream Apply to affected area once 30 g 3  . LORazepam (ATIVAN) 0.5 MG tablet Take 1 tablet (0.5 mg total) by mouth at bedtime as needed (Nausea or vomiting). 30 tablet 0  . omeprazole (PRILOSEC) 40 MG capsule Take 1 capsule (40 mg total) by mouth daily. 30 capsule 0  . ondansetron (ZOFRAN) 8 MG tablet Take 1 tablet (8 mg total) by mouth 2 (two) times daily as needed. Start on the third day after chemotherapy. 30 tablet 1  . prochlorperazine (COMPAZINE) 10 MG tablet Take 1 tablet (10 mg total) by mouth every 6 (six) hours as needed (Nausea or vomiting). 30 tablet 1   No current facility-administered medications for this visit.    Facility-Administered Medications Ordered in Other Visits  Medication Dose Route Frequency Provider Last Rate Last Dose  . sodium chloride flush (NS) 0.9 % injection 10 mL  10 mL Intracatheter PRN [name], MD   10 mL at 08/07/18 1001    PHYSICAL EXAMINATION: ECOG PERFORMANCE STATUS: 1 - Symptomatic but completely ambulatory  Vitals:   09/11/18 1037  BP: (!) 143/93  Pulse: (!) 104  Resp: 18  Temp: 98 F (36.7 C)  SpO2: 100%   Filed Weights   09/11/18 1037  Weight: 148 lb (67.1 kg)    GENERAL: alert, no distress and comfortable SKIN: skin color, texture, turgor are normal, no rashes or significant lesions EYES: normal, Conjunctiva are pink and non-injected, sclera clear OROPHARYNX: no exudate, no erythema and lips, buccal mucosa, and tongue normal  NECK: supple, thyroid normal size, non-tender, without nodularity LYMPH: no palpable lymphadenopathy in the cervical, axillary or inguinal LUNGS: clear to auscultation and percussion with normal breathing effort HEART: regular rate & rhythm and no murmurs and no lower extremity edema ABDOMEN: abdomen soft, non-tender and normal bowel sounds MUSCULOSKELETAL: no cyanosis of digits and no clubbing  NEURO: alert & oriented x 3 with  fluent speech, no focal motor/sensory deficits EXTREMITIES: No lower extremity edema  LABORATORY DATA:  I have reviewed the data as listed CMP Latest Ref Rng & Units 09/11/2018 09/04/2018 08/28/2018  Glucose 70 - 99 mg/dL 98 93 96  BUN 6 - 20 mg/dL 7 10 9   Creatinine 0.44 - 1.00 mg/dL 0.74 0.70 0.70  Sodium 135 - 145 mmol/L 144 142 143  Potassium 3.5 - 5.1 mmol/L 3.7 3.9 3.8  Chloride 98 - 111 mmol/L 109 108 108  CO2 22 - 32 mmol/L 25 25 25   Calcium 8.9 - 10.3 mg/dL 9.2 9.6 9.5  Total Protein 6.5 - 8.1 g/dL 7.2 7.2 7.2  Total Bilirubin 0.3 - 1.2 mg/dL 0.3 0.3 0.5  Alkaline Phos 38 - 126 U/L 61 59 61  AST 15 - 41 U/L 24 23 15   ALT 0 - 44 U/L 30 34 18    Lab Results  Component Value Date   WBC 6.0 09/11/2018   HGB 11.3 (L) 09/11/2018   HCT 34.7 (L) 09/11/2018   MCV 95.1 09/11/2018   PLT 226 09/11/2018   NEUTROABS PENDING 09/11/2018    ASSESSMENT & PLAN:  Malignant neoplasm of upper-inner quadrant of right breast in female, estrogen receptor positive ([place]) 04/17/2018:Palpable right breast masses UOQ and UIQ with multiple masses 12 to 14 cm, at 1:00 1.6 cm,  1231.8 cm, and o'clock 4.5 cm and at 9:30 position 5 cm: Biopsy revealed IDC with DCIS, grade 3, ER 80%, PR 0%, HER-2 negative, Ki-67 20%; biopsy of the 4.5 cm mass came back as IDC grade 2-3, ER 50%, PR 5%, HER-2 -2+ ratio 1.6, copy #3.2, Ki-67 20%, T2N1 stage IIb  Recommendation: 1.Neoadjuvant chemotherapy with dose dense Adriamycinand Cytoxan x4 followed by Taxol weekly x12started 05/09/2018 2.followed byBilmastectomy and axillary lymph node dissection 3.Followed by radiation 4.Followed by adjuvant antiestrogen therapy ----------------------------------------------------------- Current treatment: Neoadjuvant dose dense Adriamycin and Cytoxan started11/22/2019, today cycle8Taxol  Chemo toxicities: 1.Dysgeusia: she is managing this well. 2. Neutropenia: we added Granix to the Saturday prior to her Tuesday  treatments.  3.  Mild neuropathy: Intermittent.  We will continue to watch and monitor.   She is doing well otherwise. No peripheral neuropathy at this point. Monitoring closely for chemo toxicities Labs were reviewed Return to clinic weekly for Taxol and every 2 weeks for follow-up with me When she returns back in 2 weeks we will set her up for the subsequent MRI and surgical consults.  No orders of the defined types were placed in this encounter.  The patient has a good understanding of the overall plan. she agrees with it. she will call with any problems that may develop before the next visit here.  [name], MD 09/11/2018  Julious Oka Dorshimer am acting as scribe for Dr. [name].  I have reviewed the above documentation for accuracy and completeness, and I agree with the above.

## 2018-09-08 ENCOUNTER — Other Ambulatory Visit: Payer: Self-pay

## 2018-09-08 ENCOUNTER — Inpatient Hospital Stay: Payer: BLUE CROSS/BLUE SHIELD

## 2018-09-08 VITALS — BP 129/90 | HR 89 | Temp 98.4°F | Resp 18

## 2018-09-08 DIAGNOSIS — D702 Other drug-induced agranulocytosis: Secondary | ICD-10-CM

## 2018-09-08 DIAGNOSIS — Z170 Estrogen receptor positive status [ER+]: Secondary | ICD-10-CM

## 2018-09-08 DIAGNOSIS — Z5111 Encounter for antineoplastic chemotherapy: Secondary | ICD-10-CM | POA: Diagnosis not present

## 2018-09-08 DIAGNOSIS — C50211 Malignant neoplasm of upper-inner quadrant of right female breast: Secondary | ICD-10-CM

## 2018-09-08 MED ORDER — TBO-FILGRASTIM 480 MCG/0.8ML ~~LOC~~ SOSY
PREFILLED_SYRINGE | SUBCUTANEOUS | Status: AC
  Filled 2018-09-08: qty 0.8

## 2018-09-08 MED ORDER — TBO-FILGRASTIM 300 MCG/0.5ML ~~LOC~~ SOSY
300.0000 ug | PREFILLED_SYRINGE | Freq: Once | SUBCUTANEOUS | Status: AC
  Administered 2018-09-08: 300 ug via SUBCUTANEOUS

## 2018-09-08 NOTE — Patient Instructions (Signed)
Tbo-Filgrastim injection What is this medicine? TBO-FILGRASTIM (T B O fil GRA stim) is a granulocyte colony-stimulating factor that stimulates the growth of neutrophils, a type of white blood cell important in the body's fight against infection. It is used to reduce the incidence of fever and infection in patients with certain types of cancer who are receiving chemotherapy that affects the bone marrow. This medicine may be used for other purposes; ask your health care provider or pharmacist if you have questions. COMMON BRAND NAME(S): Granix What should I tell my health care provider before I take this medicine? They need to know if you have any of these conditions: -bone scan or tests planned -kidney disease -sickle cell anemia -an unusual or allergic reaction to tbo-filgrastim, filgrastim, pegfilgrastim, other medicines, foods, dyes, or preservatives -pregnant or trying to get pregnant -breast-feeding How should I use this medicine? This medicine is for injection under the skin. If you get this medicine at home, you will be taught how to prepare and give this medicine. Refer to the Instructions for Use that come with your medication packaging. Use exactly as directed. Take your medicine at regular intervals. Do not take your medicine more often than directed. It is important that you put your used needles and syringes in a special sharps container. Do not put them in a trash can. If you do not have a sharps container, call your pharmacist or healthcare provider to get one. Talk to your pediatrician regarding the use of this medicine in children. While this drug may be prescribed for children as young as 1 month of age for selected conditions, precautions do apply. Overdosage: If you think you have taken too much of this medicine contact a poison control center or emergency room at once. NOTE: This medicine is only for you. Do not share this medicine with others. What if I miss a dose? It is  important not to miss your dose. Call your doctor or health care professional if you miss a dose. What may interact with this medicine? This medicine may interact with the following medications: -medicines that may cause a release of neutrophils, such as lithium This list may not describe all possible interactions. Give your health care provider a list of all the medicines, herbs, non-prescription drugs, or dietary supplements you use. Also tell them if you smoke, drink alcohol, or use illegal drugs. Some items may interact with your medicine. What should I watch for while using this medicine? You may need blood work done while you are taking this medicine. What side effects may I notice from receiving this medicine? Side effects that you should report to your doctor or health care professional as soon as possible: -allergic reactions like skin rash, itching or hives, swelling of the face, lips, or tongue -back pain -blood in the urine -dark urine -dizziness -fast heartbeat -feeling faint -shortness of breath or breathing problems -signs and symptoms of infection like fever or chills; cough; or sore throat -signs and symptoms of kidney injury like trouble passing urine or change in the amount of urine -stomach or side pain, or pain at the shoulder -sweating -swelling of the legs, ankles, or abdomen -tiredness Side effects that usually do not require medical attention (report to your doctor or health care professional if they continue or are bothersome): -bone pain -diarrhea -headache -muscle pain -vomiting This list may not describe all possible side effects. Call your doctor for medical advice about side effects. You may report side effects to FDA at   1-800-FDA-1088. Where should I keep my medicine? Keep out of the reach of children. Store in a refrigerator between 2 and 8 degrees C (36 and 46 degrees F). Keep in carton to protect from light. Throw away this medicine if it is left out  of the refrigerator for more than 5 consecutive days. Throw away any unused medicine after the expiration date. NOTE: This sheet is a summary. It may not cover all possible information. If you have questions about this medicine, talk to your doctor, pharmacist, or health care provider.  2019 Elsevier/Gold Standard (2017-01-24 16:56:18)  

## 2018-09-11 ENCOUNTER — Inpatient Hospital Stay: Payer: BLUE CROSS/BLUE SHIELD

## 2018-09-11 ENCOUNTER — Other Ambulatory Visit: Payer: Self-pay

## 2018-09-11 ENCOUNTER — Other Ambulatory Visit: Payer: Self-pay | Admitting: Hematology and Oncology

## 2018-09-11 ENCOUNTER — Inpatient Hospital Stay (HOSPITAL_BASED_OUTPATIENT_CLINIC_OR_DEPARTMENT_OTHER): Payer: BLUE CROSS/BLUE SHIELD | Admitting: Hematology and Oncology

## 2018-09-11 VITALS — HR 98

## 2018-09-11 DIAGNOSIS — D701 Agranulocytosis secondary to cancer chemotherapy: Secondary | ICD-10-CM

## 2018-09-11 DIAGNOSIS — C50211 Malignant neoplasm of upper-inner quadrant of right female breast: Secondary | ICD-10-CM

## 2018-09-11 DIAGNOSIS — Z170 Estrogen receptor positive status [ER+]: Principal | ICD-10-CM

## 2018-09-11 DIAGNOSIS — G620 Drug-induced polyneuropathy: Secondary | ICD-10-CM

## 2018-09-11 DIAGNOSIS — Z5111 Encounter for antineoplastic chemotherapy: Secondary | ICD-10-CM | POA: Diagnosis not present

## 2018-09-11 DIAGNOSIS — R432 Parageusia: Secondary | ICD-10-CM | POA: Diagnosis not present

## 2018-09-11 DIAGNOSIS — D702 Other drug-induced agranulocytosis: Secondary | ICD-10-CM

## 2018-09-11 LAB — CBC WITH DIFFERENTIAL (CANCER CENTER ONLY)
Abs Immature Granulocytes: 0.11 10*3/uL — ABNORMAL HIGH (ref 0.00–0.07)
Basophils Absolute: 0 10*3/uL (ref 0.0–0.1)
Basophils Relative: 1 %
Eosinophils Absolute: 0 10*3/uL (ref 0.0–0.5)
Eosinophils Relative: 1 %
HCT: 34.7 % — ABNORMAL LOW (ref 36.0–46.0)
Hemoglobin: 11.3 g/dL — ABNORMAL LOW (ref 12.0–15.0)
Immature Granulocytes: 2 %
LYMPHS ABS: 0.7 10*3/uL (ref 0.7–4.0)
Lymphocytes Relative: 12 %
MCH: 31 pg (ref 26.0–34.0)
MCHC: 32.6 g/dL (ref 30.0–36.0)
MCV: 95.1 fL (ref 80.0–100.0)
Monocytes Absolute: 0.4 10*3/uL (ref 0.1–1.0)
Monocytes Relative: 6 %
Neutro Abs: 4.7 10*3/uL (ref 1.7–7.7)
Neutrophils Relative %: 78 %
Platelet Count: 226 10*3/uL (ref 150–400)
RBC: 3.65 MIL/uL — ABNORMAL LOW (ref 3.87–5.11)
RDW: 13.4 % (ref 11.5–15.5)
WBC Count: 6 10*3/uL (ref 4.0–10.5)
WBC Morphology: INCREASED
nRBC: 0 % (ref 0.0–0.2)

## 2018-09-11 LAB — CMP (CANCER CENTER ONLY)
ALK PHOS: 61 U/L (ref 38–126)
ALT: 30 U/L (ref 0–44)
AST: 24 U/L (ref 15–41)
Albumin: 3.9 g/dL (ref 3.5–5.0)
Anion gap: 10 (ref 5–15)
BUN: 7 mg/dL (ref 6–20)
CO2: 25 mmol/L (ref 22–32)
Calcium: 9.2 mg/dL (ref 8.9–10.3)
Chloride: 109 mmol/L (ref 98–111)
Creatinine: 0.74 mg/dL (ref 0.44–1.00)
GFR, Est AFR Am: >60 mL/min (ref >60)
GFR, Estimated: >60 mL/min (ref >60)
Glucose, Bld: 98 mg/dL (ref 70–99)
Potassium: 3.7 mmol/L (ref 3.5–5.1)
Sodium: 144 mmol/L (ref 135–145)
Total Bilirubin: 0.3 mg/dL (ref 0.3–1.2)
Total Protein: 7.2 g/dL (ref 6.5–8.1)

## 2018-09-11 MED ORDER — DIPHENHYDRAMINE HCL 50 MG/ML IJ SOLN
50.0000 mg | Freq: Once | INTRAMUSCULAR | Status: AC
  Administered 2018-09-11: 50 mg via INTRAVENOUS

## 2018-09-11 MED ORDER — SODIUM CHLORIDE 0.9 % IV SOLN
Freq: Once | INTRAVENOUS | Status: AC
  Administered 2018-09-11: 11:00:00 via INTRAVENOUS
  Filled 2018-09-11: qty 250

## 2018-09-11 MED ORDER — SODIUM CHLORIDE 0.9 % IV SOLN
80.0000 mg/m2 | Freq: Once | INTRAVENOUS | Status: AC
  Administered 2018-09-11: 138 mg via INTRAVENOUS
  Filled 2018-09-11: qty 23

## 2018-09-11 MED ORDER — HEPARIN SOD (PORK) LOCK FLUSH 100 UNIT/ML IV SOLN
500.0000 [IU] | Freq: Once | INTRAVENOUS | Status: AC | PRN
  Administered 2018-09-11: 500 [IU]
  Filled 2018-09-11: qty 5

## 2018-09-11 MED ORDER — DIPHENHYDRAMINE HCL 50 MG/ML IJ SOLN
INTRAMUSCULAR | Status: AC
  Filled 2018-09-11: qty 1

## 2018-09-11 MED ORDER — SODIUM CHLORIDE 0.9 % IV SOLN
20.0000 mg | Freq: Once | INTRAVENOUS | Status: AC
  Administered 2018-09-11: 20 mg via INTRAVENOUS
  Filled 2018-09-11: qty 20

## 2018-09-11 MED ORDER — SODIUM CHLORIDE 0.9 % IV SOLN
20.0000 mg | Freq: Once | INTRAVENOUS | Status: AC
  Administered 2018-09-11: 20 mg via INTRAVENOUS
  Filled 2018-09-11: qty 2

## 2018-09-11 MED ORDER — SODIUM CHLORIDE 0.9% FLUSH
10.0000 mL | INTRAVENOUS | Status: DC | PRN
  Administered 2018-09-11: 10 mL
  Filled 2018-09-11: qty 10

## 2018-09-11 NOTE — Assessment & Plan Note (Signed)
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%, PR 0%, HER-2 negative, Ki-67 20%; biopsy of the 4.5 cm mass came back as IDC grade 2-3, ER 50%, PR 5%, HER-2 -2+ ratio 1.6, copy #3.2, Ki-67 20%, T2N1 stage IIb  Recommendation: 1.Neoadjuvant chemotherapy with dose dense Adriamycinand Cytoxan x4 followed by Taxol weekly x12started 05/09/2018 2.followed byBilmastectomy and axillary lymph node dissection 3.Followed by radiation 4.Followed by adjuvant antiestrogen therapy ----------------------------------------------------------- Current treatment: Neoadjuvant dose dense Adriamycin and Cytoxan started11/22/2019, today cycle8Taxol  Chemo toxicities: 1.Dysgeusia: she is managing this well. 2. Neutropenia: we added Granix to the Saturday prior to her Tuesday treatments.   She is doing well otherwise. No peripheral neuropathy at this point. Monitoring closely for chemo toxicities Labs were reviewed Return to clinic weekly for Taxol and every 2 weeks for follow-up with me

## 2018-09-11 NOTE — Patient Instructions (Signed)
[facility] Discharge Instructions for Patients Receiving Chemotherapy  Today you received the following chemotherapy agents:  Taxol.  To help prevent nausea and vomiting after your treatment, we encourage you to take your nausea medication as directed.   If you develop nausea and vomiting that is not controlled by your nausea medication, call the clinic.   BELOW ARE SYMPTOMS THAT SHOULD BE REPORTED IMMEDIATELY:  *FEVER GREATER THAN 100.5 F  *CHILLS WITH OR WITHOUT FEVER  NAUSEA AND VOMITING THAT IS NOT CONTROLLED WITH YOUR NAUSEA MEDICATION  *UNUSUAL SHORTNESS OF BREATH  *UNUSUAL BRUISING OR BLEEDING  TENDERNESS IN MOUTH AND THROAT WITH OR WITHOUT PRESENCE OF ULCERS  *URINARY PROBLEMS  *BOWEL PROBLEMS  UNUSUAL RASH Items with * indicate a potential emergency and should be followed up as soon as possible.  Feel free to call the clinic should you have any questions or concerns. The clinic phone number is [phone].  Please show the CHEMO ALERT CARD at check-in to the Emergency Department and triage nurse.   

## 2018-09-12 ENCOUNTER — Telehealth: Payer: Self-pay | Admitting: Hematology and Oncology

## 2018-09-12 ENCOUNTER — Encounter: Payer: Self-pay | Admitting: Hematology and Oncology

## 2018-09-12 NOTE — Telephone Encounter (Signed)
Scheduled appt per 3/25 sch message - pt aware of appt dates and time

## 2018-09-15 ENCOUNTER — Inpatient Hospital Stay: Payer: BLUE CROSS/BLUE SHIELD

## 2018-09-15 ENCOUNTER — Other Ambulatory Visit: Payer: Self-pay

## 2018-09-15 VITALS — BP 136/90 | HR 101 | Temp 98.0°F | Resp 18

## 2018-09-15 DIAGNOSIS — Z5111 Encounter for antineoplastic chemotherapy: Secondary | ICD-10-CM | POA: Diagnosis not present

## 2018-09-15 DIAGNOSIS — Z170 Estrogen receptor positive status [ER+]: Secondary | ICD-10-CM

## 2018-09-15 DIAGNOSIS — D702 Other drug-induced agranulocytosis: Secondary | ICD-10-CM

## 2018-09-15 DIAGNOSIS — C50211 Malignant neoplasm of upper-inner quadrant of right female breast: Secondary | ICD-10-CM

## 2018-09-15 MED ORDER — TBO-FILGRASTIM 300 MCG/0.5ML ~~LOC~~ SOSY
300.0000 ug | PREFILLED_SYRINGE | Freq: Once | SUBCUTANEOUS | Status: AC
  Administered 2018-09-15: 300 ug via SUBCUTANEOUS

## 2018-09-15 MED ORDER — TBO-FILGRASTIM 300 MCG/0.5ML ~~LOC~~ SOSY
PREFILLED_SYRINGE | SUBCUTANEOUS | Status: AC
  Filled 2018-09-15: qty 0.5

## 2018-09-15 NOTE — Patient Instructions (Signed)
Tbo-Filgrastim injection What is this medicine? TBO-FILGRASTIM (T B O fil GRA stim) is a granulocyte colony-stimulating factor that stimulates the growth of neutrophils, a type of white blood cell important in the body's fight against infection. It is used to reduce the incidence of fever and infection in patients with certain types of cancer who are receiving chemotherapy that affects the bone marrow. This medicine may be used for other purposes; ask your health care provider or pharmacist if you have questions. COMMON BRAND NAME(S): Granix What should I tell my health care provider before I take this medicine? They need to know if you have any of these conditions: -bone scan or tests planned -kidney disease -sickle cell anemia -an unusual or allergic reaction to tbo-filgrastim, filgrastim, pegfilgrastim, other medicines, foods, dyes, or preservatives -pregnant or trying to get pregnant -breast-feeding How should I use this medicine? This medicine is for injection under the skin. If you get this medicine at home, you will be taught how to prepare and give this medicine. Refer to the Instructions for Use that come with your medication packaging. Use exactly as directed. Take your medicine at regular intervals. Do not take your medicine more often than directed. It is important that you put your used needles and syringes in a special sharps container. Do not put them in a trash can. If you do not have a sharps container, call your pharmacist or healthcare provider to get one. Talk to your pediatrician regarding the use of this medicine in children. While this drug may be prescribed for children as young as 1 month of age for selected conditions, precautions do apply. Overdosage: If you think you have taken too much of this medicine contact a poison control center or emergency room at once. NOTE: This medicine is only for you. Do not share this medicine with others. What if I miss a dose? It is  important not to miss your dose. Call your doctor or health care professional if you miss a dose. What may interact with this medicine? This medicine may interact with the following medications: -medicines that may cause a release of neutrophils, such as lithium This list may not describe all possible interactions. Give your health care provider a list of all the medicines, herbs, non-prescription drugs, or dietary supplements you use. Also tell them if you smoke, drink alcohol, or use illegal drugs. Some items may interact with your medicine. What should I watch for while using this medicine? You may need blood work done while you are taking this medicine. What side effects may I notice from receiving this medicine? Side effects that you should report to your doctor or health care professional as soon as possible: -allergic reactions like skin rash, itching or hives, swelling of the face, lips, or tongue -back pain -blood in the urine -dark urine -dizziness -fast heartbeat -feeling faint -shortness of breath or breathing problems -signs and symptoms of infection like fever or chills; cough; or sore throat -signs and symptoms of kidney injury like trouble passing urine or change in the amount of urine -stomach or side pain, or pain at the shoulder -sweating -swelling of the legs, ankles, or abdomen -tiredness Side effects that usually do not require medical attention (report to your doctor or health care professional if they continue or are bothersome): -bone pain -diarrhea -headache -muscle pain -vomiting This list may not describe all possible side effects. Call your doctor for medical advice about side effects. You may report side effects to FDA at   1-800-FDA-1088. Where should I keep my medicine? Keep out of the reach of children. Store in a refrigerator between 2 and 8 degrees C (36 and 46 degrees F). Keep in carton to protect from light. Throw away this medicine if it is left out  of the refrigerator for more than 5 consecutive days. Throw away any unused medicine after the expiration date. NOTE: This sheet is a summary. It may not cover all possible information. If you have questions about this medicine, talk to your doctor, pharmacist, or health care provider.  2019 Elsevier/Gold Standard (2017-01-24 16:56:18)  

## 2018-09-18 ENCOUNTER — Ambulatory Visit: Payer: BLUE CROSS/BLUE SHIELD

## 2018-09-18 ENCOUNTER — Other Ambulatory Visit: Payer: Self-pay

## 2018-09-18 ENCOUNTER — Inpatient Hospital Stay: Payer: BLUE CROSS/BLUE SHIELD

## 2018-09-18 ENCOUNTER — Other Ambulatory Visit: Payer: BLUE CROSS/BLUE SHIELD

## 2018-09-18 VITALS — BP 149/110 | HR 88 | Temp 98.2°F | Resp 18

## 2018-09-18 DIAGNOSIS — C50211 Malignant neoplasm of upper-inner quadrant of right female breast: Secondary | ICD-10-CM

## 2018-09-18 DIAGNOSIS — Z170 Estrogen receptor positive status [ER+]: Principal | ICD-10-CM

## 2018-09-18 DIAGNOSIS — D702 Other drug-induced agranulocytosis: Secondary | ICD-10-CM

## 2018-09-18 DIAGNOSIS — Z5111 Encounter for antineoplastic chemotherapy: Secondary | ICD-10-CM | POA: Diagnosis not present

## 2018-09-18 LAB — CBC WITH DIFFERENTIAL (CANCER CENTER ONLY)
Abs Immature Granulocytes: 0.11 10*3/uL — ABNORMAL HIGH (ref 0.00–0.07)
Basophils Absolute: 0 10*3/uL (ref 0.0–0.1)
Basophils Relative: 1 %
Eosinophils Absolute: 0.1 10*3/uL (ref 0.0–0.5)
Eosinophils Relative: 1 %
HCT: 35.2 % — ABNORMAL LOW (ref 36.0–46.0)
HEMOGLOBIN: 11.3 g/dL — AB (ref 12.0–15.0)
Immature Granulocytes: 2 %
LYMPHS ABS: 1.1 10*3/uL (ref 0.7–4.0)
Lymphocytes Relative: 16 %
MCH: 31.1 pg (ref 26.0–34.0)
MCHC: 32.1 g/dL (ref 30.0–36.0)
MCV: 97 fL (ref 80.0–100.0)
Monocytes Absolute: 0.5 10*3/uL (ref 0.1–1.0)
Monocytes Relative: 7 %
NRBC: 0 % (ref 0.0–0.2)
Neutro Abs: 4.8 10*3/uL (ref 1.7–7.7)
Neutrophils Relative %: 73 %
Platelet Count: 258 10*3/uL (ref 150–400)
RBC: 3.63 MIL/uL — ABNORMAL LOW (ref 3.87–5.11)
RDW: 13.4 % (ref 11.5–15.5)
WBC Count: 6.5 10*3/uL (ref 4.0–10.5)

## 2018-09-18 LAB — CMP (CANCER CENTER ONLY)
ALK PHOS: 60 U/L (ref 38–126)
ALT: 31 U/L (ref 0–44)
AST: 24 U/L (ref 15–41)
Albumin: 4 g/dL (ref 3.5–5.0)
Anion gap: 10 (ref 5–15)
BUN: 7 mg/dL (ref 6–20)
CO2: 25 mmol/L (ref 22–32)
Calcium: 9.6 mg/dL (ref 8.9–10.3)
Chloride: 108 mmol/L (ref 98–111)
Creatinine: 0.72 mg/dL (ref 0.44–1.00)
GFR, Est AFR Am: >60 mL/min (ref >60)
GFR, Estimated: >60 mL/min (ref >60)
Glucose, Bld: 86 mg/dL (ref 70–99)
Potassium: 3.8 mmol/L (ref 3.5–5.1)
Sodium: 143 mmol/L (ref 135–145)
Total Bilirubin: 0.3 mg/dL (ref 0.3–1.2)
Total Protein: 7.5 g/dL (ref 6.5–8.1)

## 2018-09-18 MED ORDER — SODIUM CHLORIDE 0.9 % IV SOLN
20.0000 mg | Freq: Once | INTRAVENOUS | Status: AC
  Administered 2018-09-18: 20 mg via INTRAVENOUS
  Filled 2018-09-18: qty 2

## 2018-09-18 MED ORDER — SODIUM CHLORIDE 0.9% FLUSH
10.0000 mL | INTRAVENOUS | Status: DC | PRN
  Administered 2018-09-18: 10 mL
  Filled 2018-09-18: qty 10

## 2018-09-18 MED ORDER — DIPHENHYDRAMINE HCL 50 MG/ML IJ SOLN
INTRAMUSCULAR | Status: AC
  Filled 2018-09-18: qty 1

## 2018-09-18 MED ORDER — SODIUM CHLORIDE 0.9 % IV SOLN
80.0000 mg/m2 | Freq: Once | INTRAVENOUS | Status: AC
  Administered 2018-09-18: 138 mg via INTRAVENOUS
  Filled 2018-09-18: qty 23

## 2018-09-18 MED ORDER — HEPARIN SOD (PORK) LOCK FLUSH 100 UNIT/ML IV SOLN
500.0000 [IU] | Freq: Once | INTRAVENOUS | Status: AC | PRN
  Administered 2018-09-18: 500 [IU]
  Filled 2018-09-18: qty 5

## 2018-09-18 MED ORDER — DIPHENHYDRAMINE HCL 50 MG/ML IJ SOLN
50.0000 mg | Freq: Once | INTRAMUSCULAR | Status: AC
  Administered 2018-09-18: 50 mg via INTRAVENOUS

## 2018-09-18 MED ORDER — SODIUM CHLORIDE 0.9 % IV SOLN
20.0000 mg | Freq: Once | INTRAVENOUS | Status: AC
  Administered 2018-09-18: 20 mg via INTRAVENOUS
  Filled 2018-09-18: qty 20

## 2018-09-18 MED ORDER — SODIUM CHLORIDE 0.9 % IV SOLN
Freq: Once | INTRAVENOUS | Status: AC
  Administered 2018-09-18: 14:00:00 via INTRAVENOUS
  Filled 2018-09-18: qty 250

## 2018-09-18 NOTE — Patient Instructions (Signed)
Coronavirus (COVID-19) Are you at risk?  Are you at risk for the Coronavirus (COVID-19)?  To be considered HIGH RISK for Coronavirus (COVID-19), you have to meet the following criteria:  . Traveled to [place], [place], [place], [place] or [place]; or in the [place] to [place], [place], [place], or [place]; and have fever, cough, and shortness of breath within the last 2 weeks of travel OR . Been in close contact with a person diagnosed with COVID-19 within the last 2 weeks and have fever, cough, and shortness of breath . IF YOU DO NOT MEET THESE CRITERIA, YOU ARE CONSIDERED LOW RISK FOR COVID-19.  What to do if you are HIGH RISK for COVID-19?  . If you are having a medical emergency, call 911. . Seek medical care right away. Before you go to a doctor's office, urgent care or emergency department, call ahead and tell them about your recent travel, contact with someone diagnosed with COVID-19, and your symptoms. You should receive instructions from your physician's office regarding next steps of care.  . When you arrive at healthcare provider, tell the healthcare staff immediately you have returned from visiting [place], [place], [place], [place] or [place]; or traveled in the [place] to [place], [place], [place], or [place]; in the last two weeks or you have been in close contact with a person diagnosed with COVID-19 in the last 2 weeks.   . Tell the health care staff about your symptoms: fever, cough and shortness of breath. . After you have been seen by a medical provider, you will be either: o Tested for (COVID-19) and discharged home on quarantine except to seek medical care if symptoms worsen, and asked to  - Stay home and avoid contact with others until you get your results (4-5 days)  - Avoid travel on public transportation if possible (such as bus, train, or airplane) or o Sent to the Emergency Department by EMS for evaluation, COVID-19 testing, and possible  admission depending on your condition and test results.  What to do if you are LOW RISK for COVID-19?  Reduce your risk of any infection by using the same precautions used for avoiding the common cold or flu:  . Wash your hands often with soap and warm water for at least 20 seconds.  If soap and water are not readily available, use an alcohol-based hand sanitizer with at least 60% alcohol.  . If coughing or sneezing, cover your mouth and nose by coughing or sneezing into the elbow areas of your shirt or coat, into a tissue or into your sleeve (not your hands). . Avoid shaking hands with others and consider head nods or verbal greetings only. . Avoid touching your eyes, nose, or mouth with unwashed hands.  . Avoid close contact with people who are sick. . Avoid places or events with large numbers of people in one location, like concerts or sporting events. . Carefully consider travel plans you have or are making. . If you are planning any travel outside or inside the [place], visit the CDC's Travelers' Health webpage for the latest health notices. . If you have some symptoms but not all symptoms, continue to monitor at home and seek medical attention if your symptoms worsen. . If you are having a medical emergency, call 911.   ADDITIONAL HEALTHCARE OPTIONS FOR PATIENTS  [facility] / e-Visit: [email or web address]         [facility]: [phone]  [facility]: [phone]                   [facility]: [phone]    Cancer Center Discharge Instructions for Patients Receiving Chemotherapy  Today you received the following chemotherapy agents Taxol   To help prevent nausea and vomiting after your treatment, we encourage you to take your nausea medication as directed.    If you develop nausea and vomiting that is not controlled by your nausea medication, call the clinic.   BELOW ARE  SYMPTOMS THAT SHOULD BE REPORTED IMMEDIATELY:  *FEVER GREATER THAN 100.5 F  *CHILLS WITH OR WITHOUT FEVER  NAUSEA AND VOMITING THAT IS NOT CONTROLLED WITH YOUR NAUSEA MEDICATION  *UNUSUAL SHORTNESS OF BREATH  *UNUSUAL BRUISING OR BLEEDING  TENDERNESS IN MOUTH AND THROAT WITH OR WITHOUT PRESENCE OF ULCERS  *URINARY PROBLEMS  *BOWEL PROBLEMS  UNUSUAL RASH Items with * indicate a potential emergency and should be followed up as soon as possible.  Feel free to call the clinic should you have any questions or concerns. The clinic phone number is [phone].  Please show the CHEMO ALERT CARD at check-in to the Emergency Department and triage nurse.   

## 2018-09-19 ENCOUNTER — Ambulatory Visit: Payer: BLUE CROSS/BLUE SHIELD

## 2018-09-19 ENCOUNTER — Other Ambulatory Visit: Payer: BLUE CROSS/BLUE SHIELD

## 2018-09-19 ENCOUNTER — Ambulatory Visit: Payer: BLUE CROSS/BLUE SHIELD | Admitting: Hematology and Oncology

## 2018-09-22 ENCOUNTER — Ambulatory Visit: Payer: BLUE CROSS/BLUE SHIELD

## 2018-09-22 ENCOUNTER — Other Ambulatory Visit: Payer: Self-pay

## 2018-09-22 ENCOUNTER — Inpatient Hospital Stay: Payer: BLUE CROSS/BLUE SHIELD | Attending: Hematology and Oncology

## 2018-09-22 VITALS — BP 158/77 | HR 110 | Temp 98.3°F | Resp 17

## 2018-09-22 DIAGNOSIS — G620 Drug-induced polyneuropathy: Secondary | ICD-10-CM | POA: Insufficient documentation

## 2018-09-22 DIAGNOSIS — G629 Polyneuropathy, unspecified: Secondary | ICD-10-CM | POA: Diagnosis not present

## 2018-09-22 DIAGNOSIS — Z452 Encounter for adjustment and management of vascular access device: Secondary | ICD-10-CM | POA: Insufficient documentation

## 2018-09-22 DIAGNOSIS — Z170 Estrogen receptor positive status [ER+]: Secondary | ICD-10-CM | POA: Diagnosis not present

## 2018-09-22 DIAGNOSIS — C50211 Malignant neoplasm of upper-inner quadrant of right female breast: Secondary | ICD-10-CM | POA: Diagnosis present

## 2018-09-22 DIAGNOSIS — D702 Other drug-induced agranulocytosis: Secondary | ICD-10-CM

## 2018-09-22 DIAGNOSIS — Z5111 Encounter for antineoplastic chemotherapy: Secondary | ICD-10-CM | POA: Insufficient documentation

## 2018-09-22 DIAGNOSIS — D701 Agranulocytosis secondary to cancer chemotherapy: Secondary | ICD-10-CM | POA: Diagnosis not present

## 2018-09-22 MED ORDER — TBO-FILGRASTIM 300 MCG/0.5ML ~~LOC~~ SOSY
300.0000 ug | PREFILLED_SYRINGE | Freq: Once | SUBCUTANEOUS | Status: AC
  Administered 2018-09-22: 300 ug via SUBCUTANEOUS

## 2018-09-22 MED ORDER — TBO-FILGRASTIM 480 MCG/0.8ML ~~LOC~~ SOSY
PREFILLED_SYRINGE | SUBCUTANEOUS | Status: AC
  Filled 2018-09-22: qty 0.8

## 2018-09-22 MED ORDER — TBO-FILGRASTIM 300 MCG/0.5ML ~~LOC~~ SOSY
PREFILLED_SYRINGE | SUBCUTANEOUS | Status: AC
  Filled 2018-09-22: qty 0.5

## 2018-09-24 NOTE — Progress Notes (Signed)
Patient Care Team: [name], DO as PCP - General (Family Medicine) [name], MD as Consulting Physician (General Surgery) [name], MD as Consulting Physician (Hematology and Oncology) Kyung Rudd, MD as Consulting Physician (Radiation Oncology)  DIAGNOSIS:    ICD-10-CM   1. Malignant neoplasm of upper-inner quadrant of right breast in female, estrogen receptor positive ([place]) C50.211    Z17.0     SUMMARY OF ONCOLOGIC HISTORY:   Malignant neoplasm of upper-inner quadrant of right breast in female, estrogen receptor positive ([place])   04/17/2018 Initial Diagnosis    Palpable right breast masses UOQ and UIQ with multiple masses 12 to 14 cm, at 1:00 1.6 cm, 1231.8 cm, and o'clock 4.5 cm and at 9:30 position 5 cm: Biopsy revealed IDC with DCIS, grade 3, ER 80%, PR 0%, HER-2 negative, Ki-67 20%; biopsy of the 4.5 cm mass came back as IDC grade 2-3, ER 50%, PR 5%, HER-2 -2+ ratio 1.6, copy #3.2, Ki-67 20%, T2N1 stage IIb    05/09/2018 -  Neo-Adjuvant Chemotherapy    Neoadjuvant chemotherapy with dose dense Adriamycin and Cytoxan x4 followed by Taxol weekly x12    06/19/2018 Genetic Testing    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  The Multi-Gene Panel offered by [facility] includes sequencing and/or deletion duplication testing of the following 84 genes: AIP, ALK, APC, ATM, AXIN2,BAP1,  BARD1, BLM, BMPR1A, BRCA1, BRCA2, BRIP1, CASR, CDC73, CDH1, CDK4, CDKN1B, CDKN1C, CDKN2A (p14ARF), CDKN2A (p16INK4a), CEBPA, CHEK2, CTNNA1, DICER1, DIS3L2, EGFR (c.2369C>T, p.Thr790Met variant only), EPCAM (Deletion/duplication testing only), FH, FLCN, GATA2, GPC3, GREM1 (Promoter region deletion/duplication testing only), HOXB13 (c.251G>A, p.Gly84Glu), HRAS, KIT, MAX, MEN1, MET, MITF (c.952G>A, p.Glu318Lys variant only), MLH1, MSH2, MSH3, MSH6, MUTYH, NBN, NF1, NF2, NTHL1, PALB2, PDGFRA, PHOX2B, PMS2, POLD1, POLE, POT1, PRKAR1A, PTCH1, PTEN, RAD50,  RAD51C, RAD51D, RB1, RECQL4, RET, RUNX1, SDHAF2, SDHA (sequence changes only), SDHB, SDHC, SDHD, SMAD4, SMARCA4, SMARCB1, SMARCE1, STK11, SUFU, TERC, TERT, TMEM127, TP53, TSC1, TSC2, VHL, WRN and WT1.  The report date is 06/19/2018.     CHIEF COMPLIANT: Cycle 10 Taxol  INTERVAL HISTORY: Miranda Ayers is a 33 y.o. with above-mentioned history of right breast cancer who completed 4 cycles of dose dense Adriamycin and Cytoxan and is currently on neoadjuvant chemotherapy withweeklyTaxol. She presents to the clinic alone today for cycle 10.  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  She does not have any neuropathy in her feet.  Denies any fatigue or shortness of breath.  Denies any fevers or chills.  REVIEW OF SYSTEMS:   Constitutional: Denies fevers, chills or abnormal weight loss Eyes: Denies blurriness of vision Ears, nose, mouth, throat, and face: Denies mucositis or sore throat Respiratory: Denies cough, dyspnea or wheezes Cardiovascular: Denies palpitation, chest discomfort Gastrointestinal: Denies nausea, heartburn or change in bowel habits Skin: Denies abnormal skin rashes Lymphatics: Denies new lymphadenopathy or easy bruising Neurological: Neuropathy grade 1 in the fingers and darkening of the nails and nailbeds. Behavioral/Psych: Mood is stable, no new changes  Extremities: No lower extremity edema Breast: denies any pain or lumps or nodules in either breasts All other systems were reviewed with the patient and are negative.  I have reviewed the past medical history, past surgical history, social history and family history with the patient and they are unchanged from previous note.  ALLERGIES:  has No Known Allergies.  MEDICATIONS:  Current Outpatient Medications  Medication Sig Dispense Refill  .  hydrochlorothiazide (HYDRODIURIL) 12.5 MG tablet Take 12.5 mg by mouth daily.    Marland Kitchen ibuprofen (ADVIL,MOTRIN) 800 MG tablet Take 1 tablet (800 mg total) by  mouth every 8 (eight) hours as needed. 30 tablet 0  . lidocaine-prilocaine (EMLA) cream Apply to affected area once 30 g 3  . LORazepam (ATIVAN) 0.5 MG tablet Take 1 tablet (0.5 mg total) by mouth at bedtime as needed (Nausea or vomiting). 30 tablet 0  . omeprazole (PRILOSEC) 40 MG capsule Take 1 capsule (40 mg total) by mouth daily. 30 capsule 0  . ondansetron (ZOFRAN) 8 MG tablet Take 1 tablet (8 mg total) by mouth 2 (two) times daily as needed. Start on the third day after chemotherapy. 30 tablet 1  . prochlorperazine (COMPAZINE) 10 MG tablet Take 1 tablet (10 mg total) by mouth every 6 (six) hours as needed (Nausea or vomiting). 30 tablet 1   No current facility-administered medications for this visit.    Facility-Administered Medications Ordered in Other Visits  Medication Dose Route Frequency Provider Last Rate Last Dose  . sodium chloride flush (NS) 0.9 % injection 10 mL  10 mL Intracatheter PRN [name], MD   10 mL at 08/07/18 1001    PHYSICAL EXAMINATION: ECOG PERFORMANCE STATUS: 1 - Symptomatic but completely ambulatory  Vitals:   09/25/18 1156  BP: (!) 137/100  Pulse: 100  Resp: 18  Temp: 98 F (36.7 C)  SpO2: 100%   Filed Weights   09/25/18 1156  Weight: 145 lb 12.8 oz (66.1 kg)    GENERAL: alert, no distress and comfortable SKIN: skin color, texture, turgor are normal, no rashes or significant lesions EYES: normal, Conjunctiva are pink and non-injected, sclera clear OROPHARYNX: no exudate, no erythema and lips, buccal mucosa, and tongue normal  NECK: supple, thyroid normal size, non-tender, without nodularity LYMPH: no palpable lymphadenopathy in the cervical, axillary or inguinal LUNGS: clear to auscultation and percussion with normal breathing effort HEART: regular rate & rhythm and no murmurs and no lower extremity edema ABDOMEN: abdomen soft, non-tender and normal bowel sounds MUSCULOSKELETAL: no cyanosis of digits and no clubbing  NEURO: alert &  oriented x 3 with fluent speech, no focal motor/sensory deficits EXTREMITIES: No lower extremity edema  LABORATORY DATA:  I have reviewed the data as listed CMP Latest Ref Rng & Units 09/18/2018 09/11/2018 09/04/2018  Glucose 70 - 99 mg/dL 86 98 93  BUN 6 - 20 mg/dL 7 7 10   Creatinine 0.44 - 1.00 mg/dL 0.72 0.74 0.70  Sodium 135 - 145 mmol/L 143 144 142  Potassium 3.5 - 5.1 mmol/L 3.8 3.7 3.9  Chloride 98 - 111 mmol/L 108 109 108  CO2 22 - 32 mmol/L 25 25 25   Calcium 8.9 - 10.3 mg/dL 9.6 9.2 9.6  Total Protein 6.5 - 8.1 g/dL 7.5 7.2 7.2  Total Bilirubin 0.3 - 1.2 mg/dL 0.3 0.3 0.3  Alkaline Phos 38 - 126 U/L 60 61 59  AST 15 - 41 U/L 24 24 23   ALT 0 - 44 U/L 31 30 34    Lab Results  Component Value Date   WBC 8.5 09/25/2018   HGB 11.6 (L) 09/25/2018   HCT 36.0 09/25/2018   MCV 95.0 09/25/2018   PLT 255 09/25/2018   NEUTROABS 6.9 09/25/2018    ASSESSMENT & PLAN:  Malignant neoplasm of upper-inner quadrant of right breast in female, estrogen receptor positive ([place]) 04/17/2018:Palpable right breast masses UOQ and UIQ with multiple masses 12 to 14 cm, at 1:00 1.6  cm, 1231.8 cm, and o'clock 4.5 cm and at 9:30 position 5 cm: Biopsy revealed IDC with DCIS, grade 3, ER 80%, PR 0%, HER-2 negative, Ki-67 20%; biopsy of the 4.5 cm mass came back as IDC grade 2-3, ER 50%, PR 5%, HER-2 -2+ ratio 1.6, copy #3.2, Ki-67 20%, T2N1 stage IIb  Recommendation: 1.Neoadjuvant chemotherapy with dose dense Adriamycinand Cytoxan x4 followed by Taxol weekly x12started 05/09/2018 2.followed byBilmastectomy and axillary lymph node dissection 3.Followed by radiation 4.Followed by adjuvant antiestrogen therapy ----------------------------------------------------------- Current treatment: Neoadjuvant dose dense Adriamycin and Cytoxan started11/22/2019, today cycle10Taxol  Chemo toxicities: 1.Dysgeusia: she is managing this well. 2. Neutropenia: we addedGranix to the Saturday prior to  her Tuesday treatments.  3.  Mild neuropathy: Intermittent.  We will continue to watch and monitor.   She is doing well otherwise. Peripheral neuropathy stable so we will continue and finish her treatments. monitoring closely for chemo toxicities Labs were reviewed  Breast MRI and surgery follow-up will be set up. She will need breast MRI and tumor board presentation to discuss the results. If for some reason surgery cannot happen immediately then we will start her on oral antiestrogen therapy.  Return to clinic in 2 weeks for her last cycle of chemo.    No orders of the defined types were placed in this encounter.  The patient has a good understanding of the overall plan. she agrees with it. she will call with any problems that may develop before the next visit here.  [name], MD 09/25/2018  Julious Oka Dorshimer am acting as scribe for Dr. [name].  I have reviewed the above documentation for accuracy and completeness, and I agree with the above.

## 2018-09-25 ENCOUNTER — Other Ambulatory Visit: Payer: BLUE CROSS/BLUE SHIELD

## 2018-09-25 ENCOUNTER — Inpatient Hospital Stay: Payer: BLUE CROSS/BLUE SHIELD

## 2018-09-25 ENCOUNTER — Ambulatory Visit: Payer: BLUE CROSS/BLUE SHIELD

## 2018-09-25 ENCOUNTER — Inpatient Hospital Stay (HOSPITAL_BASED_OUTPATIENT_CLINIC_OR_DEPARTMENT_OTHER): Payer: BLUE CROSS/BLUE SHIELD | Admitting: Hematology and Oncology

## 2018-09-25 ENCOUNTER — Other Ambulatory Visit: Payer: Self-pay

## 2018-09-25 DIAGNOSIS — Z170 Estrogen receptor positive status [ER+]: Secondary | ICD-10-CM

## 2018-09-25 DIAGNOSIS — C50211 Malignant neoplasm of upper-inner quadrant of right female breast: Secondary | ICD-10-CM | POA: Diagnosis not present

## 2018-09-25 DIAGNOSIS — D702 Other drug-induced agranulocytosis: Secondary | ICD-10-CM

## 2018-09-25 DIAGNOSIS — Z5111 Encounter for antineoplastic chemotherapy: Secondary | ICD-10-CM | POA: Diagnosis not present

## 2018-09-25 DIAGNOSIS — G629 Polyneuropathy, unspecified: Secondary | ICD-10-CM

## 2018-09-25 DIAGNOSIS — D701 Agranulocytosis secondary to cancer chemotherapy: Secondary | ICD-10-CM | POA: Diagnosis not present

## 2018-09-25 LAB — CBC WITH DIFFERENTIAL (CANCER CENTER ONLY)
Abs Immature Granulocytes: 0.08 10*3/uL — ABNORMAL HIGH (ref 0.00–0.07)
Basophils Absolute: 0 10*3/uL (ref 0.0–0.1)
Basophils Relative: 1 %
Eosinophils Absolute: 0.1 10*3/uL (ref 0.0–0.5)
Eosinophils Relative: 1 %
HCT: 36 % (ref 36.0–46.0)
Hemoglobin: 11.6 g/dL — ABNORMAL LOW (ref 12.0–15.0)
Immature Granulocytes: 1 %
Lymphocytes Relative: 10 %
Lymphs Abs: 0.9 10*3/uL (ref 0.7–4.0)
MCH: 30.6 pg (ref 26.0–34.0)
MCHC: 32.2 g/dL (ref 30.0–36.0)
MCV: 95 fL (ref 80.0–100.0)
Monocytes Absolute: 0.6 10*3/uL (ref 0.1–1.0)
Monocytes Relative: 7 %
Neutro Abs: 6.9 10*3/uL (ref 1.7–7.7)
Neutrophils Relative %: 80 %
Platelet Count: 255 10*3/uL (ref 150–400)
RBC: 3.79 MIL/uL — ABNORMAL LOW (ref 3.87–5.11)
RDW: 13.6 % (ref 11.5–15.5)
WBC Count: 8.5 10*3/uL (ref 4.0–10.5)
nRBC: 0 % (ref 0.0–0.2)

## 2018-09-25 LAB — CMP (CANCER CENTER ONLY)
ALT: 28 U/L (ref 0–44)
AST: 24 U/L (ref 15–41)
Albumin: 4 g/dL (ref 3.5–5.0)
Alkaline Phosphatase: 63 U/L (ref 38–126)
Anion gap: 10 (ref 5–15)
BUN: 7 mg/dL (ref 6–20)
CO2: 25 mmol/L (ref 22–32)
Calcium: 9.8 mg/dL (ref 8.9–10.3)
Chloride: 107 mmol/L (ref 98–111)
Creatinine: 0.73 mg/dL (ref 0.44–1.00)
GFR, Est AFR Am: >60 mL/min (ref >60)
GFR, Estimated: >60 mL/min (ref >60)
Glucose, Bld: 96 mg/dL (ref 70–99)
Potassium: 3.8 mmol/L (ref 3.5–5.1)
Sodium: 142 mmol/L (ref 135–145)
Total Bilirubin: 0.3 mg/dL (ref 0.3–1.2)
Total Protein: 7.5 g/dL (ref 6.5–8.1)

## 2018-09-25 MED ORDER — SODIUM CHLORIDE 0.9 % IV SOLN
80.0000 mg/m2 | Freq: Once | INTRAVENOUS | Status: AC
  Administered 2018-09-25: 138 mg via INTRAVENOUS
  Filled 2018-09-25: qty 23

## 2018-09-25 MED ORDER — SODIUM CHLORIDE 0.9 % IV SOLN
20.0000 mg | Freq: Once | INTRAVENOUS | Status: AC
  Administered 2018-09-25: 20 mg via INTRAVENOUS
  Filled 2018-09-25: qty 2

## 2018-09-25 MED ORDER — SODIUM CHLORIDE 0.9% FLUSH
10.0000 mL | INTRAVENOUS | Status: DC | PRN
  Administered 2018-09-25: 10 mL
  Filled 2018-09-25: qty 10

## 2018-09-25 MED ORDER — HEPARIN SOD (PORK) LOCK FLUSH 100 UNIT/ML IV SOLN
500.0000 [IU] | Freq: Once | INTRAVENOUS | Status: AC | PRN
  Administered 2018-09-25: 500 [IU]
  Filled 2018-09-25: qty 5

## 2018-09-25 MED ORDER — DIPHENHYDRAMINE HCL 50 MG/ML IJ SOLN
50.0000 mg | Freq: Once | INTRAMUSCULAR | Status: AC
  Administered 2018-09-25: 50 mg via INTRAVENOUS

## 2018-09-25 MED ORDER — SODIUM CHLORIDE 0.9 % IV SOLN
Freq: Once | INTRAVENOUS | Status: AC
  Administered 2018-09-25: 13:00:00 via INTRAVENOUS
  Filled 2018-09-25: qty 250

## 2018-09-25 MED ORDER — DIPHENHYDRAMINE HCL 50 MG/ML IJ SOLN
INTRAMUSCULAR | Status: AC
  Filled 2018-09-25: qty 1

## 2018-09-25 NOTE — Assessment & Plan Note (Addendum)
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%, PR 0%, HER-2 negative, Ki-67 20%; biopsy of the 4.5 cm mass came back as IDC grade 2-3, ER 50%, PR 5%, HER-2 -2+ ratio 1.6, copy #3.2, Ki-67 20%, T2N1 stage IIb  Recommendation: 1.Neoadjuvant chemotherapy with dose dense Adriamycinand Cytoxan x4 followed by Taxol weekly x12started 05/09/2018 2.followed byBilmastectomy and axillary lymph node dissection 3.Followed by radiation 4.Followed by adjuvant antiestrogen therapy ----------------------------------------------------------- Current treatment: Neoadjuvant dose dense Adriamycin and Cytoxan started11/22/2019, today cycle10Taxol  Chemo toxicities: 1.Dysgeusia: she is managing this well. 2. Neutropenia: we addedGranix to the Saturday prior to her Tuesday treatments.  3.  Mild neuropathy: Intermittent.  We will continue to watch and monitor.   She is doing well otherwise. No peripheral neuropathy at this point. Monitoring closely for chemo toxicities Labs were reviewed  Breast MRI and surgery follow-up will be set up. If for some reason surgery cannot happen immediately then we will start her on oral antiestrogen therapy.  Return to clinic in 2 weeks for her last cycle of chemo.

## 2018-09-25 NOTE — Patient Instructions (Signed)
Coronavirus (COVID-19) Are you at risk?  Are you at risk for the Coronavirus (COVID-19)?  To be considered HIGH RISK for Coronavirus (COVID-19), you have to meet the following criteria:  . Traveled to [place], [place], [place], [place] or [place]; or in the [place] to [place], [place], [place], or [place]; and have fever, cough, and shortness of breath within the last 2 weeks of travel OR . Been in close contact with a person diagnosed with COVID-19 within the last 2 weeks and have fever, cough, and shortness of breath . IF YOU DO NOT MEET THESE CRITERIA, YOU ARE CONSIDERED LOW RISK FOR COVID-19.  What to do if you are HIGH RISK for COVID-19?  . If you are having a medical emergency, call 911. . Seek medical care right away. Before you go to a doctor's office, urgent care or emergency department, call ahead and tell them about your recent travel, contact with someone diagnosed with COVID-19, and your symptoms. You should receive instructions from your physician's office regarding next steps of care.  . When you arrive at healthcare provider, tell the healthcare staff immediately you have returned from visiting [place], [place], [place], [place] or [place]; or traveled in the [place] to [place], [place], [place], or [place]; in the last two weeks or you have been in close contact with a person diagnosed with COVID-19 in the last 2 weeks.   . Tell the health care staff about your symptoms: fever, cough and shortness of breath. . After you have been seen by a medical provider, you will be either: o Tested for (COVID-19) and discharged home on quarantine except to seek medical care if symptoms worsen, and asked to  - Stay home and avoid contact with others until you get your results (4-5 days)  - Avoid travel on public transportation if possible (such as bus, train, or airplane) or o Sent to the Emergency Department by EMS for evaluation, COVID-19 testing, and possible  admission depending on your condition and test results.  What to do if you are LOW RISK for COVID-19?  Reduce your risk of any infection by using the same precautions used for avoiding the common cold or flu:  . Wash your hands often with soap and warm water for at least 20 seconds.  If soap and water are not readily available, use an alcohol-based hand sanitizer with at least 60% alcohol.  . If coughing or sneezing, cover your mouth and nose by coughing or sneezing into the elbow areas of your shirt or coat, into a tissue or into your sleeve (not your hands). . Avoid shaking hands with others and consider head nods or verbal greetings only. . Avoid touching your eyes, nose, or mouth with unwashed hands.  . Avoid close contact with people who are sick. . Avoid places or events with large numbers of people in one location, like concerts or sporting events. . Carefully consider travel plans you have or are making. . If you are planning any travel outside or inside the [place], visit the CDC's Travelers' Health webpage for the latest health notices. . If you have some symptoms but not all symptoms, continue to monitor at home and seek medical attention if your symptoms worsen. . If you are having a medical emergency, call 911.   ADDITIONAL HEALTHCARE OPTIONS FOR PATIENTS  [facility] / e-Visit: [email or web address]         [facility]: [phone]  [facility]: [phone]                   [facility]: [phone]   [facility] Discharge Instructions for Patients Receiving Chemotherapy  Today you received the following chemotherapy agents: Paclitaxel (Taxol)  To help prevent nausea and vomiting after your treatment, we encourage you to take your nausea medication as directed.    If you develop nausea and vomiting that is not controlled by your nausea medication, call the clinic.    BELOW ARE SYMPTOMS THAT SHOULD BE REPORTED IMMEDIATELY:  *FEVER GREATER THAN 100.5 F  *CHILLS WITH OR WITHOUT FEVER  NAUSEA AND VOMITING THAT IS NOT CONTROLLED WITH YOUR NAUSEA MEDICATION  *UNUSUAL SHORTNESS OF BREATH  *UNUSUAL BRUISING OR BLEEDING  TENDERNESS IN MOUTH AND THROAT WITH OR WITHOUT PRESENCE OF ULCERS  *URINARY PROBLEMS  *BOWEL PROBLEMS  UNUSUAL RASH Items with * indicate a potential emergency and should be followed up as soon as possible.  Feel free to call the clinic should you have any questions or concerns. The clinic phone number is [phone].  Please show the CHEMO ALERT CARD at check-in to the Emergency Department and triage nurse.   

## 2018-09-26 ENCOUNTER — Other Ambulatory Visit: Payer: BLUE CROSS/BLUE SHIELD

## 2018-09-26 ENCOUNTER — Ambulatory Visit: Payer: BLUE CROSS/BLUE SHIELD

## 2018-09-27 ENCOUNTER — Encounter: Payer: Self-pay | Admitting: *Deleted

## 2018-09-28 ENCOUNTER — Encounter: Payer: Self-pay | Admitting: General Practice

## 2018-09-28 NOTE — Progress Notes (Signed)
[facility] Support Services   [facility] Support Services Team contacted patient to assess for food insecurity and other psychosocial needs during current COVID19 pandemic.  No answer, left VM.  Miranda Ayers Tacoya Altizer, LCSW  [facility]        

## 2018-09-29 ENCOUNTER — Ambulatory Visit: Payer: BLUE CROSS/BLUE SHIELD

## 2018-09-29 ENCOUNTER — Other Ambulatory Visit: Payer: Self-pay

## 2018-09-29 ENCOUNTER — Inpatient Hospital Stay: Payer: BLUE CROSS/BLUE SHIELD

## 2018-09-29 VITALS — BP 145/105 | HR 83 | Temp 98.1°F | Resp 18

## 2018-09-29 DIAGNOSIS — Z170 Estrogen receptor positive status [ER+]: Secondary | ICD-10-CM

## 2018-09-29 DIAGNOSIS — D702 Other drug-induced agranulocytosis: Secondary | ICD-10-CM

## 2018-09-29 DIAGNOSIS — Z5111 Encounter for antineoplastic chemotherapy: Secondary | ICD-10-CM | POA: Diagnosis not present

## 2018-09-29 DIAGNOSIS — C50211 Malignant neoplasm of upper-inner quadrant of right female breast: Secondary | ICD-10-CM

## 2018-09-29 MED ORDER — TBO-FILGRASTIM 300 MCG/0.5ML ~~LOC~~ SOSY
300.0000 ug | PREFILLED_SYRINGE | Freq: Once | SUBCUTANEOUS | Status: AC
  Administered 2018-09-29: 300 ug via SUBCUTANEOUS

## 2018-09-29 MED ORDER — TBO-FILGRASTIM 300 MCG/0.5ML ~~LOC~~ SOSY
PREFILLED_SYRINGE | SUBCUTANEOUS | Status: AC
  Filled 2018-09-29: qty 0.5

## 2018-10-02 ENCOUNTER — Inpatient Hospital Stay: Payer: BLUE CROSS/BLUE SHIELD

## 2018-10-02 ENCOUNTER — Other Ambulatory Visit: Payer: Self-pay

## 2018-10-02 ENCOUNTER — Other Ambulatory Visit: Payer: BLUE CROSS/BLUE SHIELD

## 2018-10-02 ENCOUNTER — Ambulatory Visit: Payer: BLUE CROSS/BLUE SHIELD

## 2018-10-02 VITALS — BP 142/96 | HR 108 | Temp 98.6°F | Resp 16

## 2018-10-02 DIAGNOSIS — Z5111 Encounter for antineoplastic chemotherapy: Secondary | ICD-10-CM | POA: Diagnosis not present

## 2018-10-02 DIAGNOSIS — C50211 Malignant neoplasm of upper-inner quadrant of right female breast: Secondary | ICD-10-CM

## 2018-10-02 DIAGNOSIS — Z170 Estrogen receptor positive status [ER+]: Principal | ICD-10-CM

## 2018-10-02 DIAGNOSIS — D702 Other drug-induced agranulocytosis: Secondary | ICD-10-CM

## 2018-10-02 LAB — CBC WITH DIFFERENTIAL (CANCER CENTER ONLY)
Abs Immature Granulocytes: 0.16 10*3/uL — ABNORMAL HIGH (ref 0.00–0.07)
Basophils Absolute: 0.1 10*3/uL (ref 0.0–0.1)
Basophils Relative: 1 %
Eosinophils Absolute: 0.1 10*3/uL (ref 0.0–0.5)
Eosinophils Relative: 1 %
HCT: 34.9 % — ABNORMAL LOW (ref 36.0–46.0)
Hemoglobin: 11.3 g/dL — ABNORMAL LOW (ref 12.0–15.0)
Immature Granulocytes: 2 %
Lymphocytes Relative: 11 %
Lymphs Abs: 1.1 10*3/uL (ref 0.7–4.0)
MCH: 30.9 pg (ref 26.0–34.0)
MCHC: 32.4 g/dL (ref 30.0–36.0)
MCV: 95.4 fL (ref 80.0–100.0)
Monocytes Absolute: 0.7 10*3/uL (ref 0.1–1.0)
Monocytes Relative: 7 %
Neutro Abs: 8.1 10*3/uL — ABNORMAL HIGH (ref 1.7–7.7)
Neutrophils Relative %: 78 %
Platelet Count: 232 10*3/uL (ref 150–400)
RBC: 3.66 MIL/uL — ABNORMAL LOW (ref 3.87–5.11)
RDW: 13.9 % (ref 11.5–15.5)
WBC Count: 10.1 10*3/uL (ref 4.0–10.5)
nRBC: 0 % (ref 0.0–0.2)

## 2018-10-02 LAB — CMP (CANCER CENTER ONLY)
ALT: 13 U/L (ref 0–44)
AST: 15 U/L (ref 15–41)
Albumin: 3.8 g/dL (ref 3.5–5.0)
Alkaline Phosphatase: 69 U/L (ref 38–126)
Anion gap: 11 (ref 5–15)
BUN: 6 mg/dL (ref 6–20)
CO2: 23 mmol/L (ref 22–32)
Calcium: 9.4 mg/dL (ref 8.9–10.3)
Chloride: 106 mmol/L (ref 98–111)
Creatinine: 0.72 mg/dL (ref 0.44–1.00)
GFR, Est AFR Am: >60 mL/min (ref >60)
GFR, Estimated: >60 mL/min (ref >60)
Glucose, Bld: 99 mg/dL (ref 70–99)
Potassium: 3.6 mmol/L (ref 3.5–5.1)
Sodium: 140 mmol/L (ref 135–145)
Total Bilirubin: 0.2 mg/dL — ABNORMAL LOW (ref 0.3–1.2)
Total Protein: 7.4 g/dL (ref 6.5–8.1)

## 2018-10-02 MED ORDER — SODIUM CHLORIDE 0.9% FLUSH
10.0000 mL | INTRAVENOUS | Status: DC | PRN
  Administered 2018-10-02: 10 mL
  Filled 2018-10-02: qty 10

## 2018-10-02 MED ORDER — SODIUM CHLORIDE 0.9 % IV SOLN
20.0000 mg | Freq: Once | INTRAVENOUS | Status: AC
  Administered 2018-10-02: 20 mg via INTRAVENOUS
  Filled 2018-10-02: qty 2

## 2018-10-02 MED ORDER — DIPHENHYDRAMINE HCL 50 MG/ML IJ SOLN
INTRAMUSCULAR | Status: AC
  Filled 2018-10-02: qty 1

## 2018-10-02 MED ORDER — HEPARIN SOD (PORK) LOCK FLUSH 100 UNIT/ML IV SOLN
500.0000 [IU] | Freq: Once | INTRAVENOUS | Status: AC | PRN
  Administered 2018-10-02: 16:00:00 500 [IU]
  Filled 2018-10-02: qty 5

## 2018-10-02 MED ORDER — DIPHENHYDRAMINE HCL 50 MG/ML IJ SOLN
50.0000 mg | Freq: Once | INTRAMUSCULAR | Status: AC
  Administered 2018-10-02: 13:00:00 50 mg via INTRAVENOUS

## 2018-10-02 MED ORDER — SODIUM CHLORIDE 0.9 % IV SOLN
80.0000 mg/m2 | Freq: Once | INTRAVENOUS | Status: AC
  Administered 2018-10-02: 138 mg via INTRAVENOUS
  Filled 2018-10-02: qty 23

## 2018-10-02 MED ORDER — SODIUM CHLORIDE 0.9 % IV SOLN
Freq: Once | INTRAVENOUS | Status: AC
  Administered 2018-10-02: 13:00:00 via INTRAVENOUS
  Filled 2018-10-02: qty 250

## 2018-10-02 NOTE — Patient Instructions (Signed)
[facility] Discharge Instructions for Patients Receiving Chemotherapy  Today you received the following chemotherapy agents:  Taxol.  To help prevent nausea and vomiting after your treatment, we encourage you to take your nausea medication as directed.   If you develop nausea and vomiting that is not controlled by your nausea medication, call the clinic.   BELOW ARE SYMPTOMS THAT SHOULD BE REPORTED IMMEDIATELY:  *FEVER GREATER THAN 100.5 F  *CHILLS WITH OR WITHOUT FEVER  NAUSEA AND VOMITING THAT IS NOT CONTROLLED WITH YOUR NAUSEA MEDICATION  *UNUSUAL SHORTNESS OF BREATH  *UNUSUAL BRUISING OR BLEEDING  TENDERNESS IN MOUTH AND THROAT WITH OR WITHOUT PRESENCE OF ULCERS  *URINARY PROBLEMS  *BOWEL PROBLEMS  UNUSUAL RASH Items with * indicate a potential emergency and should be followed up as soon as possible.  Feel free to call the clinic should you have any questions or concerns. The clinic phone number is [phone].  Please show the CHEMO ALERT CARD at check-in to the Emergency Department and triage nurse.   

## 2018-10-02 NOTE — Progress Notes (Signed)
Per Dr. Lindi Adie, ok to treat with elevated BP and HR. Patient asymptomatic.

## 2018-10-03 ENCOUNTER — Other Ambulatory Visit: Payer: BLUE CROSS/BLUE SHIELD

## 2018-10-03 ENCOUNTER — Ambulatory Visit: Payer: BLUE CROSS/BLUE SHIELD | Admitting: Hematology and Oncology

## 2018-10-03 ENCOUNTER — Ambulatory Visit: Payer: BLUE CROSS/BLUE SHIELD

## 2018-10-06 ENCOUNTER — Ambulatory Visit: Payer: BLUE CROSS/BLUE SHIELD

## 2018-10-06 ENCOUNTER — Other Ambulatory Visit: Payer: Self-pay

## 2018-10-06 ENCOUNTER — Inpatient Hospital Stay: Payer: BLUE CROSS/BLUE SHIELD

## 2018-10-06 VITALS — BP 138/96 | HR 105 | Temp 99.5°F | Resp 18

## 2018-10-06 DIAGNOSIS — D702 Other drug-induced agranulocytosis: Secondary | ICD-10-CM

## 2018-10-06 DIAGNOSIS — Z5111 Encounter for antineoplastic chemotherapy: Secondary | ICD-10-CM | POA: Diagnosis not present

## 2018-10-06 DIAGNOSIS — Z170 Estrogen receptor positive status [ER+]: Secondary | ICD-10-CM

## 2018-10-06 DIAGNOSIS — C50211 Malignant neoplasm of upper-inner quadrant of right female breast: Secondary | ICD-10-CM

## 2018-10-06 MED ORDER — TBO-FILGRASTIM 300 MCG/0.5ML ~~LOC~~ SOSY
300.0000 ug | PREFILLED_SYRINGE | Freq: Once | SUBCUTANEOUS | Status: AC
  Administered 2018-10-06: 300 ug via SUBCUTANEOUS

## 2018-10-06 MED ORDER — TBO-FILGRASTIM 300 MCG/0.5ML ~~LOC~~ SOSY
PREFILLED_SYRINGE | SUBCUTANEOUS | Status: AC
  Filled 2018-10-06: qty 0.5

## 2018-10-06 NOTE — Patient Instructions (Signed)
Tbo-Filgrastim injection What is this medicine? TBO-FILGRASTIM (T B O fil GRA stim) is a granulocyte colony-stimulating factor that stimulates the growth of neutrophils, a type of white blood cell important in the body's fight against infection. It is used to reduce the incidence of fever and infection in patients with certain types of cancer who are receiving chemotherapy that affects the bone marrow. This medicine may be used for other purposes; ask your health care provider or pharmacist if you have questions. COMMON BRAND NAME(S): Granix What should I tell my health care provider before I take this medicine? They need to know if you have any of these conditions: -bone scan or tests planned -kidney disease -sickle cell anemia -an unusual or allergic reaction to tbo-filgrastim, filgrastim, pegfilgrastim, other medicines, foods, dyes, or preservatives -pregnant or trying to get pregnant -breast-feeding How should I use this medicine? This medicine is for injection under the skin. If you get this medicine at home, you will be taught how to prepare and give this medicine. Refer to the Instructions for Use that come with your medication packaging. Use exactly as directed. Take your medicine at regular intervals. Do not take your medicine more often than directed. It is important that you put your used needles and syringes in a special sharps container. Do not put them in a trash can. If you do not have a sharps container, call your pharmacist or healthcare provider to get one. Talk to your pediatrician regarding the use of this medicine in children. While this drug may be prescribed for children as young as 1 month of age for selected conditions, precautions do apply. Overdosage: If you think you have taken too much of this medicine contact a poison control center or emergency room at once. NOTE: This medicine is only for you. Do not share this medicine with others. What if I miss a dose? It is  important not to miss your dose. Call your doctor or health care professional if you miss a dose. What may interact with this medicine? This medicine may interact with the following medications: -medicines that may cause a release of neutrophils, such as lithium This list may not describe all possible interactions. Give your health care provider a list of all the medicines, herbs, non-prescription drugs, or dietary supplements you use. Also tell them if you smoke, drink alcohol, or use illegal drugs. Some items may interact with your medicine. What should I watch for while using this medicine? You may need blood work done while you are taking this medicine. What side effects may I notice from receiving this medicine? Side effects that you should report to your doctor or health care professional as soon as possible: -allergic reactions like skin rash, itching or hives, swelling of the face, lips, or tongue -back pain -blood in the urine -dark urine -dizziness -fast heartbeat -feeling faint -shortness of breath or breathing problems -signs and symptoms of infection like fever or chills; cough; or sore throat -signs and symptoms of kidney injury like trouble passing urine or change in the amount of urine -stomach or side pain, or pain at the shoulder -sweating -swelling of the legs, ankles, or abdomen -tiredness Side effects that usually do not require medical attention (report to your doctor or health care professional if they continue or are bothersome): -bone pain -diarrhea -headache -muscle pain -vomiting This list may not describe all possible side effects. Call your doctor for medical advice about side effects. You may report side effects to FDA at   1-800-FDA-1088. Where should I keep my medicine? Keep out of the reach of children. Store in a refrigerator between 2 and 8 degrees C (36 and 46 degrees F). Keep in carton to protect from light. Throw away this medicine if it is left out  of the refrigerator for more than 5 consecutive days. Throw away any unused medicine after the expiration date. NOTE: This sheet is a summary. It may not cover all possible information. If you have questions about this medicine, talk to your doctor, pharmacist, or health care provider.  2019 Elsevier/Gold Standard (2017-01-24 16:56:18)  

## 2018-10-08 NOTE — Progress Notes (Signed)
Patient Care Team: [name], DO as PCP - General (Family Medicine) [name], MD as Consulting Physician (General Surgery) [name], MD as Consulting Physician (Hematology and Oncology) [name], RN as Oncology Nurse Navigator [name], RN as Oncology Nurse Navigator  DIAGNOSIS:    ICD-10-CM   1. Malignant neoplasm of upper-inner quadrant of right breast in female, estrogen receptor positive ([place]) C50.211    Z17.0     SUMMARY OF ONCOLOGIC HISTORY:   Malignant neoplasm of upper-inner quadrant of right breast in female, estrogen receptor positive ([place])   04/17/2018 Initial Diagnosis    Palpable right breast masses UOQ and UIQ with multiple masses 12 to 14 cm, at 1:00 1.6 cm, 1231.8 cm, and o'clock 4.5 cm and at 9:30 position 5 cm: Biopsy revealed IDC with DCIS, grade 3, ER 80%, PR 0%, HER-2 negative, Ki-67 20%; biopsy of the 4.5 cm mass came back as IDC grade 2-3, ER 50%, PR 5%, HER-2 -2+ ratio 1.6, copy #3.2, Ki-67 20%, T2N1 stage IIb    05/09/2018 - 10/08/2018 Neo-Adjuvant Chemotherapy    Neoadjuvant chemotherapy with dose dense Adriamycin and Cytoxan x4 followed by Taxol weekly x12    06/19/2018 Genetic Testing    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  The Multi-Gene Panel offered by [facility] includes sequencing and/or deletion duplication testing of the following 84 genes: AIP, ALK, APC, ATM, AXIN2,BAP1,  BARD1, BLM, BMPR1A, BRCA1, BRCA2, BRIP1, CASR, CDC73, CDH1, CDK4, CDKN1B, CDKN1C, CDKN2A (p14ARF), CDKN2A (p16INK4a), CEBPA, CHEK2, CTNNA1, DICER1, DIS3L2, EGFR (c.2369C>T, p.Thr790Met variant only), EPCAM (Deletion/duplication testing only), FH, FLCN, GATA2, GPC3, GREM1 (Promoter region deletion/duplication testing only), HOXB13 (c.251G>A, p.Gly84Glu), HRAS, KIT, MAX, MEN1, MET, MITF (c.952G>A, p.Glu318Lys variant only), MLH1, MSH2, MSH3, MSH6, MUTYH, NBN, NF1, NF2, NTHL1, PALB2, PDGFRA, PHOX2B, PMS2,  POLD1, POLE, POT1, PRKAR1A, PTCH1, PTEN, RAD50, RAD51C, RAD51D, RB1, RECQL4, RET, RUNX1, SDHAF2, SDHA (sequence changes only), SDHB, SDHC, SDHD, SMAD4, SMARCA4, SMARCB1, SMARCE1, STK11, SUFU, TERC, TERT, TMEM127, TP53, TSC1, TSC2, VHL, WRN and WT1.  The report date is 06/19/2018.     CHIEF COMPLIANT: Cycle 12 Taxol being stopped because of neuropathy.  INTERVAL HISTORY: Miranda Ayers is a 33 y.o. with above-mentioned history of right breast cancer who completed 4 Ayers of dose dense Adriamycin and Cytoxan and is currently on neoadjuvant chemotherapy withweeklyTaxol.She presents to the clinicalonetoday for cycle 12.  However today's treatment will be held because she has markedly worsened peripheral neuropathy with pain and discomfort in the fingers.  She has not dropped any objects and is still able to button her shirt and do activities with her fingers but there is been a dramatic change in this from last week.  REVIEW OF SYSTEMS:   Constitutional: Denies fevers, chills or abnormal weight loss Eyes: Denies blurriness of vision Ears, nose, mouth, throat, and face: Denies mucositis or sore throat Respiratory: Denies cough, dyspnea or wheezes Cardiovascular: Denies palpitation, chest discomfort Gastrointestinal: Denies nausea, heartburn or change in bowel habits Skin: Denies abnormal skin rashes Lymphatics: Denies new lymphadenopathy or easy bruising Neurological: Worsening pain and discomfort in her fingers from peripheral neuropathy. Behavioral/Psych: Mood is stable, no new changes  Extremities: No lower extremity edema Breast: denies any pain or lumps or nodules in either breasts All other systems were reviewed with the patient and are negative.  I have reviewed the past medical history, past surgical history, social history and family history with the patient and they are  unchanged from previous note.  ALLERGIES:  has No Known Allergies.  MEDICATIONS:  Current Outpatient  Medications  Medication Sig Dispense Refill  . hydrochlorothiazide (HYDRODIURIL) 12.5 MG tablet Take 12.5 mg by mouth daily.    Marland Kitchen ibuprofen (ADVIL,MOTRIN) 800 MG tablet Take 1 tablet (800 mg total) by mouth every 8 (eight) hours as needed. 30 tablet 0  . lidocaine-prilocaine (EMLA) cream Apply to affected area once 30 g 3  . LORazepam (ATIVAN) 0.5 MG tablet Take 1 tablet (0.5 mg total) by mouth at bedtime as needed (Nausea or vomiting). 30 tablet 0  . omeprazole (PRILOSEC) 40 MG capsule Take 1 capsule (40 mg total) by mouth daily. 30 capsule 0  . ondansetron (ZOFRAN) 8 MG tablet Take 1 tablet (8 mg total) by mouth 2 (two) times daily as needed. Start on the third day after chemotherapy. 30 tablet 1  . prochlorperazine (COMPAZINE) 10 MG tablet Take 1 tablet (10 mg total) by mouth every 6 (six) hours as needed (Nausea or vomiting). 30 tablet 1   No current facility-administered medications for this visit.    Facility-Administered Medications Ordered in Other Visits  Medication Dose Route Frequency Provider Last Rate Last Dose  . sodium chloride flush (NS) 0.9 % injection 10 mL  10 mL Intracatheter PRN [name], MD   10 mL at 08/07/18 1001    PHYSICAL EXAMINATION: ECOG PERFORMANCE STATUS: 1 - Symptomatic but completely ambulatory  Vitals:   10/09/18 1049  BP: (!) 139/95  Pulse: 100  Resp: 17  Temp: 98.2 F (36.8 C)  SpO2: 100%   Filed Weights   10/09/18 1049  Weight: 148 lb 11.2 oz (67.4 kg)    GENERAL: alert, no distress and comfortable SKIN: skin color, texture, turgor are normal, no rashes or significant lesions EYES: normal, Conjunctiva are pink and non-injected, sclera clear OROPHARYNX: no exudate, no erythema and lips, buccal mucosa, and tongue normal  NECK: supple, thyroid normal size, non-tender, without nodularity LYMPH: no palpable lymphadenopathy in the cervical, axillary or inguinal LUNGS: clear to auscultation and percussion with normal breathing effort HEART:  regular rate & rhythm and no murmurs and no lower extremity edema ABDOMEN: abdomen soft, non-tender and normal bowel sounds MUSCULOSKELETAL: no cyanosis of digits and no clubbing  NEURO: alert & oriented x 3 with fluent speech, severe peripheral neuropathy EXTREMITIES: No lower extremity edema  LABORATORY DATA:  I have reviewed the data as listed CMP Latest Ref Rng & Units 10/02/2018 09/25/2018 09/18/2018  Glucose 70 - 99 mg/dL 99 96 86  BUN 6 - 20 mg/dL 6 7 7   Creatinine 0.44 - 1.00 mg/dL 0.72 0.73 0.72  Sodium 135 - 145 mmol/L 140 142 143  Potassium 3.5 - 5.1 mmol/L 3.6 3.8 3.8  Chloride 98 - 111 mmol/L 106 107 108  CO2 22 - 32 mmol/L 23 25 25   Calcium 8.9 - 10.3 mg/dL 9.4 9.8 9.6  Total Protein 6.5 - 8.1 g/dL 7.4 7.5 7.5  Total Bilirubin 0.3 - 1.2 mg/dL 0.2(L) 0.3 0.3  Alkaline Phos 38 - 126 U/L 69 63 60  AST 15 - 41 U/L 15 24 24   ALT 0 - 44 U/L 13 28 31     Lab Results  Component Value Date   WBC 9.2 10/09/2018   HGB 11.6 (L) 10/09/2018   HCT 36.1 10/09/2018   MCV 96.5 10/09/2018   PLT 270 10/09/2018   NEUTROABS 7.4 10/09/2018    ASSESSMENT & PLAN:  Malignant neoplasm of upper-inner quadrant of right breast  in female, estrogen receptor positive ([place]) 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%, PR 0%, HER-2 negative, Ki-67 20%; biopsy of the 4.5 cm mass came back as IDC grade 2-3, ER 50%, PR 5%, HER-2 -2+ ratio 1.6, copy #3.2, Ki-67 20%, T2N1 stage IIb  Recommendation: 1.Neoadjuvant chemotherapy with dose dense Adriamycinand Cytoxan x4 followed by Taxol weekly x12started 05/09/2018 2.followed byBilmastectomy and axillary lymph node dissection 3.Followed by radiation 4.Followed by adjuvant antiestrogen therapy ----------------------------------------------------------- Current treatment: Neoadjuvant dose  dense Adriamycin and Cytoxan started11/22/2019, today cycle12Taxol being stopped because of neuropathy  Chemo toxicities: 1.Dysgeusia: she is managing this well. 2. Neutropenia: we addedGranix to the Saturday prior to her Tuesday treatments.  3.Chemo-induced peripheral neuropathy: It has gotten markedly worse since last chemotherapy.  It is causing her significant pain and discomfort.  Because of this I recommended discontinuation of this last chemo and proceeding with MRI and surgery follow-ups.  Breast MRI and surgery follow-up will be set up. WebEx visit to discuss the results of breast MRI    No orders of the defined types were placed in this encounter.  The patient has a good understanding of the overall plan. she agrees with it. she will call with any problems that may develop before the next visit here.  [name], MD 10/09/2018  Julious Oka Dorshimer am acting as scribe for Dr. [name].  I have reviewed the above documentation for accuracy and completeness, and I agree with the above.

## 2018-10-09 ENCOUNTER — Other Ambulatory Visit: Payer: BLUE CROSS/BLUE SHIELD

## 2018-10-09 ENCOUNTER — Inpatient Hospital Stay: Payer: BLUE CROSS/BLUE SHIELD

## 2018-10-09 ENCOUNTER — Inpatient Hospital Stay (HOSPITAL_BASED_OUTPATIENT_CLINIC_OR_DEPARTMENT_OTHER): Payer: BLUE CROSS/BLUE SHIELD | Admitting: Hematology and Oncology

## 2018-10-09 ENCOUNTER — Ambulatory Visit: Payer: BLUE CROSS/BLUE SHIELD

## 2018-10-09 ENCOUNTER — Other Ambulatory Visit: Payer: Self-pay

## 2018-10-09 VITALS — BP 139/95 | HR 100 | Temp 98.2°F | Resp 17 | Ht 61.0 in | Wt 148.7 lb

## 2018-10-09 DIAGNOSIS — D702 Other drug-induced agranulocytosis: Secondary | ICD-10-CM

## 2018-10-09 DIAGNOSIS — C50211 Malignant neoplasm of upper-inner quadrant of right female breast: Secondary | ICD-10-CM

## 2018-10-09 DIAGNOSIS — D701 Agranulocytosis secondary to cancer chemotherapy: Secondary | ICD-10-CM

## 2018-10-09 DIAGNOSIS — G620 Drug-induced polyneuropathy: Secondary | ICD-10-CM | POA: Diagnosis not present

## 2018-10-09 DIAGNOSIS — Z170 Estrogen receptor positive status [ER+]: Secondary | ICD-10-CM

## 2018-10-09 DIAGNOSIS — Z5111 Encounter for antineoplastic chemotherapy: Secondary | ICD-10-CM | POA: Diagnosis not present

## 2018-10-09 LAB — CBC WITH DIFFERENTIAL (CANCER CENTER ONLY)
Abs Immature Granulocytes: 0.13 10*3/uL — ABNORMAL HIGH (ref 0.00–0.07)
Basophils Absolute: 0 10*3/uL (ref 0.0–0.1)
Basophils Relative: 0 %
Eosinophils Absolute: 0.1 10*3/uL (ref 0.0–0.5)
Eosinophils Relative: 1 %
HCT: 36.1 % (ref 36.0–46.0)
Hemoglobin: 11.6 g/dL — ABNORMAL LOW (ref 12.0–15.0)
Immature Granulocytes: 1 %
Lymphocytes Relative: 10 %
Lymphs Abs: 0.9 10*3/uL (ref 0.7–4.0)
MCH: 31 pg (ref 26.0–34.0)
MCHC: 32.1 g/dL (ref 30.0–36.0)
MCV: 96.5 fL (ref 80.0–100.0)
Monocytes Absolute: 0.7 10*3/uL (ref 0.1–1.0)
Monocytes Relative: 8 %
Neutro Abs: 7.4 10*3/uL (ref 1.7–7.7)
Neutrophils Relative %: 80 %
Platelet Count: 270 10*3/uL (ref 150–400)
RBC: 3.74 MIL/uL — ABNORMAL LOW (ref 3.87–5.11)
RDW: 14 % (ref 11.5–15.5)
WBC Count: 9.2 10*3/uL (ref 4.0–10.5)
nRBC: 0 % (ref 0.0–0.2)

## 2018-10-09 LAB — CMP (CANCER CENTER ONLY)
ALT: 18 U/L (ref 0–44)
AST: 18 U/L (ref 15–41)
Albumin: 3.8 g/dL (ref 3.5–5.0)
Alkaline Phosphatase: 68 U/L (ref 38–126)
Anion gap: 10 (ref 5–15)
BUN: 5 mg/dL — ABNORMAL LOW (ref 6–20)
CO2: 24 mmol/L (ref 22–32)
Calcium: 9.7 mg/dL (ref 8.9–10.3)
Chloride: 106 mmol/L (ref 98–111)
Creatinine: 0.67 mg/dL (ref 0.44–1.00)
GFR, Est AFR Am: >60 mL/min (ref >60)
GFR, Estimated: >60 mL/min (ref >60)
Glucose, Bld: 85 mg/dL (ref 70–99)
Potassium: 4.1 mmol/L (ref 3.5–5.1)
Sodium: 140 mmol/L (ref 135–145)
Total Bilirubin: 0.3 mg/dL (ref 0.3–1.2)
Total Protein: 7.4 g/dL (ref 6.5–8.1)

## 2018-10-09 MED ORDER — HEPARIN SOD (PORK) LOCK FLUSH 100 UNIT/ML IV SOLN
500.0000 [IU] | Freq: Once | INTRAVENOUS | Status: AC | PRN
  Administered 2018-10-09: 500 [IU]
  Filled 2018-10-09: qty 5

## 2018-10-09 MED ORDER — SODIUM CHLORIDE 0.9% FLUSH
10.0000 mL | INTRAVENOUS | Status: DC | PRN
  Administered 2018-10-09: 10 mL
  Filled 2018-10-09: qty 10

## 2018-10-09 MED ORDER — SODIUM CHLORIDE 0.9% FLUSH
10.0000 mL | INTRAVENOUS | Status: DC | PRN
  Administered 2018-10-09: 11:00:00 10 mL
  Filled 2018-10-09: qty 10

## 2018-10-09 NOTE — Assessment & Plan Note (Signed)
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%, PR 0%, HER-2 negative, Ki-67 20%; biopsy of the 4.5 cm mass came back as IDC grade 2-3, ER 50%, PR 5%, HER-2 -2+ ratio 1.6, copy #3.2, Ki-67 20%, T2N1 stage IIb  Recommendation: 1.Neoadjuvant chemotherapy with dose dense Adriamycinand Cytoxan x4 followed by Taxol weekly x12started 05/09/2018 2.followed byBilmastectomy and axillary lymph node dissection 3.Followed by radiation 4.Followed by adjuvant antiestrogen therapy ----------------------------------------------------------- Current treatment: Neoadjuvant dose dense Adriamycin and Cytoxan started11/22/2019, today cycle10Taxol  Chemo toxicities: 1.Dysgeusia: she is managing this well. 2. Neutropenia: we addedGranix to the Saturday prior to her Tuesday treatments.  3.Mild neuropathy: Intermittent. We will continue to watch and monitor.  Breast MRI and surgery follow-up will be set up. WebEx visit to discuss the results of breast MRI

## 2018-10-10 ENCOUNTER — Other Ambulatory Visit: Payer: BLUE CROSS/BLUE SHIELD

## 2018-10-10 ENCOUNTER — Ambulatory Visit: Payer: BLUE CROSS/BLUE SHIELD

## 2018-10-10 ENCOUNTER — Telehealth: Payer: Self-pay | Admitting: *Deleted

## 2018-10-10 NOTE — Telephone Encounter (Signed)
Called to congratulate pt on completion of chemo. Denies questions or needs at this time.

## 2018-10-11 ENCOUNTER — Other Ambulatory Visit: Payer: Self-pay

## 2018-10-11 ENCOUNTER — Telehealth: Payer: Self-pay | Admitting: Hematology and Oncology

## 2018-10-11 ENCOUNTER — Ambulatory Visit
Admission: RE | Admit: 2018-10-11 | Discharge: 2018-10-11 | Disposition: A | Discharge status: Home / Self Care | Payer: BLUE CROSS/BLUE SHIELD | Source: Ambulatory Visit | Attending: Hematology and Oncology | Admitting: Hematology and Oncology

## 2018-10-11 DIAGNOSIS — C50211 Malignant neoplasm of upper-inner quadrant of right female breast: Secondary | ICD-10-CM

## 2018-10-11 DIAGNOSIS — Z170 Estrogen receptor positive status [ER+]: Principal | ICD-10-CM

## 2018-10-11 MED ORDER — GADOBUTROL 1 MMOL/ML IV SOLN
7.0000 mL | Freq: Once | INTRAVENOUS | Status: AC | PRN
  Administered 2018-10-11: 7 mL via INTRAVENOUS

## 2018-10-11 NOTE — Telephone Encounter (Signed)
Left voicemail for patient regarding her Webex appointment. I told her told download the [employer] App onto one of her devices. And sent her join link to [email or web address]. Told her to click the green "join meeting" button and that would populate the appointment details. Would also prompt her to download the app if she hadn't already.   Told her if she was unable to do a video call with Dr. [id number]$, to call Lindi Adie back and we would convert it to a phone call visit.

## 2018-10-13 ENCOUNTER — Ambulatory Visit: Payer: BLUE CROSS/BLUE SHIELD

## 2018-10-14 NOTE — Progress Notes (Signed)
This encounter was created in error - please disregard.

## 2018-10-15 ENCOUNTER — Telehealth: Payer: Self-pay | Admitting: Hematology and Oncology

## 2018-10-15 ENCOUNTER — Inpatient Hospital Stay: Payer: BLUE CROSS/BLUE SHIELD | Admitting: Hematology and Oncology

## 2018-10-15 NOTE — Telephone Encounter (Signed)
Called regarding upcoming Webex appointment, patient is notified and e-mail has been sent.

## 2018-10-16 ENCOUNTER — Encounter: Payer: Self-pay | Admitting: *Deleted

## 2018-10-16 NOTE — Progress Notes (Signed)
HEMATOLOGY-ONCOLOGY [place] VISIT PROGRESS NOTE  I connected with Miranda Ayers on 10/17/2018 at 10:15 AM EDT by Webex video conference and verified that I am speaking with the correct person using two identifiers.  I discussed the limitations, risks, security and privacy concerns of performing an evaluation and management service by Webex and the availability of in person appointments.  I also discussed with the patient that there may be a patient responsible charge related to this service. The patient expressed understanding and agreed to proceed.  Patient's Location: Home Physician Location: Clinic  CHIEF COMPLIANT: Follow-up to discuss results of breast MRI  INTERVAL HISTORY: Miranda Ayers is a 33 y.o. female with above-mentioned history of right breast cancer who completed neoadjuvant chemotherapy on 10/08/18. A breast MRI on 10/11/18 showed significant decrease in the size of the right breast mass and right axillary adenopathy. She presents today over Webex to discuss the results.     Malignant neoplasm of upper-inner quadrant of right breast in female, estrogen receptor positive ([place])   04/17/2018 Initial Diagnosis    Palpable right breast masses UOQ and UIQ with multiple masses 12 to 14 cm, at 1:00 1.6 cm, 1231.8 cm, and o'clock 4.5 cm and at 9:30 position 5 cm: Biopsy revealed IDC with DCIS, grade 3, ER 80%, PR 0%, HER-2 negative, Ki-67 20%; biopsy of the 4.5 cm mass came back as IDC grade 2-3, ER 50%, PR 5%, HER-2 -2+ ratio 1.6, copy #3.2, Ki-67 20%, T2N1 stage IIb    05/09/2018 - 10/08/2018 Neo-Adjuvant Chemotherapy    Neoadjuvant chemotherapy with dose dense Adriamycin and Cytoxan x4 followed by Taxol weekly x12    06/19/2018 Genetic Testing    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  The Multi-Gene Panel offered by [facility] includes sequencing and/or deletion duplication testing of the following 84 genes: AIP, ALK, APC, ATM, AXIN2,BAP1,  BARD1,  BLM, BMPR1A, BRCA1, BRCA2, BRIP1, CASR, CDC73, CDH1, CDK4, CDKN1B, CDKN1C, CDKN2A (p14ARF), CDKN2A (p16INK4a), CEBPA, CHEK2, CTNNA1, DICER1, DIS3L2, EGFR (c.2369C>T, p.Thr790Met variant only), EPCAM (Deletion/duplication testing only), FH, FLCN, GATA2, GPC3, GREM1 (Promoter region deletion/duplication testing only), HOXB13 (c.251G>A, p.Gly84Glu), HRAS, KIT, MAX, MEN1, MET, MITF (c.952G>A, p.Glu318Lys variant only), MLH1, MSH2, MSH3, MSH6, MUTYH, NBN, NF1, NF2, NTHL1, PALB2, PDGFRA, PHOX2B, PMS2, POLD1, POLE, POT1, PRKAR1A, PTCH1, PTEN, RAD50, RAD51C, RAD51D, RB1, RECQL4, RET, RUNX1, SDHAF2, SDHA (sequence changes only), SDHB, SDHC, SDHD, SMAD4, SMARCA4, SMARCB1, SMARCE1, STK11, SUFU, TERC, TERT, TMEM127, TP53, TSC1, TSC2, VHL, WRN and WT1.  The report date is 06/19/2018.    10/11/2018 Breast MRI    Significant decrease in the size of the right axillary adenopathy and right breast masses from 1.2cm to 0.9cm with minimal residual enhancement, and 2.4x1.4cm to 2.3x1.9cm.     REVIEW OF SYSTEMS:   Constitutional: Denies fevers, chills or abnormal weight loss Eyes: Denies blurriness of vision Ears, nose, mouth, throat, and face: Denies mucositis or sore throat Respiratory: Denies cough, dyspnea or wheezes Cardiovascular: Denies palpitation, chest discomfort Gastrointestinal:  Denies nausea, heartburn or change in bowel habits Skin: Denies abnormal skin rashes Lymphatics: Denies new lymphadenopathy or easy bruising Neurological: Mild peripheral neuropathy Behavioral/Psych: Mood is stable, no new changes  Extremities: No lower extremity edema Breast: denies any pain or lumps or nodules in either breasts All other systems were reviewed with the patient and are negative.  Observations/Objective:  There were no vitals filed for this visit. There is no height or weight on file to calculate BMI.  I have reviewed the data as listed CMP Latest Ref Rng & Units 10/09/2018 10/02/2018 09/25/2018  Glucose 70 - 99  mg/dL 85 99 96  BUN 6 - 20 mg/dL 5(L) 6 7  Creatinine 0.44 - 1.00 mg/dL 0.67 0.72 0.73  Sodium 135 - 145 mmol/L 140 140 142  Potassium 3.5 - 5.1 mmol/L 4.1 3.6 3.8  Chloride 98 - 111 mmol/L 106 106 107  CO2 22 - 32 mmol/L 24 23 25   Calcium 8.9 - 10.3 mg/dL 9.7 9.4 9.8  Total Protein 6.5 - 8.1 g/dL 7.4 7.4 7.5  Total Bilirubin 0.3 - 1.2 mg/dL 0.3 0.2(L) 0.3  Alkaline Phos 38 - 126 U/L 68 69 63  AST 15 - 41 U/L 18 15 24   ALT 0 - 44 U/L 18 13 28     Lab Results  Component Value Date   WBC 9.2 10/09/2018   HGB 11.6 (L) 10/09/2018   HCT 36.1 10/09/2018   MCV 96.5 10/09/2018   PLT 270 10/09/2018   NEUTROABS 7.4 10/09/2018      Assessment Plan:  Malignant neoplasm of upper-inner quadrant of right breast in female, estrogen receptor positive ([place]) 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%, PR 0%, HER-2 negative, Ki-67 20%; biopsy of the 4.5 cm mass came back as IDC grade 2-3, ER 50%, PR 5%, HER-2 -2+ ratio 1.6, copy #3.2, Ki-67 20%, T2N1 stage IIb  Recommendation: 1.Neoadjuvant chemotherapy with dose dense Adriamycinand Cytoxan x4 followed by Taxol weekly x11started 05/09/2018-10/02/2018 2.followed byBilmastectomy and axillary lymph node dissection 3.Followed by radiation 4.Followed by adjuvant antiestrogen therapy ----------------------------------------------------------- Current treatment: Neoadjuvant dose dense Adriamycin and Cytoxan started11/22/2019, today cycle10Taxol  Breast MRI: 10/11/2018: Significant decrease in the size of the right axillary adenopathy and right breast masses from 1.2cm to 0.9cm with minimal residual enhancement, and 2.4x1.4cm to 2.3x1.9cm.  Patient will presented in the breast tumor board and she has appointments to see her surgeon. Return to clinic after surgery to discuss the  final pathology report through a WebEx meeting.    I discussed the assessment and treatment plan with the patient. The patient was provided an opportunity to ask questions and all were answered. The patient agreed with the plan and demonstrated an understanding of the instructions. The patient was advised to call back or seek an in-person evaluation if the symptoms worsen or if the condition fails to improve as anticipated.   I provided 15 minutes of face-to-face Web Ex time during this encounter.    Rulon Eisenmenger, MD 10/17/2018   I, Molly Dorshimer, am acting as scribe for [name], MD.  I have reviewed the above documentation for accuracy and completeness, and I agree with the above.

## 2018-10-17 ENCOUNTER — Inpatient Hospital Stay (HOSPITAL_BASED_OUTPATIENT_CLINIC_OR_DEPARTMENT_OTHER): Payer: BLUE CROSS/BLUE SHIELD | Admitting: Hematology and Oncology

## 2018-10-17 ENCOUNTER — Ambulatory Visit: Payer: BLUE CROSS/BLUE SHIELD | Admitting: Hematology and Oncology

## 2018-10-17 ENCOUNTER — Other Ambulatory Visit: Payer: BLUE CROSS/BLUE SHIELD

## 2018-10-17 ENCOUNTER — Ambulatory Visit: Payer: BLUE CROSS/BLUE SHIELD

## 2018-10-17 DIAGNOSIS — Z9221 Personal history of antineoplastic chemotherapy: Secondary | ICD-10-CM | POA: Diagnosis not present

## 2018-10-17 DIAGNOSIS — Z170 Estrogen receptor positive status [ER+]: Secondary | ICD-10-CM

## 2018-10-17 DIAGNOSIS — C50211 Malignant neoplasm of upper-inner quadrant of right female breast: Secondary | ICD-10-CM

## 2018-10-17 NOTE — Assessment & Plan Note (Signed)
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%, PR 0%, HER-2 negative, Ki-67 20%; biopsy of the 4.5 cm mass came back as IDC grade 2-3, ER 50%, PR 5%, HER-2 -2+ ratio 1.6, copy #3.2, Ki-67 20%, T2N1 stage IIb  Recommendation: 1.Neoadjuvant chemotherapy with dose dense Adriamycinand Cytoxan x4 followed by Taxol weekly x11started 05/09/2018-10/02/2018 2.followed byBilmastectomy and axillary lymph node dissection 3.Followed by radiation 4.Followed by adjuvant antiestrogen therapy ----------------------------------------------------------- Current treatment: Neoadjuvant dose dense Adriamycin and Cytoxan started11/22/2019, today cycle10Taxol  Breast MRI: 10/11/2018: Significant decrease in the size of the right axillary adenopathy and right breast masses from 1.2cm to 0.9cm with minimal residual enhancement, and 2.4x1.4cm to 2.3x1.9cm.  Patient will presented in the breast tumor board and she has appointments to see her surgeon. Return to clinic after surgery to discuss the final pathology report through a WebEx meeting.

## 2018-10-24 ENCOUNTER — Other Ambulatory Visit: Payer: BLUE CROSS/BLUE SHIELD

## 2018-10-24 ENCOUNTER — Ambulatory Visit: Payer: BLUE CROSS/BLUE SHIELD

## 2018-10-26 ENCOUNTER — Encounter: Payer: Self-pay | Admitting: Hematology and Oncology

## 2018-10-29 ENCOUNTER — Ambulatory Visit: Payer: Self-pay | Admitting: Surgery

## 2018-10-29 DIAGNOSIS — C50911 Malignant neoplasm of unspecified site of right female breast: Secondary | ICD-10-CM

## 2018-10-29 NOTE — H&P (Signed)
Miranda Ayers Documented: 10/29/2018 2:16 PM Location: [facility] Patient #: [id number] DOB: 14-Aug-1985 Undefined / Language: [name] / Race: Black or African American Female  History of Present Illness Marcello Moores A. Danyelle Brookover MD; 10/29/2018 2:38 PM) Patient words: Patient returns for follow-up of her multifocal right breast cancer. She's completed chemotherapy and disease in the right upper breast and right inner breast are much improved. She is opted for right simple mastectomy and wishes for delayed reconstruction since she will require postoperative radiation therapy. She desires reduction left therefore all this will be done after definitive treatment.      ADDITIONAL INFORMATION: 1. FLUORESCENCE IN-SITU HYBRIDIZATION Results: GROUP 4: HER2 **NEGATIVE** Equivocal form of amplification of the HER2 gene was detected in the IHC 2+ tissue sample received from this individual. HER2 FISH was performed by a technologist and cell imaging and analysis on the BioView. First Analysis: RATIO OF HER2/CEN 17 SIGNALS 1.79 AVERAGE HER2 COPY NUMBER PER CELL 4.65 An additional 20 cells were analyzed by FISH. Additional Analysis: RATIO OF HER2/CEN 17 SIGNALS 1.78 AVERAGE HER2 COPY NUMBER PER CELL 4.45 The ratio of HER2/CEN 17 is within the normal range < 2.0 of HER2/CEN 17 and a copy number of HER2 signals per cell is >=4.0 and < 6.0. Arch Pathol Lab Med 1:1,2018 COMMENT: It is uncertain whether patients with >=4.0 and <6.0 average HER2 signals/cell and HER2/CEN 17 ratio <2.0 benefit from HER2 targeted therapy in the absence of protein overexpression (IHC 3+). If the specimen test result is close to the ISH ratio threshold for positive, there is a high likelihood that repeat testing will result in different results by chance alone. Therefore, when [facility] results are not 3+ positive, it is recommended that the sample be considered HER2 negative without additional testing on the same  specimen. Vicente Males MD Pathologist, Electronic Signature ( Signed 04/20/2018) 1. PROGNOSTIC INDICATORS 1 of 4 FINAL for [place], Jacee MARQUITA [phone]) ADDITIONAL INFORMATION:(continued) Results: IMMUNOHISTOCHEMICAL AND MORPHOMETRIC ANALYSIS PERFORMED MANUALLY The tumor cells are EQUIVOCAL for Her2 (2+). Her2 by FISH will be performed and the results reported separately. Estrogen Receptor: 80%, POSITIVE, STRONG STAINING INTENSITY Progesterone Receptor: 2%, POSITIVE, MODERATE STAINING INTENSITY Proliferation Marker Ki67: 20% REFERENCE RANGE ESTROGEN RECEPTOR NEGATIVE 0% POSITIVE =>1% REFERENCE RANGE PROGESTERONE RECEPTOR NEGATIVE 0% POSITIVE =>1% All controls stained appropriately Vicente Males MD Pathologist, Electronic Signature ( Signed 04/19/2018) 3. FLUORESCENCE IN-SITU HYBRIDIZATION Results: GROUP 5: HER2 **NEGATIVE** Equivocal form of amplification of the HER2 gene was detected in the IHC 2+ tissue sample received from this individual. HER2 FISH was performed by a technologist and cell imaging and analysis on the BioView. RATIO OF HER2/CEN17 SIGNALS 1.60 AVERAGE HER2 COPY NUMBER PER CELL 3.20 The ratio of HER2/CEN 17 is within the range < 2.0 of HER2/CEN 17 and a copy number of HER2 signals per cell is <4.0. Arch Pathol Lab Med 1:1,2018 Vicente Males MD Pathologist, Electronic Signature ( Signed 04/20/2018) 3. Results: IMMUNOHISTOCHEMICAL AND MORPHOMETRIC ANALYSIS PERFORMED MANUALLY The tumor cells are EQUIVOCAL for Her2 (2+). Her2 by FISH will be performed and the results reported separately. Estrogen Receptor: 50%, POSITIVE, STRONG STAINING INTENSITY Progesterone Receptor: 5%, POSITIVE, STRONG STAINING INTENSITY Proliferation Marker Ki67: 20% 2 of 4 FINAL for Sur, Merleen MARQUITA ([phone]) ADDITIONAL INFORMATION:(continued) REFERENCE RANGE ESTROGEN RECEPTOR NEGATIVE 0% POSITIVE =>1% REFERENCE RANGE PROGESTERONE RECEPTOR NEGATIVE 0% POSITIVE  =>1% All controls stained appropriately Vicente Males MD Pathologist, Electronic Signature ( Signed 04/19/2018) FINAL DIAGNOSIS Diagnosis 1. Breast, right, needle core biopsy, 1 o'clock - INVASIVE DUCTAL  CARCINOMA WITH CALCIFICATIONS, SEE COMMENT. - DUCTAL CARCINOMA IN SITU. 2. Lymph node, needle/core biopsy, right axilla - METASTATIC CARCINOMA IN ONE LYMPH NODE. 3. Breast, right, needle core biopsy, lateral calcifications - INVASIVE DUCTAL CARCINOMA WITH CALCIFICATIONS                    Patient with history of right breast cancer status post neoadjuvant chemotherapy.  EXAM: BILATERAL BREAST MRI WITH AND WITHOUT CONTRAST  TECHNIQUE: Multiplanar, multisequence MR images of both breasts were obtained prior to and following the intravenous administration of 7 ml of Gadavist  Three-dimensional MR images were rendered by post-processing of the original MR data on an independent workstation. The three-dimensional MR images were interpreted, and findings are reported in the following complete MRI report for this study. Three dimensional images were evaluated at the independent DynaCad workstation  Creatinine was obtained on site at [place] at [address]  Results: Creatinine 0.7 mg/dL.  COMPARISON: Previous exam(s).  FINDINGS: Breast composition: b. Scattered fibroglandular tissue.  Background parenchymal enhancement: Mild  Right breast: Wiithin the upper inner right breast middle depth there is susceptibility artifact within a mass. Mass is significantly decreased in size measuring 0.9 x 1.0 cm (image 52; series 7), previously 1.2 x 1.7 cm. There is minimal residual enhancement within this mass.  Slightly more inferiorly within the medial right breast is an irregular mass which which measures 2.4 x 1.4 cm (image 72; series 10), previously 2.3 x 1.9 cm at the same level. Overall this mass is decreased in  size.                                Within the outer aspect of the right breast there is susceptibility artifact compatible with biopsy marking clip. Overall when compared to prior exam there has been significant interval improvement and near complete resolution of previously described extensive mass and non mass enhancement throughout the majority of the right breast, predominately involving the outer aspect of the right breast however with involvement involving the medial anterior portion of the right breast.  Left breast: No mass or abnormal enhancement.  Lymph nodes: Interval resolution of previously described right axillary adenopathy. No left axillary adenopathy.  Ancillary findings: None.  IMPRESSION: Significant interval decrease in size of mass and non mass enhancement throughout the right breast compatible with treatment response.  Interval decrease in size of right axillary adenopathy.  RECOMMENDATION: Treatment plan for known right breast malignancy and right axillary metastatic adenopathy  BI-RADS CATEGORY 6: Known biopsy-proven malignancy.   Electronically Signed By: Lovey Newcomer M.D. On: 10/16/2018 13:54.  The patient is a 33 year old female.   Problem List/Past Medical Gabriel Cirri [place], CMA; 10/29/2018 2:17 PM) MALIGNANT NEOPLASM OF OVERLAPPING SITES OF RIGHT BREAST IN FEMALE, ESTROGEN RECEPTOR NEGATIVE (C50.811)  Allergies ([place], CMA; 10/29/2018 2:17 PM) No Known Allergies [10/29/2018]: No Known Drug Allergies [10/29/2018]: Allergies Reconciled  Medication History Nance Pew, CMA; 10/29/2018 2:17 PM) No Current Medications Medications Reconciled    Vitals (Sabrina Canty CMA; 10/29/2018 2:17 PM) 10/29/2018 2:17 PM Weight: 153.6 lb Height: 61in Body Surface Area: 1.69 m Body Mass Index: 29.02 kg/m  Temp.: 97.53F(Oral)  Pulse: 101 (Regular)  BP: 168/98 (Sitting, Left Arm,  Standard)      Physical Exam (Illene Sweeting A. Darnice Comrie MD; 10/29/2018 2:39 PM)  General Mental Status-Alert. General Appearance-Consistent with stated age. Hydration-Well hydrated. Voice-Normal.  Head and Neck Head-normocephalic,  atraumatic with no lesions or palpable masses.  Chest and Lung Exam Chest and lung exam reveals -quiet, even and easy respiratory effort with no use of accessory muscles and on auscultation, normal breath sounds, no adventitious sounds and normal vocal resonance. Inspection Chest Wall - Normal. Back - normal.  Breast Breast - Left-Symmetric, Non Tender, No Biopsy scars, no Dimpling, No Inflammation, No Lumpectomy scars, No Mastectomy scars, No Peau d' Orange. Breast - Right-Symmetric, Non Tender, No Biopsy scars, no Dimpling, No Inflammation, No Lumpectomy scars, No Mastectomy scars, No Peau d' Orange. Breast Lump-No Palpable Breast Mass.  Neurologic Neurologic evaluation reveals -alert and oriented x 3 with no impairment of recent or remote memory. Mental Status-Normal.  Lymphatic Head & Neck  General Head & Neck Lymphatics: Bilateral - Description - Normal. Axillary  General Axillary Region: Bilateral - Description - Normal. Tenderness - Non Tender.    Assessment & Plan (Elester Apodaca A. 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 Given her large tumor size, she will require postmastectomy radiation therapy more than likely once delay reconstruction so that she may also get a breast reduction on the left. She has seen plastic surgery to discuss this. We will proceed with right simple mastectomy targeted right axillary lymph node biopsy and right axillary sentinel lymph node mapping. Discussed treatment options for breast cancer to include breast  conservation vs mastectomy with reconstruction. Pt has decided on mastectomy. Risk include bleeding, infection, flap necrosis, pain, numbness, recurrence, hematoma, other surgery needs. Pt understands and agrees to proceed. Risk of sentinel lymph node mapping include bleeding, infection, lymphedema, shoulder pain. stiffness, dye allergy. cosmetic deformity , blood clots, death, need for more surgery. Pt agres to proceed.  Current Plans You are being scheduled for surgery- Our schedulers will call you.  You should hear from our office's scheduling department within 5 working days about the location, date, and time of surgery. We try to make accommodations for patient's preferences in scheduling surgery, but sometimes the OR schedule or the surgeon's schedule prevents [place] from making those accommodations.  If you have not heard from our office [phone]) in 5 working days, call the office and ask for your surgeon's nurse.  If you have other questions about your diagnosis, plan, or surgery, call the office and ask for your surgeon's nurse.  Pt Education - CCS Mastectomy HCI The anatomy and the physiology was discussed. The pathophysiology and natural history of the disease was discussed. Options were discussed and recommendations were made. Technique, risks, benefits, & alternatives were discussed. Risks such as stroke, heart attack, bleeding, indection, death, and other risks discussed. Discussed flap necrosis revisional surgery lymphedema chronic shoulder pain chronic pain of the operative site and any further treatments and/or therapies Questions answered. The patient agrees to proceed.

## 2018-10-31 ENCOUNTER — Ambulatory Visit: Payer: BLUE CROSS/BLUE SHIELD

## 2018-10-31 ENCOUNTER — Other Ambulatory Visit: Payer: Self-pay | Admitting: Surgery

## 2018-10-31 ENCOUNTER — Other Ambulatory Visit: Payer: Self-pay | Admitting: *Deleted

## 2018-10-31 ENCOUNTER — Other Ambulatory Visit: Payer: BLUE CROSS/BLUE SHIELD

## 2018-10-31 ENCOUNTER — Ambulatory Visit: Payer: BLUE CROSS/BLUE SHIELD | Admitting: Hematology and Oncology

## 2018-10-31 DIAGNOSIS — C50211 Malignant neoplasm of upper-inner quadrant of right female breast: Secondary | ICD-10-CM

## 2018-10-31 DIAGNOSIS — C50911 Malignant neoplasm of unspecified site of right female breast: Secondary | ICD-10-CM

## 2018-10-31 DIAGNOSIS — Z170 Estrogen receptor positive status [ER+]: Secondary | ICD-10-CM

## 2018-11-02 ENCOUNTER — Telehealth: Payer: Self-pay | Admitting: Hematology and Oncology

## 2018-11-02 NOTE — Telephone Encounter (Signed)
Confirmed w/patient 6/18 f/u.

## 2018-11-09 ENCOUNTER — Other Ambulatory Visit: Payer: Self-pay | Admitting: Surgery

## 2018-11-09 DIAGNOSIS — C50911 Malignant neoplasm of unspecified site of right female breast: Secondary | ICD-10-CM

## 2018-11-13 ENCOUNTER — Other Ambulatory Visit: Payer: Self-pay

## 2018-11-13 ENCOUNTER — Encounter (HOSPITAL_BASED_OUTPATIENT_CLINIC_OR_DEPARTMENT_OTHER): Payer: Self-pay | Admitting: *Deleted

## 2018-11-16 ENCOUNTER — Other Ambulatory Visit (HOSPITAL_COMMUNITY)
Admission: RE | Admit: 2018-11-16 | Discharge: 2018-11-16 | Disposition: A | Discharge status: Home / Self Care | Payer: BC Managed Care – PPO | Source: Ambulatory Visit | Attending: Surgery | Admitting: Surgery

## 2018-11-16 ENCOUNTER — Other Ambulatory Visit: Payer: Self-pay

## 2018-11-16 ENCOUNTER — Encounter (HOSPITAL_BASED_OUTPATIENT_CLINIC_OR_DEPARTMENT_OTHER)
Admission: RE | Admit: 2018-11-16 | Discharge: 2018-11-16 | Disposition: A | Discharge status: Home / Self Care | Payer: BC Managed Care – PPO | Source: Ambulatory Visit | Attending: Surgery | Admitting: Surgery

## 2018-11-16 DIAGNOSIS — C50911 Malignant neoplasm of unspecified site of right female breast: Secondary | ICD-10-CM | POA: Diagnosis present

## 2018-11-16 DIAGNOSIS — Z1159 Encounter for screening for other viral diseases: Secondary | ICD-10-CM | POA: Diagnosis not present

## 2018-11-16 DIAGNOSIS — Z791 Long term (current) use of non-steroidal anti-inflammatories (NSAID): Secondary | ICD-10-CM | POA: Diagnosis not present

## 2018-11-16 DIAGNOSIS — Z170 Estrogen receptor positive status [ER+]: Secondary | ICD-10-CM | POA: Diagnosis not present

## 2018-11-16 DIAGNOSIS — C50811 Malignant neoplasm of overlapping sites of right female breast: Secondary | ICD-10-CM | POA: Diagnosis not present

## 2018-11-16 DIAGNOSIS — I10 Essential (primary) hypertension: Secondary | ICD-10-CM | POA: Diagnosis not present

## 2018-11-16 DIAGNOSIS — Z79899 Other long term (current) drug therapy: Secondary | ICD-10-CM | POA: Diagnosis not present

## 2018-11-16 LAB — CBC WITH DIFFERENTIAL/PLATELET
Abs Immature Granulocytes: 0 10*3/uL (ref 0.00–0.07)
Basophils Absolute: 0 10*3/uL (ref 0.0–0.1)
Basophils Relative: 1 %
Eosinophils Absolute: 0.1 10*3/uL (ref 0.0–0.5)
Eosinophils Relative: 3 %
HCT: 41.3 % (ref 36.0–46.0)
Hemoglobin: 13.4 g/dL (ref 12.0–15.0)
Immature Granulocytes: 0 %
Lymphocytes Relative: 34 %
Lymphs Abs: 0.9 10*3/uL (ref 0.7–4.0)
MCH: 28.9 pg (ref 26.0–34.0)
MCHC: 32.4 g/dL (ref 30.0–36.0)
MCV: 89.2 fL (ref 80.0–100.0)
Monocytes Absolute: 0.4 10*3/uL (ref 0.1–1.0)
Monocytes Relative: 16 %
Neutro Abs: 1.2 10*3/uL — ABNORMAL LOW (ref 1.7–7.7)
Neutrophils Relative %: 46 %
Platelets: 207 10*3/uL (ref 150–400)
RBC: 4.63 MIL/uL (ref 3.87–5.11)
RDW: 12.9 % (ref 11.5–15.5)
WBC: 2.7 10*3/uL — ABNORMAL LOW (ref 4.0–10.5)
nRBC: 0 % (ref 0.0–0.2)

## 2018-11-16 LAB — COMPREHENSIVE METABOLIC PANEL
ALT: 19 U/L (ref 0–44)
AST: 21 U/L (ref 15–41)
Albumin: 4.3 g/dL (ref 3.5–5.0)
Alkaline Phosphatase: 60 U/L (ref 38–126)
Anion gap: 8 (ref 5–15)
BUN: 7 mg/dL (ref 6–20)
CO2: 28 mmol/L (ref 22–32)
Calcium: 10.2 mg/dL (ref 8.9–10.3)
Chloride: 105 mmol/L (ref 98–111)
Creatinine, Ser: 0.74 mg/dL (ref 0.44–1.00)
GFR calc Af Amer: >60 mL/min (ref >60)
GFR calc non Af Amer: >60 mL/min (ref >60)
Glucose, Bld: 82 mg/dL (ref 70–99)
Potassium: 4.8 mmol/L (ref 3.5–5.1)
Sodium: 141 mmol/L (ref 135–145)
Total Bilirubin: 0.8 mg/dL (ref 0.3–1.2)
Total Protein: 7.6 g/dL (ref 6.5–8.1)

## 2018-11-16 NOTE — Progress Notes (Addendum)
Ensure pre surgery drink given with instructions to complete by [facility], surgical soap given with instructions, pt verbalized understanding.

## 2018-11-17 LAB — NOVEL CORONAVIRUS, NAA (HOSP ORDER, SEND-OUT TO REF LAB; TAT 18-24 HRS): SARS-CoV-2, NAA: NOT DETECTED

## 2018-11-19 ENCOUNTER — Ambulatory Visit
Admission: RE | Admit: 2018-11-19 | Discharge: 2018-11-19 | Disposition: A | Discharge status: Home / Self Care | Payer: BLUE CROSS/BLUE SHIELD | Source: Ambulatory Visit | Attending: Surgery | Admitting: Surgery

## 2018-11-19 ENCOUNTER — Other Ambulatory Visit: Payer: Self-pay | Admitting: Surgery

## 2018-11-19 ENCOUNTER — Other Ambulatory Visit: Payer: Self-pay

## 2018-11-19 DIAGNOSIS — C50911 Malignant neoplasm of unspecified site of right female breast: Secondary | ICD-10-CM

## 2018-11-20 ENCOUNTER — Ambulatory Visit (HOSPITAL_BASED_OUTPATIENT_CLINIC_OR_DEPARTMENT_OTHER)
Admission: RE | Admit: 2018-11-20 | Discharge: 2018-11-21 | Disposition: A | Discharge status: Home / Self Care | Payer: BC Managed Care – PPO | Attending: Surgery | Admitting: Surgery

## 2018-11-20 ENCOUNTER — Ambulatory Visit
Admission: RE | Admit: 2018-11-20 | Discharge: 2018-11-20 | Disposition: A | Discharge status: Home / Self Care | Payer: BLUE CROSS/BLUE SHIELD | Source: Ambulatory Visit | Attending: Surgery | Admitting: Surgery

## 2018-11-20 ENCOUNTER — Other Ambulatory Visit: Payer: Self-pay

## 2018-11-20 ENCOUNTER — Ambulatory Visit (HOSPITAL_BASED_OUTPATIENT_CLINIC_OR_DEPARTMENT_OTHER): Payer: BC Managed Care – PPO | Admitting: Anesthesiology

## 2018-11-20 ENCOUNTER — Encounter (HOSPITAL_BASED_OUTPATIENT_CLINIC_OR_DEPARTMENT_OTHER)
Admission: RE | Disposition: A | Discharge status: Home / Self Care | Payer: Self-pay | Source: Home / Self Care | Attending: Surgery

## 2018-11-20 ENCOUNTER — Encounter (HOSPITAL_BASED_OUTPATIENT_CLINIC_OR_DEPARTMENT_OTHER): Payer: Self-pay | Admitting: Anesthesiology

## 2018-11-20 ENCOUNTER — Encounter (HOSPITAL_COMMUNITY)
Admission: RE | Admit: 2018-11-20 | Discharge: 2018-11-20 | Disposition: A | Discharge status: Home / Self Care | Payer: BC Managed Care – PPO | Source: Ambulatory Visit | Attending: Surgery | Admitting: Surgery

## 2018-11-20 DIAGNOSIS — C50811 Malignant neoplasm of overlapping sites of right female breast: Secondary | ICD-10-CM | POA: Insufficient documentation

## 2018-11-20 DIAGNOSIS — Z79899 Other long term (current) drug therapy: Secondary | ICD-10-CM | POA: Insufficient documentation

## 2018-11-20 DIAGNOSIS — I10 Essential (primary) hypertension: Secondary | ICD-10-CM | POA: Insufficient documentation

## 2018-11-20 DIAGNOSIS — Z170 Estrogen receptor positive status [ER+]: Secondary | ICD-10-CM | POA: Insufficient documentation

## 2018-11-20 DIAGNOSIS — C50911 Malignant neoplasm of unspecified site of right female breast: Secondary | ICD-10-CM | POA: Insufficient documentation

## 2018-11-20 DIAGNOSIS — Z791 Long term (current) use of non-steroidal anti-inflammatories (NSAID): Secondary | ICD-10-CM | POA: Insufficient documentation

## 2018-11-20 HISTORY — PX: MASTECTOMY WITH RADIOACTIVE SEED GUIDED EXCISION AND AXILLARY SENTINEL LYMPH NODE BIOPSY: SHX6736

## 2018-11-20 SURGERY — MASTECTOMY WITH RADIOACTIVE SEED GUIDED EXCISION AND AXILLARY SENTINEL LYMPH NODE BIOPSY
Anesthesia: General | Site: Breast | Laterality: Right

## 2018-11-20 MED ORDER — ACETAMINOPHEN 500 MG PO TABS
ORAL_TABLET | ORAL | Status: AC
  Filled 2018-11-20: qty 2

## 2018-11-20 MED ORDER — GABAPENTIN 300 MG PO CAPS
ORAL_CAPSULE | ORAL | Status: AC
  Filled 2018-11-20: qty 1

## 2018-11-20 MED ORDER — FENTANYL CITRATE (PF) 100 MCG/2ML IJ SOLN
INTRAMUSCULAR | Status: DC | PRN
  Administered 2018-11-20: 25 ug via INTRAVENOUS
  Administered 2018-11-20: 50 ug via INTRAVENOUS
  Administered 2018-11-20: 25 ug via INTRAVENOUS

## 2018-11-20 MED ORDER — MIDAZOLAM HCL 2 MG/2ML IJ SOLN
INTRAMUSCULAR | Status: AC
  Filled 2018-11-20: qty 2

## 2018-11-20 MED ORDER — DEXAMETHASONE SODIUM PHOSPHATE 10 MG/ML IJ SOLN
INTRAMUSCULAR | Status: AC
  Filled 2018-11-20: qty 1

## 2018-11-20 MED ORDER — MIDAZOLAM HCL 5 MG/5ML IJ SOLN
INTRAMUSCULAR | Status: DC | PRN
  Administered 2018-11-20: 2 mg via INTRAVENOUS

## 2018-11-20 MED ORDER — ACETAMINOPHEN 325 MG PO TABS: 650.0000 mg | ORAL_TABLET | ORAL | Status: DC | PRN

## 2018-11-20 MED ORDER — IBUPROFEN 800 MG PO TABS: 800.0000 mg | ORAL_TABLET | Freq: Three times a day (TID) | ORAL | 0 refills | Status: DC | PRN

## 2018-11-20 MED ORDER — GABAPENTIN 300 MG PO CAPS
300.0000 mg | ORAL_CAPSULE | ORAL | Status: AC
  Administered 2018-11-20: 300 mg via ORAL

## 2018-11-20 MED ORDER — SODIUM CHLORIDE (PF) 0.9 % IJ SOLN
INTRAVENOUS | Status: DC | PRN
  Administered 2018-11-20: 5 mL via INTRAMUSCULAR

## 2018-11-20 MED ORDER — SODIUM CHLORIDE 0.9% FLUSH: 3.0000 mL | Freq: Two times a day (BID) | INTRAVENOUS | Status: DC

## 2018-11-20 MED ORDER — MORPHINE SULFATE (PF) 4 MG/ML IV SOLN: 1.0000 mg | INTRAVENOUS | Status: DC | PRN

## 2018-11-20 MED ORDER — OXYCODONE HCL 5 MG PO TABS: 5.0000 mg | ORAL_TABLET | ORAL | Status: DC | PRN

## 2018-11-20 MED ORDER — ACETAMINOPHEN 500 MG PO TABS
1000.0000 mg | ORAL_TABLET | ORAL | Status: AC
  Administered 2018-11-20: 1000 mg via ORAL

## 2018-11-20 MED ORDER — FENTANYL CITRATE (PF) 100 MCG/2ML IJ SOLN
INTRAMUSCULAR | Status: AC
  Filled 2018-11-20: qty 2

## 2018-11-20 MED ORDER — PROPOFOL 10 MG/ML IV BOLUS
INTRAVENOUS | Status: AC
  Filled 2018-11-20: qty 20

## 2018-11-20 MED ORDER — EPHEDRINE SULFATE 50 MG/ML IJ SOLN
INTRAMUSCULAR | Status: DC | PRN
  Administered 2018-11-20: 25 mg via INTRAVENOUS

## 2018-11-20 MED ORDER — LIDOCAINE 2% (20 MG/ML) 5 ML SYRINGE
INTRAMUSCULAR | Status: AC
  Filled 2018-11-20: qty 5

## 2018-11-20 MED ORDER — CHLORHEXIDINE GLUCONATE CLOTH 2 % EX PADS: 6.0000 | MEDICATED_PAD | Freq: Once | CUTANEOUS | Status: DC

## 2018-11-20 MED ORDER — LACTATED RINGERS IV SOLN
INTRAVENOUS | Status: DC
  Administered 2018-11-20 (×2): via INTRAVENOUS

## 2018-11-20 MED ORDER — OXYCODONE HCL 5 MG PO TABS: 5.0000 mg | ORAL_TABLET | Freq: Four times a day (QID) | ORAL | 0 refills | Status: DC | PRN

## 2018-11-20 MED ORDER — DEXTROSE-NACL 5-0.9 % IV SOLN
INTRAVENOUS | Status: DC
  Administered 2018-11-20: 14:00:00 via INTRAVENOUS

## 2018-11-20 MED ORDER — FENTANYL CITRATE (PF) 100 MCG/2ML IJ SOLN
50.0000 ug | INTRAMUSCULAR | Status: DC | PRN
  Administered 2018-11-20: 50 ug via INTRAVENOUS

## 2018-11-20 MED ORDER — SODIUM CHLORIDE 0.9 % IV SOLN: 250.0000 mL | INTRAVENOUS | Status: DC | PRN

## 2018-11-20 MED ORDER — SODIUM CHLORIDE 0.9% FLUSH: 3.0000 mL | INTRAVENOUS | Status: DC | PRN

## 2018-11-20 MED ORDER — LIDOCAINE 2% (20 MG/ML) 5 ML SYRINGE
INTRAMUSCULAR | Status: DC | PRN
  Administered 2018-11-20: 100 mg via INTRAVENOUS

## 2018-11-20 MED ORDER — SCOPOLAMINE 1 MG/3DAYS TD PT72: 1.0000 | MEDICATED_PATCH | Freq: Once | TRANSDERMAL | Status: DC | PRN

## 2018-11-20 MED ORDER — CEFAZOLIN SODIUM-DEXTROSE 2-4 GM/100ML-% IV SOLN
2.0000 g | INTRAVENOUS | Status: AC
  Administered 2018-11-20: 2 g via INTRAVENOUS

## 2018-11-20 MED ORDER — ONDANSETRON HCL 4 MG/2ML IJ SOLN
INTRAMUSCULAR | Status: AC
  Filled 2018-11-20: qty 2

## 2018-11-20 MED ORDER — KETOROLAC TROMETHAMINE 30 MG/ML IJ SOLN
30.0000 mg | Freq: Four times a day (QID) | INTRAMUSCULAR | Status: DC
  Administered 2018-11-20 – 2018-11-21 (×3): 30 mg via INTRAVENOUS
  Filled 2018-11-20 (×3): qty 1

## 2018-11-20 MED ORDER — FENTANYL CITRATE (PF) 100 MCG/2ML IJ SOLN
25.0000 ug | INTRAMUSCULAR | Status: DC | PRN
  Administered 2018-11-20 (×2): 50 ug via INTRAVENOUS

## 2018-11-20 MED ORDER — DEXAMETHASONE SODIUM PHOSPHATE 4 MG/ML IJ SOLN
INTRAMUSCULAR | Status: DC | PRN
  Administered 2018-11-20: 10 mg via INTRAVENOUS

## 2018-11-20 MED ORDER — MIDAZOLAM HCL 2 MG/2ML IJ SOLN
1.0000 mg | INTRAMUSCULAR | Status: DC | PRN
  Administered 2018-11-20: 2 mg via INTRAVENOUS

## 2018-11-20 MED ORDER — CEFAZOLIN SODIUM-DEXTROSE 2-4 GM/100ML-% IV SOLN
INTRAVENOUS | Status: AC
  Filled 2018-11-20: qty 100

## 2018-11-20 MED ORDER — ONDANSETRON HCL 4 MG/2ML IJ SOLN
INTRAMUSCULAR | Status: DC | PRN
  Administered 2018-11-20: 4 mg via INTRAVENOUS

## 2018-11-20 MED ORDER — TECHNETIUM TC 99M SULFUR COLLOID FILTERED
1.0000 | Freq: Once | INTRAVENOUS | Status: AC | PRN
  Administered 2018-11-20: 1 via INTRADERMAL

## 2018-11-20 MED ORDER — PROPOFOL 10 MG/ML IV BOLUS
INTRAVENOUS | Status: DC | PRN
  Administered 2018-11-20: 200 mg via INTRAVENOUS

## 2018-11-20 MED ORDER — PHENYLEPHRINE HCL (PRESSORS) 10 MG/ML IV SOLN
INTRAVENOUS | Status: DC | PRN
  Administered 2018-11-20 (×5): 80 ug via INTRAVENOUS

## 2018-11-20 MED ORDER — HYDROCHLOROTHIAZIDE 12.5 MG PO TABS: 12.5000 mg | ORAL_TABLET | Freq: Every day | ORAL | Status: DC

## 2018-11-20 MED ORDER — ONDANSETRON HCL 4 MG/2ML IJ SOLN: 4.0000 mg | Freq: Once | INTRAMUSCULAR | Status: DC

## 2018-11-20 MED ORDER — ACETAMINOPHEN 650 MG RE SUPP: 650.0000 mg | RECTAL | Status: DC | PRN

## 2018-11-20 SURGICAL SUPPLY — 70 items
APPLIER CLIP 11 MED OPEN (CLIP)
APPLIER CLIP 9.375 MED OPEN (MISCELLANEOUS) ×3
BENZOIN TINCTURE PRP APPL 2/3 (GAUZE/BANDAGES/DRESSINGS) IMPLANT
BINDER BREAST LRG (GAUZE/BANDAGES/DRESSINGS) IMPLANT
BINDER BREAST MEDIUM (GAUZE/BANDAGES/DRESSINGS) IMPLANT
BINDER BREAST XLRG (GAUZE/BANDAGES/DRESSINGS) ×3 IMPLANT
BINDER BREAST XXLRG (GAUZE/BANDAGES/DRESSINGS) IMPLANT
BLADE HEX COATED 2.75 (ELECTRODE) ×3 IMPLANT
BLADE SURG 10 STRL SS (BLADE) ×3 IMPLANT
BLADE SURG 15 STRL LF DISP TIS (BLADE) ×1 IMPLANT
BLADE SURG 15 STRL SS (BLADE) ×2
CANISTER SUCT 1200ML W/VALVE (MISCELLANEOUS) ×3 IMPLANT
CHLORAPREP W/TINT 26 (MISCELLANEOUS) ×3 IMPLANT
CLIP APPLIE 11 MED OPEN (CLIP) IMPLANT
CLIP APPLIE 9.375 MED OPEN (MISCELLANEOUS) ×1 IMPLANT
CLOSURE WOUND 1/2 X4 (GAUZE/BANDAGES/DRESSINGS)
COVER BACK TABLE REUSABLE LG (DRAPES) ×3 IMPLANT
COVER MAYO STAND REUSABLE (DRAPES) ×3 IMPLANT
COVER PROBE W GEL 5X96 (DRAPES) ×3 IMPLANT
COVER WAND RF STERILE (DRAPES) IMPLANT
DECANTER SPIKE VIAL GLASS SM (MISCELLANEOUS) IMPLANT
DERMABOND ADVANCED (GAUZE/BANDAGES/DRESSINGS) ×4
DERMABOND ADVANCED .7 DNX12 (GAUZE/BANDAGES/DRESSINGS) ×2 IMPLANT
DRAIN CHANNEL 19F RND (DRAIN) ×3 IMPLANT
DRAPE LAPAROSCOPIC ABDOMINAL (DRAPES) ×3 IMPLANT
DRAPE UTILITY XL STRL (DRAPES) ×3 IMPLANT
DRSG PAD ABDOMINAL 8X10 ST (GAUZE/BANDAGES/DRESSINGS) ×6 IMPLANT
DRSG TEGADERM 4X10 (GAUZE/BANDAGES/DRESSINGS) IMPLANT
ELECT REM PT RETURN 9FT ADLT (ELECTROSURGICAL) ×3
ELECTRODE REM PT RTRN 9FT ADLT (ELECTROSURGICAL) ×1 IMPLANT
EVACUATOR SILICONE 100CC (DRAIN) ×3 IMPLANT
GAUZE SPONGE 4X4 12PLY STRL LF (GAUZE/BANDAGES/DRESSINGS) ×3 IMPLANT
GLOVE BIO SURGEON STRL SZ 6.5 (GLOVE) ×2 IMPLANT
GLOVE BIO SURGEONS STRL SZ 6.5 (GLOVE) ×1
GLOVE BIOGEL PI IND STRL 8 (GLOVE) ×1 IMPLANT
GLOVE BIOGEL PI INDICATOR 8 (GLOVE) ×2
GLOVE ECLIPSE 8.0 STRL XLNG CF (GLOVE) ×3 IMPLANT
GLOVE EXAM NITRILE MD LF STRL (GLOVE) ×3 IMPLANT
GOWN STRL REUS W/ TWL LRG LVL3 (GOWN DISPOSABLE) ×2 IMPLANT
GOWN STRL REUS W/TWL LRG LVL3 (GOWN DISPOSABLE) ×4
LIGHT WAVEGUIDE WIDE FLAT (MISCELLANEOUS) IMPLANT
NDL SAFETY ECLIPSE 18X1.5 (NEEDLE) IMPLANT
NEEDLE HYPO 18GX1.5 SHARP (NEEDLE)
NEEDLE HYPO 25X1 1.5 SAFETY (NEEDLE) ×6 IMPLANT
NS IRRIG 1000ML POUR BTL (IV SOLUTION) ×3 IMPLANT
PACK BASIN DAY SURGERY FS (CUSTOM PROCEDURE TRAY) ×3 IMPLANT
PENCIL BUTTON HOLSTER BLD 10FT (ELECTRODE) ×3 IMPLANT
PIN SAFETY STERILE (MISCELLANEOUS) ×3 IMPLANT
SLEEVE SCD COMPRESS KNEE MED (MISCELLANEOUS) ×3 IMPLANT
SPONGE LAP 18X18 RF (DISPOSABLE) ×6 IMPLANT
SPONGE LAP 4X18 RFD (DISPOSABLE) ×3 IMPLANT
STAPLER VISISTAT 35W (STAPLE) IMPLANT
STRIP CLOSURE SKIN 1/2X4 (GAUZE/BANDAGES/DRESSINGS) IMPLANT
SUT CHROMIC 3 0 SH 27 (SUTURE) IMPLANT
SUT ETHILON 2 0 FS 18 (SUTURE) ×6 IMPLANT
SUT MNCRL AB 3-0 PS2 18 (SUTURE) ×3 IMPLANT
SUT MNCRL AB 4-0 PS2 18 (SUTURE) ×3 IMPLANT
SUT MON AB 4-0 PC3 18 (SUTURE) ×3 IMPLANT
SUT SILK 2 0 PERMA HAND 18 BK (SUTURE) ×3 IMPLANT
SUT SILK 2 0 SH (SUTURE) IMPLANT
SUT VIC AB 2-0 SH 27 (SUTURE) ×2
SUT VIC AB 2-0 SH 27XBRD (SUTURE) ×1 IMPLANT
SUT VIC AB 3-0 54X BRD REEL (SUTURE) ×1 IMPLANT
SUT VIC AB 3-0 BRD 54 (SUTURE) ×2
SUT VICRYL 3-0 CR8 SH (SUTURE) ×6 IMPLANT
SYR CONTROL 10ML LL (SYRINGE) ×6 IMPLANT
TOWEL GREEN STERILE FF (TOWEL DISPOSABLE) ×6 IMPLANT
TUBE CONNECTING 20'X1/4 (TUBING) ×1
TUBE CONNECTING 20X1/4 (TUBING) ×2 IMPLANT
YANKAUER SUCT BULB TIP NO VENT (SUCTIONS) ×3 IMPLANT

## 2018-11-20 NOTE — Progress Notes (Signed)
Assisted Dr. Green with right, ultrasound guided, pectoralis block. Side rails up, monitors on throughout procedure. See vital signs in flow sheet. Tolerated Procedure well. 

## 2018-11-20 NOTE — Anesthesia Preprocedure Evaluation (Signed)
Anesthesia Evaluation  Patient identified by MRN, date of birth, ID band Patient awake    Reviewed: Allergy & Precautions, NPO status , Patient's Chart, lab work & pertinent test results  Airway Mallampati: II  TM Distance: >3 FB     Dental   Pulmonary neg pulmonary ROS,    breath sounds clear to auscultation       Cardiovascular hypertension,  Rhythm:Regular Rate:Normal     Neuro/Psych    GI/Hepatic negative GI ROS, Neg liver ROS,   Endo/Other  negative endocrine ROS  Renal/GU negative Renal ROS     Musculoskeletal   Abdominal   Peds  Hematology   Anesthesia Other Findings   Reproductive/Obstetrics                             Anesthesia Physical Anesthesia Plan  ASA: III  Anesthesia Plan: General   Post-op Pain Management: GA combined w/ Regional for post-op pain   Induction: Intravenous  PONV Risk Score and Plan: 3 and Ondansetron, Dexamethasone and Midazolam  Airway Management Planned: Oral ETT  Additional Equipment:   Intra-op Plan:   Post-operative Plan: Extubation in OR  Informed Consent: I have reviewed the patients History and Physical, chart, labs and discussed the procedure including the risks, benefits and alternatives for the proposed anesthesia with the patient or authorized representative who has indicated his/her understanding and acceptance.     Dental advisory given  Plan Discussed with: CRNA and Anesthesiologist  Anesthesia Plan Comments:         Anesthesia Quick Evaluation

## 2018-11-20 NOTE — Anesthesia Procedure Notes (Signed)
Procedure Name: LMA Insertion Date/Time: 11/20/2018 10:21 AM Performed by: Marrianne Mood, CRNA Pre-anesthesia Checklist: Patient identified, Emergency Drugs available, Suction available and Patient being monitored Patient Re-evaluated:Patient Re-evaluated prior to induction Oxygen Delivery Method: Circle system utilized Preoxygenation: Pre-oxygenation with 100% oxygen Induction Type: IV induction Ventilation: Mask ventilation without difficulty LMA: LMA inserted LMA Size: 4.0 Number of attempts: 1 Airway Equipment and Method: Bite block Placement Confirmation: positive ETCO2 Tube secured with: Tape Dental Injury: Teeth and Oropharynx as per pre-operative assessment

## 2018-11-20 NOTE — Interval H&P Note (Signed)
History and Physical Interval Note:  11/20/2018 9:57 AM  Miranda Ayers  has presented today for surgery, with the diagnosis of RIGHT BREAST CANCER.  The various methods of treatment have been discussed with the patient and family. After consideration of risks, benefits and other options for treatment, the patient has consented to  Procedure(s): RIGHT SIMPLE MASTECTOMY WITH RIGHT RADIOACTIVE SEED TARGETED LYMPH NODE BIOPSY AND RIGHT  AXILLARY SENTINEL LYMPH NODE MAPPING (Right) as a surgical intervention.  The patient's history has been reviewed, patient examined, no change in status, stable for surgery.  I have reviewed the patient's chart and labs.  Questions were answered to the patient's satisfaction.     [place]

## 2018-11-20 NOTE — H&P (Signed)
Miranda Ayers Documented: 10/29/2018 2:16 PM Location: [facility] Patient #: [id number] DOB: 10/22/1985 Undefined / Language: [name] / Race: Black or African American Female  History of Present Illness Marcello Moores A. Cailan Antonucci MD; 10/29/2018 2:38 PM) Patient words: Patient returns for follow-up of her multifocal right breast cancer. She's completed chemotherapy and disease in the right upper breast and right inner breast are much improved. She is opted for right simple mastectomy and wishes for delayed reconstruction since she will require postoperative radiation therapy. She desires reduction left therefore all this will be done after definitive treatment.      ADDITIONAL INFORMATION: 1. FLUORESCENCE IN-SITU HYBRIDIZATION Results: GROUP 4: HER2 **NEGATIVE** Equivocal form of amplification of the HER2 gene was detected in the IHC 2+ tissue sample received from this individual. HER2 FISH was performed by a technologist and cell imaging and analysis on the BioView. First Analysis: RATIO OF HER2/CEN 17 SIGNALS 1.79 AVERAGE HER2 COPY NUMBER PER CELL 4.65 An additional 20 cells were analyzed by FISH. Additional Analysis: RATIO OF HER2/CEN 17 SIGNALS 1.78 AVERAGE HER2 COPY NUMBER PER CELL 4.45 The ratio of HER2/CEN 17 is within the normal range < 2.0 of HER2/CEN 17 and a copy number of HER2 signals per cell is >=4.0 and < 6.0. Arch Pathol Lab Med 1:1,2018 COMMENT: It is uncertain whether patients with >=4.0 and <6.0 average HER2 signals/cell and HER2/CEN 17 ratio <2.0 benefit from HER2 targeted therapy in the absence of protein overexpression (IHC 3+). If the specimen test result is close to the ISH ratio threshold for positive, there is a high likelihood that repeat testing will result in different results by chance alone. Therefore, when [facility] results are not 3+ positive, it is recommended that the sample be considered HER2 negative without additional testing on the same  specimen. Vicente Males MD Pathologist, Electronic Signature ( Signed 04/20/2018) 1. PROGNOSTIC INDICATORS 1 of 4 FINAL for Noguchi, Miranda Ayers [phone]) ADDITIONAL INFORMATION:(continued) Results: IMMUNOHISTOCHEMICAL AND MORPHOMETRIC ANALYSIS PERFORMED MANUALLY The tumor cells are EQUIVOCAL for Her2 (2+). Her2 by FISH will be performed and the results reported separately. Estrogen Receptor: 80%, POSITIVE, STRONG STAINING INTENSITY Progesterone Receptor: 2%, POSITIVE, MODERATE STAINING INTENSITY Proliferation Marker Ki67: 20% REFERENCE RANGE ESTROGEN RECEPTOR NEGATIVE 0% POSITIVE =>1% REFERENCE RANGE PROGESTERONE RECEPTOR NEGATIVE 0% POSITIVE =>1% All controls stained appropriately Vicente Males MD Pathologist, Electronic Signature ( Signed 04/19/2018) 3. FLUORESCENCE IN-SITU HYBRIDIZATION Results: GROUP 5: HER2 **NEGATIVE** Equivocal form of amplification of the HER2 gene was detected in the IHC 2+ tissue sample received from this individual. HER2 FISH was performed by a technologist and cell imaging and analysis on the BioView. RATIO OF HER2/CEN17 SIGNALS 1.60 AVERAGE HER2 COPY NUMBER PER CELL 3.20 The ratio of HER2/CEN 17 is within the range < 2.0 of HER2/CEN 17 and a copy number of HER2 signals per cell is <4.0. Arch Pathol Lab Med 1:1,2018 Vicente Males MD Pathologist, Electronic Signature ( Signed 04/20/2018) 3. Results: IMMUNOHISTOCHEMICAL AND MORPHOMETRIC ANALYSIS PERFORMED MANUALLY The tumor cells are EQUIVOCAL for Her2 (2+). Her2 by FISH will be performed and the results reported separately. Estrogen Receptor: 50%, POSITIVE, STRONG STAINING INTENSITY Progesterone Receptor: 5%, POSITIVE, STRONG STAINING INTENSITY Proliferation Marker Ki67: 20% 2 of 4 FINAL for Guardia, Miranda Ayers ([id number]) ADDITIONAL INFORMATION:(continued) REFERENCE RANGE ESTROGEN RECEPTOR NEGATIVE 0% POSITIVE =>1% REFERENCE RANGE PROGESTERONE RECEPTOR NEGATIVE 0% POSITIVE  =>1% All controls stained appropriately Vicente Males MD Pathologist, Electronic Signature ( Signed 04/19/2018) FINAL DIAGNOSIS Diagnosis 1. Breast, right, needle core biopsy, 1 o'clock - INVASIVE DUCTAL  CARCINOMA WITH CALCIFICATIONS, SEE COMMENT. - DUCTAL CARCINOMA IN SITU. 2. Lymph node, needle/core biopsy, right axilla - METASTATIC CARCINOMA IN ONE LYMPH NODE. 3. Breast, right, needle core biopsy, lateral calcifications - INVASIVE DUCTAL CARCINOMA WITH CALCIFICATIONS                    Patient with history of right breast cancer status post neoadjuvant chemotherapy.  EXAM: BILATERAL BREAST MRI WITH AND WITHOUT CONTRAST  TECHNIQUE: Multiplanar, multisequence MR images of both breasts were obtained prior to and following the intravenous administration of 7 ml of Gadavist  Three-dimensional MR images were rendered by post-processing of the original MR data on an independent workstation. The three-dimensional MR images were interpreted, and findings are reported in the following complete MRI report for this study. Three dimensional images were evaluated at the independent DynaCad workstation  Creatinine was obtained on site at [facility] at [address]  Results: Creatinine 0.7 mg/dL.  COMPARISON: Previous exam(s).  FINDINGS: Breast composition: b. Scattered fibroglandular tissue.  Background parenchymal enhancement: Mild  Right breast: Wiithin the upper inner right breast middle depth there is susceptibility artifact within a mass. Mass is significantly decreased in size measuring 0.9 x 1.0 cm (image 52; series 7), previously 1.2 x 1.7 cm. There is minimal residual enhancement within this mass.  Slightly more inferiorly within the medial right breast is an irregular mass which which measures 2.4 x 1.4 cm (image 72; series 10), previously 2.3 x 1.9 cm at the same level. Overall this mass is decreased in  size.                                Within the outer aspect of the right breast there is susceptibility artifact compatible with biopsy marking clip. Overall when compared to prior exam there has been significant interval improvement and near complete resolution of previously described extensive mass and non mass enhancement throughout the majority of the right breast, predominately involving the outer aspect of the right breast however with involvement involving the medial anterior portion of the right breast.  Left breast: No mass or abnormal enhancement.  Lymph nodes: Interval resolution of previously described right axillary adenopathy. No left axillary adenopathy.  Ancillary findings: None.  IMPRESSION: Significant interval decrease in size of mass and non mass enhancement throughout the right breast compatible with treatment response.  Interval decrease in size of right axillary adenopathy.  RECOMMENDATION: Treatment plan for known right breast malignancy and right axillary metastatic adenopathy  BI-RADS CATEGORY 6: Known biopsy-proven malignancy.   Electronically Signed By: Lovey Newcomer M.D. On: 10/16/2018 13:54.  The patient is a 33 year old female.   Problem List/Past Medical Gabriel Cirri [place], CMA; 10/29/2018 2:17 PM) MALIGNANT NEOPLASM OF OVERLAPPING SITES OF RIGHT BREAST IN FEMALE, ESTROGEN RECEPTOR NEGATIVE (C50.811)  Allergies ([place], CMA; 10/29/2018 2:17 PM) No Known Allergies [10/29/2018]: No Known Drug Allergies [10/29/2018]: Allergies Reconciled  Medication History Nance Pew, CMA; 10/29/2018 2:17 PM) No Current Medications Medications Reconciled    Vitals (Sabrina Canty CMA; 10/29/2018 2:17 PM) 10/29/2018 2:17 PM Weight: 153.6 lb Height: 61in Body Surface Area: 1.69 m Body Mass Index: 29.02 kg/m  Temp.: 97.65F(Oral)  Pulse: 101 (Regular)   BP: 168/98 (Sitting, Left Arm, Standard)      Physical Exam (Nieve Rojero A. Jaleigha Deane MD; 10/29/2018 2:39 PM)  General Mental Status-Alert. General Appearance-Consistent with stated age. Hydration-Well hydrated. Voice-Normal.  Head and Neck Head-normocephalic,  atraumatic with no lesions or palpable masses.  Chest and Lung Exam Chest and lung exam reveals -quiet, even and easy respiratory effort with no use of accessory muscles and on auscultation, normal breath sounds, no adventitious sounds and normal vocal resonance. Inspection Chest Wall - Normal. Back - normal.  Breast Breast - Left-Symmetric, Non Tender, No Biopsy scars, no Dimpling, No Inflammation, No Lumpectomy scars, No Mastectomy scars, No Peau d' Orange. Breast - Right-Symmetric, Non Tender, No Biopsy scars, no Dimpling, No Inflammation, No Lumpectomy scars, No Mastectomy scars, No Peau d' Orange. Breast Lump-No Palpable Breast Mass.  Neurologic Neurologic evaluation reveals -alert and oriented x 3 with no impairment of recent or remote memory. Mental Status-Normal.  Lymphatic Head & Neck  General Head & Neck Lymphatics: Bilateral - Description - Normal. Axillary  General Axillary Region: Bilateral - Description - Normal. Tenderness - Non Tender.    Assessment & Plan (Hannah Strader A. 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 Given her large tumor size, she will require postmastectomy radiation therapy more than likely once delay reconstruction so that she may also get a breast reduction on the left. She has seen plastic surgery to discuss this. We will proceed with right simple mastectomy targeted right axillary lymph node biopsy and right axillary sentinel lymph node mapping. Discussed treatment  options for breast cancer to include breast conservation vs mastectomy with reconstruction. Pt has decided on mastectomy. Risk include bleeding, infection, flap necrosis, pain, numbness, recurrence, hematoma, other surgery needs. Pt understands and agrees to proceed. Risk of sentinel lymph node mapping include bleeding, infection, lymphedema, shoulder pain. stiffness, dye allergy. cosmetic deformity , blood clots, death, need for more surgery. Pt agres to proceed.  Current Plans You are being scheduled for surgery- Our schedulers will call you.  You should hear from our office's scheduling department within 5 working days about the location, date, and time of surgery. We try to make accommodations for patient's preferences in scheduling surgery, but sometimes the OR schedule or the surgeon's schedule prevents [place] from making those accommodations.  If you have not heard from our office [phone]) in 5 working days, call the office and ask for your surgeon's nurse.  If you have other questions about your diagnosis, plan, or surgery, call the office and ask for your surgeon's nurse.  Pt Education - CCS Mastectomy HCI The anatomy and the physiology was discussed. The pathophysiology and natural history of the disease was discussed. Options were discussed and recommendations were made. Technique, risks, benefits, & alternatives were discussed. Risks such as stroke, heart attack, bleeding, indection, death, and other risks discussed. Discussed flap necrosis revisional surgery lymphedema chronic shoulder pain chronic pain of the operative site and any further treatments and/or therapies Questions answered. The patient agrees to proceed.

## 2018-11-20 NOTE — Progress Notes (Signed)
Assisted Pamala Hurry, nuc med tech, with nuc med injections. Side rails up, monitors on throughout procedure. See vital signs in flow sheet. Tolerated Procedure well.

## 2018-11-20 NOTE — Op Note (Signed)
Miranda Ayers 25-Feb-1986 [id number] 11/20/2018   Preoperative diagnosis: right breast cancer  Postoperative diagnosis: same  Procedure: Right simple mastectomy with targeted right axillary deep lymph node biopsy and right axillary sentinel lymph node mapping using methylene blue dye and  port removal   Surgeon: Turner Daniels MD   Assistant surgeon: none  Anesthesia:GA combined with regional for post-op pain    Drains: two 19 round   Clinical History and Indications:The patient is seen in the holding area and we reviewed the plans for the procedure as noted above. We reviewed the risks and complications a final time. She had no further questions. I marked theright side as the operative side. The surgical and non surgical options have been discussed with the patient.  Risks of surgery include bleeding,  Infection,  Flap necrosis,  Tissue loss,  Chronic pain, death, Numbness,  And the need for additional procedures.  Reconstruction options also have been discussed with the patient as well.  The patient agrees to proceed.Sentinel lymph node mapping and dissection has been discussed with the patient.  Risk of bleeding,  Infection,  Seroma formation,  Additional procedures,,  Shoulder weakness ,  Shoulder stiffness,  Nerve and blood vessel injury and reaction to the mapping dyes have been discussed.  Alternatives to surgery have been discussed with the patient.  The patient agrees to proceed.  Description of Procedure:   The seed was localized using the neoprobe in the holding area.  The patient was taken to the operating room. After satisfactory general anesthesia was obtained the timeout was done.   I then injected 5 cc of dilute methylene blue and injected it subareaorly and massaged it in. A full prep and drape was then done.  I outlined an elliptical incision and marked the inframammary fold and midline. The incision was made. The usual skin flaps were raised. I went to the clavicle  superiorly and sternum medially and towards the latissimus laterally.    After the superior flap was complete I was able to use the neoprobe to locate targeted node.  This was removed and Faxitron revealed this seed to be in the node.  This was discussed with the radiologist.  The hydro-clip was not visualized but the lymph node did contain the seed and was properly targeted I felt.  Neoprobe switched to technetium settings and for additional sentinel nodes which were hot and mildly blue were removed and sent to pathology.  Background counts approached 0 and there is no palpable adenopathy noted.  The axillary vein, long thoracic nerve and thoracodorsal trunk were all preserved.  The port was removed.  Tract closed with 3 0 vicryl.      The inferior flap was then made going to the inframammary fold and out to the latissimus. The breast was then removed from the pectoralis starting medially and working laterally. When I got to the lateral aspect of the pectoralis major muscle I opened the clavipectoral fascia. I finished removing the breast from the serratus muscles.    I therefore completed the mastectomy by dividing the remaining attachments from the breast to the chest wall and latissimus but not taking any more tissue from the axilla. The specimen was marked to orient it for the pathologist and passed off the table.  I spent several minutes irrigating and making sure everything was dry. I then placed a  Two 19 [place] Blake drain through a small incision in the inferior flap and laid along the pectoralis muscle. It was  secured with a 2-0 nylon suture.  Another irrigation and check for hemostasis was made. Everything appeared to be dry. Incision was then closed with 3-0 Vicryl and 4-0 Monocryl.  Dermabond applied.  Breast binder placed.  The patient tolerated the procedure well. There were no operative complications. All counts were correct. Estimated blood loss was 50 cc. Sterile dressings were  applied and the patient taken to the PACU in satisfactory condition.  Turner Daniels, MD, FACS 11/20/2018 12:37 PM

## 2018-11-20 NOTE — Transfer of Care (Signed)
Immediate Anesthesia Transfer of Care Note  Patient: [employer]  Procedure(s) Performed: RIGHT SIMPLE MASTECTOMY WITH RIGHT RADIOACTIVE SEED TARGETED LYMPH NODE BIOPSY AND RIGHT  AXILLARY SENTINEL LYMPH NODE MAPPING AND PORT REMOVEL RIGHT CHEST (Right Breast)  Patient Location: PACU  Anesthesia Type:General  Level of Consciousness: awake  Airway & Oxygen Therapy: Patient connected to nasal cannula oxygen  Post-op Assessment: Report given to RN and Post -op Vital signs reviewed and stable  Post vital signs: Reviewed and stable  Last Vitals:  Vitals Value Taken Time  BP 154/99 11/20/2018 12:49 PM  Temp    Pulse 108 11/20/2018 12:53 PM  Resp 16 11/20/2018 12:53 PM  SpO2 100 % 11/20/2018 12:53 PM  Vitals shown include unvalidated device data.  Last Pain:  Vitals:   11/20/18 0858  TempSrc: Oral  PainSc: 0-No pain         Complications: No apparent anesthesia complications

## 2018-11-20 NOTE — Anesthesia Postprocedure Evaluation (Signed)
Anesthesia Post Note  Patient: Miranda Ayers, Miranda Ayers  Procedure(s) Performed: RIGHT SIMPLE MASTECTOMY WITH RIGHT RADIOACTIVE SEED TARGETED LYMPH NODE BIOPSY AND RIGHT  AXILLARY SENTINEL LYMPH NODE MAPPING AND PORT REMOVEL RIGHT CHEST (Right Breast)     Patient location during evaluation: PACU Anesthesia Type: General Level of consciousness: awake Pain management: pain level controlled Vital Signs Assessment: post-procedure vital signs reviewed and stable Cardiovascular status: stable Postop Assessment: no apparent nausea or vomiting Anesthetic complications: no    Last Vitals:  Vitals:   11/20/18 1345 11/20/18 1405  BP: (!) 152/88 (!) 146/95  Pulse: 91 (!) 101  Resp: 13 18  Temp:  (!) 36.2 C  SpO2: 97% 99%    Last Pain:  Vitals:   11/20/18 1405  TempSrc:   PainSc: 0-No pain                 Miranda Ayers

## 2018-11-21 ENCOUNTER — Encounter (HOSPITAL_BASED_OUTPATIENT_CLINIC_OR_DEPARTMENT_OTHER): Payer: Self-pay | Admitting: Surgery

## 2018-11-21 DIAGNOSIS — C50811 Malignant neoplasm of overlapping sites of right female breast: Secondary | ICD-10-CM | POA: Diagnosis not present

## 2018-11-21 NOTE — Discharge Instructions (Signed)
Surgical Drain Home Care °Surgical drains are used to remove extra fluid that normally builds up in a surgical wound after surgery. A surgical drain helps to heal a surgical wound. Different kinds of surgical drains include: °· Active drains. These drains use suction to pull drainage away from the surgical wound. Drainage flows through a tube to a container outside of the body. It is important to keep the bulb or the drainage container flat (compressed) at all times, except while you empty it. Flattening the bulb or container creates suction. The two most common types of active drains are bulb drains and Hemovac drains. °· Passive drains. These drains allow fluid to drain naturally, by gravity. Drainage flows through a tube to a bandage (dressing) or a container outside of the body. Passive drains do not need to be emptied. The most common type of passive drain is the Penrose drain. °A drain is placed during surgery. Immediately after surgery, drainage is usually bright red and a little thicker than water. The drainage may gradually turn yellow or pink and become thinner. It is likely that your health care provider will remove the drain when the drainage stops or when the amount decreases to 1-2 Tbsp (15-30 mL) during a 24-hour period. °How to care for your surgical drain °It is important to care for your drain to prevent infection. If your drain is placed at your back, or any other hard-to-reach area, ask another person to assist you in performing the following steps: °· Keep the skin around the drain dry and covered with a dressing at all times. °· Check your drain area every day for signs of infection. Check for: °? More redness, swelling, or pain. °? Pus or a bad smell. °? Cloudy drainage. °Follow instructions from your health care provider about how to take care of your drain and how to change your dressing. Change your dressing at least one time every day. Change it more often if needed to keep the dressing  dry. Make sure you: °1. Gather your supplies, including: °? Tape. °? Germ-free cleaning solution (sterile saline). °? Split gauze drain sponge: 4 x 4 inches (10 x 10 cm). °? Gauze square: 4 x 4 inches (10 x 10 cm). °2. Wash your hands with soap and water before you change your dressing. If soap and water are not available, use hand sanitizer. °3. Remove the old dressing. Avoid using scissors to do that. °4. Use sterile saline to clean your skin around the drain. °5. Place the tube through the slit in a drain sponge. Place the drain sponge so that it covers your wound. °6. Place the gauze square or another drain sponge on top of the drain sponge that is on the wound. Make sure the tube is between those layers. °7. Tape the dressing to your skin. °8. If you have an active bulb or Hemovac drain, tape the drainage tube to your skin 1-2 inches (2.5-5 cm) below the place where the tube enters your body. Taping keeps the tube from pulling on any stitches (sutures) that you have. °9. Wash your hands with soap and water. °10. Write down the color of your drainage and how often you change your dressing. °How to empty your active bulb or Hemovac drain ° °1. Make sure that you have a measuring cup that you can empty your drainage into. °2. Wash your hands with soap and water. If soap and water are not available, use hand sanitizer. °3. Gently move your fingers down the   tube while squeezing very lightly. This is called stripping the tube. This clears any drainage, clots, or tissue from the tube. ? Do not pull on the tube. ? You may need to strip the tube several times every day to keep the tube clear. 4. Open the bulb cap or the drain plug. Do not touch the inside of the cap or the bottom of the plug. 5. Empty all of the drainage into the measuring cup. 6. Compress the bulb or the container and replace the cap or the plug. To compress the bulb or the container, squeeze it firmly in the middle while you close the cap or plug  the container. 7. Write down the amount of drainage that you have in each 24-hour period. If you have less than 2 Tbsp (30 mL) of drainage during 24 hours, contact your health care provider. 8. Flush the drainage down the toilet. 9. Wash your hands with soap and water. Contact a health care provider if:  You have more redness, swelling, or pain around your drain area.  The amount of drainage that you have is increasing instead of decreasing.  You have pus or a bad smell coming from your drain area.  You have a fever.  You have drainage that is cloudy.  There is a sudden stop or a sudden decrease in the amount of drainage that you have.  Your tube falls out.  Your active draindoes not stay compressedafter you empty it. Summary  Surgical drains are used to remove extra fluid that normally builds up in a surgical wound after surgery.  Different kinds of surgical drains include active drains and passive drains. Active drains use suction to pull drainage away from the surgical wound, and passive drains allow fluid to drain naturally.  It is important to care for your drain to prevent infection. If your drain is placed at your back, or any other hard-to-reach area, ask another person to assist you.  Contact your health care provider if you have more redness, swelling, or pain around your drain area. This information is not intended to replace advice given to you by your health care provider. Make sure you discuss any questions you have with your health care provider. Document Released: 06/03/2000 Document Revised: 06/29/2017 Document Reviewed: 12/24/2014 Elsevier Interactive Patient Education  2019 [employer].  About my Jackson-Pratt Bulb Drain  What is a Jackson-Pratt bulb? A Jackson-Pratt is a soft, round device used to collect drainage. It is connected to a long, thin drainage catheter, which is held in place by one or two small stiches near your surgical incision site. When  the bulb is squeezed, it forms a vacuum, forcing the drainage to empty into the bulb.  Emptying the Jackson-Pratt bulb- To empty the bulb: 1. Release the plug on the top of the bulb. 2. Pour the bulb's contents into a measuring container which your nurse will provide. 3. Record the time emptied and amount of drainage. Empty the drain(s) as often as your     doctor or nurse recommends.  Date                  Time                    Amount (Drain 1)                 Amount (Drain 2)  _____________________________________________________________________  _____________________________________________________________________  _____________________________________________________________________  _____________________________________________________________________  _____________________________________________________________________  _____________________________________________________________________  _____________________________________________________________________  _____________________________________________________________________  Squeezing the Jackson-Pratt Bulb- To squeeze the bulb: 1. Make sure the plug at the top of the bulb is open. 2. Squeeze the bulb tightly in your fist. You will hear air squeezing from the bulb. 3. Replace the plug while the bulb is squeezed. 4. Use a safety pin to attach the bulb to your clothing. This will keep the catheter from     pulling at the bulb insertion site.  When to call your doctor- Call your doctor if:  Drain site becomes red, swollen or hot.  You have a fever greater than 101 degrees F.  There is oozing at the drain site.  Drain falls out (apply a guaze bandage over the drain hole and secure it with tape).  Drainage increases daily not related to activity patterns. (You will usually have more drainage when you are active than when you are resting.)  Drainage has a bad odor.     Call [phone] to schedule an  appointment with Dr. Brantley Stage   [facility] [place] [facility] [place] [phone]  MASTECTOMY: POST OP INSTRUCTIONS  Always review your discharge instruction sheet given to you by the facility where your surgery was performed. IF YOU HAVE DISABILITY OR FAMILY LEAVE FORMS, YOU MUST BRING THEM TO THE OFFICE FOR PROCESSING.   DO NOT GIVE THEM TO YOUR DOCTOR. A prescription for pain medication may be given to you upon discharge.  Take your pain medication as prescribed, if needed.  If narcotic pain medicine is not needed, then you may take acetaminophen (Tylenol) or ibuprofen (Advil) as needed. 1. Take your usually prescribed medications unless otherwise directed. 2. If you need a refill on your pain medication, please contact your pharmacy.  They will contact our office to request authorization.  Prescriptions will not be filled after 5pm or on week-ends. 3. You should follow a light diet the first few days after arrival home, such as soup and crackers, etc.  Resume your normal diet the day after surgery. 4. Most patients will experience some swelling and bruising on the chest and underarm.  Ice packs will help.  Swelling and bruising can take several days to resolve.  5. It is common to experience some constipation if taking pain medication after surgery.  Increasing fluid intake and taking a stool softener (such as Colace) will usually help or prevent this problem from occurring.  A mild laxative (Milk of Magnesia or Miralax) should be taken according to package instructions if there are no bowel movements after 48 hours. 6. Unless discharge instructions indicate otherwise, leave your bandage dry and in place until your next appointment in 3-5 days.  You may take a limited sponge bath.  No tube baths or showers until the drains are removed.  You may have steri-strips (small skin tapes) in place directly over the incision.  These strips should be left on the skin for 7-10 days.  If your surgeon used skin  glue on the incision, you may shower in 24 hours.  The glue will flake off over the next 2-3 weeks.  Any sutures or staples will be removed at the office during your follow-up visit. 7. DRAINS:  If you have drains in place, it is important to keep a list of the amount of drainage produced each day in your drains.  Before leaving the hospital, you should be instructed on drain care.  Call our office if you have any questions about your drains. 8. ACTIVITIES:  You may resume regular (light) daily activities beginning the  next day--such as daily self-care, walking, climbing stairs--gradually increasing activities as tolerated.  You may have sexual intercourse when it is comfortable.  Refrain from any heavy lifting or straining until approved by your doctor. a. You may drive when you are no longer taking prescription pain medication, you can comfortably wear a seatbelt, and you can safely maneuver your car and apply brakes. b. RETURN TO WORK:  __________________________________________________________ 9. You should see your doctor in the office for a follow-up appointment approximately 3-5 days after your surgery.  Your doctors nurse will typically make your follow-up appointment when she calls you with your pathology report.  Expect your pathology report 2-3 business days after your surgery.  You may call to check if you do not hear from [place] after three days.   10. OTHER INSTRUCTIONS: ______________________________________________________________________________________________ ____________________________________________________________________________________________ WHEN TO CALL YOUR DOCTOR: 1. Fever over 101.0 2. Nausea and/or vomiting 3. Extreme swelling or bruising 4. Continued bleeding from incision. 5. Increased pain, redness, or drainage from the incision. The clinic staff is available to answer your questions during regular business hours.  Please dont hesitate to call and ask to speak to one of  the nurses for clinical concerns.  If you have a medical emergency, go to the nearest emergency room or call 911.  A surgeon from [facility] is always on call at the hospital. [address], [place], [place], [place]  [address] ? [address], [place], [place]   [address] [phone] ? [phone] ? FAX [phone] Web site: [email or web address]

## 2018-11-21 NOTE — Discharge Summary (Signed)
Physician Discharge Summary  Patient ID: Miranda Ayers MRN: [id number] DOB/AGE: April 01, 1986 33 y.o.  Admit date: 11/20/2018 Discharge date: 11/21/2018  Admission Diagnoses:right breast cancer  Discharge Diagnoses:  same  Discharged Condition: good  Hospital Course: Pt did well after right mastectomy.  She tolerated her diet and had good pain control.  Flaps viable and drainage serous.   Consults: None   Treatments: surgery: right mastectomy   Discharge Exam: Blood pressure (!) 139/92, pulse 77, temperature (!) 97.2 F (36.2 C), resp. rate 16, height 5\' 1"  (1.549 m), weight 67.8 kg, SpO2 99 %. General appearance: alert and cooperative Resp: clear to auscultation bilaterally Incision/Wound:CD I flaps viable no hematoma right chest   Disposition: Discharge disposition: 01-Home or Self Care       Discharge Instructions    Diet - low sodium heart healthy   Complete by:  As directed    Diet - low sodium heart healthy   Complete by:  As directed    Increase activity slowly   Complete by:  As directed    Increase activity slowly   Complete by:  As directed      Allergies as of 11/21/2018   No Known Allergies     Medication List    TAKE these medications   hydrochlorothiazide 12.5 MG tablet Commonly known as:  HYDRODIURIL Take 12.5 mg by mouth daily.   ibuprofen 800 MG tablet Commonly known as:  ADVIL Take 1 tablet (800 mg total) by mouth every 8 (eight) hours as needed.   oxyCODONE 5 MG immediate release tablet Commonly known as:  Oxy IR/ROXICODONE Take 1 tablet (5 mg total) by mouth every 6 (six) hours as needed for severe pain.        Signed: Joyice Faster Elias Dennington 11/21/2018, 7:31 AM

## 2018-11-21 NOTE — Progress Notes (Signed)
1 Day Post-Op   Subjective/Chief Complaint: Doing well   Objective: Vital signs in last 24 hours: Temp:  [97 F (36.1 C)-98.3 F (36.8 C)] 97.2 F (36.2 C) (06/03 0215) Pulse Rate:  [77-113] 77 (06/03 0525) Resp:  [13-25] 16 (06/03 0215) BP: (125-155)/(88-109) 139/92 (06/03 0215) SpO2:  [97 %-100 %] 99 % (06/03 0525) Weight:  [67.8 kg] 67.8 kg (06/02 0858)    Intake/Output from previous day: 06/02 0701 - 06/03 0700 In: 3347 [P.O.:622; I.V.:2715] Out: 1467.5 [Urine:1175; Drains:92.5; Blood:200] Intake/Output this shift: No intake/output data recorded.  Incision/Wound:CDI flaps viable drains serous   Lab Results:  No results for input(s): WBC, HGB, HCT, PLT in the last 72 hours. BMET No results for input(s): NA, K, CL, CO2, GLUCOSE, BUN, CREATININE, CALCIUM in the last 72 hours. PT/INR No results for input(s): LABPROT, INR in the last 72 hours. ABG No results for input(s): PHART, HCO3 in the last 72 hours.  Invalid input(s): PCO2, PO2  Studies/Results: Nm Sentinel Node Inj-no Rpt (breast)  Result Date: 11/20/2018 Sulfur colloid was injected by the nuclear medicine technologist for melanoma sentinel node.   Mm Breast Surgical Specimen  Result Date: 11/20/2018 CLINICAL DATA:  33 year old patient had radioactive seed placement prior to targeted axillary nodal dissection. The radioactive seed disposition approximately 8 mm anterior and slightly inferior to the biopsy clip. The patient had neoadjuvant chemotherapy with resolution of lymphadenopathy. EXAM: SPECIMEN RADIOGRAPH OF THE RIGHT AXILLA COMPARISON:  Previous exam(s). FINDINGS: Status post excision of the right axilla the radioactive seed is present. The [facility] biopsy clip is not present within the specimen. Findings discussed with the operating room team. They are aware that the clip is not present. IMPRESSION: Specimen radiograph of the right axilla. Radioactive seed present. Biopsy clip not present. Electronically  Signed   By: Curlene Dolphin M.D.   On: 11/20/2018 11:05   [place] Rt Radioactive Seed Loc  Result Date: 11/19/2018 CLINICAL DATA:  Ultrasound-guided radioactive seed localization of a previously biopsied right axillary lymph node showing metastatic disease at pathology was requested. The patient has had neoadjuvant chemotherapy since her biopsy, with a significant positive response, and no residual axillary lymphadenopathy on recent breast MRI. EXAM: ULTRASOUND GUIDED RADIOACTIVE SEED LOCALIZATION OF THE RIGHT AXILLA COMPARISON:  Previous exam(s). FINDINGS: Patient presents for radioactive seed localization prior to targeted axillary nodal dissection. I met with the patient and we discussed the procedure of seed localization including benefits and alternatives. We discussed the high likelihood of a successful procedure. We discussed the risks of the procedure including infection, bleeding, tissue injury and further surgery. We discussed the low dose of radioactivity involved in the procedure. Informed, written consent was given. The usual time-out protocol was performed immediately prior to the procedure. Using ultrasound guidance, sterile technique, 1% lidocaine and an I-125 radioactive seed, a HydroMARK biopsy clip was localized using a lateral approach. The follow-up mammogram images confirm the seed in the expected location and were marked for Dr. Brantley Stage. Follow-up survey of the patient confirms presence of the radioactive seed. Order number of I-125 seed:  [id number]. Total activity: 0.249 mCi reference Date: 27 September 2018 The patient tolerated the procedure well and was released from the [place]. She was given instructions regarding seed removal. IMPRESSION: Radioactive seed localization right axilla. No apparent complications. Electronically Signed   By: Curlene Dolphin M.D.   On: 11/19/2018 16:35   Mm Clip Placement Right  Result Date: 11/19/2018 CLINICAL DATA:  Radioactive seed localization was performed  of  a visible HydroMARK biopsy clip in the right axilla. EXAM: DIAGNOSTIC RIGHT MAMMOGRAM POST ULTRASOUND RADIOACTIVE SEED LOCALIZATION COMPARISON:  Previous exam(s). FINDINGS: Mammographic images were obtained following ULTRASOUND guided radioactive seed localization of a right axillary lymph node. The radioactive seed is 8 mm anterior to and approximately 2 mm inferior to the biopsy clip. IMPRESSION: Satisfactory position of radioactive seed approximately 8 mm anterior and 2 mm inferior to the biopsy clip. Final Assessment: Post Procedure Mammograms for Marker Placement Electronically Signed   By: Curlene Dolphin M.D.   On: 11/19/2018 16:37    Anti-infectives: Anti-infectives (From admission, onward)   Start     Dose/Rate Route Frequency Ordered Stop   11/20/18 0915  ceFAZolin (ANCEF) IVPB 2g/100 mL premix     2 g 200 mL/hr over 30 Minutes Intravenous On call to O.R. 11/20/18 0903 11/20/18 1042      Assessment/Plan: s/p Procedure(s): RIGHT SIMPLE MASTECTOMY WITH RIGHT RADIOACTIVE SEED TARGETED LYMPH NODE BIOPSY AND RIGHT  AXILLARY SENTINEL LYMPH NODE MAPPING AND PORT REMOVEL RIGHT CHEST (Right) Discharge  LOS: 0 days    Marcello Moores A Julina Altmann 11/21/2018

## 2018-11-23 ENCOUNTER — Encounter: Payer: Self-pay | Admitting: Hematology and Oncology

## 2018-11-26 ENCOUNTER — Encounter: Payer: Self-pay | Admitting: *Deleted

## 2018-11-30 ENCOUNTER — Telehealth: Payer: Self-pay | Admitting: Hematology and Oncology

## 2018-11-30 NOTE — Telephone Encounter (Signed)
I talk with patient regarding video visit °

## 2018-11-30 NOTE — Assessment & Plan Note (Signed)
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 She can also proceed with radiation

## 2018-12-05 NOTE — Progress Notes (Signed)
HEMATOLOGY-ONCOLOGY MYCHART VIDEO VISIT PROGRESS NOTE  I connected with [name] on 12/06/2018 at  2:15 PM EDT by MyChart video conference and verified that I am speaking with the correct person using two identifiers.  I discussed the limitations, risks, security and privacy concerns of performing an evaluation and management service by MyChart and the availability of in person appointments.  I also discussed with the patient that there may be a patient responsible charge related to this service. The patient expressed understanding and agreed to proceed.  Patient's Location: Home Physician Location: Clinic I adjustment CHIEF COMPLIANT: Follow-up s/p mastectomy to review pathology  INTERVAL HISTORY: Miranda Ayers is a 33 y.o. female with above-mentioned history of right breast cancer who completed neoadjuvant chemotherapy and underwent a right mastectomy on 11/20/18. Pathology confirmed residual IDC, 1.8cm, with intermediate grade DCIS, clear margins, and 6 axillary lymph nodes negative for carcinoma. She presents over MyChart to discuss the pathology report and further treatment.   Oncology History  Malignant neoplasm of upper-inner quadrant of right breast in female, estrogen receptor positive ([place])  04/17/2018 Initial Diagnosis   Palpable right breast masses UOQ and UIQ with multiple masses 12 to 14 cm, at 1:00 1.6 cm, 1231.8 cm, and o'clock 4.5 cm and at 9:30 position 5 cm: Biopsy revealed IDC with DCIS, grade 3, ER 80%, PR 0%, HER-2 negative, Ki-67 20%; biopsy of the 4.5 cm mass came back as IDC grade 2-3, ER 50%, PR 5%, HER-2 -2+ ratio 1.6, copy #3.2, Ki-67 20%, T2N1 stage IIb   05/09/2018 - 10/08/2018 Neo-Adjuvant Chemotherapy   Neoadjuvant chemotherapy with dose dense Adriamycin and Cytoxan x4 followed by Taxol weekly x12   06/19/2018 Genetic Testing   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  The Multi-Gene Panel offered by [facility] includes  sequencing and/or deletion duplication testing of the following 84 genes: AIP, ALK, APC, ATM, AXIN2,BAP1,  BARD1, BLM, BMPR1A, BRCA1, BRCA2, BRIP1, CASR, CDC73, CDH1, CDK4, CDKN1B, CDKN1C, CDKN2A (p14ARF), CDKN2A (p16INK4a), CEBPA, CHEK2, CTNNA1, DICER1, DIS3L2, EGFR (c.2369C>T, p.Thr790Met variant only), EPCAM (Deletion/duplication testing only), FH, FLCN, GATA2, GPC3, GREM1 (Promoter region deletion/duplication testing only), HOXB13 (c.251G>A, p.Gly84Glu), HRAS, KIT, MAX, MEN1, MET, MITF (c.952G>A, p.Glu318Lys variant only), MLH1, MSH2, MSH3, MSH6, MUTYH, NBN, NF1, NF2, NTHL1, PALB2, PDGFRA, PHOX2B, PMS2, POLD1, POLE, POT1, PRKAR1A, PTCH1, PTEN, RAD50, RAD51C, RAD51D, RB1, RECQL4, RET, RUNX1, SDHAF2, SDHA (sequence changes only), SDHB, SDHC, SDHD, SMAD4, SMARCA4, SMARCB1, SMARCE1, STK11, SUFU, TERC, TERT, TMEM127, TP53, TSC1, TSC2, VHL, WRN and WT1.  The report date is 06/19/2018.   10/11/2018 Breast MRI   Significant decrease in the size of the right axillary adenopathy and right breast masses from 1.2cm to 0.9cm with minimal residual enhancement, and 2.4x1.4cm to 2.3x1.9cm.   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, largest 1.8 cm, intermediate grade DCIS, margins negative, lymphovascular space invasion present, 0/6 lymph nodes negative, ER 60%, PR 60%, HER-2 positive ratio 2.59   11/20/2018 Cancer Staging   Staging form: Breast, AJCC 8th Edition - Pathologic stage from 11/20/2018: Stage IA (pT1c, pN0, cM0, G2, ER+, PR+, HER2+) - Signed by [name], NP on 12/05/2018     REVIEW OF SYSTEMS:   Constitutional: Denies fevers, chills or abnormal weight loss Eyes: Denies blurriness of vision Ears, nose, mouth, throat, and face: Denies mucositis or sore throat Respiratory: Denies cough, dyspnea or wheezes Cardiovascular: Denies palpitation, chest discomfort Gastrointestinal:  Denies nausea, heartburn or change  in bowel habits Skin: Denies  abnormal skin rashes Lymphatics: Denies new lymphadenopathy or easy bruising Neurological:Denies numbness, tingling or new weaknesses Behavioral/Psych: Mood is stable, no new changes  Extremities: No lower extremity edema Breast: denies any pain or lumps or nodules in either breasts All other systems were reviewed with the patient and are negative.  Observations/Objective:  There were no vitals filed for this visit. There is no height or weight on file to calculate BMI.  I have reviewed the data as listed CMP Latest Ref Rng & Units 11/16/2018 10/09/2018 10/02/2018  Glucose 70 - 99 mg/dL 82 85 99  BUN 6 - 20 mg/dL 7 5(L) 6  Creatinine 0.44 - 1.00 mg/dL 0.74 0.67 0.72  Sodium 135 - 145 mmol/L 141 140 140  Potassium 3.5 - 5.1 mmol/L 4.8 4.1 3.6  Chloride 98 - 111 mmol/L 105 106 106  CO2 22 - 32 mmol/L 28 24 23   Calcium 8.9 - 10.3 mg/dL 10.2 9.7 9.4  Total Protein 6.5 - 8.1 g/dL 7.6 7.4 7.4  Total Bilirubin 0.3 - 1.2 mg/dL 0.8 0.3 0.2(L)  Alkaline Phos 38 - 126 U/L 60 68 69  AST 15 - 41 U/L 21 18 15   ALT 0 - 44 U/L 19 18 13     Lab Results  Component Value Date   WBC 2.7 (L) 11/16/2018   HGB 13.4 11/16/2018   HCT 41.3 11/16/2018   MCV 89.2 11/16/2018   PLT 207 11/16/2018   NEUTROABS 1.2 (L) 11/16/2018      Assessment Plan:  Malignant neoplasm of upper-inner quadrant of right breast in female, estrogen receptor positive ([place]) 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%, PR 0%, HER-2 negative, Ki-67 20%; biopsy of the 4.5 cm mass came back as IDC grade 2-3, ER 50%, PR 5%, HER-2 -2+ ratio 1.6, copy #3.2, Ki-67 20%, T2N1 stage IIb  Recommendation: 1.Neoadjuvant chemotherapy with dose dense Adriamycinand Cytoxan x4 followed by Taxol weekly x11 05/09/2018-10/02/2018 2. 11/20/2018:Rightmastectomy and axillary lymph node  dissection 3.Followed by radiation 4.Followed by adjuvant antiestrogen therapy ----------------------------------------------------------- 11/20/2018:Right mastectomy: Residual grade 2 invasive ductal carcinoma multifocal, largest 1.8 cm, intermediate grade DCIS, margins negative, lymphovascular space invasion present, 0/6 lymph nodes negative, ER 60%, PR 60%, HER-2 positive ratio 2.59  Pathology review: I discussed with the patient that the original pathology showed that she was HER-2 negative but the final pathology showed that the HER-2 was equivocal on the IHC which was 2+ and by FISH study was positive with a ratio of 2.59 and a copy number of 4.4  Recommendation: Recommend adjuvant anti-HER-2 therapy with Herceptin for 1 year. She will receive Herceptin through peripheral IV. We will not need to do labs because Herceptin does not affect blood work. We will start the treatment next week.  I will see her every 6 weeks for follow-up. She can also proceed with radiation    I discussed the assessment and treatment plan with the patient. The patient was provided an opportunity to ask questions and all were answered. The patient agreed with the plan and demonstrated an understanding of the instructions. The patient was advised to call back or seek an in-person evaluation if the symptoms worsen or if the condition fails to improve as anticipated.   I provided 15 minutes of face-to-face MyChart video visit time during  this encounter.    Rulon Eisenmenger, MD 12/06/2018   I, Molly Dorshimer, am acting as scribe for [name], MD.  I have reviewed the above documentation for accuracy and completeness, and I agree with the above.

## 2018-12-06 ENCOUNTER — Other Ambulatory Visit: Payer: Self-pay

## 2018-12-06 ENCOUNTER — Inpatient Hospital Stay: Payer: BC Managed Care – PPO | Attending: Genetic Counselor | Admitting: Hematology and Oncology

## 2018-12-06 DIAGNOSIS — C50211 Malignant neoplasm of upper-inner quadrant of right female breast: Secondary | ICD-10-CM | POA: Diagnosis not present

## 2018-12-06 DIAGNOSIS — Z170 Estrogen receptor positive status [ER+]: Secondary | ICD-10-CM | POA: Diagnosis not present

## 2018-12-06 DIAGNOSIS — Z9011 Acquired absence of right breast and nipple: Secondary | ICD-10-CM

## 2018-12-06 DIAGNOSIS — Z5112 Encounter for antineoplastic immunotherapy: Secondary | ICD-10-CM | POA: Insufficient documentation

## 2018-12-06 DIAGNOSIS — Z9221 Personal history of antineoplastic chemotherapy: Secondary | ICD-10-CM

## 2018-12-06 DIAGNOSIS — Z923 Personal history of irradiation: Secondary | ICD-10-CM | POA: Diagnosis not present

## 2018-12-06 NOTE — Progress Notes (Signed)
DISCONTINUE ON PATHWAY REGIMEN - Breast   Dose-Dense AC q14 days:   A cycle is every 14 days:     Doxorubicin      Cyclophosphamide      Pegfilgrastim-xxxx   **Always confirm dose/schedule in your pharmacy ordering system**  Paclitaxel 80 mg/m2 Weekly:   Administer weekly:     Paclitaxel   **Always confirm dose/schedule in your pharmacy ordering system**  REASON: Other Reason PRIOR TREATMENT: [id number]: Dose-Dense AC-T (Paclitaxel Weekly) - [Doxorubicin + Cyclophosphamide q14 Days x 4 Cycles, Followed by Paclitaxel 80 mg/m2 Weekly x 12 Weeks] TREATMENT RESPONSE: Partial Response (PR)  START OFF PATHWAY REGIMEN - Breast   [id number]:Trastuzumab (Maintenance Post TCH) x 11 Cycles:   A cycle is every 21 days:     Trastuzumab-xxxx   **Always confirm dose/schedule in your pharmacy ordering system**  Patient Characteristics: Post-Neoadjuvant Therapy and Resection, HER2 Positive, ER Negative/Unknown, Residual Disease, Adjuvant Targeted Therapy After Neoadjuvant Chemo/Targeted Therapy Therapeutic Status: Post-Neoadjuvant Therapy and Resection ER Status: Negative (-) HER2 Status: Positive (+) PR Status: Negative (-) Residual Invasive Disease Post-Neoadjuvant Therapy<= Yes Intent of Therapy: Curative Intent, Discussed with Patient

## 2018-12-11 ENCOUNTER — Telehealth: Payer: Self-pay | Admitting: Hematology and Oncology

## 2018-12-11 NOTE — Telephone Encounter (Signed)
I talk with patient regarding schedule  

## 2018-12-13 ENCOUNTER — Ambulatory Visit
Admission: RE | Admit: 2018-12-13 | Discharge: 2018-12-13 | Disposition: A | Discharge status: Home / Self Care | Payer: BC Managed Care – PPO | Source: Ambulatory Visit | Attending: Radiation Oncology | Admitting: Radiation Oncology

## 2018-12-13 ENCOUNTER — Ambulatory Visit (HOSPITAL_COMMUNITY)
Admission: RE | Admit: 2018-12-13 | Discharge: 2018-12-13 | Disposition: A | Discharge status: Home / Self Care | Payer: BC Managed Care – PPO | Source: Ambulatory Visit | Attending: Hematology and Oncology | Admitting: Hematology and Oncology

## 2018-12-13 ENCOUNTER — Encounter: Payer: Self-pay | Admitting: *Deleted

## 2018-12-13 ENCOUNTER — Other Ambulatory Visit: Payer: Self-pay | Admitting: Radiation Oncology

## 2018-12-13 ENCOUNTER — Other Ambulatory Visit: Payer: Self-pay

## 2018-12-13 VITALS — Wt 143.0 lb

## 2018-12-13 DIAGNOSIS — Z170 Estrogen receptor positive status [ER+]: Secondary | ICD-10-CM | POA: Insufficient documentation

## 2018-12-13 DIAGNOSIS — C50211 Malignant neoplasm of upper-inner quadrant of right female breast: Secondary | ICD-10-CM | POA: Insufficient documentation

## 2018-12-13 DIAGNOSIS — I10 Essential (primary) hypertension: Secondary | ICD-10-CM | POA: Insufficient documentation

## 2018-12-13 DIAGNOSIS — Z510 Encounter for antineoplastic radiation therapy: Secondary | ICD-10-CM | POA: Insufficient documentation

## 2018-12-13 LAB — ECHOCARDIOGRAM COMPLETE: Weight: 2288 oz

## 2018-12-13 NOTE — Progress Notes (Signed)
Radiation Oncology         [phone] ________________________________  Name: Miranda Ayers        MRN: [id number]  Date of Service: 12/13/2018 DOB: Dec 30, 1985  ZC:HYIFO, Lauralyn Primes, DO  [name], MD     REFERRING PHYSICIAN: [name], MD   DIAGNOSIS: The encounter diagnosis was Malignant neoplasm of upper-inner quadrant of right breast in female, estrogen receptor positive ([place]).   HISTORY OF PRESENT ILLNESS: Miranda Ayers is a 33 y.o. female originally seen in the multidisciplinary breast clinic for a new diagnosis of right breast cancer. The patient was noted to have a palpable mas sin her right breast and diagnostic imaging revealed a change in the upper outer and inner quadrants with multiple masses spanning 16 cm with fine calcifications spanning 12-14 cm. She had a 1.8 cm mass at 12:30, a 10:00 4.5 cm mass, and at 9:30 a 5 cm mass. She had a biopsy of the 1:00 position revealing invasive ductal caricnoma with clacifications, grade 3 and DCIS, ER/PR positive, HER2 negative, with a Ki 67 of 20%. Her 10:00 site revealed grade 2-3 invasive ductal carcinoma with DCIS, ER/PR positive, HER2 negative with a Ki 67 of 20% and her lymph node was also sampled and involved with carcinoma. She began neoadjuvant chemotherapy on 05/09/18 and completed adriamycin/cytoxan followed by 11 weekly taxol doses on 10/02/2018. She underwent right mastectomy on 11/20/18 with ALND and final pathology revealed residual multifocal disease in the right breast the largest of the lesions measuring up to 18 mm with intermediate grade DCIS, calcifications, negative margins but LVSI.  She had 6 lymph nodes removed, all of which were negative for disease.  Her tumor continue to show estrogen and progesterone positivity however her HER-2 component was now positive.  She is planning to begin Herceptin under the care of Dr. Lindi Adie every 3 weeks.  She is seen today to discuss the rationale for adjuvant  radiotherapy.     PREVIOUS RADIATION THERAPY: No   PAST MEDICAL HISTORY:  Past Medical History:  Diagnosis Date   Cancer [facility])    breast cancer   Family history of colon cancer    Family history of lung cancer    Family history of pancreatic cancer    Hypertension        PAST SURGICAL HISTORY: Past Surgical History:  Procedure Laterality Date   MASTECTOMY WITH RADIOACTIVE SEED GUIDED EXCISION AND AXILLARY SENTINEL LYMPH NODE BIOPSY Right 11/20/2018   Procedure: RIGHT SIMPLE MASTECTOMY WITH RIGHT RADIOACTIVE SEED TARGETED LYMPH NODE BIOPSY AND RIGHT  AXILLARY SENTINEL LYMPH NODE MAPPING AND PORT REMOVEL RIGHT CHEST;  Surgeon: [name], MD;  Location: [place];  Service: General;  Laterality: Right;   OOPHORECTOMY  2013   PORTACATH PLACEMENT N/A 05/08/2018   Procedure: INSERTION PORT-A-CATH WITH ULTRASOUND;  Surgeon: [name], MD;  Location: [place];  Service: General;  Laterality: N/A;     FAMILY HISTORY:  Family History  Problem Relation Age of Onset   Lung cancer Maternal Grandmother    Lung cancer Maternal Grandfather    Colon cancer Maternal Grandfather    Stomach cancer Paternal Grandfather    Lung cancer Maternal Aunt    Pancreatic cancer Maternal Aunt    Lung cancer Maternal Uncle    Cancer Cousin        childhood cancer in mat first cousin     SOCIAL HISTORY:  reports that she has never smoked. She has never used smokeless  tobacco. She reports that she does not drink alcohol or use drugs. The patient is single and lives in [place], she works for [employer]. She has a 25 year old daughter.    ALLERGIES: Patient has no known allergies.   MEDICATIONS:  Current Outpatient Medications  Medication Sig Dispense Refill   hydrochlorothiazide (HYDRODIURIL) 12.5 MG tablet Take 12.5 mg by mouth daily.     ibuprofen (ADVIL) 800 MG tablet Take 1 tablet (800 mg total) by mouth every 8 (eight) hours as needed.  30 tablet 0   oxyCODONE (OXY IR/ROXICODONE) 5 MG immediate release tablet Take 1 tablet (5 mg total) by mouth every 6 (six) hours as needed for severe pain. 15 tablet 0   No current facility-administered medications for this encounter.      REVIEW OF SYSTEMS: On review of systems, the patient reports that she is doing well overall.  She reports that she was somewhat tired in the midst of chemotherapy but feels as though she is done pretty well overall.  She denies any chest pain, shortness of breath, cough, fevers, chills, night sweats, unintended weight changes. She denies any bowel or bladder disturbances, and denies abdominal pain, nausea or vomiting. She denies any new musculoskeletal or joint aches or pains. A complete review of systems is obtained and is otherwise negative.     PHYSICAL EXAM:  Wt Readings from Last 3 Encounters:  11/20/18 149 lb 7.6 oz (67.8 kg)  10/09/18 148 lb 11.2 oz (67.4 kg)  09/25/18 145 lb 12.8 oz (66.1 kg)   Temp Readings from Last 3 Encounters:  11/21/18 97.6 F (36.4 C)  10/09/18 98.2 F (36.8 C) (Oral)  10/06/18 99.5 F (37.5 C) (Oral)   BP Readings from Last 3 Encounters:  11/21/18 137/86  10/09/18 (!) 139/95  10/06/18 (!) 138/96   Pulse Readings from Last 3 Encounters:  11/21/18 95  10/09/18 100  10/06/18 (!) 105   In general this is a well appearing African American in no acute distress. She's alert and oriented x4 and appropriate throughout the examination. Cardiopulmonary assessment is negative for acute distress and she exhibits normal effort.  Her right mastectomy scar line appears to have mild separation at the lateral edge without evidence of erythema cellulitic streaking or bleeding.  No evidence of edema is noted of the chest wall.  ECOG = 1  0 - Asymptomatic (Fully active, able to carry on all predisease activities without restriction)  1 - Symptomatic but completely ambulatory (Restricted in physically strenuous activity but  ambulatory and able to carry out work of a light or sedentary nature. For example, light housework, office work)  2 - Symptomatic, <50% in bed during the day (Ambulatory and capable of all self care but unable to carry out any work activities. Up and about more than 50% of waking hours)  3 - Symptomatic, >50% in bed, but not bedbound (Capable of only limited self-care, confined to bed or chair 50% or more of waking hours)  4 - Bedbound (Completely disabled. Cannot carry on any self-care. Totally confined to bed or chair)  5 - Death   Eustace Pen MM, Creech RH, Tormey DC, et al. [phone]). "Toxicity and response criteria of the [facility]". [place] Oncol. 5 (6): 649-55    LABORATORY DATA:  Lab Results  Component Value Date   WBC 2.7 (L) 11/16/2018   HGB 13.4 11/16/2018   HCT 41.3 11/16/2018   MCV 89.2 11/16/2018   PLT 207 11/16/2018  Lab Results  Component Value Date   NA 141 11/16/2018   K 4.8 11/16/2018   CL 105 11/16/2018   CO2 28 11/16/2018   Lab Results  Component Value Date   ALT 19 11/16/2018   AST 21 11/16/2018   ALKPHOS 60 11/16/2018   BILITOT 0.8 11/16/2018      RADIOGRAPHY: Nm Sentinel Node Inj-no Rpt (breast)  Result Date: 11/20/2018 Sulfur colloid was injected by the nuclear medicine technologist for melanoma sentinel node.   Mm Breast Surgical Specimen  Result Date: 11/20/2018 CLINICAL DATA:  33 year old patient had radioactive seed placement prior to targeted axillary nodal dissection. The radioactive seed disposition approximately 8 mm anterior and slightly inferior to the biopsy clip. The patient had neoadjuvant chemotherapy with resolution of lymphadenopathy. EXAM: SPECIMEN RADIOGRAPH OF THE RIGHT AXILLA COMPARISON:  Previous exam(s). FINDINGS: Status post excision of the right axilla the radioactive seed is present. The [facility] biopsy clip is not present within the specimen. Findings discussed with the operating room team. They are  aware that the clip is not present. IMPRESSION: Specimen radiograph of the right axilla. Radioactive seed present. Biopsy clip not present. Electronically Signed   By: Curlene Dolphin M.D.   On: 11/20/2018 11:05   [place] Rt Radioactive Seed Loc  Result Date: 11/19/2018 CLINICAL DATA:  Ultrasound-guided radioactive seed localization of a previously biopsied right axillary lymph node showing metastatic disease at pathology was requested. The patient has had neoadjuvant chemotherapy since her biopsy, with a significant positive response, and no residual axillary lymphadenopathy on recent breast MRI. EXAM: ULTRASOUND GUIDED RADIOACTIVE SEED LOCALIZATION OF THE RIGHT AXILLA COMPARISON:  Previous exam(s). FINDINGS: Patient presents for radioactive seed localization prior to targeted axillary nodal dissection. I met with the patient and we discussed the procedure of seed localization including benefits and alternatives. We discussed the high likelihood of a successful procedure. We discussed the risks of the procedure including infection, bleeding, tissue injury and further surgery. We discussed the low dose of radioactivity involved in the procedure. Informed, written consent was given. The usual time-out protocol was performed immediately prior to the procedure. Using ultrasound guidance, sterile technique, 1% lidocaine and an I-125 radioactive seed, a HydroMARK biopsy clip was localized using a lateral approach. The follow-up mammogram images confirm the seed in the expected location and were marked for Dr. Brantley Stage. Follow-up survey of the patient confirms presence of the radioactive seed. Order number of I-125 seed:  [id number]. Total activity: 0.249 mCi reference Date: 27 September 2018 The patient tolerated the procedure well and was released from the [place]. She was given instructions regarding seed removal. IMPRESSION: Radioactive seed localization right axilla. No apparent complications. Electronically Signed   By:  Curlene Dolphin M.D.   On: 11/19/2018 16:35   Mm Clip Placement Right  Result Date: 11/19/2018 CLINICAL DATA:  Radioactive seed localization was performed of a visible HydroMARK biopsy clip in the right axilla. EXAM: DIAGNOSTIC RIGHT MAMMOGRAM POST ULTRASOUND RADIOACTIVE SEED LOCALIZATION COMPARISON:  Previous exam(s). FINDINGS: Mammographic images were obtained following ULTRASOUND guided radioactive seed localization of a right axillary lymph node. The radioactive seed is 8 mm anterior to and approximately 2 mm inferior to the biopsy clip. IMPRESSION: Satisfactory position of radioactive seed approximately 8 mm anterior and 2 mm inferior to the biopsy clip. Final Assessment: Post Procedure Mammograms for Marker Placement Electronically Signed   By: Curlene Dolphin M.D.   On: 11/19/2018 16:37       IMPRESSION/PLAN: 1. Stage IIIA, cT3N1M0, grade 3,  ER/PR positive invasive ductal carcinoma with associated DCIS of the right breast. Dr. Lisbeth Renshaw discusses the patient's course and final pathology findings and reviews the nature of node positive invasive ductal carcinoma.  We discussed today that she would still benefit from adjuvant external radiotherapy to the chest wall and regional nodes followed by antiestrogen therapy. We discussed the risks, benefits, short, and long term effects of radiotherapy, and the patient is interested in proceeding.  She will also begin Herceptin with peripheral IV in the coming week.  Dr. Lisbeth Renshaw discusses the delivery and logistics of radiotherapy and anticipates a course of 6 1/2 weeks of chest wall radiotherapy with right regional node coverage.  She will simulate today and signed written consent. 2. Healing following mastectomy.  The patient has mild separation, we will discuss this further during her simulation when I am able to examine the area. 3. Contraceptive counseling.  The patient is aware of the risks of getting pregnant during radiotherapy, she assures me that she has not  been sexually active in many months, and did have a normal menstrual cycle in January of this year.  She has not had any additional cycle since chemotherapy.  After discussing this, she feels as though she is informed, and declines urine pregnancy test in order to proceed with today's planning.  This encounter was provided by telemedicine platform Web[employer].  The patient has given verbal consent for this type of encounter and has been advised to only accept a meeting of this type in a secure network environment. The time spent during this encounter was 25 minutes. The attendants for this meeting include Blenda Nicely, RN , Dr. Lisbeth Renshaw, Hayden Pedro  and [facility].  During the encounter,  Blenda Nicely, RN, Dr. Lisbeth Renshaw, and Hayden Pedro were located at [facility] Radiation Oncology Department.  Miranda Ayers was located at home.     The above documentation reflects my direct findings during this shared patient visit. Please see the separate note by Dr. Lisbeth Renshaw on this date for the remainder of the patient's plan of care.    Carola Rhine, PAC

## 2018-12-13 NOTE — Progress Notes (Signed)
  Echocardiogram 2D Echocardiogram has been performed.  Randa Lynn Cheral Cappucci 12/13/2018, 2:13 PM

## 2018-12-14 ENCOUNTER — Other Ambulatory Visit: Payer: Self-pay

## 2018-12-14 ENCOUNTER — Inpatient Hospital Stay: Payer: BC Managed Care – PPO

## 2018-12-14 VITALS — BP 136/103 | HR 83 | Temp 99.1°F | Resp 16 | Wt 145.2 lb

## 2018-12-14 DIAGNOSIS — C50211 Malignant neoplasm of upper-inner quadrant of right female breast: Secondary | ICD-10-CM | POA: Diagnosis present

## 2018-12-14 DIAGNOSIS — Z5112 Encounter for antineoplastic immunotherapy: Secondary | ICD-10-CM | POA: Diagnosis not present

## 2018-12-14 DIAGNOSIS — Z170 Estrogen receptor positive status [ER+]: Secondary | ICD-10-CM

## 2018-12-14 MED ORDER — TRASTUZUMAB CHEMO 150 MG IV SOLR
8.0000 mg/kg | Freq: Once | INTRAVENOUS | Status: AC
  Administered 2018-12-14: 546 mg via INTRAVENOUS
  Filled 2018-12-14: qty 26

## 2018-12-14 MED ORDER — DIPHENHYDRAMINE HCL 25 MG PO CAPS
ORAL_CAPSULE | ORAL | Status: AC
  Filled 2018-12-14: qty 2

## 2018-12-14 MED ORDER — ACETAMINOPHEN 325 MG PO TABS
ORAL_TABLET | ORAL | Status: AC
  Filled 2018-12-14: qty 2

## 2018-12-14 MED ORDER — SODIUM CHLORIDE 0.9 % IV SOLN
Freq: Once | INTRAVENOUS | Status: AC
  Administered 2018-12-14: 09:00:00 via INTRAVENOUS
  Filled 2018-12-14: qty 250

## 2018-12-14 MED ORDER — DIPHENHYDRAMINE HCL 25 MG PO CAPS
50.0000 mg | ORAL_CAPSULE | Freq: Once | ORAL | Status: AC
  Administered 2018-12-14: 50 mg via ORAL

## 2018-12-14 MED ORDER — ACETAMINOPHEN 325 MG PO TABS
650.0000 mg | ORAL_TABLET | Freq: Once | ORAL | Status: AC
  Administered 2018-12-14: 650 mg via ORAL

## 2018-12-14 NOTE — Patient Instructions (Signed)
[facility] Discharge Instructions for Patients Receiving Chemotherapy  Today you received the following immunotherapy Herceptin     BELOW ARE SYMPTOMS THAT SHOULD BE REPORTED IMMEDIATELY:  *FEVER GREATER THAN 100.5 F  *CHILLS WITH OR WITHOUT FEVER  NAUSEA AND VOMITING THAT IS NOT CONTROLLED WITH YOUR NAUSEA MEDICATION  *UNUSUAL SHORTNESS OF BREATH  *UNUSUAL BRUISING OR BLEEDING  TENDERNESS IN MOUTH AND THROAT WITH OR WITHOUT PRESENCE OF ULCERS  *URINARY PROBLEMS  *BOWEL PROBLEMS  UNUSUAL RASH Items with * indicate a potential emergency and should be followed up as soon as possible.  Feel free to call the clinic should you have any questions or concerns. The clinic phone number is [phone].  Please show the [facility] at check-in to the Emergency Department and triage nurse.  Trastuzumab injection for infusion (herceptin) What is this medicine? TRASTUZUMAB (tras TOO zoo mab) is a monoclonal antibody. It is used to treat breast cancer and stomach cancer. This medicine may be used for other purposes; ask your health care provider or pharmacist if you have questions. COMMON BRAND NAME(S): Herceptin, Calla Kicks, OGIVRI What should I tell my health care provider before I take this medicine? They need to know if you have any of these conditions: -heart disease -heart failure -lung or breathing disease, like asthma -an unusual or allergic reaction to trastuzumab, benzyl alcohol, or other medications, foods, dyes, or preservatives -pregnant or trying to get pregnant -breast-feeding How should I use this medicine? This drug is given as an infusion into a vein. It is administered in a hospital or clinic by a specially trained health care professional. Talk to your pediatrician regarding the use of this medicine in children. This medicine is not approved for use in children. Overdosage: If you think you have taken too much of this medicine contact a poison  control center or emergency room at once. NOTE: This medicine is only for you. Do not share this medicine with others. What if I miss a dose? It is important not to miss a dose. Call your doctor or health care professional if you are unable to keep an appointment. What may interact with this medicine? This medicine may interact with the following medications: -certain types of chemotherapy, such as daunorubicin, doxorubicin, epirubicin, and idarubicin This list may not describe all possible interactions. Give your health care provider a list of all the medicines, herbs, non-prescription drugs, or dietary supplements you use. Also tell them if you smoke, drink alcohol, or use illegal drugs. Some items may interact with your medicine. What should I watch for while using this medicine? Visit your doctor for checks on your progress. Report any side effects. Continue your course of treatment even though you feel ill unless your doctor tells you to stop. Call your doctor or health care professional for advice if you get a fever, chills or sore throat, or other symptoms of a cold or flu. Do not treat yourself. Try to avoid being around people who are sick. You may experience fever, chills and shaking during your first infusion. These effects are usually mild and can be treated with other medicines. Report any side effects during the infusion to your health care professional. Fever and chills usually do not happen with later infusions. Do not become pregnant while taking this medicine or for 7 months after stopping it. Women should inform their doctor if they wish to become pregnant or think they might be pregnant. Women of child-bearing potential will need to have  a negative pregnancy test before starting this medicine. There is a potential for serious side effects to an unborn child. Talk to your health care professional or pharmacist for more information. Do not breast-feed an infant while taking this  medicine or for 7 months after stopping it. Women must use effective birth control with this medicine. What side effects may I notice from receiving this medicine? Side effects that you should report to your doctor or health care professional as soon as possible: -allergic reactions like skin rash, itching or hives, swelling of the face, lips, or tongue -chest pain or palpitations -cough -dizziness -feeling faint or lightheaded, falls -fever -general ill feeling or flu-like symptoms -signs of worsening heart failure like breathing problems; swelling in your legs and feet -unusually weak or tired Side effects that usually do not require medical attention (report to your doctor or health care professional if they continue or are bothersome): -bone pain -changes in taste -diarrhea -joint pain -nausea/vomiting -weight loss This list may not describe all possible side effects. Call your doctor for medical advice about side effects. You may report side effects to FDA at 1-800-FDA-1088. Where should I keep my medicine? This drug is given in a hospital or clinic and will not be stored at home. NOTE: This sheet is a summary. It may not cover all possible information. If you have questions about this medicine, talk to your doctor, pharmacist, or health care provider.  2019 Elsevier/Gold Standard (2016-05-31 14:37:52)

## 2018-12-14 NOTE — Progress Notes (Signed)
Checked in on pt in infusion room to see if she needed any questions answered regarding first herceptin treatment today.  She denies need & has written information on drug.

## 2018-12-14 NOTE — Progress Notes (Signed)
Per Dr. Lindi Adie ok to give herceptin today with elevated blood pressure

## 2018-12-17 ENCOUNTER — Telehealth: Payer: Self-pay

## 2018-12-17 NOTE — Telephone Encounter (Signed)
RN placed call to follow up with first Herceptin treatment.  Pt tolerating well, no complaints.  RN answered questions, no further needs.

## 2018-12-20 ENCOUNTER — Encounter: Payer: Self-pay | Admitting: Hematology and Oncology

## 2018-12-20 NOTE — Progress Notes (Signed)
Received message from [facility] that patient is now on Herceptin and may be able to use assistance.  Applied for copay assistance online through [employer] for Herceptin.  Patient approved for $25,000 effective 12/20/18 for the calendar year as long as she remains commercially insured and will only leave her with a $5 copay after insurance pays. Patient will receive a copy of the approval letter in the mail. Copy will be given to [facility] for billing/copay purposes.  Left patient a voicemail with this information and my contact name and number.

## 2018-12-24 ENCOUNTER — Ambulatory Visit: Payer: Self-pay | Admitting: Surgery

## 2018-12-24 ENCOUNTER — Ambulatory Visit: Payer: BC Managed Care – PPO | Admitting: Rehabilitation

## 2018-12-24 ENCOUNTER — Ambulatory Visit: Payer: BC Managed Care – PPO | Admitting: Radiation Oncology

## 2018-12-24 NOTE — H&P (Signed)
Miranda Ayers Documented: 12/24/2018 2:19 PM Location: [facility] Patient #: [id number] DOB: March 25, 1986 Undefined / Language: Miranda Ayers / Race: Black or African American Female  History of Present Illness Miranda Moores A. Jartavious Mckimmy MD; 12/24/2018 2:42 PM) Patient words: Patient returns for follow-up. 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 Her Port-A-Cath was removed surgery per request of oncology 5 pathology was different than her preoperative pathology. She is scheduled for radiation therapy in the subsequent reconstruction later this year. Overall she is doing well.  The patient is a 33 year old female.   Allergies Emeline Gins, [place]; 12/24/2018 2:20 PM) No Known Allergies [10/29/2018]: No Known Drug Allergies [10/29/2018]: Allergies Reconciled  Medication History Emeline Gins, CMA; 12/24/2018 2:20 PM) Ibuprofen (800MG Tablet, Oral) Active. hydroCHLOROthiazide (25MG Tablet, Oral) Active. Medications Reconciled    Vitals Emeline Gins CMA; 12/24/2018 2:20 PM) 12/24/2018 2:19 PM Weight: 145.4 lb Height: 61in Body Surface Area: 1.65 m Body Mass Index: 27.47 kg/m  Temp.: 41F  Pulse: 94 (Regular)  BP: 126/78 (Sitting, Left Arm, Standard)        Physical Exam (Wakeelah Solan A. Keala Drum MD; 12/24/2018 2:42 PM)  Breast Note: Right breast surgically. No edema.    Assessment & Plan (Lynnleigh Soden A. 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 Her preop tumor panel showed this to be negative. She will receive 12 months of anti-HER-2/neu therapy. She may need her Port-A-Cath or placing our contact her oncologist to help me that next month or so. She is scheduled for radiation therapy to begin in mid July. Pt requires port placement for chemotherapy. Risk include bleeding, infection, pneumothorax, hemothorax, mediastinal injury, nerve  injury , blood vessel injury, strke, blood clots, death, migration. embolization and need for additional procedures. Pt agrees to proceed.  Current Plans Pt Education - [facility] Portacath HCI Use of a central venous catheter for intravenous therapy was discussed. Technique of catheter placement using ultrasound and fluoroscopy guidance was discussed. Risks such as bleeding, infection, pneumothorax, catheter occlusion, reoperation, and other risks were discussed. I noted a good likelihood this will help address the problem. Questions were answered. The patient expressed understanding & wishes to proceed.

## 2018-12-25 ENCOUNTER — Ambulatory Visit: Payer: BC Managed Care – PPO

## 2018-12-25 ENCOUNTER — Encounter: Payer: Self-pay | Admitting: Physical Therapy

## 2018-12-25 ENCOUNTER — Other Ambulatory Visit: Payer: Self-pay

## 2018-12-25 ENCOUNTER — Ambulatory Visit: Payer: BC Managed Care – PPO | Attending: Radiation Oncology | Admitting: Physical Therapy

## 2018-12-25 DIAGNOSIS — R293 Abnormal posture: Secondary | ICD-10-CM | POA: Diagnosis present

## 2018-12-25 DIAGNOSIS — M6281 Muscle weakness (generalized): Secondary | ICD-10-CM | POA: Diagnosis present

## 2018-12-25 DIAGNOSIS — M25611 Stiffness of right shoulder, not elsewhere classified: Secondary | ICD-10-CM | POA: Insufficient documentation

## 2018-12-25 NOTE — Therapy (Signed)
[place], [place], [address] Phone: [phone]   Fax:  [phone]  Physical Therapy Evaluation  Patient Details  Name: Miranda Ayers MRN: [id number] Date of Birth: 01-Sep-1985 Referring Provider (PT): [facility]   Encounter Date: 12/25/2018  PT End of Session - 12/25/18 1005    Visit Number  1    Number of Visits  9    Date for PT Re-Evaluation  01/22/19    PT Start Time  0932    PT Stop Time  1005    PT Time Calculation (min)  33 min    Activity Tolerance  Patient tolerated treatment well    Behavior During Therapy  [facility] for tasks assessed/performed       Past Medical History:  Diagnosis Date  . Cancer [facility])    breast cancer  . Family history of colon cancer   . Family history of lung cancer   . Family history of pancreatic cancer   . Hypertension     Past Surgical History:  Procedure Laterality Date  . MASTECTOMY WITH RADIOACTIVE SEED GUIDED EXCISION AND AXILLARY SENTINEL LYMPH NODE BIOPSY Right 11/20/2018   Procedure: RIGHT SIMPLE MASTECTOMY WITH RIGHT RADIOACTIVE SEED TARGETED LYMPH NODE BIOPSY AND RIGHT  AXILLARY SENTINEL LYMPH NODE MAPPING AND PORT REMOVEL RIGHT CHEST;  Surgeon: [name], MD;  Location: [facility];  Service: General;  Laterality: Right;  . OOPHORECTOMY  2013  . PORTACATH PLACEMENT N/A 05/08/2018   Procedure: INSERTION PORT-A-CATH WITH ULTRASOUND;  Surgeon: [name], MD;  Location: [facility];  Service: General;  Laterality: N/A;    There were no vitals filed for this visit.   Subjective Assessment - 12/25/18 0935    Subjective  I am right handed and I have trouble reaching up. It limits me and I am short so it is hard to reach up.    Pertinent History  11/20/18- right mastectomy for treatment of right breast cancer as well as SLNB (2 of 4 nodes positive), pt is currently undergoing chemo, pt will require radiation and will begin  next week    Patient Stated Goals  get rid of the cording    Currently in Pain?  No/denies    Pain Score  0-No pain         [facility] PT Assessment - 12/25/18 0001      Assessment   Medical Diagnosis  right breast cancer    Referring Provider (PT)  [facility]    Onset Date/Surgical Date  11/20/18    Hand Dominance  Right    Prior Therapy  none      Precautions   Precautions  Other (comment)    Precaution Comments  at risk of lymphedema      Restrictions   Weight Bearing Restrictions  No      Balance Screen   Has the patient fallen in the past 6 months  No    Has the patient had a decrease in activity level because of a fear of falling?   No    Is the patient reluctant to leave their home because of a fear of falling?   No      Home Film/video editor residence    Freight forwarder;Children    Available Help at Discharge  Family    Type of Home  House      Prior Function   Level of Independence  Independent  Vocation  Full time employment   currently on Medical leave   Vocation Requirements  lifting heavy bread pans    Leisure  3x wk pt walks for 1-2 miles      Cognition   Overall Cognitive Status  Within Functional Limits for tasks assessed      Observation/Other Assessments   Skin Integrity  several cords can be palpated in right upper inner arm      ROM / Strength   AROM / PROM / Strength  AROM      AROM   AROM Assessment Site  Shoulder    Right/Left Shoulder  Right;Left    Right Shoulder Flexion  153 Degrees    Right Shoulder ABduction  173 Degrees    Right Shoulder Internal Rotation  45 Degrees    Right Shoulder External Rotation  84 Degrees    Left Shoulder Flexion  164 Degrees    Left Shoulder ABduction  179 Degrees    Left Shoulder Internal Rotation  40 Degrees    Left Shoulder External Rotation  75 Degrees        LYMPHEDEMA/ONCOLOGY QUESTIONNAIRE - 12/25/18 0954      Type   Cancer Type  right breast cancer       Surgeries   Mastectomy Date  11/20/18    Sentinel Lymph Node Biopsy Date  11/20/18    Number Lymph Nodes Removed  4   2 were positive     Treatment   Active Chemotherapy Treatment  Yes    Past Chemotherapy Treatment  No    Active Radiation Treatment  No   pt will begin next week   Past Radiation Treatment  No    Current Hormone Treatment  No    Past Hormone Therapy  No      What other symptoms do you have   Are you Having Heaviness or Tightness  Yes    Are you having Pain  Yes    Are you having pitting edema  No    Is it Hard or Difficult finding clothes that fit  No    Do you have infections  No    Is there Decreased scar mobility  No      Lymphedema Assessments   Lymphedema Assessments  Upper extremities      Right Upper Extremity Lymphedema   15 cm Proximal to Olecranon Process  30 cm    Olecranon Process  23.6 cm    15 cm Proximal to Ulnar Styloid Process  24 cm    Just Proximal to Ulnar Styloid Process  15.5 cm    Across Hand at [employer]  18.5 cm    At [place] of 2nd Digit  6 cm      Left Upper Extremity Lymphedema   15 cm Proximal to Olecranon Process  30.4 cm    Olecranon Process  24 cm    15 cm Proximal to Ulnar Styloid Process  22.5 cm    Just Proximal to Ulnar Styloid Process  15.1 cm    Across Hand at [employer]  18.4 cm    At [place] of 2nd Digit  5.5 cm          Quick Dash - 12/25/18 0001    Open a tight or new jar  Mild difficulty    Do heavy household chores (wash walls, wash floors)  Mild difficulty    Carry a shopping bag or briefcase  Mild difficulty  Wash your back  Mild difficulty    Use a knife to cut food  No difficulty    Recreational activities in which you take some force or impact through your arm, shoulder, or hand (golf, hammering, tennis)  Mild difficulty    During the past week, to what extent has your arm, shoulder or hand problem interfered with your normal social activities with family, friends, neighbors, or groups?   Not at all    During the past week, to what extent has your arm, shoulder or hand problem limited your work or other regular daily activities  Not at all    Arm, shoulder, or hand pain.  None    Tingling (pins and needles) in your arm, shoulder, or hand  Mild    Difficulty Sleeping  Mild difficulty    DASH Score  15.91 %        Objective measurements completed on examination: See above findings.              PT Education - 12/25/18 1015    Education Details  anatomy and physiology of lymphatic system, cording, lymphedema risk reduction practices    Person(s) Educated  Patient    Methods  Explanation;Handout    Comprehension  Verbalized understanding          PT Long Term Goals - 12/25/18 1013      PT LONG TERM GOAL #1   Title  Pt will demonstrate 170 degrees of right shoulder flexion to allow her to reach items overhead.    Baseline  153    Time  4    Period  Weeks    Status  New    Target Date  01/22/19      PT LONG TERM GOAL #2   Title  Pt will demonstrate 176 degrees of right shoulder abduction to allow her to reach out to the side    Baseline  173    Time  4    Period  Weeks    Status  New    Target Date  01/22/19      PT LONG TERM GOAL #3   Title  Pt will report a 75% improvement in cording in R upper arm to allow improved comfort when reaching overhead.    Time  4    Period  Weeks    Status  New    Target Date  01/22/19      PT LONG TERM GOAL #4   Title  Pt will be independent in a home exercise program for continued strengthening and stretching    Time  4    Period  Weeks    Status  New    Target Date  01/22/19             Plan - 12/25/18 1006    Clinical Impression Statement  Pt presents to PT with RUE cording following a R mastectomy and SLNB on 11/20/18. She is currently undergoing chemotherapy and will begin radiation next week. She has 4 nodes biopsied and 2 were positive. She has the most difficulty with R UE flexion and feels  pulling from the cording with the motion. She would benefit from skilled PT services to increase R shoulder ROM, decrease cording and progress pt towards a home exercise program with focus on strengthening before she returns to work and has to lift heavy boxes.    Personal Factors and Comorbidities  Profession   has to lift heavy boxes at work  Examination-Activity Limitations  Reach Overhead    Examination-Participation Restrictions  Meal Prep;Cleaning;Community Activity    Stability/Clinical Decision Making  Evolving/Moderate complexity   pt undergoing chemo and about to start radiation   Clinical Decision Making  Moderate    Rehab Potential  Good    PT Frequency  2x / week    PT Duration  4 weeks    PT Treatment/Interventions  ADLs/Self Care Home Management;Therapeutic exercise;Therapeutic activities;Manual techniques;Manual lymph drainage;Patient/family education;Passive range of motion;Taping    PT Next Visit Plan  begin pulleys and ball, myofacial and STM for cording in R upper arm, ROM R shoulder    Consulted and Agree with Plan of Care  Patient       Patient will benefit from skilled therapeutic intervention in order to improve the following deficits and impairments:  Decreased range of motion, Impaired UE functional use, Postural dysfunction, Pain, Decreased strength  Visit Diagnosis: 1. Stiffness of right shoulder, not elsewhere classified   2. Muscle weakness (generalized)   3. Abnormal posture        Problem List Patient Active Problem List   Diagnosis Date Noted  . Neutropenia, drug-induced ([place]) 08/20/2018  . Genetic testing 06/22/2018  . Family history of lung cancer   . Family history of colon cancer   . Family history of pancreatic cancer   . Malignant neoplasm of upper-inner quadrant of right breast in female, estrogen receptor positive ([place]) 04/19/2018    Allyson Sabal [facility] 12/25/2018, 10:17 AM  [place], [place], [address] Phone: [phone]   Fax:  [phone]  Name: Kaliyah Gladman MRN: [id number] Date of Birth: 1985-12-06   Manus Gunning, PT 12/25/18 10:17 AM

## 2018-12-26 ENCOUNTER — Ambulatory Visit: Payer: BC Managed Care – PPO

## 2018-12-26 ENCOUNTER — Other Ambulatory Visit: Payer: Self-pay

## 2018-12-26 ENCOUNTER — Ambulatory Visit: Payer: BC Managed Care – PPO | Admitting: Physical Therapy

## 2018-12-26 ENCOUNTER — Encounter: Payer: Self-pay | Admitting: Physical Therapy

## 2018-12-26 DIAGNOSIS — M6281 Muscle weakness (generalized): Secondary | ICD-10-CM

## 2018-12-26 DIAGNOSIS — M25611 Stiffness of right shoulder, not elsewhere classified: Secondary | ICD-10-CM

## 2018-12-26 DIAGNOSIS — R293 Abnormal posture: Secondary | ICD-10-CM

## 2018-12-26 NOTE — Therapy (Signed)
[place], [place], [address] Phone: [phone]   Fax:  [phone]  Physical Therapy Treatment  Patient Details  Name: Miranda Ayers MRN: [id number] Date of Birth: April 25, 1986 Referring Provider (PT): Perkinds   Encounter Date: 12/26/2018  PT End of Session - 12/26/18 1525    Visit Number  2    Number of Visits  9    Date for PT Re-Evaluation  01/22/19    PT Start Time  1430    PT Stop Time  1515    PT Time Calculation (min)  45 min    Activity Tolerance  Patient tolerated treatment well    Behavior During Therapy  [facility] for tasks assessed/performed       Past Medical History:  Diagnosis Date  . Cancer [facility])    breast cancer  . Family history of colon cancer   . Family history of lung cancer   . Family history of pancreatic cancer   . Hypertension     Past Surgical History:  Procedure Laterality Date  . MASTECTOMY WITH RADIOACTIVE SEED GUIDED EXCISION AND AXILLARY SENTINEL LYMPH NODE BIOPSY Right 11/20/2018   Procedure: RIGHT SIMPLE MASTECTOMY WITH RIGHT RADIOACTIVE SEED TARGETED LYMPH NODE BIOPSY AND RIGHT  AXILLARY SENTINEL LYMPH NODE MAPPING AND PORT REMOVEL RIGHT CHEST;  Surgeon: [name], MD;  Location: [place];  Service: General;  Laterality: Right;  . OOPHORECTOMY  2013  . PORTACATH PLACEMENT N/A 05/08/2018   Procedure: INSERTION PORT-A-CATH WITH ULTRASOUND;  Surgeon: [name], MD;  Location: [place];  Service: General;  Laterality: N/A;    There were no vitals filed for this visit.  Subjective Assessment - 12/26/18 1433    Subjective  My shoulder is feeling pretty good today but still having trouble with the cording.    Pertinent History  11/20/18- right mastectomy for treatment of right breast cancer as well as SLNB (2 of 4 nodes positive), pt is currently undergoing chemo, pt will require radiation and will begin next week    Patient  Stated Goals  get rid of the cording    Currently in Pain?  No/denies    Pain Score  0-No pain                       [facility] Adult PT Treatment/Exercise - 12/26/18 0001      Shoulder Exercises: Supine   Horizontal ABduction  Strengthening;Both;10 reps;Theraband   pt returned therapist demo   Theraband Level (Shoulder Horizontal ABduction)  Level 2 (Red)    External Rotation  Strengthening;Both;10 reps;Theraband   pt returned therapist demo   Theraband Level (Shoulder External Rotation)  Level 2 (Red)    Flexion  Strengthening;Both;10 reps;Theraband   narrow and wide grip, pt returned therapist demo   Theraband Level (Shoulder Flexion)  Level 2 (Red)    Diagonals  Strengthening;Both;10 reps;Theraband   pt returned therapist demo   Theraband Level (Shoulder Diagonals)  Level 2 (Red)      Shoulder Exercises: Pulleys   Flexion  2 minutes    ABduction  2 minutes      Manual Therapy   Manual Therapy  Myofascial release;Passive ROM    Myofascial Release  to cording in R upper arm, by end of session cording could no longer be palpated and was not causing pt discomfort     Passive ROM  very briefly to R shoulder as pt now demonstrates full ROM  PT Long Term Goals - 12/25/18 1013      PT LONG TERM GOAL #1   Title  Pt will demonstrate 170 degrees of right shoulder flexion to allow her to reach items overhead.    Baseline  153    Time  4    Period  Weeks    Status  New    Target Date  01/22/19      PT LONG TERM GOAL #2   Title  Pt will demonstrate 176 degrees of right shoulder abduction to allow her to reach out to the side    Baseline  173    Time  4    Period  Weeks    Status  New    Target Date  01/22/19      PT LONG TERM GOAL #3   Title  Pt will report a 75% improvement in cording in R upper arm to allow improved comfort when reaching overhead.    Time  4    Period  Weeks    Status  New    Target Date  01/22/19      PT LONG  TERM GOAL #4   Title  Pt will be independent in a home exercise program for continued strengthening and stretching    Time  4    Period  Weeks    Status  New    Target Date  01/22/19            Plan - 12/26/18 1526    Clinical Impression Statement  Began AAROM exercises today and pt demonstrated increased AROM with these. Performed myofascial release along cording in right upper arm and cording was no longer palpable at end of session and pt could not feel any discomfort. Instructed pt in supine scapular series. Will instruct pt in Strength ABC exercise program at next session.    Stability/Clinical Decision Making  Evolving/Moderate complexity    Rehab Potential  Good    PT Frequency  2x / week    PT Duration  4 weeks    PT Treatment/Interventions  ADLs/Self Care Home Management;Therapeutic exercise;Therapeutic activities;Manual techniques;Manual lymph drainage;Patient/family education;Passive range of motion;Taping    PT Next Visit Plan  instruct in Strength ABC, assess indep with supine scap, pulleys and ball, myofacial and STM for cording in R upper arm, ROM R shoulder    Consulted and Agree with Plan of Care  Patient       Patient will benefit from skilled therapeutic intervention in order to improve the following deficits and impairments:  Decreased range of motion, Impaired UE functional use, Postural dysfunction, Pain, Decreased strength  Visit Diagnosis: 1. Stiffness of right shoulder, not elsewhere classified   2. Muscle weakness (generalized)   3. Abnormal posture        Problem List Patient Active Problem List   Diagnosis Date Noted  . Neutropenia, drug-induced ([place]) 08/20/2018  . Genetic testing 06/22/2018  . Family history of lung cancer   . Family history of colon cancer   . Family history of pancreatic cancer   . Malignant neoplasm of upper-inner quadrant of right breast in female, estrogen receptor positive ([place]) 04/19/2018    Allyson Sabal  [facility] 12/26/2018, 3:30 PM  [place], [place], [address] Phone: [phone]   Fax:  [phone]  Name: Miranda Ayers MRN: [id number] Date of Birth: 05-15-1986  Manus Gunning, PT 12/26/18 3:30 PM

## 2018-12-26 NOTE — Patient Instructions (Signed)

## 2018-12-27 ENCOUNTER — Ambulatory Visit: Payer: BC Managed Care – PPO

## 2018-12-27 NOTE — Assessment & Plan Note (Signed)
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 11/20/2018:Rightmastectomy and axillary lymph node dissection 3.  Since the final path was HER-2 positive she will get 1 year of Herceptin 4.Adjuvant radiation 5.Followed by adjuvant antiestrogen therapy ----------------------------------------------------------- 11/20/2018:Right mastectomy: Residual grade 2 invasive ductal carcinoma multifocal, largest 1.8 cm, intermediate grade DCIS, margins negative, lymphovascular space invasion present, 0/6 lymph nodes negative, ER 60%, PR 60%, HER-2 positive ratio 2.59  Echocardiogram 12/13/2018: EF 55 to 60%: Normal Return to clinic every 3 weeks for Herceptin every 6 weeks of follow-up with me.

## 2018-12-28 ENCOUNTER — Ambulatory Visit: Payer: BC Managed Care – PPO

## 2018-12-28 DIAGNOSIS — C50211 Malignant neoplasm of upper-inner quadrant of right female breast: Secondary | ICD-10-CM | POA: Diagnosis present

## 2018-12-28 DIAGNOSIS — Z170 Estrogen receptor positive status [ER+]: Secondary | ICD-10-CM | POA: Insufficient documentation

## 2018-12-28 DIAGNOSIS — Z510 Encounter for antineoplastic radiation therapy: Secondary | ICD-10-CM | POA: Diagnosis not present

## 2018-12-31 ENCOUNTER — Ambulatory Visit: Payer: BC Managed Care – PPO

## 2018-12-31 ENCOUNTER — Ambulatory Visit
Admission: RE | Admit: 2018-12-31 | Discharge: 2018-12-31 | Disposition: A | Discharge status: Home / Self Care | Payer: BC Managed Care – PPO | Source: Ambulatory Visit | Attending: Radiation Oncology | Admitting: Radiation Oncology

## 2018-12-31 ENCOUNTER — Ambulatory Visit: Payer: BC Managed Care – PPO | Admitting: Physical Therapy

## 2018-12-31 ENCOUNTER — Encounter: Payer: Self-pay | Admitting: Physical Therapy

## 2018-12-31 ENCOUNTER — Other Ambulatory Visit: Payer: Self-pay

## 2018-12-31 DIAGNOSIS — C50211 Malignant neoplasm of upper-inner quadrant of right female breast: Secondary | ICD-10-CM | POA: Diagnosis not present

## 2018-12-31 DIAGNOSIS — M25611 Stiffness of right shoulder, not elsewhere classified: Secondary | ICD-10-CM

## 2018-12-31 DIAGNOSIS — R293 Abnormal posture: Secondary | ICD-10-CM

## 2018-12-31 DIAGNOSIS — M6281 Muscle weakness (generalized): Secondary | ICD-10-CM

## 2018-12-31 NOTE — Therapy (Signed)
[place], [place], [address] Phone: [phone]   Fax:  [phone]  Physical Therapy Treatment  Patient Details  Name: Miranda Ayers MRN: [id number] Date of Birth: 1986-01-01 Referring Provider (PT): Perkinds   Encounter Date: 12/31/2018  PT End of Session - 12/31/18 1520    Visit Number  3    Number of Visits  9    Date for PT Re-Evaluation  01/22/19    PT Start Time  4235    PT Stop Time  1515    PT Time Calculation (min)  41 min    Activity Tolerance  Patient tolerated treatment well    Behavior During Therapy  [facility] for tasks assessed/performed       Past Medical History:  Diagnosis Date  . Cancer [facility])    breast cancer  . Family history of colon cancer   . Family history of lung cancer   . Family history of pancreatic cancer   . Hypertension     Past Surgical History:  Procedure Laterality Date  . MASTECTOMY WITH RADIOACTIVE SEED GUIDED EXCISION AND AXILLARY SENTINEL LYMPH NODE BIOPSY Right 11/20/2018   Procedure: RIGHT SIMPLE MASTECTOMY WITH RIGHT RADIOACTIVE SEED TARGETED LYMPH NODE BIOPSY AND RIGHT  AXILLARY SENTINEL LYMPH NODE MAPPING AND PORT REMOVEL RIGHT CHEST;  Surgeon: [name], MD;  Location: [place];  Service: General;  Laterality: Right;  . OOPHORECTOMY  2013  . PORTACATH PLACEMENT N/A 05/08/2018   Procedure: INSERTION PORT-A-CATH WITH ULTRASOUND;  Surgeon: [name], MD;  Location: [place];  Service: General;  Laterality: N/A;    There were no vitals filed for this visit.  Subjective Assessment - 12/31/18 1436    Subjective  I am still having some tightness at my elbow. I have been doing the band exercises.    Pertinent History  11/20/18- right mastectomy for treatment of right breast cancer as well as SLNB (2 of 4 nodes positive), pt is currently undergoing chemo, pt will require radiation and will begin next week    Patient  Stated Goals  get rid of the cording    Currently in Pain?  No/denies    Pain Score  0-No pain                       [facility] Adult PT Treatment/Exercise - 12/31/18 0001      Exercises   Exercises  Other Exercises    Other Exercises   Instructed pt in entire Strength ABC program. Pt held all stretches for 30 sec bilaterally and performed all exercises x 10 reps using 2 lb weights for UE exercises. Educated pt to do these daily                  PT Long Term Goals - 12/25/18 1013      PT LONG TERM GOAL #1   Title  Pt will demonstrate 170 degrees of right shoulder flexion to allow her to reach items overhead.    Baseline  153    Time  4    Period  Weeks    Status  New    Target Date  01/22/19      PT LONG TERM GOAL #2   Title  Pt will demonstrate 176 degrees of right shoulder abduction to allow her to reach out to the side    Baseline  173    Time  4    Period  Weeks    Status  New    Target Date  01/22/19      PT LONG TERM GOAL #3   Title  Pt will report a 75% improvement in cording in R upper arm to allow improved comfort when reaching overhead.    Time  4    Period  Weeks    Status  New    Target Date  01/22/19      PT LONG TERM GOAL #4   Title  Pt will be independent in a home exercise program for continued strengthening and stretching    Time  4    Period  Weeks    Status  New    Target Date  01/22/19            Plan - 12/31/18 1521    Clinical Impression Statement  Remeasured pt's R shoulder ROM today and it has returned to prior level of function. She has been doing her supine scap exercises daily and does not have any questions about them. Instructed pt in Strength ABC program today using 2 lbs weights, doing all exercises 10 x and holding all stretches for 30 - 60 seconds. Will assess indep with this at next session.    Stability/Clinical Decision Making  Evolving/Moderate complexity    Rehab Potential  Good    PT Frequency  2x /  week    PT Duration  4 weeks    PT Treatment/Interventions  ADLs/Self Care Home Management;Therapeutic exercise;Therapeutic activities;Manual techniques;Manual lymph drainage;Patient/family education;Passive range of motion;Taping    PT Next Visit Plan  assess indep with Strength ABC program, maybe increase to 3lb, assess indep with supine scap, myofascial to cording    Consulted and Agree with Plan of Care  Patient       Patient will benefit from skilled therapeutic intervention in order to improve the following deficits and impairments:  Decreased range of motion, Impaired UE functional use, Postural dysfunction, Pain, Decreased strength  Visit Diagnosis: 1. Stiffness of right shoulder, not elsewhere classified   2. Muscle weakness (generalized)   3. Abnormal posture        Problem List Patient Active Problem List   Diagnosis Date Noted  . Neutropenia, drug-induced ([place]) 08/20/2018  . Genetic testing 06/22/2018  . Family history of lung cancer   . Family history of colon cancer   . Family history of pancreatic cancer   . Malignant neoplasm of upper-inner quadrant of right breast in female, estrogen receptor positive ([place]) 04/19/2018    Allyson Sabal [facility] 12/31/2018, 3:23 PM  [place], [place], [address] Phone: [phone]   Fax:  [phone]  Name: Miranda Ayers MRN: [id number] Date of Birth: 26-Mar-1986  Manus Gunning, PT 12/31/18 3:23 PM

## 2019-01-01 ENCOUNTER — Ambulatory Visit: Payer: BC Managed Care – PPO

## 2019-01-01 ENCOUNTER — Other Ambulatory Visit: Payer: Self-pay

## 2019-01-01 ENCOUNTER — Encounter: Payer: Self-pay | Admitting: *Deleted

## 2019-01-01 ENCOUNTER — Ambulatory Visit
Admission: RE | Admit: 2019-01-01 | Discharge: 2019-01-01 | Disposition: A | Discharge status: Home / Self Care | Payer: BC Managed Care – PPO | Source: Ambulatory Visit | Attending: Radiation Oncology | Admitting: Radiation Oncology

## 2019-01-01 DIAGNOSIS — C50211 Malignant neoplasm of upper-inner quadrant of right female breast: Secondary | ICD-10-CM | POA: Diagnosis not present

## 2019-01-02 ENCOUNTER — Other Ambulatory Visit: Payer: Self-pay

## 2019-01-02 ENCOUNTER — Ambulatory Visit: Payer: BC Managed Care – PPO

## 2019-01-02 ENCOUNTER — Ambulatory Visit
Admission: RE | Admit: 2019-01-02 | Discharge: 2019-01-02 | Disposition: A | Discharge status: Home / Self Care | Payer: BC Managed Care – PPO | Source: Ambulatory Visit | Attending: Radiation Oncology | Admitting: Radiation Oncology

## 2019-01-02 DIAGNOSIS — M25611 Stiffness of right shoulder, not elsewhere classified: Secondary | ICD-10-CM

## 2019-01-02 DIAGNOSIS — M6281 Muscle weakness (generalized): Secondary | ICD-10-CM

## 2019-01-02 DIAGNOSIS — R293 Abnormal posture: Secondary | ICD-10-CM

## 2019-01-02 DIAGNOSIS — C50211 Malignant neoplasm of upper-inner quadrant of right female breast: Secondary | ICD-10-CM | POA: Diagnosis not present

## 2019-01-02 NOTE — Progress Notes (Signed)
Patient Care Team: [name], DO as PCP - General (Family Medicine) [name], MD as Consulting Physician (General Surgery) [name], MD as Consulting Physician (Hematology and Oncology) [name], RN as Oncology Nurse Navigator [name], RN as Oncology Nurse Navigator  DIAGNOSIS:    ICD-10-CM   1. Malignant neoplasm of upper-inner quadrant of right breast in female, estrogen receptor positive ([name])  C50.211    Z17.0     SUMMARY OF ONCOLOGIC HISTORY: Oncology History  Malignant neoplasm of upper-inner quadrant of right breast in female, estrogen receptor positive ([name])  04/17/2018 Initial Diagnosis   Palpable right breast masses UOQ and UIQ with multiple masses 12 to 14 cm, at 1:00 1.6 cm, 1231.8 cm, and o'clock 4.5 cm and at 9:30 position 5 cm: Biopsy revealed IDC with DCIS, grade 3, ER 80%, PR 0%, HER-2 negative, Ki-67 20%; biopsy of the 4.5 cm mass came back as IDC grade 2-3, ER 50%, PR 5%, HER-2 -2+ ratio 1.6, copy #3.2, Ki-67 20%, T2N1 stage IIb   05/09/2018 - 10/08/2018 Neo-Adjuvant Chemotherapy   Neoadjuvant chemotherapy with dose dense Adriamycin and Cytoxan x4 followed by Taxol weekly x12   06/19/2018 Genetic Testing   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  The Multi-Gene Panel offered by [facility] includes sequencing and/or deletion duplication testing of the following 84 genes: AIP, ALK, APC, ATM, AXIN2,BAP1,  BARD1, BLM, BMPR1A, BRCA1, BRCA2, BRIP1, CASR, CDC73, CDH1, CDK4, CDKN1B, CDKN1C, CDKN2A (p14ARF), CDKN2A (p16INK4a), CEBPA, CHEK2, CTNNA1, DICER1, DIS3L2, EGFR (c.2369C>T, p.Thr790Met variant only), EPCAM (Deletion/duplication testing only), FH, FLCN, GATA2, GPC3, GREM1 (Promoter region deletion/duplication testing only), HOXB13 (c.251G>A, p.Gly84Glu), HRAS, KIT, MAX, MEN1, MET, MITF (c.952G>A, p.Glu318Lys variant only), MLH1, MSH2, MSH3, MSH6, MUTYH, NBN, NF1, NF2, NTHL1, PALB2, PDGFRA,  PHOX2B, PMS2, POLD1, POLE, POT1, PRKAR1A, PTCH1, PTEN, RAD50, RAD51C, RAD51D, RB1, RECQL4, RET, RUNX1, SDHAF2, SDHA (sequence changes only), SDHB, SDHC, SDHD, SMAD4, SMARCA4, SMARCB1, SMARCE1, STK11, SUFU, TERC, TERT, TMEM127, TP53, TSC1, TSC2, VHL, WRN and WT1.  The report date is 06/19/2018.   10/11/2018 Breast MRI   Significant decrease in the size of the right axillary adenopathy and right breast masses from 1.2cm to 0.9cm with minimal residual enhancement, and 2.4x1.4cm to 2.3x1.9cm.   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, largest 1.8 cm, intermediate grade DCIS, margins negative, lymphovascular space invasion present, 0/6 lymph nodes negative, ER 60%, PR 60%, HER-2 positive ratio 2.59   11/20/2018 Cancer Staging   Staging form: Breast, AJCC 8th Edition - Pathologic stage from 11/20/2018: Stage IA (pT1c, pN0, cM0, G2, ER+, PR+, HER2+) - Signed by [name], NP on 12/05/2018   12/14/2018 -  Chemotherapy   The patient had trastuzumab (HERCEPTIN) 546 mg in sodium chloride 0.9 % 250 mL chemo infusion, 8 mg/kg = 546 mg, Intravenous,  Once, 1 of 17 cycles Administration: 546 mg (12/14/2018)  for chemotherapy treatment.    01/01/2019 -  Radiation Therapy   Adjuvant XRT to right chest wall     CHIEF COMPLIANT: Herceptin maintenance  INTERVAL HISTORY: Miranda Ayers is a 33 y.o. with above-mentioned history of right breast cancer who completed neoadjuvant chemotherapy and underwent a right mastectomy. She is currently undergoing radiation treatment and adjuvant Herceptin maintenance. She presents to the clinic today for treatment.   REVIEW OF SYSTEMS:   Constitutional: Denies fevers, chills or abnormal weight loss Eyes: Denies blurriness of vision Ears, nose, mouth,  throat, and face: Denies mucositis or sore throat Respiratory: Denies cough, dyspnea or wheezes Cardiovascular: Denies palpitation, chest discomfort  Gastrointestinal: Denies nausea, heartburn or change in bowel habits Skin: Denies abnormal skin rashes Lymphatics: Denies new lymphadenopathy or easy bruising Neurological: Denies numbness, tingling or new weaknesses Behavioral/Psych: Mood is stable, no new changes  Extremities: No lower extremity edema Breast: s/p right mastectomy All other systems were reviewed with the patient and are negative.  I have reviewed the past medical history, past surgical history, social history and family history with the patient and they are unchanged from previous note.  ALLERGIES:  has No Known Allergies.  MEDICATIONS:  Current Outpatient Medications  Medication Sig Dispense Refill  . hydrochlorothiazide (HYDRODIURIL) 12.5 MG tablet Take 12.5 mg by mouth daily.    Marland Kitchen ibuprofen (ADVIL) 800 MG tablet Take 1 tablet (800 mg total) by mouth every 8 (eight) hours as needed. (Patient not taking: Reported on 12/13/2018) 30 tablet 0  . oxyCODONE (OXY IR/ROXICODONE) 5 MG immediate release tablet Take 1 tablet (5 mg total) by mouth every 6 (six) hours as needed for severe pain. (Patient not taking: Reported on 12/13/2018) 15 tablet 0   No current facility-administered medications for this visit.     PHYSICAL EXAMINATION: ECOG PERFORMANCE STATUS: 0 - Asymptomatic  There were no vitals filed for this visit. There were no vitals filed for this visit.  GENERAL: alert, no distress and comfortable SKIN: skin color, texture, turgor are normal, no rashes or significant lesions EYES: normal, Conjunctiva are pink and non-injected, sclera clear OROPHARYNX: no exudate, no erythema and lips, buccal mucosa, and tongue normal  NECK: supple, thyroid normal size, non-tender, without nodularity LYMPH: no palpable lymphadenopathy in the cervical, axillary or inguinal LUNGS: clear to auscultation and percussion with normal breathing effort HEART: regular rate & rhythm and no murmurs and no lower extremity edema ABDOMEN:  abdomen soft, non-tender and normal bowel sounds MUSCULOSKELETAL: no cyanosis of digits and no clubbing  NEURO: alert & oriented x 3 with fluent speech, no focal motor/sensory deficits EXTREMITIES: No lower extremity edema  LABORATORY DATA:  I have reviewed the data as listed CMP Latest Ref Rng & Units 11/16/2018 10/09/2018 10/02/2018  Glucose 70 - 99 mg/dL 82 85 99  BUN 6 - 20 mg/dL 7 5(L) 6  Creatinine 0.44 - 1.00 mg/dL 0.74 0.67 0.72  Sodium 135 - 145 mmol/L 141 140 140  Potassium 3.5 - 5.1 mmol/L 4.8 4.1 3.6  Chloride 98 - 111 mmol/L 105 106 106  CO2 22 - 32 mmol/L _0 Calcium 8.9 - 10.3 mg/dL 10.2 9.7 9.4  Total Protein 6.5 - 8.1 g/dL 7.6 7.4 7.4  Total Bilirubin 0.3 - 1.2 mg/dL 0.8 0.3 0.2(L)  Alkaline Phos 38 - 126 U/L 60 68 69  AST 15 - 41 U/L _1 ALT 0 - 44 U/L _2 Lab Results  Component Value Date   WBC 2.7 (L) 11/16/2018   HGB 13.4 11/16/2018   HCT 41.3 11/16/2018   MCV 89.2 11/16/2018   PLT 207 11/16/2018   NEUTROABS 1.2 (L) 11/16/2018    ASSESSMENT & PLAN:  Malignant neoplasm of upper-inner quadrant of right breast in female, estrogen receptor positive ([place]) 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%, PR 0%,  HER-2 negative, Ki-67 20%; biopsy of the 4.5 cm mass came back as IDC grade 2-3, ER 50%, PR 5%, HER-2 -2+ ratio 1.6, copy #3.2, Ki-67 20%, T2N1 stage IIb  Recommendation: 1.Neoadjuvant chemotherapy with dose dense Adriamycinand Cytoxan x4 followed by Taxol weekly x11 05/09/2018-10/02/2018 2. 11/20/2018:Rightmastectomy and axillary lymph node dissection 3.  Since the final path was HER-2 positive she will get 1 year of Herceptin 4.Adjuvant radiation 5.Followed by adjuvant antiestrogen therapy ----------------------------------------------------------- 11/20/2018:Right  mastectomy: Residual grade 2 invasive ductal carcinoma multifocal, largest 1.8 cm, intermediate grade DCIS, margins negative, lymphovascular space invasion present, 0/6 lymph nodes negative, ER 60%, PR 60%, HER-2 positive ratio 2.59  Echocardiogram 12/13/2018: EF 55 to 60%: Normal Return to clinic every 3 weeks for Herceptin every 6 weeks of follow-up with me.    No orders of the defined types were placed in this encounter.  The patient has a good understanding of the overall plan. she agrees with it. she will call with any problems that may develop before the next visit here.  [name], MD 01/03/2019  Julious Oka Dorshimer am acting as scribe for Dr. [name].  I have reviewed the above documentation for accuracy and completeness, and I agree with the above.

## 2019-01-02 NOTE — Therapy (Signed)
[place], [place], [address] Phone: [phone]   Fax:  [phone]  Physical Therapy Treatment  Patient Details  Name: Miranda Ayers MRN: [id number] Date of Birth: 1985-07-26 Referring Provider (PT): Perkinds   Encounter Date: 01/02/2019  PT End of Session - 01/02/19 1627    Visit Number  4    Number of Visits  9    Date for PT Re-Evaluation  01/22/19    PT Start Time  1536    PT Stop Time  1622    PT Time Calculation (min)  46 min    Activity Tolerance  Patient tolerated treatment well    Behavior During Therapy  [facility] for tasks assessed/performed       Past Medical History:  Diagnosis Date  . Cancer [facility])    breast cancer  . Family history of colon cancer   . Family history of lung cancer   . Family history of pancreatic cancer   . Hypertension     Past Surgical History:  Procedure Laterality Date  . MASTECTOMY WITH RADIOACTIVE SEED GUIDED EXCISION AND AXILLARY SENTINEL LYMPH NODE BIOPSY Right 11/20/2018   Procedure: RIGHT SIMPLE MASTECTOMY WITH RIGHT RADIOACTIVE SEED TARGETED LYMPH NODE BIOPSY AND RIGHT  AXILLARY SENTINEL LYMPH NODE MAPPING AND PORT REMOVEL RIGHT CHEST;  Surgeon: [name], MD;  Location: [place];  Service: General;  Laterality: Right;  . OOPHORECTOMY  2013  . PORTACATH PLACEMENT N/A 05/08/2018   Procedure: INSERTION PORT-A-CATH WITH ULTRASOUND;  Surgeon: [name], MD;  Location: [place];  Service: General;  Laterality: N/A;    There were no vitals filed for this visit.                    [facility] Adult PT Treatment/Exercise - 01/02/19 0001      Self-Care   Self-Care  Other Self-Care Comments    Other Self-Care Comments   Spent majority of session instructing pt in lymphedema risk reductions and infection prevention practices, answering her questions throughout.       Manual Therapy   Myofascial Release   To Rt axilla and upper arm where pt reports feeling tightness, though no cording palpable dimpling is present at end ROM; also UE pulling all done during P/ROM    Passive ROM  Briefly at end of session as pt reported feeling tightness when back at work today with [employer] reaching: In supine to Rt shoulder focusing on abduction and D2             PT Education - 01/02/19 1626    Education Details  Lymphedema risk reduction practices, infection prevention and how to progress exercises safely and slowly to keep risk low    Person(s) Educated  Patient    Methods  Explanation;Handout    Comprehension  Verbalized understanding          PT Long Term Goals - 12/25/18 1013      PT LONG TERM GOAL #1   Title  Pt will demonstrate 170 degrees of right shoulder flexion to allow her to reach items overhead.    Baseline  153    Time  4    Period  Weeks    Status  New    Target Date  01/22/19      PT LONG TERM GOAL #2   Title  Pt will demonstrate 176 degrees of right shoulder abduction to allow her to reach out to the  side    Baseline  173    Time  4    Period  Weeks    Status  New    Target Date  01/22/19      PT LONG TERM GOAL #3   Title  Pt will report a 75% improvement in cording in R upper arm to allow improved comfort when reaching overhead.    Time  4    Period  Weeks    Status  New    Target Date  01/22/19      PT LONG TERM GOAL #4   Title  Pt will be independent in a home exercise program for continued strengthening and stretching    Time  4    Period  Weeks    Status  New    Target Date  01/22/19            Plan - 01/02/19 1627    Clinical Impression Statement  PT reported no problems or questions from Strength ABC program but did have alot of questions about lymphedema so spent more time today going into more detail instructing and educating her about the lymph sysytem, risk reduction practices, and infection education. Also how to safely and slowlt progress  exercises monitoring the limb throughout, and for now to try Strength ABC with weights only about 2x/week for first month.    Personal Factors and Comorbidities  Profession   has to lift heavy boxes at work   Examination-Activity Limitations  Reach Overhead    Examination-Participation Restrictions  Meal Prep;Cleaning;Community Activity    Stability/Clinical Decision Making  Evolving/Moderate complexity    Rehab Potential  Good    PT Frequency  2x / week    PT Duration  4 weeks    PT Treatment/Interventions  ADLs/Self Care Home Management;Therapeutic exercise;Therapeutic activities;Manual techniques;Manual lymph drainage;Patient/family education;Passive range of motion;Taping    PT Next Visit Plan  assess indep with Strength ABC program, assess indep with supine scap, myofascial to cording prn; probable pt will be ready for D/C in next few sessions    PT Home Exercise Plan  Supine scapular series, Strength ABC Program    Consulted and Agree with Plan of Care  Patient       Patient will benefit from skilled therapeutic intervention in order to improve the following deficits and impairments:  Decreased range of motion, Impaired UE functional use, Postural dysfunction, Pain, Decreased strength  Visit Diagnosis: 1. Stiffness of right shoulder, not elsewhere classified   2. Muscle weakness (generalized)   3. Abnormal posture        Problem List Patient Active Problem List   Diagnosis Date Noted  . Neutropenia, drug-induced ([place]) 08/20/2018  . Genetic testing 06/22/2018  . Family history of lung cancer   . Family history of colon cancer   . Family history of pancreatic cancer   . Malignant neoplasm of upper-inner quadrant of right breast in female, estrogen receptor positive ([place]) 04/19/2018    Otelia Limes, PTA 01/02/2019, 4:32 PM  [place], [place], [address] Phone: [phone]   Fax:   [phone]  Name: Eufemia Prindle MRN: [id number] Date of Birth: [birth year]

## 2019-01-03 ENCOUNTER — Ambulatory Visit: Payer: BC Managed Care – PPO

## 2019-01-03 ENCOUNTER — Inpatient Hospital Stay (HOSPITAL_BASED_OUTPATIENT_CLINIC_OR_DEPARTMENT_OTHER): Payer: BC Managed Care – PPO | Admitting: Hematology and Oncology

## 2019-01-03 ENCOUNTER — Other Ambulatory Visit: Payer: Self-pay

## 2019-01-03 ENCOUNTER — Ambulatory Visit
Admission: RE | Admit: 2019-01-03 | Discharge: 2019-01-03 | Disposition: A | Discharge status: Home / Self Care | Payer: BC Managed Care – PPO | Source: Ambulatory Visit | Attending: Radiation Oncology | Admitting: Radiation Oncology

## 2019-01-03 ENCOUNTER — Encounter: Payer: Self-pay | Admitting: *Deleted

## 2019-01-03 ENCOUNTER — Inpatient Hospital Stay: Payer: BC Managed Care – PPO | Attending: Hematology and Oncology

## 2019-01-03 DIAGNOSIS — Z170 Estrogen receptor positive status [ER+]: Secondary | ICD-10-CM | POA: Diagnosis not present

## 2019-01-03 DIAGNOSIS — C50211 Malignant neoplasm of upper-inner quadrant of right female breast: Secondary | ICD-10-CM | POA: Insufficient documentation

## 2019-01-03 DIAGNOSIS — Z5112 Encounter for antineoplastic immunotherapy: Secondary | ICD-10-CM | POA: Diagnosis present

## 2019-01-03 MED ORDER — DIPHENHYDRAMINE HCL 25 MG PO CAPS
50.0000 mg | ORAL_CAPSULE | Freq: Once | ORAL | Status: AC
  Administered 2019-01-03: 50 mg via ORAL

## 2019-01-03 MED ORDER — SODIUM CHLORIDE 0.9 % IV SOLN
Freq: Once | INTRAVENOUS | Status: AC
  Administered 2019-01-03: 11:00:00 via INTRAVENOUS
  Filled 2019-01-03: qty 250

## 2019-01-03 MED ORDER — TRASTUZUMAB CHEMO 150 MG IV SOLR
6.0000 mg/kg | Freq: Once | INTRAVENOUS | Status: AC
  Administered 2019-01-03: 399 mg via INTRAVENOUS
  Filled 2019-01-03: qty 19

## 2019-01-03 MED ORDER — ACETAMINOPHEN 325 MG PO TABS
650.0000 mg | ORAL_TABLET | Freq: Once | ORAL | Status: AC
  Administered 2019-01-03: 650 mg via ORAL

## 2019-01-03 MED ORDER — ACETAMINOPHEN 325 MG PO TABS
ORAL_TABLET | ORAL | Status: AC
  Filled 2019-01-03: qty 2

## 2019-01-03 MED ORDER — DIPHENHYDRAMINE HCL 25 MG PO CAPS
ORAL_CAPSULE | ORAL | Status: AC
  Filled 2019-01-03: qty 2

## 2019-01-03 NOTE — Patient Instructions (Signed)
[facility] Discharge Instructions for Patients Receiving Chemotherapy  Today you received the following chemotherapy agents:  Herceptin  To help prevent nausea and vomiting after your treatment, we encourage you to take your nausea medication as prescribed.   If you develop nausea and vomiting that is not controlled by your nausea medication, call the clinic.   BELOW ARE SYMPTOMS THAT SHOULD BE REPORTED IMMEDIATELY:  *FEVER GREATER THAN 100.5 F  *CHILLS WITH OR WITHOUT FEVER  NAUSEA AND VOMITING THAT IS NOT CONTROLLED WITH YOUR NAUSEA MEDICATION  *UNUSUAL SHORTNESS OF BREATH  *UNUSUAL BRUISING OR BLEEDING  TENDERNESS IN MOUTH AND THROAT WITH OR WITHOUT PRESENCE OF ULCERS  *URINARY PROBLEMS  *BOWEL PROBLEMS  UNUSUAL RASH Items with * indicate a potential emergency and should be followed up as soon as possible.  Feel free to call the clinic should you have any questions or concerns. The clinic phone number is [phone].  Please show the [facility] at check-in to the Emergency Department and triage nurse.   Coronavirus (COVID-19) Are you at risk?  Are you at risk for the Coronavirus (COVID-19)?  To be considered HIGH RISK for Coronavirus (COVID-19), you have to meet the following criteria:  . Traveled to [place], [place], [place], [place] or [place]; or in the [place] to [place], [place], [place], or [place]; and have fever, cough, and shortness of breath within the last 2 weeks of travel OR . Been in close contact with a person diagnosed with COVID-19 within the last 2 weeks and have fever, cough, and shortness of breath . IF YOU DO NOT MEET THESE CRITERIA, YOU ARE CONSIDERED LOW RISK FOR COVID-19.  What to do if you are HIGH RISK for COVID-19?  Marland Kitchen If you are having a medical emergency, call 911. . Seek medical care right away. Before you go to a doctor's office, urgent care or emergency department, call ahead and tell them about your  recent travel, contact with someone diagnosed with COVID-19, and your symptoms. You should receive instructions from your physician's office regarding next steps of care.  . When you arrive at healthcare provider, tell the healthcare staff immediately you have returned from visiting [place], [place], [place], [place] or [place]; or traveled in the [place] to [place], [place], [place], or [place]; in the last two weeks or you have been in close contact with a person diagnosed with COVID-19 in the last 2 weeks.   . Tell the health care staff about your symptoms: fever, cough and shortness of breath. . After you have been seen by a medical provider, you will be either: o Tested for (COVID-19) and discharged home on quarantine except to seek medical care if symptoms worsen, and asked to  - Stay home and avoid contact with others until you get your results (4-5 days)  - Avoid travel on public transportation if possible (such as bus, train, or airplane) or o Sent to the Emergency Department by EMS for evaluation, COVID-19 testing, and possible admission depending on your condition and test results.  What to do if you are LOW RISK for COVID-19?  Reduce your risk of any infection by using the same precautions used for avoiding the common cold or flu:  Marland Kitchen Wash your hands often with soap and warm water for at least 20 seconds.  If soap and water are not readily available, use an alcohol-based hand sanitizer with at least 60% alcohol.  . If coughing  or sneezing, cover your mouth and nose by coughing or sneezing into the elbow areas of your shirt or coat, into a tissue or into your sleeve (not your hands). . Avoid shaking hands with others and consider head nods or verbal greetings only. . Avoid touching your eyes, nose, or mouth with unwashed hands.  . Avoid close contact with people who are sick. . Avoid places or events with large numbers of people in one location, like concerts or sporting  events. . Carefully consider travel plans you have or are making. . If you are planning any travel outside or inside the [place], visit the CDC's Travelers' Health webpage for the latest health notices. . If you have some symptoms but not all symptoms, continue to monitor at home and seek medical attention if your symptoms worsen. . If you are having a medical emergency, call 911.   ADDITIONAL HEALTHCARE OPTIONS FOR PATIENTS   Telehealth / e-Visit: [email or web address]         [facility]: [phone]  [facility]: [phone]                   [facility]: [phone]     

## 2019-01-04 ENCOUNTER — Other Ambulatory Visit: Payer: Self-pay

## 2019-01-04 ENCOUNTER — Ambulatory Visit: Payer: BC Managed Care – PPO

## 2019-01-04 ENCOUNTER — Ambulatory Visit
Admission: RE | Admit: 2019-01-04 | Discharge: 2019-01-04 | Disposition: A | Discharge status: Home / Self Care | Payer: BC Managed Care – PPO | Source: Ambulatory Visit | Attending: Radiation Oncology | Admitting: Radiation Oncology

## 2019-01-04 DIAGNOSIS — Z170 Estrogen receptor positive status [ER+]: Secondary | ICD-10-CM

## 2019-01-04 DIAGNOSIS — C50211 Malignant neoplasm of upper-inner quadrant of right female breast: Secondary | ICD-10-CM | POA: Diagnosis not present

## 2019-01-04 MED ORDER — RADIAPLEXRX EX GEL
Freq: Once | CUTANEOUS | Status: AC
  Administered 2019-01-04: 13:00:00 via TOPICAL

## 2019-01-04 MED ORDER — ALRA NON-METALLIC DEODORANT (RAD-ONC)
1.0000 "application " | Freq: Once | TOPICAL | Status: AC
  Administered 2019-01-04: 1 via TOPICAL

## 2019-01-04 NOTE — Progress Notes (Signed)
Pt here for patient teaching.  Pt given Radiation and You booklet, skin care instructions, Alra deodorant and Radiaplex gel.  Reviewed areas of pertinence such as fatigue, hair loss, skin changes, breast tenderness and breast swelling . Pt able to give teach back of to pat skin, use unscented/gentle soap and use baby wipes,apply Radiaplex bid, avoid applying anything to skin within 4 hours of treatment, avoid wearing an under wire bra and to use an electric razor if they must shave. Pt verbalized understanding of information given and will contact nursing with any questions or concerns.     Miranda Ayers. Leonie Green, BSN

## 2019-01-07 ENCOUNTER — Ambulatory Visit
Admission: RE | Admit: 2019-01-07 | Discharge: 2019-01-07 | Disposition: A | Discharge status: Home / Self Care | Payer: BC Managed Care – PPO | Source: Ambulatory Visit | Attending: Radiation Oncology | Admitting: Radiation Oncology

## 2019-01-07 ENCOUNTER — Ambulatory Visit: Payer: BC Managed Care – PPO

## 2019-01-07 ENCOUNTER — Other Ambulatory Visit: Payer: Self-pay

## 2019-01-07 DIAGNOSIS — C50211 Malignant neoplasm of upper-inner quadrant of right female breast: Secondary | ICD-10-CM | POA: Diagnosis not present

## 2019-01-08 ENCOUNTER — Encounter: Payer: Self-pay | Admitting: Physical Therapy

## 2019-01-08 ENCOUNTER — Ambulatory Visit
Admission: RE | Admit: 2019-01-08 | Discharge: 2019-01-08 | Disposition: A | Discharge status: Home / Self Care | Payer: BC Managed Care – PPO | Source: Ambulatory Visit | Attending: Radiation Oncology | Admitting: Radiation Oncology

## 2019-01-08 ENCOUNTER — Ambulatory Visit: Payer: BC Managed Care – PPO | Admitting: Physical Therapy

## 2019-01-08 ENCOUNTER — Other Ambulatory Visit: Payer: Self-pay

## 2019-01-08 ENCOUNTER — Ambulatory Visit: Payer: BC Managed Care – PPO

## 2019-01-08 DIAGNOSIS — C50211 Malignant neoplasm of upper-inner quadrant of right female breast: Secondary | ICD-10-CM | POA: Diagnosis not present

## 2019-01-08 DIAGNOSIS — M25611 Stiffness of right shoulder, not elsewhere classified: Secondary | ICD-10-CM

## 2019-01-08 NOTE — Therapy (Signed)
[place], [place], [address] Phone: [phone]   Fax:  [phone]  Physical Therapy Treatment  Patient Details  Name: Miranda Ayers MRN: [id number] Date of Birth: 1986/05/27 Referring Provider (PT): Perkinds   Encounter Date: 01/08/2019  PT End of Session - 01/08/19 1635    Visit Number  5    Number of Visits  9    Date for PT Re-Evaluation  01/22/19    PT Start Time  8850    PT Stop Time  1632    PT Time Calculation (min)  39 min    Activity Tolerance  Patient tolerated treatment well    Behavior During Therapy  [facility] for tasks assessed/performed       Past Medical History:  Diagnosis Date  . Cancer [facility])    breast cancer  . Family history of colon cancer   . Family history of lung cancer   . Family history of pancreatic cancer   . Hypertension     Past Surgical History:  Procedure Laterality Date  . MASTECTOMY WITH RADIOACTIVE SEED GUIDED EXCISION AND AXILLARY SENTINEL LYMPH NODE BIOPSY Right 11/20/2018   Procedure: RIGHT SIMPLE MASTECTOMY WITH RIGHT RADIOACTIVE SEED TARGETED LYMPH NODE BIOPSY AND RIGHT  AXILLARY SENTINEL LYMPH NODE MAPPING AND PORT REMOVEL RIGHT CHEST;  Surgeon: [name], MD;  Location: [place];  Service: General;  Laterality: Right;  . OOPHORECTOMY  2013  . PORTACATH PLACEMENT N/A 05/08/2018   Procedure: INSERTION PORT-A-CATH WITH ULTRASOUND;  Surgeon: [name], MD;  Location: [place];  Service: General;  Laterality: N/A;    There were no vitals filed for this visit.  Subjective Assessment - 01/08/19 1555    Subjective  The exercise program is going good. I haven't been able to walk because it is so hot. I still have a little tightness in my upper right arm. Work is going good. I try not to lift anything heavy.    Pertinent History  11/20/18- right mastectomy for treatment of right breast cancer as well as SLNB (2 of 4  nodes positive), pt is currently undergoing chemo, pt will require radiation and will begin next week    Patient Stated Goals  get rid of the cording    Currently in Pain?  No/denies    Pain Score  0-No pain                       [facility] Adult PT Treatment/Exercise - 01/08/19 0001      Manual Therapy   Myofascial Release  To Rt axilla and upper arm where pt reports feeling tightness, at least 3 cords palpable and all 3 released with myofascial                  PT Long Term Goals - 01/08/19 1557      PT LONG TERM GOAL #1   Title  Pt will demonstrate 170 degrees of right shoulder flexion to allow her to reach items overhead.    Baseline  153, 01/08/19- 175    Time  4    Period  Weeks    Status  Achieved      PT LONG TERM GOAL #2   Title  Pt will demonstrate 176 degrees of right shoulder abduction to allow her to reach out to the side    Baseline  173. 7.21/20- 180    Time  4    Period  Weeks    Status  Achieved      PT LONG TERM GOAL #3   Title  Pt will report a 75% improvement in cording in R upper arm to allow improved comfort when reaching overhead.    Baseline  01/08/19- 80% improvement    Time  4    Period  Weeks    Status  Achieved      PT LONG TERM GOAL #4   Title  Pt will be independent in a home exercise program for continued strengthening and stretching    Baseline  01/08/19- pt is indepedent with strength ABC program and supine scap    Time  4    Period  Weeks    Status  Achieved      PT LONG TERM GOAL #5   Title  Pt will demonstrate no cording in R upper arm to allow improved comfort    Baseline  01/08/19- 3 cords released this session, unable to palpate more, will check again next session    Time  2    Period  Weeks    Status  New    Target Date  01/22/19            Plan - 01/08/19 1635    Clinical Impression Statement  Pt did not have any questions about her exercise program. Her only complaint today was the tightness in her  R upper arm. Focused on myofascial release to this area today and 3 cords released. Will reassess for development of any more cords on Thursday and then most likely pt will be ready to discharge at that session.    Rehab Potential  Good    PT Frequency  2x / week    PT Duration  4 weeks    PT Treatment/Interventions  ADLs/Self Care Home Management;Therapeutic exercise;Therapeutic activities;Manual techniques;Manual lymph drainage;Patient/family education;Passive range of motion;Taping    PT Next Visit Plan  myofascial to cording    PT Home Exercise Plan  Supine scapular series, Strength ABC Program    Consulted and Agree with Plan of Care  Patient       Patient will benefit from skilled therapeutic intervention in order to improve the following deficits and impairments:  Decreased range of motion, Impaired UE functional use, Postural dysfunction, Pain, Decreased strength  Visit Diagnosis: 1. Stiffness of right shoulder, not elsewhere classified        Problem List Patient Active Problem List   Diagnosis Date Noted  . Neutropenia, drug-induced ([place]) 08/20/2018  . Genetic testing 06/22/2018  . Family history of lung cancer   . Family history of colon cancer   . Family history of pancreatic cancer   . Malignant neoplasm of upper-inner quadrant of right breast in female, estrogen receptor positive ([place]) 04/19/2018    Allyson Sabal [facility] 01/08/2019, 4:38 PM  [place], [place], [address] Phone: [phone]   Fax:  [phone]  Name: Miranda Ayers MRN: [id number] Date of Birth: 1986-02-23  Manus Gunning, PT 01/08/19 4:38 PM

## 2019-01-09 ENCOUNTER — Other Ambulatory Visit: Payer: Self-pay

## 2019-01-09 ENCOUNTER — Ambulatory Visit: Payer: BC Managed Care – PPO

## 2019-01-09 ENCOUNTER — Ambulatory Visit
Admission: RE | Admit: 2019-01-09 | Discharge: 2019-01-09 | Disposition: A | Discharge status: Home / Self Care | Payer: BC Managed Care – PPO | Source: Ambulatory Visit | Attending: Radiation Oncology | Admitting: Radiation Oncology

## 2019-01-09 DIAGNOSIS — C50211 Malignant neoplasm of upper-inner quadrant of right female breast: Secondary | ICD-10-CM | POA: Diagnosis not present

## 2019-01-10 ENCOUNTER — Ambulatory Visit: Payer: BC Managed Care – PPO

## 2019-01-10 ENCOUNTER — Encounter: Payer: Self-pay | Admitting: Physical Therapy

## 2019-01-10 ENCOUNTER — Ambulatory Visit: Payer: BC Managed Care – PPO | Admitting: Physical Therapy

## 2019-01-10 ENCOUNTER — Other Ambulatory Visit: Payer: Self-pay

## 2019-01-10 ENCOUNTER — Ambulatory Visit
Admission: RE | Admit: 2019-01-10 | Discharge: 2019-01-10 | Disposition: A | Discharge status: Home / Self Care | Payer: BC Managed Care – PPO | Source: Ambulatory Visit | Attending: Radiation Oncology | Admitting: Radiation Oncology

## 2019-01-10 DIAGNOSIS — C50211 Malignant neoplasm of upper-inner quadrant of right female breast: Secondary | ICD-10-CM | POA: Diagnosis not present

## 2019-01-10 DIAGNOSIS — M25611 Stiffness of right shoulder, not elsewhere classified: Secondary | ICD-10-CM

## 2019-01-10 NOTE — Therapy (Signed)
[place], [place], [address] Phone: [phone]   Fax:  [phone]  Physical Therapy Treatment  Patient Details  Name: Mckaila Duffus MRN: [id number] Date of Birth: 14-Apr-1986 Referring Provider (PT): Dara Lords   Encounter Date: 01/10/2019  PT End of Session - 01/10/19 1630    Visit Number  6    Number of Visits  9    Date for PT Re-Evaluation  01/22/19    PT Start Time  3267    PT Stop Time  1625    PT Time Calculation (min)  30 min    Activity Tolerance  Patient tolerated treatment well    Behavior During Therapy  [facility] for tasks assessed/performed       Past Medical History:  Diagnosis Date  . Cancer [facility])    breast cancer  . Family history of colon cancer   . Family history of lung cancer   . Family history of pancreatic cancer   . Hypertension     Past Surgical History:  Procedure Laterality Date  . MASTECTOMY WITH RADIOACTIVE SEED GUIDED EXCISION AND AXILLARY SENTINEL LYMPH NODE BIOPSY Right 11/20/2018   Procedure: RIGHT SIMPLE MASTECTOMY WITH RIGHT RADIOACTIVE SEED TARGETED LYMPH NODE BIOPSY AND RIGHT  AXILLARY SENTINEL LYMPH NODE MAPPING AND PORT REMOVEL RIGHT CHEST;  Surgeon: [name], MD;  Location: [place];  Service: General;  Laterality: Right;  . OOPHORECTOMY  2013  . PORTACATH PLACEMENT N/A 05/08/2018   Procedure: INSERTION PORT-A-CATH WITH ULTRASOUND;  Surgeon: [name], MD;  Location: [place];  Service: General;  Laterality: N/A;    There were no vitals filed for this visit.  Subjective Assessment - 01/10/19 1556    Subjective  I am not having any tightness in my arm. The cording did not come back.    Pertinent History  11/20/18- right mastectomy for treatment of right breast cancer as well as SLNB (2 of 4 nodes positive), pt is currently undergoing chemo, pt will require radiation and will begin next week    Patient Stated Goals   get rid of the cording    Currently in Pain?  No/denies    Pain Score  0-No pain                       [facility] Adult PT Treatment/Exercise - 01/10/19 0001      Manual Therapy   Myofascial Release  To Rt axilla and upper arm where pt reports feeling tightness, at least 3 cords palpable and 2 cords released with myofascial, unable to palpate any cords at end of session                  PT Long Term Goals - 01/10/19 1629      PT LONG TERM GOAL #1   Title  Pt will demonstrate 170 degrees of right shoulder flexion to allow her to reach items overhead.    Baseline  153, 01/08/19- 175    Time  4    Period  Weeks    Status  Achieved      PT LONG TERM GOAL #2   Title  Pt will demonstrate 176 degrees of right shoulder abduction to allow her to reach out to the side    Baseline  173. 7.21/20- 180    Time  4    Period  Weeks    Status  Achieved      PT LONG  TERM GOAL #3   Title  Pt will report a 75% improvement in cording in R upper arm to allow improved comfort when reaching overhead.    Baseline  01/08/19- 80% improvement    Time  4    Period  Weeks    Status  Achieved      PT LONG TERM GOAL #4   Title  Pt will be independent in a home exercise program for continued strengthening and stretching    Baseline  01/08/19- pt is indepedent with strength ABC program and supine scap    Time  4    Period  Weeks    Status  Achieved      PT LONG TERM GOAL #5   Title  Pt will demonstrate no cording in R upper arm to allow improved comfort    Baseline  01/08/19- 3 cords released this session, unable to palpate more, will check again next session 01/10/19- pt did not feel any tightness with cording at beginning of session, 2 cord release during session and no others palpable    Time  2    Period  Weeks    Status  Achieved            Plan - 01/10/19 1631    Clinical Impression Statement  Pt did not have any tightness after last session in her axilla. Upon palpation  several cords could be palpated and 2 released with myofascial but these did not limit pt's ROM. Pt is ready for discharge at this time as all goals have been met.    Stability/Clinical Decision Making  Evolving/Moderate complexity    Rehab Potential  Good    PT Frequency  2x / week    PT Duration  4 weeks    PT Treatment/Interventions  ADLs/Self Care Home Management;Therapeutic exercise;Therapeutic activities;Manual techniques;Manual lymph drainage;Patient/family education;Passive range of motion;Taping    PT Next Visit Plan  d/c this visit    PT Home Exercise Plan  Supine scapular series, Strength ABC Program    Consulted and Agree with Plan of Care  Patient       Patient will benefit from skilled therapeutic intervention in order to improve the following deficits and impairments:  Decreased range of motion, Impaired UE functional use, Postural dysfunction, Pain, Decreased strength  Visit Diagnosis: 1. Stiffness of right shoulder, not elsewhere classified        Problem List Patient Active Problem List   Diagnosis Date Noted  . Neutropenia, drug-induced ([place]) 08/20/2018  . Genetic testing 06/22/2018  . Family history of lung cancer   . Family history of colon cancer   . Family history of pancreatic cancer   . Malignant neoplasm of upper-inner quadrant of right breast in female, estrogen receptor positive ([place]) 04/19/2018    Allyson Sabal [facility] 01/10/2019, 4:33 PM  [place], [place], [address] Phone: [phone]   Fax:  [phone]  Name: Tish Begin MRN: [id number] Date of Birth: [birth year]  PHYSICAL THERAPY DISCHARGE SUMMARY  Visits from Start of Care: 6  Current functional level related to goals / functional outcomes: All goals met   Remaining deficits: None   Education / Equipment: HEP  Plan: Patient agrees to discharge.  Patient goals were met. Patient is being discharged  due to meeting the stated rehab goals.  ?????    Allyson Sabal [place], [place] 01/10/19 4:33 PM

## 2019-01-11 ENCOUNTER — Ambulatory Visit: Payer: BC Managed Care – PPO

## 2019-01-11 ENCOUNTER — Ambulatory Visit
Admission: RE | Admit: 2019-01-11 | Discharge: 2019-01-11 | Disposition: A | Discharge status: Home / Self Care | Payer: BC Managed Care – PPO | Source: Ambulatory Visit | Attending: Radiation Oncology | Admitting: Radiation Oncology

## 2019-01-11 ENCOUNTER — Other Ambulatory Visit: Payer: Self-pay

## 2019-01-11 DIAGNOSIS — C50211 Malignant neoplasm of upper-inner quadrant of right female breast: Secondary | ICD-10-CM | POA: Diagnosis not present

## 2019-01-14 ENCOUNTER — Ambulatory Visit: Payer: BC Managed Care – PPO

## 2019-01-14 ENCOUNTER — Other Ambulatory Visit: Payer: Self-pay

## 2019-01-14 ENCOUNTER — Ambulatory Visit
Admission: RE | Admit: 2019-01-14 | Discharge: 2019-01-14 | Disposition: A | Discharge status: Home / Self Care | Payer: BC Managed Care – PPO | Source: Ambulatory Visit | Attending: Radiation Oncology | Admitting: Radiation Oncology

## 2019-01-14 DIAGNOSIS — C50211 Malignant neoplasm of upper-inner quadrant of right female breast: Secondary | ICD-10-CM | POA: Diagnosis not present

## 2019-01-15 ENCOUNTER — Other Ambulatory Visit: Payer: Self-pay

## 2019-01-15 ENCOUNTER — Ambulatory Visit: Payer: BC Managed Care – PPO

## 2019-01-15 ENCOUNTER — Encounter: Payer: BC Managed Care – PPO | Admitting: Physical Therapy

## 2019-01-15 ENCOUNTER — Ambulatory Visit
Admission: RE | Admit: 2019-01-15 | Discharge: 2019-01-15 | Disposition: A | Discharge status: Home / Self Care | Payer: BC Managed Care – PPO | Source: Ambulatory Visit | Attending: Radiation Oncology | Admitting: Radiation Oncology

## 2019-01-15 DIAGNOSIS — C50211 Malignant neoplasm of upper-inner quadrant of right female breast: Secondary | ICD-10-CM | POA: Diagnosis not present

## 2019-01-16 ENCOUNTER — Ambulatory Visit: Payer: BC Managed Care – PPO

## 2019-01-16 ENCOUNTER — Ambulatory Visit
Admission: RE | Admit: 2019-01-16 | Discharge: 2019-01-16 | Disposition: A | Discharge status: Home / Self Care | Payer: BC Managed Care – PPO | Source: Ambulatory Visit | Attending: Radiation Oncology | Admitting: Radiation Oncology

## 2019-01-16 ENCOUNTER — Other Ambulatory Visit: Payer: Self-pay

## 2019-01-16 DIAGNOSIS — C50211 Malignant neoplasm of upper-inner quadrant of right female breast: Secondary | ICD-10-CM | POA: Diagnosis not present

## 2019-01-17 ENCOUNTER — Ambulatory Visit: Payer: BC Managed Care – PPO

## 2019-01-17 ENCOUNTER — Encounter: Payer: BC Managed Care – PPO | Admitting: Physical Therapy

## 2019-01-17 ENCOUNTER — Ambulatory Visit
Admission: RE | Admit: 2019-01-17 | Discharge: 2019-01-17 | Disposition: A | Discharge status: Home / Self Care | Payer: BC Managed Care – PPO | Source: Ambulatory Visit | Attending: Radiation Oncology | Admitting: Radiation Oncology

## 2019-01-17 ENCOUNTER — Other Ambulatory Visit: Payer: Self-pay

## 2019-01-17 DIAGNOSIS — C50211 Malignant neoplasm of upper-inner quadrant of right female breast: Secondary | ICD-10-CM | POA: Diagnosis not present

## 2019-01-18 ENCOUNTER — Ambulatory Visit: Payer: BC Managed Care – PPO

## 2019-01-18 ENCOUNTER — Other Ambulatory Visit: Payer: Self-pay

## 2019-01-18 ENCOUNTER — Ambulatory Visit
Admission: RE | Admit: 2019-01-18 | Discharge: 2019-01-18 | Disposition: A | Discharge status: Home / Self Care | Payer: BC Managed Care – PPO | Source: Ambulatory Visit | Attending: Radiation Oncology | Admitting: Radiation Oncology

## 2019-01-18 DIAGNOSIS — C50211 Malignant neoplasm of upper-inner quadrant of right female breast: Secondary | ICD-10-CM | POA: Diagnosis not present

## 2019-01-21 ENCOUNTER — Ambulatory Visit: Payer: BC Managed Care – PPO

## 2019-01-21 ENCOUNTER — Ambulatory Visit
Admission: RE | Admit: 2019-01-21 | Discharge: 2019-01-21 | Disposition: A | Discharge status: Home / Self Care | Payer: BC Managed Care – PPO | Source: Ambulatory Visit | Attending: Radiation Oncology | Admitting: Radiation Oncology

## 2019-01-21 ENCOUNTER — Other Ambulatory Visit: Payer: Self-pay

## 2019-01-21 DIAGNOSIS — Z170 Estrogen receptor positive status [ER+]: Secondary | ICD-10-CM | POA: Diagnosis not present

## 2019-01-21 DIAGNOSIS — Z510 Encounter for antineoplastic radiation therapy: Secondary | ICD-10-CM | POA: Insufficient documentation

## 2019-01-21 DIAGNOSIS — C50211 Malignant neoplasm of upper-inner quadrant of right female breast: Secondary | ICD-10-CM | POA: Diagnosis present

## 2019-01-22 ENCOUNTER — Encounter: Payer: BC Managed Care – PPO | Admitting: Physical Therapy

## 2019-01-22 ENCOUNTER — Other Ambulatory Visit: Payer: Self-pay

## 2019-01-22 ENCOUNTER — Ambulatory Visit: Payer: BC Managed Care – PPO

## 2019-01-22 ENCOUNTER — Ambulatory Visit
Admission: RE | Admit: 2019-01-22 | Discharge: 2019-01-22 | Disposition: A | Discharge status: Home / Self Care | Payer: BC Managed Care – PPO | Source: Ambulatory Visit | Attending: Radiation Oncology | Admitting: Radiation Oncology

## 2019-01-22 DIAGNOSIS — C50211 Malignant neoplasm of upper-inner quadrant of right female breast: Secondary | ICD-10-CM | POA: Diagnosis not present

## 2019-01-22 NOTE — Progress Notes (Signed)
The following biosimilar Ogivri (trastuzumab-dkst) has been selected for use in this patient.  Kennith Center, Pharm.D., CPP 01/22/2019@3 :53 PM

## 2019-01-23 ENCOUNTER — Ambulatory Visit: Payer: BC Managed Care – PPO

## 2019-01-23 ENCOUNTER — Ambulatory Visit
Admission: RE | Admit: 2019-01-23 | Discharge: 2019-01-23 | Disposition: A | Discharge status: Home / Self Care | Payer: BC Managed Care – PPO | Source: Ambulatory Visit | Attending: Radiation Oncology | Admitting: Radiation Oncology

## 2019-01-23 ENCOUNTER — Other Ambulatory Visit: Payer: Self-pay

## 2019-01-23 DIAGNOSIS — C50211 Malignant neoplasm of upper-inner quadrant of right female breast: Secondary | ICD-10-CM | POA: Diagnosis not present

## 2019-01-24 ENCOUNTER — Ambulatory Visit
Admission: RE | Admit: 2019-01-24 | Discharge: 2019-01-24 | Disposition: A | Discharge status: Home / Self Care | Payer: BC Managed Care – PPO | Source: Ambulatory Visit | Attending: Radiation Oncology | Admitting: Radiation Oncology

## 2019-01-24 ENCOUNTER — Ambulatory Visit: Payer: BC Managed Care – PPO

## 2019-01-24 ENCOUNTER — Inpatient Hospital Stay: Payer: BC Managed Care – PPO | Attending: Hematology and Oncology

## 2019-01-24 ENCOUNTER — Other Ambulatory Visit: Payer: Self-pay

## 2019-01-24 VITALS — BP 148/104 | HR 92 | Temp 98.0°F | Resp 16 | Wt 147.0 lb

## 2019-01-24 DIAGNOSIS — Z170 Estrogen receptor positive status [ER+]: Secondary | ICD-10-CM | POA: Insufficient documentation

## 2019-01-24 DIAGNOSIS — Z5112 Encounter for antineoplastic immunotherapy: Secondary | ICD-10-CM | POA: Insufficient documentation

## 2019-01-24 DIAGNOSIS — C50211 Malignant neoplasm of upper-inner quadrant of right female breast: Secondary | ICD-10-CM | POA: Insufficient documentation

## 2019-01-24 MED ORDER — DIPHENHYDRAMINE HCL 25 MG PO CAPS
50.0000 mg | ORAL_CAPSULE | Freq: Once | ORAL | Status: AC
  Administered 2019-01-24: 50 mg via ORAL

## 2019-01-24 MED ORDER — ACETAMINOPHEN 325 MG PO TABS
650.0000 mg | ORAL_TABLET | Freq: Once | ORAL | Status: AC
  Administered 2019-01-24: 650 mg via ORAL

## 2019-01-24 MED ORDER — TRASTUZUMAB CHEMO 150 MG IV SOLR
6.0000 mg/kg | Freq: Once | INTRAVENOUS | Status: AC
  Administered 2019-01-24: 399 mg via INTRAVENOUS
  Filled 2019-01-24: qty 19

## 2019-01-24 MED ORDER — ACETAMINOPHEN 325 MG PO TABS
ORAL_TABLET | ORAL | Status: AC
  Filled 2019-01-24: qty 2

## 2019-01-24 MED ORDER — SODIUM CHLORIDE 0.9 % IV SOLN
Freq: Once | INTRAVENOUS | Status: AC
  Administered 2019-01-24: 09:00:00 via INTRAVENOUS
  Filled 2019-01-24: qty 250

## 2019-01-24 MED ORDER — DIPHENHYDRAMINE HCL 25 MG PO CAPS
ORAL_CAPSULE | ORAL | Status: AC
  Filled 2019-01-24: qty 2

## 2019-01-24 NOTE — Progress Notes (Signed)
Per Dr. Lindi Adie, okay to treat with BP 148/104

## 2019-01-24 NOTE — Patient Instructions (Signed)
[facility] Discharge Instructions for Patients Receiving Chemotherapy  Today you received the following chemotherapy agents:  Herceptin  To help prevent nausea and vomiting after your treatment, we encourage you to take your nausea medication as prescribed.   If you develop nausea and vomiting that is not controlled by your nausea medication, call the clinic.   BELOW ARE SYMPTOMS THAT SHOULD BE REPORTED IMMEDIATELY:  *FEVER GREATER THAN 100.5 F  *CHILLS WITH OR WITHOUT FEVER  NAUSEA AND VOMITING THAT IS NOT CONTROLLED WITH YOUR NAUSEA MEDICATION  *UNUSUAL SHORTNESS OF BREATH  *UNUSUAL BRUISING OR BLEEDING  TENDERNESS IN MOUTH AND THROAT WITH OR WITHOUT PRESENCE OF ULCERS  *URINARY PROBLEMS  *BOWEL PROBLEMS  UNUSUAL RASH Items with * indicate a potential emergency and should be followed up as soon as possible.  Feel free to call the clinic should you have any questions or concerns. The clinic phone number is [phone].  Please show the [facility] at check-in to the Emergency Department and triage nurse.   Coronavirus (COVID-19) Are you at risk?  Are you at risk for the Coronavirus (COVID-19)?  To be considered HIGH RISK for Coronavirus (COVID-19), you have to meet the following criteria:  . Traveled to [place], [place], [place], [place] or [place]; or in the [place] to [place], [place], [place], or [place]; and have fever, cough, and shortness of breath within the last 2 weeks of travel OR . Been in close contact with a person diagnosed with COVID-19 within the last 2 weeks and have fever, cough, and shortness of breath . IF YOU DO NOT MEET THESE CRITERIA, YOU ARE CONSIDERED LOW RISK FOR COVID-19.  What to do if you are HIGH RISK for COVID-19?  Marland Kitchen If you are having a medical emergency, call 911. . Seek medical care right away. Before you go to a doctor's office, urgent care or emergency department, call ahead and tell them about your  recent travel, contact with someone diagnosed with COVID-19, and your symptoms. You should receive instructions from your physician's office regarding next steps of care.  . When you arrive at healthcare provider, tell the healthcare staff immediately you have returned from visiting [place], [place], [place], [place] or [place]; or traveled in the [place] to [place], [place], [place], or [place]; in the last two weeks or you have been in close contact with a person diagnosed with COVID-19 in the last 2 weeks.   . Tell the health care staff about your symptoms: fever, cough and shortness of breath. . After you have been seen by a medical provider, you will be either: o Tested for (COVID-19) and discharged home on quarantine except to seek medical care if symptoms worsen, and asked to  - Stay home and avoid contact with others until you get your results (4-5 days)  - Avoid travel on public transportation if possible (such as bus, train, or airplane) or o Sent to the Emergency Department by EMS for evaluation, COVID-19 testing, and possible admission depending on your condition and test results.  What to do if you are LOW RISK for COVID-19?  Reduce your risk of any infection by using the same precautions used for avoiding the common cold or flu:  Marland Kitchen Wash your hands often with soap and warm water for at least 20 seconds.  If soap and water are not readily available, use an alcohol-based hand sanitizer with at least 60% alcohol.  . If coughing  or sneezing, cover your mouth and nose by coughing or sneezing into the elbow areas of your shirt or coat, into a tissue or into your sleeve (not your hands). . Avoid shaking hands with others and consider head nods or verbal greetings only. . Avoid touching your eyes, nose, or mouth with unwashed hands.  . Avoid close contact with people who are sick. . Avoid places or events with large numbers of people in one location, like concerts or sporting  events. . Carefully consider travel plans you have or are making. . If you are planning any travel outside or inside the US, visit the CDC's Travelers' Health webpage for the latest health notices. . If you have some symptoms but not all symptoms, continue to monitor at home and seek medical attention if your symptoms worsen. . If you are having a medical emergency, call 911.   ADDITIONAL HEALTHCARE OPTIONS FOR PATIENTS   Telehealth / e-Visit: [email or web address]         [facility]: [phone]  [facility]: [phone]                   [facility]: [phone]     

## 2019-01-25 ENCOUNTER — Ambulatory Visit: Payer: BC Managed Care – PPO

## 2019-01-25 ENCOUNTER — Other Ambulatory Visit: Payer: Self-pay

## 2019-01-25 ENCOUNTER — Ambulatory Visit
Admission: RE | Admit: 2019-01-25 | Discharge: 2019-01-25 | Disposition: A | Discharge status: Home / Self Care | Payer: BC Managed Care – PPO | Source: Ambulatory Visit | Attending: Radiation Oncology | Admitting: Radiation Oncology

## 2019-01-25 ENCOUNTER — Encounter: Payer: BC Managed Care – PPO | Admitting: Physical Therapy

## 2019-01-25 DIAGNOSIS — C50211 Malignant neoplasm of upper-inner quadrant of right female breast: Secondary | ICD-10-CM | POA: Diagnosis not present

## 2019-01-28 ENCOUNTER — Ambulatory Visit: Payer: BC Managed Care – PPO

## 2019-01-28 ENCOUNTER — Other Ambulatory Visit: Payer: Self-pay

## 2019-01-28 ENCOUNTER — Ambulatory Visit
Admission: RE | Admit: 2019-01-28 | Discharge: 2019-01-28 | Disposition: A | Discharge status: Home / Self Care | Payer: BC Managed Care – PPO | Source: Ambulatory Visit | Attending: Radiation Oncology | Admitting: Radiation Oncology

## 2019-01-28 DIAGNOSIS — C50211 Malignant neoplasm of upper-inner quadrant of right female breast: Secondary | ICD-10-CM | POA: Diagnosis not present

## 2019-01-29 ENCOUNTER — Ambulatory Visit: Payer: BC Managed Care – PPO

## 2019-01-29 ENCOUNTER — Other Ambulatory Visit: Payer: Self-pay

## 2019-01-29 ENCOUNTER — Ambulatory Visit
Admission: RE | Admit: 2019-01-29 | Discharge: 2019-01-29 | Disposition: A | Discharge status: Home / Self Care | Payer: BC Managed Care – PPO | Source: Ambulatory Visit | Attending: Radiation Oncology | Admitting: Radiation Oncology

## 2019-01-29 DIAGNOSIS — C50211 Malignant neoplasm of upper-inner quadrant of right female breast: Secondary | ICD-10-CM | POA: Diagnosis not present

## 2019-01-30 ENCOUNTER — Ambulatory Visit: Payer: BC Managed Care – PPO

## 2019-01-30 ENCOUNTER — Other Ambulatory Visit: Payer: Self-pay

## 2019-01-30 ENCOUNTER — Ambulatory Visit
Admission: RE | Admit: 2019-01-30 | Discharge: 2019-01-30 | Disposition: A | Discharge status: Home / Self Care | Payer: BC Managed Care – PPO | Source: Ambulatory Visit | Attending: Radiation Oncology | Admitting: Radiation Oncology

## 2019-01-30 DIAGNOSIS — C50211 Malignant neoplasm of upper-inner quadrant of right female breast: Secondary | ICD-10-CM | POA: Diagnosis not present

## 2019-01-31 ENCOUNTER — Ambulatory Visit: Payer: BC Managed Care – PPO

## 2019-01-31 ENCOUNTER — Ambulatory Visit
Admission: RE | Admit: 2019-01-31 | Discharge: 2019-01-31 | Disposition: A | Discharge status: Home / Self Care | Payer: BC Managed Care – PPO | Source: Ambulatory Visit | Attending: Radiation Oncology | Admitting: Radiation Oncology

## 2019-01-31 ENCOUNTER — Other Ambulatory Visit: Payer: Self-pay

## 2019-01-31 DIAGNOSIS — C50211 Malignant neoplasm of upper-inner quadrant of right female breast: Secondary | ICD-10-CM | POA: Diagnosis not present

## 2019-02-01 ENCOUNTER — Ambulatory Visit: Payer: BC Managed Care – PPO

## 2019-02-01 ENCOUNTER — Other Ambulatory Visit: Payer: Self-pay

## 2019-02-01 ENCOUNTER — Ambulatory Visit
Admission: RE | Admit: 2019-02-01 | Discharge: 2019-02-01 | Disposition: A | Discharge status: Home / Self Care | Payer: BC Managed Care – PPO | Source: Ambulatory Visit | Attending: Radiation Oncology | Admitting: Radiation Oncology

## 2019-02-01 DIAGNOSIS — C50211 Malignant neoplasm of upper-inner quadrant of right female breast: Secondary | ICD-10-CM | POA: Diagnosis not present

## 2019-02-01 NOTE — Progress Notes (Signed)
  Radiation Oncology         [phone] ________________________________  Name: Miranda Ayers MRN: [id number]  Date: 12/13/2018  DOB: 1986/06/07  DIAGNOSIS:     ICD-10-CM   1. Malignant neoplasm of upper-inner quadrant of right breast in female, estrogen receptor positive ([facility])  C50.211    Z17.0      SIMULATION AND TREATMENT PLANNING NOTE  The patient presented for simulation prior to beginning her course of radiation treatment for her diagnosis of right-sided breast cancer. The patient was placed in a supine position on a breast board. A customized vac-lock bag was also constructed and this complex treatment device will be used on a daily basis during her treatment. In this fashion, a CT scan was obtained through the chest area and an isocenter was placed near the chest wall at the upper aspect of the right chest.  The patient will be planned to receive a course of radiation initially to a dose of 50.4 gray. This will consist of a 4 field technique targeting the right chest wall as well as the supraclavicular region. Therefore 2 customized medial and lateral tangent fields have been created targeting the chest wall, and also 2 additional customized fields have been designed to treat the supraclavicular region both with a right supraclavicular field and a right posterior axillary boost field. A forward planning/reduced field technique will also be evaluated to determine if this significantly improves the dose homogeneity of the overall plan. Therefore, additional customized blocks/fields may be necessary.  This initial treatment will be accomplished at 1.8 gray per fraction.   The initial plan will consist of a 3-D conformal technique. The target volume/scar, heart and lungs have been contoured and dose volume histograms of each of these structures will be evaluated as part of the 3-D conformal treatment planning process.   It is anticipated that the patient will then receive a 10 gray boost  to the surgical scar. This will be accomplished at 2 gray per fraction. The final anticipated total dose therefore will correspond to 60.4 gray.    _______________________________   Jodelle Gross, MD, PhD

## 2019-02-01 NOTE — Progress Notes (Signed)
  Radiation Oncology         [phone] ________________________________  Name: Rashiya Lofland MRN: [id number]  Date: 12/13/2018  DOB: 01/16/1986  Optical Surface Tracking Plan:  Since intensity modulated radiotherapy (IMRT) and 3D conformal radiation treatment methods are predicated on accurate and precise positioning for treatment, intrafraction motion monitoring is medically necessary to ensure accurate and safe treatment delivery.  The ability to quantify intrafraction motion without excessive ionizing radiation dose can only be performed with optical surface tracking. Accordingly, surface imaging offers the opportunity to obtain 3D measurements of patient position throughout IMRT and 3D treatments without excessive radiation exposure.  I am ordering optical surface tracking for this patient's upcoming course of radiotherapy. ________________________________  Kyung Rudd, MD 02/01/2019 9:31 AM    Reference:   Ursula Alert, J, et al. Surface imaging-based analysis of intrafraction motion for breast radiotherapy patients.Journal of [employer], n. 6, nov. 2014. ISSN [id number].   Available at: <http://www.jacmp.org/index.php/jacmp/article/view/4957>.

## 2019-02-04 ENCOUNTER — Ambulatory Visit: Payer: BC Managed Care – PPO

## 2019-02-04 ENCOUNTER — Other Ambulatory Visit: Payer: Self-pay

## 2019-02-04 ENCOUNTER — Ambulatory Visit
Admission: RE | Admit: 2019-02-04 | Discharge: 2019-02-04 | Disposition: A | Discharge status: Home / Self Care | Payer: BC Managed Care – PPO | Source: Ambulatory Visit | Attending: Radiation Oncology | Admitting: Radiation Oncology

## 2019-02-04 DIAGNOSIS — C50211 Malignant neoplasm of upper-inner quadrant of right female breast: Secondary | ICD-10-CM | POA: Diagnosis not present

## 2019-02-05 ENCOUNTER — Other Ambulatory Visit: Payer: Self-pay

## 2019-02-05 ENCOUNTER — Ambulatory Visit
Admission: RE | Admit: 2019-02-05 | Discharge: 2019-02-05 | Disposition: A | Discharge status: Home / Self Care | Payer: BC Managed Care – PPO | Source: Ambulatory Visit | Attending: Radiation Oncology | Admitting: Radiation Oncology

## 2019-02-05 ENCOUNTER — Ambulatory Visit: Payer: BC Managed Care – PPO

## 2019-02-05 DIAGNOSIS — C50211 Malignant neoplasm of upper-inner quadrant of right female breast: Secondary | ICD-10-CM | POA: Diagnosis not present

## 2019-02-06 ENCOUNTER — Other Ambulatory Visit: Payer: Self-pay

## 2019-02-06 ENCOUNTER — Ambulatory Visit: Payer: BC Managed Care – PPO

## 2019-02-06 ENCOUNTER — Ambulatory Visit
Admission: RE | Admit: 2019-02-06 | Discharge: 2019-02-06 | Disposition: A | Discharge status: Home / Self Care | Payer: BC Managed Care – PPO | Source: Ambulatory Visit | Attending: Radiation Oncology | Admitting: Radiation Oncology

## 2019-02-06 DIAGNOSIS — C50211 Malignant neoplasm of upper-inner quadrant of right female breast: Secondary | ICD-10-CM | POA: Diagnosis not present

## 2019-02-07 ENCOUNTER — Ambulatory Visit: Payer: BC Managed Care – PPO

## 2019-02-07 ENCOUNTER — Other Ambulatory Visit: Payer: Self-pay

## 2019-02-07 ENCOUNTER — Ambulatory Visit
Admission: RE | Admit: 2019-02-07 | Discharge: 2019-02-07 | Disposition: A | Discharge status: Home / Self Care | Payer: BC Managed Care – PPO | Source: Ambulatory Visit | Attending: Radiation Oncology | Admitting: Radiation Oncology

## 2019-02-07 DIAGNOSIS — C50211 Malignant neoplasm of upper-inner quadrant of right female breast: Secondary | ICD-10-CM | POA: Diagnosis not present

## 2019-02-07 NOTE — Assessment & Plan Note (Signed)
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%, PR 0%, HER-2 negative, Ki-67 20%; biopsy of the 4.5 cm mass came back as IDC grade 2-3, ER 50%, PR 5%, HER-2 -2+ ratio 1.6, copy #3.2, Ki-67 20%, T2N1 stage IIb  Recommendation: 1.Neoadjuvant chemotherapy with dose dense Adriamycinand Cytoxan x4 followed by Taxol weekly x11 05/09/2018-10/02/2018 2.11/20/2018:Rightmastectomy and axillary lymph node dissection 3.Since the final path was HER-2 positive she will get 1 year of Herceptin 4.Adjuvantradiation 5.Followed by adjuvant antiestrogen therapy ----------------------------------------------------------- 11/20/2018:Right mastectomy: Residual grade 2 invasive ductal carcinoma multifocal, largest 1.8 cm, intermediate grade DCIS, margins negative, lymphovascular space invasion present, 0/6 lymph nodes negative, ER 60%, PR 60%, HER-2 positive ratio 2.59  Current treatment: Herceptin maintenance every 3 weeks  Herceptin toxicities: None Echocardiogram 12/13/2018: EF 55 to 60%: Normal  Return to clinic every 3 weeks for Herceptin every 6 weeks of follow-up with me. 

## 2019-02-08 ENCOUNTER — Ambulatory Visit
Admission: RE | Admit: 2019-02-08 | Discharge: 2019-02-08 | Disposition: A | Discharge status: Home / Self Care | Payer: BC Managed Care – PPO | Source: Ambulatory Visit | Attending: Radiation Oncology | Admitting: Radiation Oncology

## 2019-02-08 ENCOUNTER — Other Ambulatory Visit: Payer: Self-pay

## 2019-02-08 DIAGNOSIS — C50211 Malignant neoplasm of upper-inner quadrant of right female breast: Secondary | ICD-10-CM | POA: Diagnosis not present

## 2019-02-11 ENCOUNTER — Other Ambulatory Visit: Payer: Self-pay

## 2019-02-11 ENCOUNTER — Ambulatory Visit
Admission: RE | Admit: 2019-02-11 | Discharge: 2019-02-11 | Disposition: A | Discharge status: Home / Self Care | Payer: BC Managed Care – PPO | Source: Ambulatory Visit | Attending: Radiation Oncology | Admitting: Radiation Oncology

## 2019-02-11 DIAGNOSIS — C50211 Malignant neoplasm of upper-inner quadrant of right female breast: Secondary | ICD-10-CM | POA: Diagnosis not present

## 2019-02-12 ENCOUNTER — Ambulatory Visit
Admission: RE | Admit: 2019-02-12 | Discharge: 2019-02-12 | Disposition: A | Discharge status: Home / Self Care | Payer: BC Managed Care – PPO | Source: Ambulatory Visit | Attending: Radiation Oncology | Admitting: Radiation Oncology

## 2019-02-12 ENCOUNTER — Other Ambulatory Visit: Payer: Self-pay

## 2019-02-12 DIAGNOSIS — C50211 Malignant neoplasm of upper-inner quadrant of right female breast: Secondary | ICD-10-CM | POA: Diagnosis not present

## 2019-02-13 ENCOUNTER — Ambulatory Visit
Admission: RE | Admit: 2019-02-13 | Discharge: 2019-02-13 | Disposition: A | Discharge status: Home / Self Care | Payer: BC Managed Care – PPO | Source: Ambulatory Visit | Attending: Radiation Oncology | Admitting: Radiation Oncology

## 2019-02-13 ENCOUNTER — Other Ambulatory Visit: Payer: Self-pay

## 2019-02-13 DIAGNOSIS — C50211 Malignant neoplasm of upper-inner quadrant of right female breast: Secondary | ICD-10-CM | POA: Diagnosis not present

## 2019-02-13 NOTE — Progress Notes (Signed)
Patient Care Team: [name], DO as PCP - General (Family Medicine) [name], MD as Consulting Physician (General Surgery) [name], MD as Consulting Physician (Hematology and Oncology) [name], RN as Oncology Nurse Navigator [name], RN as Oncology Nurse Navigator  DIAGNOSIS:    ICD-10-CM   1. Malignant neoplasm of upper-inner quadrant of right breast in female, estrogen receptor positive ([facility])  C50.211    Z17.0     SUMMARY OF ONCOLOGIC HISTORY: Oncology History  Malignant neoplasm of upper-inner quadrant of right breast in female, estrogen receptor positive ([facility] [facility])  04/17/2018 Initial Diagnosis   Palpable right breast masses UOQ and UIQ with multiple masses 12 to 14 cm, at 1:00 1.6 cm, 1231.8 cm, and o'clock 4.5 cm and at 9:30 position 5 cm: Biopsy revealed IDC with DCIS, grade 3, ER 80%, PR 0%, HER-2 negative, Ki-67 20%; biopsy of the 4.5 cm mass came back as IDC grade 2-3, ER 50%, PR 5%, HER-2 -2+ ratio 1.6, copy #3.2, Ki-67 20%, T2N1 stage IIb   05/09/2018 - 10/08/2018 Neo-Adjuvant Chemotherapy   Neoadjuvant chemotherapy with dose dense Adriamycin and Cytoxan x4 followed by Taxol weekly x12   06/19/2018 Genetic Testing   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  The Multi-Gene Panel offered by [facility] includes sequencing and/or deletion duplication testing of the following 84 genes: AIP, ALK, APC, ATM, AXIN2,BAP1,  BARD1, BLM, BMPR1A, BRCA1, BRCA2, BRIP1, CASR, CDC73, CDH1, CDK4, CDKN1B, CDKN1C, CDKN2A (p14ARF), CDKN2A (p16INK4a), CEBPA, CHEK2, CTNNA1, DICER1, DIS3L2, EGFR (c.2369C>T, p.Thr790Met variant only), EPCAM (Deletion/duplication testing only), FH, FLCN, GATA2, GPC3, GREM1 (Promoter region deletion/duplication testing only), HOXB13 (c.251G>A, p.Gly84Glu), HRAS, KIT, MAX, MEN1, MET, MITF (c.952G>A, p.Glu318Lys variant only), MLH1, MSH2, MSH3, MSH6, MUTYH, NBN, NF1, NF2, NTHL1, PALB2, PDGFRA,  PHOX2B, PMS2, POLD1, POLE, POT1, PRKAR1A, PTCH1, PTEN, RAD50, RAD51C, RAD51D, RB1, RECQL4, RET, RUNX1, SDHAF2, SDHA (sequence changes only), SDHB, SDHC, SDHD, SMAD4, SMARCA4, SMARCB1, SMARCE1, STK11, SUFU, TERC, TERT, TMEM127, TP53, TSC1, TSC2, VHL, WRN and WT1.  The report date is 06/19/2018.   10/11/2018 Breast MRI   Significant decrease in the size of the right axillary adenopathy and right breast masses from 1.2cm to 0.9cm with minimal residual enhancement, and 2.4x1.4cm to 2.3x1.9cm.   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, largest 1.8 cm, intermediate grade DCIS, margins negative, lymphovascular space invasion present, 0/6 lymph nodes negative, ER 60%, PR 60%, HER-2 positive ratio 2.59   11/20/2018 Cancer Staging   Staging form: Breast, AJCC 8th Edition - Pathologic stage from 11/20/2018: Stage IA (pT1c, pN0, cM0, G2, ER+, PR+, HER2+) - Signed by [name], NP on 12/05/2018   12/14/2018 -  Chemotherapy   The patient had trastuzumab (HERCEPTIN) 546 mg in sodium chloride 0.9 % 250 mL chemo infusion, 8 mg/kg = 546 mg, Intravenous,  Once, 3 of 3 cycles Administration: 546 mg (12/14/2018), 399 mg (01/03/2019), 399 mg (01/24/2019) trastuzumab-dkst (OGIVRI) 399 mg in sodium chloride 0.9 % 250 mL chemo infusion, 6 mg/kg = 399 mg (100 % of original dose 6 mg/kg), Intravenous,  Once, 1 of 14 cycles Dose modification: 6 mg/kg (original dose 6 mg/kg, Cycle 4, Reason: Other (see comments), Comment: Biosimilar Conversion) Administration: 399 mg (02/14/2019)  for chemotherapy treatment.    01/01/2019 -  Radiation Therapy   Adjuvant XRT to right chest wall     CHIEF COMPLIANT: Herceptin maintenance  INTERVAL HISTORY: Miranda Ayers is a  33 y.o. with above-mentioned history of right breast cancer who completed neoadjuvant chemotherapy and underwent a right mastectomy. She is currently undergoing radiation treatment and adjuvant Herceptin  maintenance. She presents to the clinic today for treatment. Today's last day of radiation and she has very sore in her breast with skin changes from radiation.  REVIEW OF SYSTEMS:   Constitutional: Denies fevers, chills or abnormal weight loss Eyes: Denies blurriness of vision Ears, nose, mouth, throat, and face: Denies mucositis or sore throat Respiratory: Denies cough, dyspnea or wheezes Cardiovascular: Denies palpitation, chest discomfort Gastrointestinal: Denies nausea, heartburn or change in bowel habits Skin: Denies abnormal skin rashes Lymphatics: Denies new lymphadenopathy or easy bruising Neurological: Denies numbness, tingling or new weaknesses Behavioral/Psych: Mood is stable, no new changes  Extremities: No lower extremity edema Breast:  complains of radiation dermatitis All other systems were reviewed with the patient and are negative.  I have reviewed the past medical history, past surgical history, social history and family history with the patient and they are unchanged from previous note.  ALLERGIES:  has No Known Allergies.  MEDICATIONS:  Current Outpatient Medications  Medication Sig Dispense Refill   hydrochlorothiazide (HYDRODIURIL) 12.5 MG tablet Take 12.5 mg by mouth daily.     No current facility-administered medications for this visit.     PHYSICAL EXAMINATION: ECOG PERFORMANCE STATUS: 1 - Symptomatic but completely ambulatory  Vitals:   02/14/19 0940  BP: (!) 136/102  Pulse: 88  Resp: 18  Temp: 98.9 F (37.2 C)  SpO2: 100%   Filed Weights   02/14/19 0940  Weight: 147 lb 8 oz (66.9 kg)    GENERAL: alert, no distress and comfortable SKIN: skin color, texture, turgor are normal, no rashes or significant lesions EYES: normal, Conjunctiva are pink and non-injected, sclera clear OROPHARYNX: no exudate, no erythema and lips, buccal mucosa, and tongue normal  NECK: supple, thyroid normal size, non-tender, without nodularity LYMPH: no palpable  lymphadenopathy in the cervical, axillary or inguinal LUNGS: clear to auscultation and percussion with normal breathing effort HEART: regular rate & rhythm and no murmurs and no lower extremity edema ABDOMEN: abdomen soft, non-tender and normal bowel sounds MUSCULOSKELETAL: no cyanosis of digits and no clubbing  NEURO: alert & oriented x 3 with fluent speech, no focal motor/sensory deficits EXTREMITIES: No lower extremity edema  LABORATORY DATA:  I have reviewed the data as listed CMP Latest Ref Rng & Units 11/16/2018 10/09/2018 10/02/2018  Glucose 70 - 99 mg/dL 82 85 99  BUN 6 - 20 mg/dL 7 5(L) 6  Creatinine 0.44 - 1.00 mg/dL 0.74 0.67 0.72  Sodium 135 - 145 mmol/L 141 140 140  Potassium 3.5 - 5.1 mmol/L 4.8 4.1 3.6  Chloride 98 - 111 mmol/L 105 106 106  CO2 22 - 32 mmol/L 28 24 23   Calcium 8.9 - 10.3 mg/dL 10.2 9.7 9.4  Total Protein 6.5 - 8.1 g/dL 7.6 7.4 7.4  Total Bilirubin 0.3 - 1.2 mg/dL 0.8 0.3 0.2(L)  Alkaline Phos 38 - 126 U/L 60 68 69  AST 15 - 41 U/L 21 18 15   ALT 0 - 44 U/L 19 18 13     Lab Results  Component Value Date   WBC 2.7 (L) 11/16/2018   HGB 13.4 11/16/2018   HCT 41.3 11/16/2018   MCV 89.2 11/16/2018   PLT 207 11/16/2018   NEUTROABS 1.2 (L) 11/16/2018    ASSESSMENT & PLAN:  Malignant neoplasm of upper-inner quadrant of right breast in female, estrogen receptor positive ([facility])  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%, PR 0%, HER-2 negative, Ki-67 20%; biopsy of the 4.5 cm mass came back as IDC grade 2-3, ER 50%, PR 5%, HER-2 -2+ ratio 1.6, copy #3.2, Ki-67 20%, T2N1 stage IIb  Recommendation: 1.Neoadjuvant chemotherapy with dose dense Adriamycinand Cytoxan x4 followed by Taxol weekly x11 05/09/2018-10/02/2018 2.11/20/2018:Rightmastectomy and axillary lymph node dissection 3.  Since the final  path was HER-2 positive she will get 1 year of Herceptin 4.Adjuvant radiation 5.Followed by adjuvant antiestrogen therapy ----------------------------------------------------------- 11/20/2018:Right mastectomy: Residual grade 2 invasive ductal carcinoma multifocal, largest 1.8 cm, intermediate grade DCIS, margins negative, lymphovascular space invasion present, 0/6 lymph nodes negative, ER 60%, PR 60%, HER-2 positive ratio 2.59  Current treatment: Herceptin maintenance every 3 weeks Antiestrogen therapy counseling: I discussed the treatment options for antiestrogen therapy.  My recommendation is Zoladex with anastrozole.  However it requires the injections monthly.  She understands the side effects of antiestrogen therapy in terms of risk of hot flashes joint and muscle pains as well as risk of osteoporosis.  She understands that the other way to do ovarian suppression is with an oophorectomy.  This is based on SOFT and TEXT clinical trials.  The alternate if she does not intend to come monthly for injections then she would be taking tamoxifen for 10 years. She is not sure what she wants to do with antiestrogen therapy part. If she makes up her mind and decides to do Zoladex injections we can do it with her next Herceptin.  Otherwise I will see her in 6 weeks and then start her on tamoxifen.  Herceptin toxicities: None Echocardiogram 12/13/2018: EF 55 to 60%: Normal Return to clinic every 3 weeks for Herceptin every 6 weeks of follow-up with me.  No orders of the defined types were placed in this encounter.  The patient has a good understanding of the overall plan. she agrees with it. she will call with any problems that may develop before the next visit here.  [name], MD 02/14/2019  Julious Oka Dorshimer am acting as scribe for Dr. [name].  I have reviewed the above documentation for accuracy and completeness, and I agree with the above.

## 2019-02-14 ENCOUNTER — Inpatient Hospital Stay (HOSPITAL_BASED_OUTPATIENT_CLINIC_OR_DEPARTMENT_OTHER): Payer: BC Managed Care – PPO | Admitting: Hematology and Oncology

## 2019-02-14 ENCOUNTER — Other Ambulatory Visit: Payer: Self-pay

## 2019-02-14 ENCOUNTER — Encounter: Payer: Self-pay | Admitting: *Deleted

## 2019-02-14 ENCOUNTER — Encounter: Payer: Self-pay | Admitting: Radiation Oncology

## 2019-02-14 ENCOUNTER — Ambulatory Visit
Admission: RE | Admit: 2019-02-14 | Discharge: 2019-02-14 | Disposition: A | Discharge status: Home / Self Care | Payer: BC Managed Care – PPO | Source: Ambulatory Visit | Attending: Radiation Oncology | Admitting: Radiation Oncology

## 2019-02-14 ENCOUNTER — Inpatient Hospital Stay: Payer: BC Managed Care – PPO

## 2019-02-14 DIAGNOSIS — Z170 Estrogen receptor positive status [ER+]: Secondary | ICD-10-CM

## 2019-02-14 DIAGNOSIS — C50211 Malignant neoplasm of upper-inner quadrant of right female breast: Secondary | ICD-10-CM

## 2019-02-14 DIAGNOSIS — Z5112 Encounter for antineoplastic immunotherapy: Secondary | ICD-10-CM | POA: Diagnosis not present

## 2019-02-14 MED ORDER — DIPHENHYDRAMINE HCL 25 MG PO CAPS
50.0000 mg | ORAL_CAPSULE | Freq: Once | ORAL | Status: AC
  Administered 2019-02-14: 10:00:00 50 mg via ORAL

## 2019-02-14 MED ORDER — TRASTUZUMAB-DKST CHEMO 150 MG IV SOLR
6.0000 mg/kg | Freq: Once | INTRAVENOUS | Status: AC
  Administered 2019-02-14: 11:00:00 399 mg via INTRAVENOUS
  Filled 2019-02-14: qty 19

## 2019-02-14 MED ORDER — ACETAMINOPHEN 325 MG PO TABS
650.0000 mg | ORAL_TABLET | Freq: Once | ORAL | Status: AC
  Administered 2019-02-14: 10:00:00 650 mg via ORAL

## 2019-02-14 MED ORDER — ACETAMINOPHEN 325 MG PO TABS
ORAL_TABLET | ORAL | Status: AC
  Filled 2019-02-14: qty 2

## 2019-02-14 MED ORDER — SODIUM CHLORIDE 0.9 % IV SOLN
Freq: Once | INTRAVENOUS | Status: AC
  Administered 2019-02-14: 10:00:00 via INTRAVENOUS
  Filled 2019-02-14: qty 250

## 2019-02-14 MED ORDER — DIPHENHYDRAMINE HCL 25 MG PO CAPS
ORAL_CAPSULE | ORAL | Status: AC
  Filled 2019-02-14: qty 2

## 2019-02-14 NOTE — Progress Notes (Signed)
Per Dr. Lindi Adie okay to treat with BP 136/102

## 2019-02-15 ENCOUNTER — Telehealth: Payer: Self-pay | Admitting: Hematology and Oncology

## 2019-02-15 NOTE — Telephone Encounter (Signed)
I could not reach patient regarding lab add on for 10/8

## 2019-03-07 ENCOUNTER — Other Ambulatory Visit: Payer: Self-pay

## 2019-03-07 ENCOUNTER — Inpatient Hospital Stay: Payer: BC Managed Care – PPO | Attending: Hematology and Oncology

## 2019-03-07 VITALS — BP 126/109 | Temp 98.0°F | Resp 16 | Wt 148.5 lb

## 2019-03-07 DIAGNOSIS — Z170 Estrogen receptor positive status [ER+]: Secondary | ICD-10-CM

## 2019-03-07 DIAGNOSIS — Z5112 Encounter for antineoplastic immunotherapy: Secondary | ICD-10-CM | POA: Insufficient documentation

## 2019-03-07 DIAGNOSIS — C50211 Malignant neoplasm of upper-inner quadrant of right female breast: Secondary | ICD-10-CM | POA: Diagnosis present

## 2019-03-07 MED ORDER — ACETAMINOPHEN 325 MG PO TABS
ORAL_TABLET | ORAL | Status: AC
  Filled 2019-03-07: qty 2

## 2019-03-07 MED ORDER — TRASTUZUMAB-DKST CHEMO 150 MG IV SOLR
6.0000 mg/kg | Freq: Once | INTRAVENOUS | Status: AC
  Administered 2019-03-07: 10:00:00 399 mg via INTRAVENOUS
  Filled 2019-03-07: qty 19

## 2019-03-07 MED ORDER — SODIUM CHLORIDE 0.9 % IV SOLN
Freq: Once | INTRAVENOUS | Status: AC
  Administered 2019-03-07: 10:00:00 via INTRAVENOUS
  Filled 2019-03-07: qty 250

## 2019-03-07 MED ORDER — DIPHENHYDRAMINE HCL 25 MG PO CAPS
50.0000 mg | ORAL_CAPSULE | Freq: Once | ORAL | Status: AC
  Administered 2019-03-07: 50 mg via ORAL

## 2019-03-07 MED ORDER — ACETAMINOPHEN 325 MG PO TABS
650.0000 mg | ORAL_TABLET | Freq: Once | ORAL | Status: AC
  Administered 2019-03-07: 650 mg via ORAL

## 2019-03-07 MED ORDER — DIPHENHYDRAMINE HCL 25 MG PO CAPS
ORAL_CAPSULE | ORAL | Status: AC
  Filled 2019-03-07: qty 2

## 2019-03-07 NOTE — Progress Notes (Signed)
Per Dr. Lindi Adie okay to treat with BP 125/109

## 2019-03-07 NOTE — Patient Instructions (Signed)
[facility] Discharge Instructions for Patients Receiving Chemotherapy  Today you received the following chemotherapy agents:  Ogivri  To help prevent nausea and vomiting after your treatment, we encourage you to take your nausea medication as prescribed.   If you develop nausea and vomiting that is not controlled by your nausea medication, call the clinic.   BELOW ARE SYMPTOMS THAT SHOULD BE REPORTED IMMEDIATELY:  *FEVER GREATER THAN 100.5 F  *CHILLS WITH OR WITHOUT FEVER  NAUSEA AND VOMITING THAT IS NOT CONTROLLED WITH YOUR NAUSEA MEDICATION  *UNUSUAL SHORTNESS OF BREATH  *UNUSUAL BRUISING OR BLEEDING  TENDERNESS IN MOUTH AND THROAT WITH OR WITHOUT PRESENCE OF ULCERS  *URINARY PROBLEMS  *BOWEL PROBLEMS  UNUSUAL RASH Items with * indicate a potential emergency and should be followed up as soon as possible.  Feel free to call the clinic should you have any questions or concerns. The clinic phone number is [phone].  Please show the [facility] at check-in to the Emergency Department and triage nurse.   Coronavirus (COVID-19) Are you at risk?  Are you at risk for the Coronavirus (COVID-19)?  To be considered HIGH RISK for Coronavirus (COVID-19), you have to meet the following criteria:  . Traveled to [place], [place], [place], [place] or [place]; or in the [place] to [place], [place], [place], or [place]; and have fever, cough, and shortness of breath within the last 2 weeks of travel OR . Been in close contact with a person diagnosed with COVID-19 within the last 2 weeks and have fever, cough, and shortness of breath . IF YOU DO NOT MEET THESE CRITERIA, YOU ARE CONSIDERED LOW RISK FOR COVID-19.  What to do if you are HIGH RISK for COVID-19?  Marland Kitchen If you are having a medical emergency, call 911. . Seek medical care right away. Before you go to a doctor's office, urgent care or emergency department, call ahead and tell them about your  recent travel, contact with someone diagnosed with COVID-19, and your symptoms. You should receive instructions from your physician's office regarding next steps of care.  . When you arrive at healthcare provider, tell the healthcare staff immediately you have returned from visiting [place], [place], [place], [place] or [place]; or traveled in the [place] to [place], [place], [place], or [place]; in the last two weeks or you have been in close contact with a person diagnosed with COVID-19 in the last 2 weeks.   . Tell the health care staff about your symptoms: fever, cough and shortness of breath. . After you have been seen by a medical provider, you will be either: o Tested for (COVID-19) and discharged home on quarantine except to seek medical care if symptoms worsen, and asked to  - Stay home and avoid contact with others until you get your results (4-5 days)  - Avoid travel on public transportation if possible (such as bus, train, or airplane) or o Sent to the Emergency Department by EMS for evaluation, COVID-19 testing, and possible admission depending on your condition and test results.  What to do if you are LOW RISK for COVID-19?  Reduce your risk of any infection by using the same precautions used for avoiding the common cold or flu:  Marland Kitchen Wash your hands often with soap and warm water for at least 20 seconds.  If soap and water are not readily available, use an alcohol-based hand sanitizer with at least 60% alcohol.  . If coughing  or sneezing, cover your mouth and nose by coughing or sneezing into the elbow areas of your shirt or coat, into a tissue or into your sleeve (not your hands). . Avoid shaking hands with others and consider head nods or verbal greetings only. . Avoid touching your eyes, nose, or mouth with unwashed hands.  . Avoid close contact with people who are sick. . Avoid places or events with large numbers of people in one location, like concerts or sporting  events. . Carefully consider travel plans you have or are making. . If you are planning any travel outside or inside the [place], visit the CDC's Travelers' Health webpage for the latest health notices. . If you have some symptoms but not all symptoms, continue to monitor at home and seek medical attention if your symptoms worsen. . If you are having a medical emergency, call 911.   ADDITIONAL HEALTHCARE OPTIONS FOR PATIENTS   Telehealth / e-Visit: [email or web address]         [facility]: [phone]  [facility]: [phone]                   [facility]: [phone]     

## 2019-03-12 ENCOUNTER — Telehealth: Payer: Self-pay | Admitting: Radiation Oncology

## 2019-03-12 NOTE — Telephone Encounter (Signed)
  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  Interval Since Last Radiation:  4 weeks   01/01/2019-02/14/2019:  The right chest wall and regional nodes were treated to 50.4 Gy in 28 fractions, followed by a 10 Gy boost in 5 fractions.   Narrative:  The patient was contacted today for routine follow-up. During treatment she did very well with radiotherapy though she did have dry desquamation following treatment.   Impression/Plan: 1.   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 I left a message for the patient reiterating that I was calling to check on her progress since treatment completed. The patient has plans for follow up already with Dr. Lindi Adie  in medical oncology. She was counseled on skin care as well as measures to avoid sun exposure to this area.     Carola Rhine, PAC

## 2019-03-15 NOTE — Progress Notes (Signed)
  Radiation Oncology         [phone] ________________________________  Name: Miranda Ayers MRN: [id number]  Date: 02/14/2019  DOB: [birth year]  End of Treatment Note  Diagnosis:   right-sided breast cancer     Indication for treatment:  Curative       Radiation treatment dates:   01/01/19 - 02/14/19  Site/dose:   The patient initially received a dose of 50.4 Gy in 28 fractions to the chest wall and supraclavicular region. This was delivered using a 3-D conformal, 4 field technique. The patient then received a boost to the mastectomy scar. This delivered an additional 10 Gy in 5 fractions using an en face electron field. The total dose was 60.4 Gy.  Narrative: The patient tolerated radiation treatment relatively well.   The patient had some expected skin irritation as she progressed during treatment. Moist desquamation was not present at the end of treatment.  Plan: The patient has completed radiation treatment. The patient will return to radiation oncology clinic for routine followup in one month. I advised the patient to call or return sooner if they have any questions or concerns related to their recovery or treatment. ________________________________  Jodelle Gross, M.D., Ph.D.

## 2019-03-27 ENCOUNTER — Other Ambulatory Visit: Payer: Self-pay | Admitting: *Deleted

## 2019-03-27 DIAGNOSIS — C50211 Malignant neoplasm of upper-inner quadrant of right female breast: Secondary | ICD-10-CM

## 2019-03-27 NOTE — Progress Notes (Signed)
Patient Care Team: [name], DO as PCP - General (Family Medicine) [name], MD as Consulting Physician (General Surgery) [name], MD as Consulting Physician (Hematology and Oncology) [name], RN as Oncology Nurse Navigator [name], RN as Oncology Nurse Navigator  DIAGNOSIS:    ICD-10-CM   1. Malignant neoplasm of upper-inner quadrant of right breast in female, estrogen receptor positive ([place])  C50.211    Z17.0     SUMMARY OF ONCOLOGIC HISTORY: Oncology History  Malignant neoplasm of upper-inner quadrant of right breast in female, estrogen receptor positive ([place])  04/17/2018 Initial Diagnosis   Palpable right breast masses UOQ and UIQ with multiple masses 12 to 14 cm, at 1:00 1.6 cm, 1231.8 cm, and o'clock 4.5 cm and at 9:30 position 5 cm: Biopsy revealed IDC with DCIS, grade 3, ER 80%, PR 0%, HER-2 negative, Ki-67 20%; biopsy of the 4.5 cm mass came back as IDC grade 2-3, ER 50%, PR 5%, HER-2 -2+ ratio 1.6, copy #3.2, Ki-67 20%, T2N1 stage IIb   05/09/2018 - 10/08/2018 Neo-Adjuvant Chemotherapy   Neoadjuvant chemotherapy with dose dense Adriamycin and Cytoxan x4 followed by Taxol weekly x12   06/19/2018 Genetic Testing   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  The Multi-Gene Panel offered by [facility] includes sequencing and/or deletion duplication testing of the following 84 genes: AIP, ALK, APC, ATM, AXIN2,BAP1,  BARD1, BLM, BMPR1A, BRCA1, BRCA2, BRIP1, CASR, CDC73, CDH1, CDK4, CDKN1B, CDKN1C, CDKN2A (p14ARF), CDKN2A (p16INK4a), CEBPA, CHEK2, CTNNA1, DICER1, DIS3L2, EGFR (c.2369C>T, p.Thr790Met variant only), EPCAM (Deletion/duplication testing only), FH, FLCN, GATA2, GPC3, GREM1 (Promoter region deletion/duplication testing only), HOXB13 (c.251G>A, p.Gly84Glu), HRAS, KIT, MAX, MEN1, MET, MITF (c.952G>A, p.Glu318Lys variant only), MLH1, MSH2, MSH3, MSH6, MUTYH, NBN, NF1, NF2, NTHL1, PALB2, PDGFRA,  PHOX2B, PMS2, POLD1, POLE, POT1, PRKAR1A, PTCH1, PTEN, RAD50, RAD51C, RAD51D, RB1, RECQL4, RET, RUNX1, SDHAF2, SDHA (sequence changes only), SDHB, SDHC, SDHD, SMAD4, SMARCA4, SMARCB1, SMARCE1, STK11, SUFU, TERC, TERT, TMEM127, TP53, TSC1, TSC2, VHL, WRN and WT1.  The report date is 06/19/2018.   10/11/2018 Breast MRI   Significant decrease in the size of the right axillary adenopathy and right breast masses from 1.2cm to 0.9cm with minimal residual enhancement, and 2.4x1.4cm to 2.3x1.9cm.   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, largest 1.8 cm, intermediate grade DCIS, margins negative, lymphovascular space invasion present, 0/6 lymph nodes negative, ER 60%, PR 60%, HER-2 positive ratio 2.59   11/20/2018 Cancer Staging   Staging form: Breast, AJCC 8th Edition - Pathologic stage from 11/20/2018: Stage IA (pT1c, pN0, cM0, G2, ER+, PR+, HER2+) - Signed by [name], NP on 12/05/2018   12/14/2018 -  Chemotherapy   The patient had trastuzumab (HERCEPTIN) 546 mg in sodium chloride 0.9 % 250 mL chemo infusion, 8 mg/kg = 546 mg, Intravenous,  Once, 3 of 3 cycles Administration: 546 mg (12/14/2018), 399 mg (01/03/2019), 399 mg (01/24/2019) trastuzumab-dkst (OGIVRI) 399 mg in sodium chloride 0.9 % 250 mL chemo infusion, 6 mg/kg = 399 mg (100 % of original dose 6 mg/kg), Intravenous,  Once, 3 of 14 cycles Dose modification: 6 mg/kg (original dose 6 mg/kg, Cycle 4, Reason: Other (see comments), Comment: Biosimilar Conversion) Administration: 399 mg (02/14/2019), 399 mg (03/07/2019)  for chemotherapy treatment.    01/01/2019 - 02/14/2019 Radiation Therapy   Adjuvant XRT to right chest wall     CHIEF COMPLIANT: Herceptin maintenance  INTERVAL HISTORY: Miranda  Ayers is a 33 y.o. with above-mentioned history of right breast cancer who completed neoadjuvant chemotherapy, underwent a right mastectomy and radiation treatment, and is currently on adjuvant  Herceptin maintenance.She presents to the clinic todayfor treatment.  She is here to make a decision on the antiestrogen therapy as well.  We discussed with her previously about complete estrogen blockade versus tamoxifen.  She wants to take tamoxifen instead.  REVIEW OF SYSTEMS:   Constitutional: Denies fevers, chills or abnormal weight loss Eyes: Denies blurriness of vision Ears, nose, mouth, throat, and face: Denies mucositis or sore throat Respiratory: Denies cough, dyspnea or wheezes Cardiovascular: Denies palpitation, chest discomfort Gastrointestinal: Denies nausea, heartburn or change in bowel habits Skin: Denies abnormal skin rashes Lymphatics: Denies new lymphadenopathy or easy bruising Neurological: Denies numbness, tingling or new weaknesses Behavioral/Psych: Mood is stable, no new changes  Extremities: No lower extremity edema Breast: denies any pain or lumps or nodules in either breasts All other systems were reviewed with the patient and are negative.  I have reviewed the past medical history, past surgical history, social history and family history with the patient and they are unchanged from previous note.  ALLERGIES:  has No Known Allergies.  MEDICATIONS:  Current Outpatient Medications  Medication Sig Dispense Refill  . hydrochlorothiazide (HYDRODIURIL) 12.5 MG tablet Take 12.5 mg by mouth daily.    . tamoxifen (NOLVADEX) 20 MG tablet Take 1 tablet (20 mg total) by mouth daily. 90 tablet 3   No current facility-administered medications for this visit.    Facility-Administered Medications Ordered in Other Visits  Medication Dose Route Frequency Provider Last Rate Last Dose  . 0.9 %  sodium chloride infusion   Intravenous Once [name], MD      . trastuzumab-dkst (OGIVRI) 399 mg in sodium chloride 0.9 % 250 mL chemo infusion  6 mg/kg (Order-Specific) Intravenous Once [name], MD        PHYSICAL EXAMINATION: ECOG PERFORMANCE STATUS: 1 - Symptomatic but  completely ambulatory  Vitals:   03/28/19 0912  BP: (!) 148/97  Pulse: 97  Resp: 17  Temp: 98.3 F (36.8 C)  SpO2: 100%   Filed Weights   03/28/19 0912  Weight: 151 lb 4.8 oz (68.6 kg)    GENERAL: alert, no distress and comfortable SKIN: skin color, texture, turgor are normal, no rashes or significant lesions EYES: normal, Conjunctiva are pink and non-injected, sclera clear OROPHARYNX: no exudate, no erythema and lips, buccal mucosa, and tongue normal  NECK: supple, thyroid normal size, non-tender, without nodularity LYMPH: no palpable lymphadenopathy in the cervical, axillary or inguinal LUNGS: clear to auscultation and percussion with normal breathing effort HEART: regular rate & rhythm and no murmurs and no lower extremity edema ABDOMEN: abdomen soft, non-tender and normal bowel sounds MUSCULOSKELETAL: no cyanosis of digits and no clubbing  NEURO: alert & oriented x 3 with fluent speech, no focal motor/sensory deficits EXTREMITIES: No lower extremity edema  LABORATORY DATA:  I have reviewed the data as listed CMP Latest Ref Rng & Units 03/28/2019 11/16/2018 10/09/2018  Glucose 70 - 99 mg/dL 94 82 85  BUN 6 - 20 mg/dL 12 7 5(L)  Creatinine 0.44 - 1.00 mg/dL 0.76 0.74 0.67  Sodium 135 - 145 mmol/L 142 141 140  Potassium 3.5 - 5.1 mmol/L 3.3(L) 4.8 4.1  Chloride 98 - 111 mmol/L 108 105 106  CO2 22 - 32 mmol/L _0 Calcium 8.9 - 10.3 mg/dL 9.4 10.2 9.7  Total Protein 6.5 - 8.1  g/dL 7.5 7.6 7.4  Total Bilirubin 0.3 - 1.2 mg/dL 0.3 0.8 0.3  Alkaline Phos 38 - 126 U/L 77 60 68  AST 15 - 41 U/L _0 ALT 0 - 44 U/L _1 Lab Results  Component Value Date   WBC 4.9 03/28/2019   HGB 12.8 03/28/2019   HCT 40.2 03/28/2019   MCV 86.8 03/28/2019   PLT 170 03/28/2019   NEUTROABS 3.5 03/28/2019    ASSESSMENT & PLAN:  Malignant neoplasm of upper-inner quadrant of right breast in female, estrogen receptor positive ([facility]) 04/17/2018:Palpable right breast masses  UOQ and UIQ with multiple masses 12 to 14 cm, at 1:00 1.6 cm, 1231.8 cm, and o'clock 4.5 cm and at 9:30 position 5 cm: Biopsy revealed IDC with DCIS, grade 3, ER 80%, PR 0%, HER-2 negative, Ki-67 20%; biopsy of the 4.5 cm mass came back as IDC grade 2-3, ER 50%, PR 5%, HER-2 -2+ ratio 1.6, copy #3.2, Ki-67 20%, T2N1 stage IIb  Recommendation: 1.Neoadjuvant chemotherapy with dose dense Adriamycinand Cytoxan x4 followed by Taxol weekly x11 05/09/2018-10/02/2018 2.11/20/2018:Rightmastectomy and axillary lymph node dissection 3.Since the final path was HER-2 positive she will get 1 year of Herceptin which will be completed in October 2021 4.Adjuvantradiation 01/01/2019-02/14/2019 5.Followed by adjuvant antiestrogen therapy patient prefers tamoxifen starting 03/28/2019 ----------------------------------------------------------- 11/20/2018:Right mastectomy: Residual grade 2 invasive ductal carcinoma multifocal, largest 1.8 cm, intermediate grade DCIS, margins negative, lymphovascular space invasion present, 0/6 lymph nodes negative, ER 60%, PR 60%, HER-2 positive ratio 2.59  Current treatment: Herceptin maintenance every 3 weeks  Herceptin toxicities: None Echocardiogram 12/13/2018: EF 55 to 60%: Normal Antiestrogen therapy: I discussed with her previously about the pros and cons of complete estrogen blockade versus tamoxifen.  We talked about the guideline recommendation is to consider complete estrogen blockade for women younger than age 3 who are high risk. She weighed the pros and cons of both of these and decided to take tamoxifen instead. I sent a prescription today for tamoxifen therapy.  Return to clinic every 3 weeks for Herceptin every 6 weeks of follow-up with me.    No orders of the defined types were placed in this encounter.  The patient has a good understanding of the overall plan. she agrees with it. she will call with any problems that may develop before the next visit  here.  [name], MD 03/28/2019  Julious Oka Dorshimer am acting as scribe for Dr. [name].  I have reviewed the above documentation for accuracy and completeness, and I agree with the above.

## 2019-03-28 ENCOUNTER — Inpatient Hospital Stay: Payer: BC Managed Care – PPO

## 2019-03-28 ENCOUNTER — Inpatient Hospital Stay: Payer: BC Managed Care – PPO | Attending: Hematology and Oncology | Admitting: Hematology and Oncology

## 2019-03-28 ENCOUNTER — Other Ambulatory Visit: Payer: Self-pay

## 2019-03-28 DIAGNOSIS — C50211 Malignant neoplasm of upper-inner quadrant of right female breast: Secondary | ICD-10-CM

## 2019-03-28 DIAGNOSIS — Z5112 Encounter for antineoplastic immunotherapy: Secondary | ICD-10-CM | POA: Insufficient documentation

## 2019-03-28 DIAGNOSIS — Z170 Estrogen receptor positive status [ER+]: Secondary | ICD-10-CM | POA: Insufficient documentation

## 2019-03-28 LAB — CBC WITH DIFFERENTIAL (CANCER CENTER ONLY)
Abs Immature Granulocytes: 0.01 10*3/uL (ref 0.00–0.07)
Basophils Absolute: 0 10*3/uL (ref 0.0–0.1)
Basophils Relative: 0 %
Eosinophils Absolute: 0 10*3/uL (ref 0.0–0.5)
Eosinophils Relative: 1 %
HCT: 40.2 % (ref 36.0–46.0)
Hemoglobin: 12.8 g/dL (ref 12.0–15.0)
Immature Granulocytes: 0 %
Lymphocytes Relative: 17 %
Lymphs Abs: 0.9 10*3/uL (ref 0.7–4.0)
MCH: 27.6 pg (ref 26.0–34.0)
MCHC: 31.8 g/dL (ref 30.0–36.0)
MCV: 86.8 fL (ref 80.0–100.0)
Monocytes Absolute: 0.5 10*3/uL (ref 0.1–1.0)
Monocytes Relative: 11 %
Neutro Abs: 3.5 10*3/uL (ref 1.7–7.7)
Neutrophils Relative %: 71 %
Platelet Count: 170 10*3/uL (ref 150–400)
RBC: 4.63 MIL/uL (ref 3.87–5.11)
RDW: 14.3 % (ref 11.5–15.5)
WBC Count: 4.9 10*3/uL (ref 4.0–10.5)
nRBC: 0 % (ref 0.0–0.2)

## 2019-03-28 LAB — CMP (CANCER CENTER ONLY)
ALT: 19 U/L (ref 0–44)
AST: 19 U/L (ref 15–41)
Albumin: 4 g/dL (ref 3.5–5.0)
Alkaline Phosphatase: 77 U/L (ref 38–126)
Anion gap: 9 (ref 5–15)
BUN: 12 mg/dL (ref 6–20)
CO2: 25 mmol/L (ref 22–32)
Calcium: 9.4 mg/dL (ref 8.9–10.3)
Chloride: 108 mmol/L (ref 98–111)
Creatinine: 0.76 mg/dL (ref 0.44–1.00)
GFR, Est AFR Am: >60 mL/min (ref >60)
GFR, Estimated: >60 mL/min (ref >60)
Glucose, Bld: 94 mg/dL (ref 70–99)
Potassium: 3.3 mmol/L — ABNORMAL LOW (ref 3.5–5.1)
Sodium: 142 mmol/L (ref 135–145)
Total Bilirubin: 0.3 mg/dL (ref 0.3–1.2)
Total Protein: 7.5 g/dL (ref 6.5–8.1)

## 2019-03-28 MED ORDER — TRASTUZUMAB-DKST CHEMO 150 MG IV SOLR
6.0000 mg/kg | Freq: Once | INTRAVENOUS | Status: AC
  Administered 2019-03-28: 11:00:00 399 mg via INTRAVENOUS
  Filled 2019-03-28: qty 19

## 2019-03-28 MED ORDER — DIPHENHYDRAMINE HCL 25 MG PO CAPS
ORAL_CAPSULE | ORAL | Status: AC
  Filled 2019-03-28: qty 2

## 2019-03-28 MED ORDER — DIPHENHYDRAMINE HCL 25 MG PO CAPS
50.0000 mg | ORAL_CAPSULE | Freq: Once | ORAL | Status: AC
  Administered 2019-03-28: 50 mg via ORAL

## 2019-03-28 MED ORDER — ACETAMINOPHEN 325 MG PO TABS
650.0000 mg | ORAL_TABLET | Freq: Once | ORAL | Status: AC
  Administered 2019-03-28: 10:00:00 650 mg via ORAL

## 2019-03-28 MED ORDER — SODIUM CHLORIDE 0.9 % IV SOLN
Freq: Once | INTRAVENOUS | Status: AC
  Administered 2019-03-28: 10:00:00 via INTRAVENOUS
  Filled 2019-03-28: qty 250

## 2019-03-28 MED ORDER — ACETAMINOPHEN 325 MG PO TABS
ORAL_TABLET | ORAL | Status: AC
  Filled 2019-03-28: qty 2

## 2019-03-28 MED ORDER — TAMOXIFEN CITRATE 20 MG PO TABS: 20.0000 mg | ORAL_TABLET | Freq: Every day | ORAL | 3 refills | Status: DC

## 2019-03-28 NOTE — Assessment & Plan Note (Signed)
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%, PR 0%, HER-2 negative, Ki-67 20%; biopsy of the 4.5 cm mass came back as IDC grade 2-3, ER 50%, PR 5%, HER-2 -2+ ratio 1.6, copy #3.2, Ki-67 20%, T2N1 stage IIb  Recommendation: 1.Neoadjuvant chemotherapy with dose dense Adriamycinand Cytoxan x4 followed by Taxol weekly x11 05/09/2018-10/02/2018 2.11/20/2018:Rightmastectomy and axillary lymph node dissection 3.Since the final path was HER-2 positive she will get 1 year of Herceptin 4.Adjuvantradiation 5.Followed by adjuvant antiestrogen therapy ----------------------------------------------------------- 11/20/2018:Right mastectomy: Residual grade 2 invasive ductal carcinoma multifocal, largest 1.8 cm, intermediate grade DCIS, margins negative, lymphovascular space invasion present, 0/6 lymph nodes negative, ER 60%, PR 60%, HER-2 positive ratio 2.59  Current treatment: Herceptin maintenance every 3 weeks  Herceptin toxicities: None Echocardiogram 12/13/2018: EF 55 to 60%: Normal  Return to clinic every 3 weeks for Herceptin every 6 weeks of follow-up with me.

## 2019-03-28 NOTE — Patient Instructions (Signed)
[facility] °Discharge Instructions for Patients Receiving Chemotherapy ° °Today you received the following chemotherapy agents Trastuzumab ° °To help prevent nausea and vomiting after your treatment, we encourage you to take your nausea medication as directed. °  °If you develop nausea and vomiting that is not controlled by your nausea medication, call the clinic.  ° °BELOW ARE SYMPTOMS THAT SHOULD BE REPORTED IMMEDIATELY: °· *FEVER GREATER THAN 100.5 F °· *CHILLS WITH OR WITHOUT FEVER °· NAUSEA AND VOMITING THAT IS NOT CONTROLLED WITH YOUR NAUSEA MEDICATION °· *UNUSUAL SHORTNESS OF BREATH °· *UNUSUAL BRUISING OR BLEEDING °· TENDERNESS IN MOUTH AND THROAT WITH OR WITHOUT PRESENCE OF ULCERS °· *URINARY PROBLEMS °· *BOWEL PROBLEMS °· UNUSUAL RASH °Items with * indicate a potential emergency and should be followed up as soon as possible. ° °Feel free to call the clinic should you have any questions or concerns. The clinic phone number is [phone]. ° °Please show the CHEMO ALERT CARD at check-in to the Emergency Department and triage nurse. ° ° °

## 2019-03-29 ENCOUNTER — Telehealth: Payer: Self-pay | Admitting: Hematology and Oncology

## 2019-03-29 NOTE — Telephone Encounter (Signed)
No los °

## 2019-04-11 ENCOUNTER — Other Ambulatory Visit: Payer: Self-pay

## 2019-04-11 DIAGNOSIS — C50211 Malignant neoplasm of upper-inner quadrant of right female breast: Secondary | ICD-10-CM

## 2019-04-11 DIAGNOSIS — Z170 Estrogen receptor positive status [ER+]: Secondary | ICD-10-CM

## 2019-04-15 ENCOUNTER — Ambulatory Visit (HOSPITAL_COMMUNITY)
Admission: RE | Admit: 2019-04-15 | Discharge: 2019-04-15 | Disposition: A | Discharge status: Home / Self Care | Payer: BC Managed Care – PPO | Source: Ambulatory Visit | Attending: Hematology and Oncology | Admitting: Hematology and Oncology

## 2019-04-15 ENCOUNTER — Other Ambulatory Visit: Payer: Self-pay

## 2019-04-15 DIAGNOSIS — I083 Combined rheumatic disorders of mitral, aortic and tricuspid valves: Secondary | ICD-10-CM | POA: Insufficient documentation

## 2019-04-15 DIAGNOSIS — Z170 Estrogen receptor positive status [ER+]: Secondary | ICD-10-CM | POA: Diagnosis not present

## 2019-04-15 DIAGNOSIS — C50211 Malignant neoplasm of upper-inner quadrant of right female breast: Secondary | ICD-10-CM | POA: Insufficient documentation

## 2019-04-15 NOTE — Progress Notes (Signed)
  Echocardiogram 2D Echocardiogram has been performed.  Bobbye Charleston 04/15/2019, 12:06 PM

## 2019-04-17 ENCOUNTER — Other Ambulatory Visit (HOSPITAL_COMMUNITY): Payer: BC Managed Care – PPO

## 2019-04-18 ENCOUNTER — Inpatient Hospital Stay: Payer: BC Managed Care – PPO

## 2019-04-18 ENCOUNTER — Other Ambulatory Visit: Payer: Self-pay

## 2019-04-18 VITALS — BP 151/109 | HR 82 | Temp 98.2°F | Resp 16

## 2019-04-18 DIAGNOSIS — C50211 Malignant neoplasm of upper-inner quadrant of right female breast: Secondary | ICD-10-CM

## 2019-04-18 DIAGNOSIS — Z170 Estrogen receptor positive status [ER+]: Secondary | ICD-10-CM

## 2019-04-18 DIAGNOSIS — Z5112 Encounter for antineoplastic immunotherapy: Secondary | ICD-10-CM | POA: Diagnosis not present

## 2019-04-18 MED ORDER — SODIUM CHLORIDE 0.9 % IV SOLN
Freq: Once | INTRAVENOUS | Status: AC
  Administered 2019-04-18: 10:00:00 via INTRAVENOUS
  Filled 2019-04-18: qty 250

## 2019-04-18 MED ORDER — ACETAMINOPHEN 325 MG PO TABS
650.0000 mg | ORAL_TABLET | Freq: Once | ORAL | Status: AC
  Administered 2019-04-18: 650 mg via ORAL

## 2019-04-18 MED ORDER — TRASTUZUMAB-DKST CHEMO 150 MG IV SOLR
6.0000 mg/kg | Freq: Once | INTRAVENOUS | Status: AC
  Administered 2019-04-18: 399 mg via INTRAVENOUS
  Filled 2019-04-18: qty 19

## 2019-04-18 MED ORDER — ACETAMINOPHEN 325 MG PO TABS
ORAL_TABLET | ORAL | Status: AC
  Filled 2019-04-18: qty 2

## 2019-04-18 MED ORDER — DIPHENHYDRAMINE HCL 25 MG PO CAPS
ORAL_CAPSULE | ORAL | Status: AC
  Filled 2019-04-18: qty 2

## 2019-04-18 MED ORDER — DIPHENHYDRAMINE HCL 25 MG PO CAPS
50.0000 mg | ORAL_CAPSULE | Freq: Once | ORAL | Status: AC
  Administered 2019-04-18: 50 mg via ORAL

## 2019-04-18 NOTE — Patient Instructions (Signed)
[facility] Discharge Instructions for Patients Receiving Chemotherapy  Today you received the following chemotherapy agents:  Ogivri  To help prevent nausea and vomiting after your treatment, we encourage you to take your nausea medication as prescribed.   If you develop nausea and vomiting that is not controlled by your nausea medication, call the clinic.   BELOW ARE SYMPTOMS THAT SHOULD BE REPORTED IMMEDIATELY:  *FEVER GREATER THAN 100.5 F  *CHILLS WITH OR WITHOUT FEVER  NAUSEA AND VOMITING THAT IS NOT CONTROLLED WITH YOUR NAUSEA MEDICATION  *UNUSUAL SHORTNESS OF BREATH  *UNUSUAL BRUISING OR BLEEDING  TENDERNESS IN MOUTH AND THROAT WITH OR WITHOUT PRESENCE OF ULCERS  *URINARY PROBLEMS  *BOWEL PROBLEMS  UNUSUAL RASH Items with * indicate a potential emergency and should be followed up as soon as possible.  Feel free to call the clinic should you have any questions or concerns. The clinic phone number is [phone].  Please show the [facility] at check-in to the Emergency Department and triage nurse.   Coronavirus (COVID-19) Are you at risk?  Are you at risk for the Coronavirus (COVID-19)?  To be considered HIGH RISK for Coronavirus (COVID-19), you have to meet the following criteria:  . Traveled to [place], [place], [place], [place] or [place]; or in the [place] to [place], [place], [place], or [place]; and have fever, cough, and shortness of breath within the last 2 weeks of travel OR . Been in close contact with a person diagnosed with COVID-19 within the last 2 weeks and have fever, cough, and shortness of breath . IF YOU DO NOT MEET THESE CRITERIA, YOU ARE CONSIDERED LOW RISK FOR COVID-19.  What to do if you are HIGH RISK for COVID-19?  Marland Kitchen If you are having a medical emergency, call 911. . Seek medical care right away. Before you go to a doctor's office, urgent care or emergency department, call ahead and tell them about your  recent travel, contact with someone diagnosed with COVID-19, and your symptoms. You should receive instructions from your physician's office regarding next steps of care.  . When you arrive at healthcare provider, tell the healthcare staff immediately you have returned from visiting [place], [place], [place], [place] or [place]; or traveled in the [place] to [place], [place], [place], or [place]; in the last two weeks or you have been in close contact with a person diagnosed with COVID-19 in the last 2 weeks.   . Tell the health care staff about your symptoms: fever, cough and shortness of breath. . After you have been seen by a medical provider, you will be either: o Tested for (COVID-19) and discharged home on quarantine except to seek medical care if symptoms worsen, and asked to  - Stay home and avoid contact with others until you get your results (4-5 days)  - Avoid travel on public transportation if possible (such as bus, train, or airplane) or o Sent to the Emergency Department by EMS for evaluation, COVID-19 testing, and possible admission depending on your condition and test results.  What to do if you are LOW RISK for COVID-19?  Reduce your risk of any infection by using the same precautions used for avoiding the common cold or flu:  Marland Kitchen Wash your hands often with soap and warm water for at least 20 seconds.  If soap and water are not readily available, use an alcohol-based hand sanitizer with at least 60% alcohol.  . If coughing  or sneezing, cover your mouth and nose by coughing or sneezing into the elbow areas of your shirt or coat, into a tissue or into your sleeve (not your hands). . Avoid shaking hands with others and consider head nods or verbal greetings only. . Avoid touching your eyes, nose, or mouth with unwashed hands.  . Avoid close contact with people who are sick. . Avoid places or events with large numbers of people in one location, like concerts or sporting  events. . Carefully consider travel plans you have or are making. . If you are planning any travel outside or inside the [place], visit the CDC's Travelers' Health webpage for the latest health notices. . If you have some symptoms but not all symptoms, continue to monitor at home and seek medical attention if your symptoms worsen. . If you are having a medical emergency, call 911.   ADDITIONAL HEALTHCARE OPTIONS FOR PATIENTS   Telehealth / e-Visit: [email or web address]         [facility]: [phone]  [facility]: [phone]                   [facility]: [phone]     

## 2019-04-28 ENCOUNTER — Encounter: Payer: Self-pay | Admitting: *Deleted

## 2019-05-08 NOTE — Progress Notes (Signed)
Patient Care Team: [name], DO as PCP - General (Family Medicine) [name], MD as Consulting Physician (General Surgery) [name], MD as Consulting Physician (Hematology and Oncology) [name], RN as Oncology Nurse Navigator [name], RN as Oncology Nurse Navigator  DIAGNOSIS:    ICD-10-CM   1. Malignant neoplasm of upper-inner quadrant of right breast in female, estrogen receptor positive ([place])  C50.211    Z17.0     SUMMARY OF ONCOLOGIC HISTORY: Oncology History  Malignant neoplasm of upper-inner quadrant of right breast in female, estrogen receptor positive ([place])  04/17/2018 Initial Diagnosis   Palpable right breast masses UOQ and UIQ with multiple masses 12 to 14 cm, at 1:00 1.6 cm, 1231.8 cm, and o'clock 4.5 cm and at 9:30 position 5 cm: Biopsy revealed IDC with DCIS, grade 3, ER 80%, PR 0%, HER-2 negative, Ki-67 20%; biopsy of the 4.5 cm mass came back as IDC grade 2-3, ER 50%, PR 5%, HER-2 -2+ ratio 1.6, copy #3.2, Ki-67 20%, T2N1 stage IIb   05/09/2018 - 10/08/2018 Neo-Adjuvant Chemotherapy   Neoadjuvant chemotherapy with dose dense Adriamycin and Cytoxan x4 followed by Taxol weekly x12   06/19/2018 Genetic Testing   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  The Multi-Gene Panel offered by [facility] includes sequencing and/or deletion duplication testing of the following 84 genes: AIP, ALK, APC, ATM, AXIN2,BAP1,  BARD1, BLM, BMPR1A, BRCA1, BRCA2, BRIP1, CASR, CDC73, CDH1, CDK4, CDKN1B, CDKN1C, CDKN2A (p14ARF), CDKN2A (p16INK4a), CEBPA, CHEK2, CTNNA1, DICER1, DIS3L2, EGFR (c.2369C>T, p.Thr790Met variant only), EPCAM (Deletion/duplication testing only), FH, FLCN, GATA2, GPC3, GREM1 (Promoter region deletion/duplication testing only), HOXB13 (c.251G>A, p.Gly84Glu), HRAS, KIT, MAX, MEN1, MET, MITF (c.952G>A, p.Glu318Lys variant only), MLH1, MSH2, MSH3, MSH6, MUTYH, NBN, NF1, NF2, NTHL1, PALB2, PDGFRA,  PHOX2B, PMS2, POLD1, POLE, POT1, PRKAR1A, PTCH1, PTEN, RAD50, RAD51C, RAD51D, RB1, RECQL4, RET, RUNX1, SDHAF2, SDHA (sequence changes only), SDHB, SDHC, SDHD, SMAD4, SMARCA4, SMARCB1, SMARCE1, STK11, SUFU, TERC, TERT, TMEM127, TP53, TSC1, TSC2, VHL, WRN and WT1.  The report date is 06/19/2018.   10/11/2018 Breast MRI   Significant decrease in the size of the right axillary adenopathy and right breast masses from 1.2cm to 0.9cm with minimal residual enhancement, and 2.4x1.4cm to 2.3x1.9cm.   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, largest 1.8 cm, intermediate grade DCIS, margins negative, lymphovascular space invasion present, 0/6 lymph nodes negative, ER 60%, PR 60%, HER-2 positive ratio 2.59   11/20/2018 Cancer Staging   Staging form: Breast, AJCC 8th Edition - Pathologic stage from 11/20/2018: Stage IA (pT1c, pN0, cM0, G2, ER+, PR+, HER2+) - Signed by [name], NP on 12/05/2018   12/14/2018 -  Chemotherapy   The patient had trastuzumab (HERCEPTIN) 546 mg in sodium chloride 0.9 % 250 mL chemo infusion, 8 mg/kg = 546 mg, Intravenous,  Once, 3 of 3 cycles Administration: 546 mg (12/14/2018), 399 mg (01/03/2019), 399 mg (01/24/2019) trastuzumab-dkst (OGIVRI) 399 mg in sodium chloride 0.9 % 250 mL chemo infusion, 6 mg/kg = 399 mg (100 % of original dose 6 mg/kg), Intravenous,  Once, 4 of 14 cycles Dose modification: 6 mg/kg (original dose 6 mg/kg, Cycle 4, Reason: Other (see comments), Comment: Biosimilar Conversion) Administration: 399 mg (02/14/2019), 399 mg (03/07/2019), 399 mg (03/28/2019), 399 mg (04/18/2019)  for chemotherapy treatment.    01/01/2019 - 02/14/2019 Radiation Therapy   Adjuvant XRT to right chest wall     CHIEF COMPLIANT:  Herceptin maintenance  INTERVAL HISTORY: Miranda Ayers is a 33 y.o. with above-mentioned history of right breast cancer who completed neoadjuvant chemotherapy, underwent a right mastectomy and  radiation treatment. She is currently on adjuvant Herceptin maintenance and antiestrogen therapy with tamoxifen. Echo on 04/15/19 showed an ejection fraction of 55-60%.She presents to the clinic todayfor treatment.   She is tolerating tamoxifen extremely well.  She had mild epigastric discomfort for which she took Protonix which in stool symptoms.  REVIEW OF SYSTEMS:   Constitutional: Denies fevers, chills or abnormal weight loss Eyes: Denies blurriness of vision Ears, nose, mouth, throat, and face: Denies mucositis or sore throat Respiratory: Denies cough, dyspnea or wheezes Cardiovascular: Denies palpitation, chest discomfort Gastrointestinal: Denies nausea, heartburn or change in bowel habits Skin: Denies abnormal skin rashes Lymphatics: Denies new lymphadenopathy or easy bruising Neurological: Denies numbness, tingling or new weaknesses Behavioral/Psych: Mood is stable, no new changes  Extremities: No lower extremity edema Breast: denies any pain or lumps or nodules in either breasts All other systems were reviewed with the patient and are negative.  I have reviewed the past medical history, past surgical history, social history and family history with the patient and they are unchanged from previous note.  ALLERGIES:  has No Known Allergies.  MEDICATIONS:  Current Outpatient Medications  Medication Sig Dispense Refill   hydrochlorothiazide (HYDRODIURIL) 12.5 MG tablet Take 12.5 mg by mouth daily.     tamoxifen (NOLVADEX) 20 MG tablet Take 1 tablet (20 mg total) by mouth daily. 90 tablet 3   No current facility-administered medications for this visit.     PHYSICAL EXAMINATION: ECOG PERFORMANCE STATUS: 1 - Symptomatic but completely ambulatory  Vitals:   05/09/19 0911  BP: (!) 147/101  Pulse: 86  Resp: 18  Temp: 97.9 F (36.6 C)  SpO2: 100%   Filed Weights   05/09/19 0911  Weight: 153 lb 14.4 oz (69.8 kg)    GENERAL: alert, no distress and comfortable SKIN:  skin color, texture, turgor are normal, no rashes or significant lesions EYES: normal, Conjunctiva are pink and non-injected, sclera clear OROPHARYNX: no exudate, no erythema and lips, buccal mucosa, and tongue normal  NECK: supple, thyroid normal size, non-tender, without nodularity LYMPH: no palpable lymphadenopathy in the cervical, axillary or inguinal LUNGS: clear to auscultation and percussion with normal breathing effort HEART: regular rate & rhythm and no murmurs and no lower extremity edema ABDOMEN: abdomen soft, non-tender and normal bowel sounds MUSCULOSKELETAL: no cyanosis of digits and no clubbing  NEURO: alert & oriented x 3 with fluent speech, no focal motor/sensory deficits EXTREMITIES: No lower extremity edema  LABORATORY DATA:  I have reviewed the data as listed CMP Latest Ref Rng & Units 03/28/2019 11/16/2018 10/09/2018  Glucose 70 - 99 mg/dL 94 82 85  BUN 6 - 20 mg/dL 12 7 5(L)  Creatinine 0.44 - 1.00 mg/dL 0.76 0.74 0.67  Sodium 135 - 145 mmol/L 142 141 140  Potassium 3.5 - 5.1 mmol/L 3.3(L) 4.8 4.1  Chloride 98 - 111 mmol/L 108 105 106  CO2 22 - 32 mmol/L 25 28 24   Calcium 8.9 - 10.3 mg/dL 9.4 10.2 9.7  Total Protein 6.5 - 8.1 g/dL 7.5 7.6 7.4  Total Bilirubin 0.3 - 1.2 mg/dL 0.3 0.8 0.3  Alkaline Phos 38 - 126 U/L 77 60 68  AST 15 - 41 U/L 19 21 18   ALT 0 - 44 U/L 19 19 18     Lab Results  Component Value Date   WBC 4.9  03/28/2019   HGB 12.8 03/28/2019   HCT 40.2 03/28/2019   MCV 86.8 03/28/2019   PLT 170 03/28/2019   NEUTROABS 3.5 03/28/2019    ASSESSMENT & PLAN:  Malignant neoplasm of upper-inner quadrant of right breast in female, estrogen receptor positive ([place]) 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%, PR 0%, HER-2 negative, Ki-67 20%; biopsy of the 4.5 cm mass came back as IDC grade 2-3,  ER 50%, PR 5%, HER-2 -2+ ratio 1.6, copy #3.2, Ki-67 20%, T2N1 stage IIb  Recommendation: 1.Neoadjuvant chemotherapy with dose dense Adriamycinand Cytoxan x4 followed by Taxol weekly x11 05/09/2018-10/02/2018 2.11/20/2018:Rightmastectomy and axillary lymph node dissection 3.Since the final path was HER-2 positive she will get 1 year of Herceptin which will be completed in October 2021 4.Adjuvantradiation 01/01/2019-02/14/2019 5.Followed by adjuvant antiestrogen therapy patient prefers tamoxifen starting 03/28/2019 ----------------------------------------------------------- 11/20/2018:Right mastectomy: Residual grade 2 invasive ductal carcinoma multifocal, largest 1.8 cm, intermediate grade DCIS, margins negative, lymphovascular space invasion present, 0/6 lymph nodes negative, ER 60%, PR 60%, HER-2 positive ratio 2.59  Current treatment: Herceptin maintenance every 3 weeks, tamoxifen 20 mg daily 03/28/2019  Herceptin toxicities: None Echocardiogram 04/15/2019: EF 55 to 60%: Normal Tamoxifen toxicities: None Mild epigastric discomfort which resolved with Protonix.  Return to clinic every 3 weeks for Herceptin every 6 weeks for follow-up with me     No orders of the defined types were placed in this encounter.  The patient has a good understanding of the overall plan. she agrees with it. she will call with any problems that may develop before the next visit here.  [name], MD 05/09/2019  Miranda Ayers, am acting as scribe for Dr. [name].  I have reviewed the above documentation for accuracy and completeness, and I agree with the above.

## 2019-05-09 ENCOUNTER — Other Ambulatory Visit: Payer: Self-pay

## 2019-05-09 ENCOUNTER — Inpatient Hospital Stay (HOSPITAL_BASED_OUTPATIENT_CLINIC_OR_DEPARTMENT_OTHER): Payer: BC Managed Care – PPO | Admitting: Hematology and Oncology

## 2019-05-09 ENCOUNTER — Ambulatory Visit: Payer: BC Managed Care – PPO

## 2019-05-09 ENCOUNTER — Inpatient Hospital Stay: Payer: BC Managed Care – PPO | Attending: Hematology and Oncology

## 2019-05-09 VITALS — BP 148/96

## 2019-05-09 DIAGNOSIS — Z5112 Encounter for antineoplastic immunotherapy: Secondary | ICD-10-CM | POA: Diagnosis present

## 2019-05-09 DIAGNOSIS — C50211 Malignant neoplasm of upper-inner quadrant of right female breast: Secondary | ICD-10-CM | POA: Diagnosis present

## 2019-05-09 DIAGNOSIS — Z9221 Personal history of antineoplastic chemotherapy: Secondary | ICD-10-CM | POA: Insufficient documentation

## 2019-05-09 DIAGNOSIS — Z923 Personal history of irradiation: Secondary | ICD-10-CM | POA: Diagnosis not present

## 2019-05-09 DIAGNOSIS — Z170 Estrogen receptor positive status [ER+]: Secondary | ICD-10-CM

## 2019-05-09 DIAGNOSIS — Z9011 Acquired absence of right breast and nipple: Secondary | ICD-10-CM | POA: Diagnosis not present

## 2019-05-09 DIAGNOSIS — Z79810 Long term (current) use of selective estrogen receptor modulators (SERMs): Secondary | ICD-10-CM | POA: Insufficient documentation

## 2019-05-09 DIAGNOSIS — Z79899 Other long term (current) drug therapy: Secondary | ICD-10-CM | POA: Insufficient documentation

## 2019-05-09 MED ORDER — ACETAMINOPHEN 325 MG PO TABS
ORAL_TABLET | ORAL | Status: AC
  Filled 2019-05-09: qty 2

## 2019-05-09 MED ORDER — SODIUM CHLORIDE 0.9 % IV SOLN
Freq: Once | INTRAVENOUS | Status: AC
  Administered 2019-05-09: 10:00:00 via INTRAVENOUS
  Filled 2019-05-09: qty 250

## 2019-05-09 MED ORDER — SODIUM CHLORIDE 0.9% FLUSH
10.0000 mL | INTRAVENOUS | Status: DC | PRN
  Filled 2019-05-09: qty 10

## 2019-05-09 MED ORDER — DIPHENHYDRAMINE HCL 25 MG PO CAPS
ORAL_CAPSULE | ORAL | Status: AC
  Filled 2019-05-09: qty 2

## 2019-05-09 MED ORDER — DIPHENHYDRAMINE HCL 25 MG PO CAPS
50.0000 mg | ORAL_CAPSULE | Freq: Once | ORAL | Status: AC
  Administered 2019-05-09: 50 mg via ORAL

## 2019-05-09 MED ORDER — HEPARIN SOD (PORK) LOCK FLUSH 100 UNIT/ML IV SOLN
500.0000 [IU] | Freq: Once | INTRAVENOUS | Status: DC | PRN
  Filled 2019-05-09: qty 5

## 2019-05-09 MED ORDER — TRASTUZUMAB-DKST CHEMO 150 MG IV SOLR
6.0000 mg/kg | Freq: Once | INTRAVENOUS | Status: AC
  Administered 2019-05-09: 11:00:00 399 mg via INTRAVENOUS
  Filled 2019-05-09: qty 19

## 2019-05-09 MED ORDER — ACETAMINOPHEN 325 MG PO TABS
650.0000 mg | ORAL_TABLET | Freq: Once | ORAL | Status: AC
  Administered 2019-05-09: 10:00:00 650 mg via ORAL

## 2019-05-09 NOTE — Assessment & Plan Note (Signed)
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%, PR 0%, HER-2 negative, Ki-67 20%; biopsy of the 4.5 cm mass came back as IDC grade 2-3, ER 50%, PR 5%, HER-2 -2+ ratio 1.6, copy #3.2, Ki-67 20%, T2N1 stage IIb  Recommendation: 1.Neoadjuvant chemotherapy with dose dense Adriamycinand Cytoxan x4 followed by Taxol weekly x11 05/09/2018-10/02/2018 2.11/20/2018:Rightmastectomy and axillary lymph node dissection 3.Since the final path was HER-2 positive she will get 1 year of Herceptin which will be completed in October 2021 4.Adjuvantradiation 01/01/2019-02/14/2019 5.Followed by adjuvant antiestrogen therapy patient prefers tamoxifen starting 03/28/2019 ----------------------------------------------------------- 11/20/2018:Right mastectomy: Residual grade 2 invasive ductal carcinoma multifocal, largest 1.8 cm, intermediate grade DCIS, margins negative, lymphovascular space invasion present, 0/6 lymph nodes negative, ER 60%, PR 60%, HER-2 positive ratio 2.59  Current treatment: Herceptin maintenance every 3 weeks, tamoxifen 20 mg daily 03/28/2019  Herceptin toxicities: None Echocardiogram 04/15/2019: EF 55 to 60%: Normal Tamoxifen toxicities:  Return to clinic every 3 weeks for Herceptin every 6 weeks for follow-up with me

## 2019-05-09 NOTE — Patient Instructions (Signed)
[facility] °Discharge Instructions for Patients Receiving Chemotherapy ° °Today you received the following chemotherapy agents Trastuzumab ° °To help prevent nausea and vomiting after your treatment, we encourage you to take your nausea medication as directed. °  °If you develop nausea and vomiting that is not controlled by your nausea medication, call the clinic.  ° °BELOW ARE SYMPTOMS THAT SHOULD BE REPORTED IMMEDIATELY: °· *FEVER GREATER THAN 100.5 F °· *CHILLS WITH OR WITHOUT FEVER °· NAUSEA AND VOMITING THAT IS NOT CONTROLLED WITH YOUR NAUSEA MEDICATION °· *UNUSUAL SHORTNESS OF BREATH °· *UNUSUAL BRUISING OR BLEEDING °· TENDERNESS IN MOUTH AND THROAT WITH OR WITHOUT PRESENCE OF ULCERS °· *URINARY PROBLEMS °· *BOWEL PROBLEMS °· UNUSUAL RASH °Items with * indicate a potential emergency and should be followed up as soon as possible. ° °Feel free to call the clinic should you have any questions or concerns. The clinic phone number is [phone]. ° °Please show the CHEMO ALERT CARD at check-in to the Emergency Department and triage nurse. ° ° °

## 2019-05-30 ENCOUNTER — Ambulatory Visit: Payer: BC Managed Care – PPO | Admitting: Hematology and Oncology

## 2019-05-30 ENCOUNTER — Ambulatory Visit: Payer: BC Managed Care – PPO

## 2019-05-30 ENCOUNTER — Other Ambulatory Visit: Payer: Self-pay | Admitting: Hematology and Oncology

## 2019-05-30 ENCOUNTER — Other Ambulatory Visit: Payer: Self-pay

## 2019-05-30 ENCOUNTER — Inpatient Hospital Stay: Payer: BC Managed Care – PPO | Attending: Hematology and Oncology

## 2019-05-30 VITALS — BP 157/87 | HR 99 | Temp 98.5°F | Resp 16 | Wt 155.5 lb

## 2019-05-30 DIAGNOSIS — C50211 Malignant neoplasm of upper-inner quadrant of right female breast: Secondary | ICD-10-CM | POA: Diagnosis present

## 2019-05-30 DIAGNOSIS — N951 Menopausal and female climacteric states: Secondary | ICD-10-CM | POA: Diagnosis not present

## 2019-05-30 DIAGNOSIS — Z170 Estrogen receptor positive status [ER+]: Secondary | ICD-10-CM | POA: Insufficient documentation

## 2019-05-30 DIAGNOSIS — Z5112 Encounter for antineoplastic immunotherapy: Secondary | ICD-10-CM | POA: Insufficient documentation

## 2019-05-30 DIAGNOSIS — Z79810 Long term (current) use of selective estrogen receptor modulators (SERMs): Secondary | ICD-10-CM | POA: Insufficient documentation

## 2019-05-30 MED ORDER — DIPHENHYDRAMINE HCL 25 MG PO CAPS
50.0000 mg | ORAL_CAPSULE | Freq: Once | ORAL | Status: AC
  Administered 2019-05-30: 10:00:00 50 mg via ORAL

## 2019-05-30 MED ORDER — ACETAMINOPHEN 325 MG PO TABS
ORAL_TABLET | ORAL | Status: AC
  Filled 2019-05-30: qty 2

## 2019-05-30 MED ORDER — ACETAMINOPHEN 325 MG PO TABS
650.0000 mg | ORAL_TABLET | Freq: Once | ORAL | Status: AC
  Administered 2019-05-30: 650 mg via ORAL

## 2019-05-30 MED ORDER — TRASTUZUMAB-DKST CHEMO 150 MG IV SOLR
6.0000 mg/kg | Freq: Once | INTRAVENOUS | Status: AC
  Administered 2019-05-30: 399 mg via INTRAVENOUS
  Filled 2019-05-30: qty 19

## 2019-05-30 MED ORDER — DIPHENHYDRAMINE HCL 25 MG PO CAPS
ORAL_CAPSULE | ORAL | Status: AC
  Filled 2019-05-30: qty 2

## 2019-05-30 MED ORDER — SODIUM CHLORIDE 0.9 % IV SOLN
Freq: Once | INTRAVENOUS | Status: AC
  Administered 2019-05-30: 09:00:00 via INTRAVENOUS
  Filled 2019-05-30: qty 250

## 2019-05-30 NOTE — Patient Instructions (Signed)
[facility] Discharge Instructions for Patients Receiving Chemotherapy  Today you received the following chemotherapy agents: Ogivri   To help prevent nausea and vomiting after your treatment, we encourage you to take your nausea medication as directed.    If you develop nausea and vomiting that is not controlled by your nausea medication, call the clinic.   BELOW ARE SYMPTOMS THAT SHOULD BE REPORTED IMMEDIATELY:  *FEVER GREATER THAN 100.5 F  *CHILLS WITH OR WITHOUT FEVER  NAUSEA AND VOMITING THAT IS NOT CONTROLLED WITH YOUR NAUSEA MEDICATION  *UNUSUAL SHORTNESS OF BREATH  *UNUSUAL BRUISING OR BLEEDING  TENDERNESS IN MOUTH AND THROAT WITH OR WITHOUT PRESENCE OF ULCERS  *URINARY PROBLEMS  *BOWEL PROBLEMS  UNUSUAL RASH Items with * indicate a potential emergency and should be followed up as soon as possible.  Feel free to call the clinic should you have any questions or concerns. The clinic phone number is [phone].  Please show the CHEMO ALERT CARD at check-in to the Emergency Department and triage nurse.   

## 2019-06-19 NOTE — Progress Notes (Signed)
 Patient Care Team: Mumaw, Elizabeth Woodland, DO as PCP - General (Family Medicine) Cornett, Thomas, MD as Consulting Physician (General Surgery) Gudena, Vinay, MD as Consulting Physician (Hematology and Oncology) Stuart, Dawn C, RN as Oncology Nurse Navigator Martini, Keisha N, RN as Oncology Nurse Navigator  DIAGNOSIS:    ICD-10-CM   1. Malignant neoplasm of upper-inner quadrant of right breast in female, estrogen receptor positive ([facility])  C50.211    Z17.0     SUMMARY OF ONCOLOGIC HISTORY: Oncology History  Malignant neoplasm of upper-inner quadrant of right breast in female, estrogen receptor positive ([facility])  04/17/2018 Initial Diagnosis   Palpable right breast masses UOQ and UIQ with multiple masses 12 to 14 cm, at 1:00 1.6 cm, 1231.8 cm, and o'clock 4.5 cm and at 9:30 position 5 cm: Biopsy revealed IDC with DCIS, grade 3, ER 80%, PR 0%, HER-2 negative, Ki-67 20%; biopsy of the 4.5 cm mass came back as IDC grade 2-3, ER 50%, PR 5%, HER-2 -2+ ratio 1.6, copy #3.2, Ki-67 20%, T2N1 stage IIb   05/09/2018 - 10/08/2018 Neo-Adjuvant Chemotherapy   Neoadjuvant chemotherapy with dose dense Adriamycin and Cytoxan x4 followed by Taxol weekly x12   06/19/2018 Genetic Testing   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  The Multi-Gene Panel offered by [facility] includes sequencing and/or deletion duplication testing of the following 84 genes: AIP, ALK, APC, ATM, AXIN2,BAP1,  BARD1, BLM, BMPR1A, BRCA1, BRCA2, BRIP1, CASR, CDC73, CDH1, CDK4, CDKN1B, CDKN1C, CDKN2A (p14ARF), CDKN2A (p16INK4a), CEBPA, CHEK2, CTNNA1, DICER1, DIS3L2, EGFR (c.2369C>T, p.Thr790Met variant only), EPCAM (Deletion/duplication testing only), FH, FLCN, GATA2, GPC3, GREM1 (Promoter region deletion/duplication testing only), HOXB13 (c.251G>A, p.Gly84Glu), HRAS, KIT, MAX, MEN1, MET, MITF (c.952G>A, p.Glu318Lys variant only), MLH1, MSH2, MSH3, MSH6, MUTYH, NBN, NF1, NF2, NTHL1, PALB2, PDGFRA,  PHOX2B, PMS2, POLD1, POLE, POT1, PRKAR1A, PTCH1, PTEN, RAD50, RAD51C, RAD51D, RB1, RECQL4, RET, RUNX1, SDHAF2, SDHA (sequence changes only), SDHB, SDHC, SDHD, SMAD4, SMARCA4, SMARCB1, SMARCE1, STK11, SUFU, TERC, TERT, TMEM127, TP53, TSC1, TSC2, VHL, WRN and WT1.  The report date is 06/19/2018.   10/11/2018 Breast MRI   Significant decrease in the size of the right axillary adenopathy and right breast masses from 1.2cm to 0.9cm with minimal residual enhancement, and 2.4x1.4cm to 2.3x1.9cm.   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, largest 1.8 cm, intermediate grade DCIS, margins negative, lymphovascular space invasion present, 0/6 lymph nodes negative, ER 60%, PR 60%, HER-2 positive ratio 2.59   11/20/2018 Cancer Staging   Staging form: Breast, AJCC 8th Edition - Pathologic stage from 11/20/2018: Stage IA (pT1c, pN0, cM0, G2, ER+, PR+, HER2+) - Signed by Causey, Lindsey Cornetto, NP on 12/05/2018   12/14/2018 -  Chemotherapy   The patient had trastuzumab (HERCEPTIN) 546 mg in sodium chloride 0.9 % 250 mL chemo infusion, 8 mg/kg = 546 mg, Intravenous,  Once, 3 of 3 cycles Administration: 546 mg (12/14/2018), 399 mg (01/03/2019), 399 mg (01/24/2019) trastuzumab-dkst (OGIVRI) 399 mg in sodium chloride 0.9 % 250 mL chemo infusion, 6 mg/kg = 399 mg (100 % of original dose 6 mg/kg), Intravenous,  Once, 6 of 14 cycles Dose modification: 6 mg/kg (original dose 6 mg/kg, Cycle 4, Reason: Other (see comments), Comment: Biosimilar Conversion) Administration: 399 mg (02/14/2019), 399 mg (03/07/2019), 399 mg (03/28/2019), 399 mg (04/18/2019), 399 mg (05/09/2019), 399 mg (05/30/2019)  for chemotherapy treatment.    01/01/2019 - 02/14/2019 Radiation Therapy   Adjuvant XRT to right chest wall       CHIEF COMPLIANT: Herceptin maintenance  INTERVAL HISTORY: Miranda Ayers is a 33 y.o. with above-mentioned history of right breast cancer who completed neoadjuvant  chemotherapy,underwent a right mastectomy, radiation andis currently onadjuvant Herceptin maintenance and antiestrogen therapy with tamoxifen. She presents to the clinic todayfor treatment.  REVIEW OF SYSTEMS:   Constitutional: Denies fevers, chills or abnormal weight loss Eyes: Denies blurriness of vision Ears, nose, mouth, throat, and face: Denies mucositis or sore throat Respiratory: Denies cough, dyspnea or wheezes Cardiovascular: Denies palpitation, chest discomfort Gastrointestinal: Denies nausea, heartburn or change in bowel habits Skin: Denies abnormal skin rashes Lymphatics: Denies new lymphadenopathy or easy bruising Neurological: Denies numbness, tingling or new weaknesses Behavioral/Psych: Mood is stable, no new changes  Extremities: No lower extremity edema Breast: denies any pain or lumps or nodules in either breasts All other systems were reviewed with the patient and are negative.  I have reviewed the past medical history, past surgical history, social history and family history with the patient and they are unchanged from previous note.  ALLERGIES:  has No Known Allergies.  MEDICATIONS:  Current Outpatient Medications  Medication Sig Dispense Refill  . hydrochlorothiazide (HYDRODIURIL) 12.5 MG tablet Take 12.5 mg by mouth daily.    . tamoxifen (NOLVADEX) 20 MG tablet Take 1 tablet (20 mg total) by mouth daily. 90 tablet 3   No current facility-administered medications for this visit.    PHYSICAL EXAMINATION: ECOG PERFORMANCE STATUS: 1 - Symptomatic but completely ambulatory  Vitals:   06/20/19 0845  BP: (!) 145/99  Pulse: 95  Resp: 18  Temp: 98 F (36.7 C)  SpO2: 100%   Filed Weights   06/20/19 0845  Weight: 157 lb 8 oz (71.4 kg)    GENERAL: alert, no distress and comfortable SKIN: skin color, texture, turgor are normal, no rashes or significant lesions EYES: normal, Conjunctiva are pink and non-injected, sclera clear OROPHARYNX: no exudate, no  erythema and lips, buccal mucosa, and tongue normal  NECK: supple, thyroid normal size, non-tender, without nodularity LYMPH: no palpable lymphadenopathy in the cervical, axillary or inguinal LUNGS: clear to auscultation and percussion with normal breathing effort HEART: regular rate & rhythm and no murmurs and no lower extremity edema ABDOMEN: abdomen soft, non-tender and normal bowel sounds MUSCULOSKELETAL: no cyanosis of digits and no clubbing  NEURO: alert & oriented x 3 with fluent speech, no focal motor/sensory deficits EXTREMITIES: No lower extremity edema  LABORATORY DATA:  I have reviewed the data as listed CMP Latest Ref Rng & Units 03/28/2019 11/16/2018 10/09/2018  Glucose 70 - 99 mg/dL 94 82 85  BUN 6 - 20 mg/dL 12 7 5(L)  Creatinine 0.44 - 1.00 mg/dL 0.76 0.74 0.67  Sodium 135 - 145 mmol/L 142 141 140  Potassium 3.5 - 5.1 mmol/L 3.3(L) 4.8 4.1  Chloride 98 - 111 mmol/L 108 105 106  CO2 22 - 32 mmol/L _0 Calcium 8.9 - 10.3 mg/dL 9.4 10.2 9.7  Total Protein 6.5 - 8.1 g/dL 7.5 7.6 7.4  Total Bilirubin 0.3 - 1.2 mg/dL 0.3 0.8 0.3  Alkaline Phos 38 - 126 U/L 77 60 68  AST 15 - 41 U/L _1 ALT 0 - 44 U/L _2 Lab Results  Component Value Date   WBC 4.9 03/28/2019   HGB 12.8 03/28/2019   HCT 40.2 03/28/2019   MCV 86.8 03/28/2019   PLT 170 03/28/2019   NEUTROABS 3.5 03/28/2019    ASSESSMENT & PLAN:  Malignant neoplasm  of upper-inner quadrant of right breast in female, estrogen receptor positive ([facility]) 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%, PR 0%, HER-2 negative, Ki-67 20%; biopsy of the 4.5 cm mass came back as IDC grade 2-3, ER 50%, PR 5%, HER-2 -2+ ratio 1.6, copy #3.2, Ki-67 20%, T2N1 stage IIb  Recommendation: 1.Neoadjuvant chemotherapy with dose dense Adriamycinand Cytoxan x4  followed by Taxol weekly x11 05/09/2018-10/02/2018 2.11/20/2018:Rightmastectomy and axillary lymph node dissection 3.Since the final path was HER-2 positive she will get 1 year of Herceptinwhich will be completed in October 2021 4.Adjuvantradiation7/14/2020-02/14/2019 5.Followed by adjuvant antiestrogen therapypatient prefers tamoxifen starting 03/28/2019 ----------------------------------------------------------- 11/20/2018:Right mastectomy: Residual grade 2 invasive ductal carcinoma multifocal, largest 1.8 cm, intermediate grade DCIS, margins negative, lymphovascular space invasion present, 0/6 lymph nodes negative, ER 60%, PR 60%, HER-2 positive ratio 2.59  Current treatment: Herceptin maintenance every 3 weeks, tamoxifen 20 mg daily 03/28/2019  Herceptin toxicities: None Echocardiogram 04/15/2019: EF 55 to 60%: Normal Tamoxifen toxicities:  Hot flashes but she is able to tolerate them Mild epigastric discomfort which resolved with Protonix.  Return to clinic every 3 weeks for Herceptin every 6 weeks for follow-up with me    No orders of the defined types were placed in this encounter.  The patient has a good understanding of the overall plan. she agrees with it. she will call with any problems that may develop before the next visit here.  Gudena, Vinay, MD 06/20/2019  I, Molly Dorshimer, am acting as scribe for Dr. Vinay Gudena.  I have reviewed the above document for accuracy and completeness, and I agree with the above.       

## 2019-06-19 NOTE — Assessment & Plan Note (Signed)
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%, PR 0%, HER-2 negative, Ki-67 20%; biopsy of the 4.5 cm mass came back as IDC grade 2-3, ER 50%, PR 5%, HER-2 -2+ ratio 1.6, copy #3.2, Ki-67 20%, T2N1 stage IIb  Recommendation: 1.Neoadjuvant chemotherapy with dose dense Adriamycinand Cytoxan x4 followed by Taxol weekly x11 05/09/2018-10/02/2018 2.11/20/2018:Rightmastectomy and axillary lymph node dissection 3.Since the final path was HER-2 positive she will get 1 year of Herceptinwhich will be completed in October 2021 4.Adjuvantradiation7/14/2020-02/14/2019 5.Followed by adjuvant antiestrogen therapypatient prefers tamoxifen starting 03/28/2019 ----------------------------------------------------------- 11/20/2018:Right mastectomy: Residual grade 2 invasive ductal carcinoma multifocal, largest 1.8 cm, intermediate grade DCIS, margins negative, lymphovascular space invasion present, 0/6 lymph nodes negative, ER 60%, PR 60%, HER-2 positive ratio 2.59  Current treatment: Herceptin maintenance every 3 weeks, tamoxifen 20 mg daily 03/28/2019  Herceptin toxicities: None Echocardiogram 04/15/2019: EF 55 to 60%: Normal Tamoxifen toxicities: None Mild epigastric discomfort which resolved with Protonix.  Return to clinic every 3 weeks for Herceptin every 6 weeks for follow-up with me

## 2019-06-20 ENCOUNTER — Inpatient Hospital Stay (HOSPITAL_BASED_OUTPATIENT_CLINIC_OR_DEPARTMENT_OTHER): Payer: BC Managed Care – PPO | Admitting: Hematology and Oncology

## 2019-06-20 ENCOUNTER — Other Ambulatory Visit: Payer: Self-pay

## 2019-06-20 ENCOUNTER — Encounter: Payer: Self-pay | Admitting: *Deleted

## 2019-06-20 ENCOUNTER — Ambulatory Visit: Payer: BC Managed Care – PPO

## 2019-06-20 ENCOUNTER — Inpatient Hospital Stay: Payer: BC Managed Care – PPO

## 2019-06-20 VITALS — BP 142/96 | Temp 98.0°F | Resp 18

## 2019-06-20 DIAGNOSIS — C50211 Malignant neoplasm of upper-inner quadrant of right female breast: Secondary | ICD-10-CM | POA: Diagnosis not present

## 2019-06-20 DIAGNOSIS — Z5112 Encounter for antineoplastic immunotherapy: Secondary | ICD-10-CM | POA: Diagnosis not present

## 2019-06-20 DIAGNOSIS — Z170 Estrogen receptor positive status [ER+]: Secondary | ICD-10-CM

## 2019-06-20 MED ORDER — DIPHENHYDRAMINE HCL 25 MG PO CAPS
ORAL_CAPSULE | ORAL | Status: AC
  Filled 2019-06-20: qty 2

## 2019-06-20 MED ORDER — SODIUM CHLORIDE 0.9 % IV SOLN
Freq: Once | INTRAVENOUS | Status: AC
  Filled 2019-06-20: qty 250

## 2019-06-20 MED ORDER — DIPHENHYDRAMINE HCL 25 MG PO CAPS
50.0000 mg | ORAL_CAPSULE | Freq: Once | ORAL | Status: AC
  Administered 2019-06-20: 10:00:00 50 mg via ORAL

## 2019-06-20 MED ORDER — ACETAMINOPHEN 325 MG PO TABS
650.0000 mg | ORAL_TABLET | Freq: Once | ORAL | Status: AC
  Administered 2019-06-20: 650 mg via ORAL

## 2019-06-20 MED ORDER — ACETAMINOPHEN 325 MG PO TABS
ORAL_TABLET | ORAL | Status: AC
  Filled 2019-06-20: qty 2

## 2019-06-20 MED ORDER — TRASTUZUMAB-DKST CHEMO 150 MG IV SOLR
6.0000 mg/kg | Freq: Once | INTRAVENOUS | Status: AC
  Administered 2019-06-20: 11:00:00 399 mg via INTRAVENOUS
  Filled 2019-06-20: qty 19

## 2019-06-20 NOTE — Patient Instructions (Signed)
[facility] °Discharge Instructions for Patients Receiving Chemotherapy ° °Today you received the following chemotherapy agents Trastuzumab ° °To help prevent nausea and vomiting after your treatment, we encourage you to take your nausea medication as directed. °  °If you develop nausea and vomiting that is not controlled by your nausea medication, call the clinic.  ° °BELOW ARE SYMPTOMS THAT SHOULD BE REPORTED IMMEDIATELY: °· *FEVER GREATER THAN 100.5 F °· *CHILLS WITH OR WITHOUT FEVER °· NAUSEA AND VOMITING THAT IS NOT CONTROLLED WITH YOUR NAUSEA MEDICATION °· *UNUSUAL SHORTNESS OF BREATH °· *UNUSUAL BRUISING OR BLEEDING °· TENDERNESS IN MOUTH AND THROAT WITH OR WITHOUT PRESENCE OF ULCERS °· *URINARY PROBLEMS °· *BOWEL PROBLEMS °· UNUSUAL RASH °Items with * indicate a potential emergency and should be followed up as soon as possible. ° °Feel free to call the clinic should you have any questions or concerns. The clinic phone number is [phone]. ° °Please show the CHEMO ALERT CARD at check-in to the Emergency Department and triage nurse. ° ° °

## 2019-07-10 ENCOUNTER — Encounter: Payer: Self-pay | Admitting: *Deleted

## 2019-07-11 ENCOUNTER — Ambulatory Visit: Payer: BC Managed Care – PPO | Admitting: Hematology and Oncology

## 2019-07-11 ENCOUNTER — Other Ambulatory Visit: Payer: Self-pay

## 2019-07-11 ENCOUNTER — Inpatient Hospital Stay (HOSPITAL_BASED_OUTPATIENT_CLINIC_OR_DEPARTMENT_OTHER): Payer: BC Managed Care – PPO | Admitting: Medical

## 2019-07-11 ENCOUNTER — Inpatient Hospital Stay: Payer: BC Managed Care – PPO | Attending: Hematology and Oncology

## 2019-07-11 ENCOUNTER — Other Ambulatory Visit: Payer: Self-pay | Admitting: Medical

## 2019-07-11 VITALS — BP 154/100 | HR 95 | Temp 98.7°F | Resp 16

## 2019-07-11 DIAGNOSIS — C50211 Malignant neoplasm of upper-inner quadrant of right female breast: Secondary | ICD-10-CM | POA: Insufficient documentation

## 2019-07-11 DIAGNOSIS — Z5112 Encounter for antineoplastic immunotherapy: Secondary | ICD-10-CM | POA: Diagnosis not present

## 2019-07-11 DIAGNOSIS — Z170 Estrogen receptor positive status [ER+]: Secondary | ICD-10-CM

## 2019-07-11 MED ORDER — ACETAMINOPHEN 325 MG PO TABS
650.0000 mg | ORAL_TABLET | Freq: Once | ORAL | Status: AC
  Administered 2019-07-11: 650 mg via ORAL

## 2019-07-11 MED ORDER — TRASTUZUMAB-DKST CHEMO 150 MG IV SOLR
6.0000 mg/kg | Freq: Once | INTRAVENOUS | Status: AC
  Administered 2019-07-11: 399 mg via INTRAVENOUS
  Filled 2019-07-11: qty 19

## 2019-07-11 MED ORDER — DIPHENHYDRAMINE HCL 25 MG PO CAPS
50.0000 mg | ORAL_CAPSULE | Freq: Once | ORAL | Status: AC
  Administered 2019-07-11: 50 mg via ORAL

## 2019-07-11 MED ORDER — DIPHENHYDRAMINE HCL 25 MG PO CAPS
ORAL_CAPSULE | ORAL | Status: AC
  Filled 2019-07-11: qty 2

## 2019-07-11 MED ORDER — ACETAMINOPHEN 325 MG PO TABS
ORAL_TABLET | ORAL | Status: AC
  Filled 2019-07-11: qty 2

## 2019-07-11 MED ORDER — FLUOCINOLONE ACETONIDE 0.025 % EX CREA: TOPICAL_CREAM | Freq: Three times a day (TID) | CUTANEOUS | 2 refills | Status: DC

## 2019-07-11 MED ORDER — SODIUM CHLORIDE 0.9 % IV SOLN
Freq: Once | INTRAVENOUS | Status: AC
  Filled 2019-07-11: qty 250

## 2019-07-11 NOTE — Progress Notes (Signed)
The patient was seen in the infusion room today as she was receiving Ogivri.  She reports having a history of eczema and is noted a rash in her bilateral anterior forearms and chest which has waxed and waned and is occasionally pruritic.  She has been using oatmeal lotion on the areas without complete resolution of her symptoms.  She was given a prescription for Synalar cream and was told to begin using oatmeal soap, lotion, and body wash.  She was also told that she could use either Allegra 60 mg twice daily or Zyrtec 10 mg once daily.  She expresses understanding and agreement with this plan.  Sandi Mealy, MHS, PA-C Physician Assistant

## 2019-07-11 NOTE — Patient Instructions (Signed)
[facility] Discharge Instructions for Patients Receiving Chemotherapy  Today you received the following chemotherapy agents: Ogivri   To help prevent nausea and vomiting after your treatment, we encourage you to take your nausea medication as directed.    If you develop nausea and vomiting that is not controlled by your nausea medication, call the clinic.   BELOW ARE SYMPTOMS THAT SHOULD BE REPORTED IMMEDIATELY:  *FEVER GREATER THAN 100.5 F  *CHILLS WITH OR WITHOUT FEVER  NAUSEA AND VOMITING THAT IS NOT CONTROLLED WITH YOUR NAUSEA MEDICATION  *UNUSUAL SHORTNESS OF BREATH  *UNUSUAL BRUISING OR BLEEDING  TENDERNESS IN MOUTH AND THROAT WITH OR WITHOUT PRESENCE OF ULCERS  *URINARY PROBLEMS  *BOWEL PROBLEMS  UNUSUAL RASH Items with * indicate a potential emergency and should be followed up as soon as possible.  Feel free to call the clinic should you have any questions or concerns. The clinic phone number is [phone].  Please show the CHEMO ALERT CARD at check-in to the Emergency Department and triage nurse.   

## 2019-07-22 ENCOUNTER — Telehealth: Payer: Self-pay | Admitting: Hematology and Oncology

## 2019-07-22 NOTE — Telephone Encounter (Signed)
Rescheduled per provider. Called and spoke with pt, confirmed 2/8 appt

## 2019-07-24 ENCOUNTER — Encounter: Payer: Self-pay | Admitting: *Deleted

## 2019-07-26 ENCOUNTER — Other Ambulatory Visit: Payer: Self-pay

## 2019-07-26 DIAGNOSIS — C50211 Malignant neoplasm of upper-inner quadrant of right female breast: Secondary | ICD-10-CM

## 2019-07-29 ENCOUNTER — Encounter: Payer: Self-pay | Admitting: Adult Health

## 2019-07-29 ENCOUNTER — Other Ambulatory Visit: Payer: Self-pay

## 2019-07-29 ENCOUNTER — Inpatient Hospital Stay: Payer: BC Managed Care – PPO | Attending: Adult Health

## 2019-07-29 ENCOUNTER — Inpatient Hospital Stay (HOSPITAL_BASED_OUTPATIENT_CLINIC_OR_DEPARTMENT_OTHER): Payer: BC Managed Care – PPO | Admitting: Adult Health

## 2019-07-29 DIAGNOSIS — Z5112 Encounter for antineoplastic immunotherapy: Secondary | ICD-10-CM | POA: Insufficient documentation

## 2019-07-29 DIAGNOSIS — C50211 Malignant neoplasm of upper-inner quadrant of right female breast: Secondary | ICD-10-CM

## 2019-07-29 DIAGNOSIS — Z170 Estrogen receptor positive status [ER+]: Secondary | ICD-10-CM | POA: Insufficient documentation

## 2019-07-29 LAB — CBC WITH DIFFERENTIAL (CANCER CENTER ONLY)
Abs Immature Granulocytes: 0 10*3/uL (ref 0.00–0.07)
Basophils Absolute: 0 10*3/uL (ref 0.0–0.1)
Basophils Relative: 1 %
Eosinophils Absolute: 0.1 10*3/uL (ref 0.0–0.5)
Eosinophils Relative: 2 %
HCT: 39.3 % (ref 36.0–46.0)
Hemoglobin: 12.6 g/dL (ref 12.0–15.0)
Immature Granulocytes: 0 %
Lymphocytes Relative: 33 %
Lymphs Abs: 1.1 10*3/uL (ref 0.7–4.0)
MCH: 28.3 pg (ref 26.0–34.0)
MCHC: 32.1 g/dL (ref 30.0–36.0)
MCV: 88.3 fL (ref 80.0–100.0)
Monocytes Absolute: 0.4 10*3/uL (ref 0.1–1.0)
Monocytes Relative: 11 %
Neutro Abs: 1.8 10*3/uL (ref 1.7–7.7)
Neutrophils Relative %: 53 %
Platelet Count: 173 10*3/uL (ref 150–400)
RBC: 4.45 MIL/uL (ref 3.87–5.11)
RDW: 13.1 % (ref 11.5–15.5)
WBC Count: 3.3 10*3/uL — ABNORMAL LOW (ref 4.0–10.5)
nRBC: 0 % (ref 0.0–0.2)

## 2019-07-29 LAB — CMP (CANCER CENTER ONLY)
ALT: 14 U/L (ref 0–44)
AST: 15 U/L (ref 15–41)
Albumin: 3.8 g/dL (ref 3.5–5.0)
Alkaline Phosphatase: 60 U/L (ref 38–126)
Anion gap: 7 (ref 5–15)
BUN: 10 mg/dL (ref 6–20)
CO2: 29 mmol/L (ref 22–32)
Calcium: 9.2 mg/dL (ref 8.9–10.3)
Chloride: 108 mmol/L (ref 98–111)
Creatinine: 0.78 mg/dL (ref 0.44–1.00)
GFR, Est AFR Am: >60 mL/min (ref >60)
GFR, Estimated: >60 mL/min (ref >60)
Glucose, Bld: 84 mg/dL (ref 70–99)
Potassium: 3.9 mmol/L (ref 3.5–5.1)
Sodium: 144 mmol/L (ref 135–145)
Total Bilirubin: 0.3 mg/dL (ref 0.3–1.2)
Total Protein: 7.3 g/dL (ref 6.5–8.1)

## 2019-07-29 NOTE — Assessment & Plan Note (Addendum)
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%, PR 0%, HER-2 negative, Ki-67 20%; biopsy of the 4.5 cm mass came back as IDC grade 2-3, ER 50%, PR 5%, HER-2 -2+ ratio 1.6, copy #3.2, Ki-67 20%, T2N1 stage IIb  Recommendation: 1.Neoadjuvant chemotherapy with dose dense Adriamycinand Cytoxan x4 followed by Taxol weekly x11 05/09/2018-10/02/2018 2.11/20/2018:Rightmastectomy and axillary lymph node dissection 3.Since the final path was HER-2 positive she will get 1 year of Herceptinwhich will be completed in October 2021 4.Adjuvantradiation7/14/2020-02/14/2019 5.Followed by adjuvant antiestrogen therapypatient prefers tamoxifen starting 03/28/2019 ----------------------------------------------------------- 11/20/2018:Right mastectomy: 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  She will continue with herceptin every three weeks and Tamoxifen daily.    I placed orders for her to undergo repeat echocardiogram and also mammogram of her left breast.  She and I briefly discussed the possibility of reconstruction down the road.

## 2019-07-29 NOTE — Progress Notes (Signed)
[facility] Follow up:    Miranda Ayers, [place] [id number]   DIAGNOSIS: Cancer Staging Malignant neoplasm of upper-inner quadrant of right breast in female, estrogen receptor positive ([place]) Staging form: Breast, AJCC 8th Edition - Clinical: Stage IIIA (cT2, cN1, cM0, G3, ER+, PR-, HER2-) - Unsigned - Pathologic stage from 11/20/2018: Stage IA (pT1c, pN0, cM0, G2, ER+, PR+, HER2+) - Signed by [name], NP on 12/05/2018   SUMMARY OF ONCOLOGIC HISTORY: Oncology History  Malignant neoplasm of upper-inner quadrant of right breast in female, estrogen receptor positive ([place])  04/17/2018 Initial Diagnosis   Palpable right breast masses UOQ and UIQ with multiple masses 12 to 14 cm, at 1:00 1.6 cm, 1231.8 cm, and o'clock 4.5 cm and at 9:30 position 5 cm: Biopsy revealed IDC with DCIS, grade 3, ER 80%, PR 0%, HER-2 negative, Ki-67 20%; biopsy of the 4.5 cm mass came back as IDC grade 2-3, ER 50%, PR 5%, HER-2 -2+ ratio 1.6, copy #3.2, Ki-67 20%, T2N1 stage IIb   05/09/2018 - 10/08/2018 Neo-Adjuvant Chemotherapy   Neoadjuvant chemotherapy with dose dense Adriamycin and Cytoxan x4 followed by Taxol weekly x12   06/19/2018 Genetic Testing   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  The Multi-Gene Panel offered by [facility] includes sequencing and/or deletion duplication testing of the following 84 genes: AIP, ALK, APC, ATM, AXIN2,BAP1,  BARD1, BLM, BMPR1A, BRCA1, BRCA2, BRIP1, CASR, CDC73, CDH1, CDK4, CDKN1B, CDKN1C, CDKN2A (p14ARF), CDKN2A (p16INK4a), CEBPA, CHEK2, CTNNA1, DICER1, DIS3L2, EGFR (c.2369C>T, p.Thr790Met variant only), EPCAM (Deletion/duplication testing only), FH, FLCN, GATA2, GPC3, GREM1 (Promoter region deletion/duplication testing only), HOXB13 (c.251G>A, p.Gly84Glu), HRAS, KIT, MAX, MEN1, MET, MITF (c.952G>A, p.Glu318Lys variant only), MLH1, MSH2, MSH3, MSH6, MUTYH, NBN, NF1, NF2,  NTHL1, PALB2, PDGFRA, PHOX2B, PMS2, POLD1, POLE, POT1, PRKAR1A, PTCH1, PTEN, RAD50, RAD51C, RAD51D, RB1, RECQL4, RET, RUNX1, SDHAF2, SDHA (sequence changes only), SDHB, SDHC, SDHD, SMAD4, SMARCA4, SMARCB1, SMARCE1, STK11, SUFU, TERC, TERT, TMEM127, TP53, TSC1, TSC2, VHL, WRN and WT1.  The report date is 06/19/2018.   10/11/2018 Breast MRI   Significant decrease in the size of the right axillary adenopathy and right breast masses from 1.2cm to 0.9cm with minimal residual enhancement, and 2.4x1.4cm to 2.3x1.9cm.   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, largest 1.8 cm, intermediate grade DCIS, margins negative, lymphovascular space invasion present, 0/6 lymph nodes negative, ER 60%, PR 60%, HER-2 positive ratio 2.59   11/20/2018 Cancer Staging   Staging form: Breast, AJCC 8th Edition - Pathologic stage from 11/20/2018: Stage IA (pT1c, pN0, cM0, G2, ER+, PR+, HER2+) - Signed by [name], NP on 12/05/2018   12/14/2018 -  Chemotherapy   The patient had trastuzumab (HERCEPTIN) 546 mg in sodium chloride 0.9 % 250 mL chemo infusion, 8 mg/kg = 546 mg, Intravenous,  Once, 3 of 3 cycles Administration: 546 mg (12/14/2018), 399 mg (01/03/2019), 399 mg (01/24/2019) trastuzumab-dkst (OGIVRI) 399 mg in sodium chloride 0.9 % 250 mL chemo infusion, 6 mg/kg = 399 mg (100 % of original dose 6 mg/kg), Intravenous,  Once, 8 of 14 cycles Dose modification: 6 mg/kg (original dose 6 mg/kg, Cycle 4, Reason: Other (see comments), Comment: Biosimilar Conversion) Administration: 399 mg (02/14/2019), 399 mg (03/07/2019), 399 mg (03/28/2019), 399 mg (04/18/2019), 399 mg (05/09/2019), 399 mg (05/30/2019), 399 mg (06/20/2019), 399 mg (07/11/2019)  for chemotherapy treatment.    01/01/2019 - 02/14/2019  Radiation Therapy   Adjuvant XRT to right chest wall     CURRENT THERAPY: Herceptin/Tamoxifen  INTERVAL HISTORY: Miranda Ayers 34 y.o. female returns for evaluation  prior to receiving Herceptin therapy which is due on Thursday, 08/01/2019.  [name] is doing well today.  She is working, and is doing well with this.  She has no new issues with Herceptin.  She continues on Tamoxifen with good tolerance.  She has some occasional pelvic cramping, but has not had her menses return yet.    Patient Active Problem List   Diagnosis Date Noted  . Neutropenia, drug-induced ([place]) 08/20/2018  . Genetic testing 06/22/2018  . Family history of lung cancer   . Family history of colon cancer   . Family history of pancreatic cancer   . Malignant neoplasm of upper-inner quadrant of right breast in female, estrogen receptor positive ([place]) 04/19/2018    has No Known Allergies.  MEDICAL HISTORY: Past Medical History:  Diagnosis Date  . Cancer [facility])    breast cancer  . Family history of colon cancer   . Family history of lung cancer   . Family history of pancreatic cancer   . Hypertension     SURGICAL HISTORY: Past Surgical History:  Procedure Laterality Date  . MASTECTOMY WITH RADIOACTIVE SEED GUIDED EXCISION AND AXILLARY SENTINEL LYMPH NODE BIOPSY Right 11/20/2018   Procedure: RIGHT SIMPLE MASTECTOMY WITH RIGHT RADIOACTIVE SEED TARGETED LYMPH NODE BIOPSY AND RIGHT  AXILLARY SENTINEL LYMPH NODE MAPPING AND PORT REMOVEL RIGHT CHEST;  Surgeon: [name], MD;  Location: [place];  Service: General;  Laterality: Right;  . OOPHORECTOMY  2013  . PORTACATH PLACEMENT N/A 05/08/2018   Procedure: INSERTION PORT-A-CATH WITH ULTRASOUND;  Surgeon: [name], MD;  Location: [place];  Service: General;  Laterality: N/A;    SOCIAL HISTORY: Social History   Socioeconomic History  . Marital status: Single    Spouse name: Not on file  . Number of children: Not on file  . Years of education: Not on file  . Highest education level: Not on file  Occupational History  . Not on file  Tobacco Use  . Smoking status: Never Smoker  .  Smokeless tobacco: Never Used  Substance and Sexual Activity  . Alcohol use: Never  . Drug use: Never  . Sexual activity: Not Currently    Birth control/protection: None  Other Topics Concern  . Not on file  Social History Narrative  . Not on file   Social Determinants of Health   Financial Resource Strain:   . Difficulty of Paying Living Expenses: Not on file  Food Insecurity:   . Worried About Charity fundraiser in the Last Year: Not on file  . Ran Out of Food in the Last Year: Not on file  Transportation Needs:   . Lack of Transportation (Medical): Not on file  . Lack of Transportation (Non-Medical): Not on file  Physical Activity:   . Days of Exercise per Week: Not on file  . Minutes of Exercise per Session: Not on file  Stress:   . Feeling of Stress : Not on file  Social Connections:   . Frequency of Communication with Friends and Family: Not on file  . Frequency of Social Gatherings with Friends and Family: Not on file  . Attends Religious Services: Not on file  . Active Member of Clubs or Organizations: Not on file  . Attends Archivist Meetings: Not on file  .  Marital Status: Not on file  Intimate Partner Violence:   . Fear of Current or Ex-Partner: Not on file  . Emotionally Abused: Not on file  . Physically Abused: Not on file  . Sexually Abused: Not on file    FAMILY HISTORY: Family History  Problem Relation Age of Onset  . Lung cancer Maternal Grandmother   . Lung cancer Maternal Grandfather   . Colon cancer Maternal Grandfather   . Stomach cancer Paternal Grandfather   . Lung cancer Maternal Aunt   . Pancreatic cancer Maternal Aunt   . Lung cancer Maternal Uncle   . Cancer Cousin        childhood cancer in mat first cousin    Review of Systems  Constitutional: Negative for appetite change, chills, fatigue, fever and unexpected weight change.  HENT:   Negative for hearing loss, lump/mass and trouble swallowing.   Eyes: Negative for eye  problems and icterus.  Respiratory: Negative for chest tightness, cough and shortness of breath.   Cardiovascular: Negative for chest pain, leg swelling and palpitations.  Gastrointestinal: Negative for abdominal distention, abdominal pain, constipation, nausea and vomiting.  Endocrine: Negative for hot flashes.  Genitourinary: Negative for difficulty urinating.   Musculoskeletal: Negative for arthralgias.  Skin: Negative for itching and rash.  Neurological: Negative for dizziness.  Hematological: Negative for adenopathy. Does not bruise/bleed easily.  Psychiatric/Behavioral: Negative for depression. The patient is not nervous/anxious.       PHYSICAL EXAMINATION  ECOG PERFORMANCE STATUS: 1 - Symptomatic but completely ambulatory  Vitals:   07/29/19 1134  BP: (!) 147/94  Pulse: 93  Resp: 17  Temp: 98.2 F (36.8 C)  SpO2: 100%    Physical Exam Constitutional:      General: She is not in acute distress.    Appearance: Normal appearance. She is not toxic-appearing.  HENT:     Head: Normocephalic and atraumatic.  Eyes:     General: No scleral icterus. Cardiovascular:     Rate and Rhythm: Normal rate and regular rhythm.     Pulses: Normal pulses.     Heart sounds: Normal heart sounds.  Pulmonary:     Effort: Pulmonary effort is normal.     Breath sounds: Normal breath sounds.     Comments: Right breast s/p mastectomy and radiation, no sign of local recurrence, left breast is benign Abdominal:     General: Abdomen is flat. There is no distension.     Palpations: Abdomen is soft.     Tenderness: There is no abdominal tenderness.  Musculoskeletal:        General: No swelling.     Cervical back: Neck supple.  Lymphadenopathy:     Cervical: No cervical adenopathy.  Skin:    General: Skin is warm and dry.     Capillary Refill: Capillary refill takes less than 2 seconds.     Findings: No rash.  Neurological:     General: No focal deficit present.     Mental Status: She  is alert.  Psychiatric:        Mood and Affect: Mood normal.        Behavior: Behavior normal.     LABORATORY DATA:  CBC    Component Value Date/Time   WBC 3.3 (L) 07/29/2019 1122   WBC 2.7 (L) 11/16/2018 1507   RBC 4.45 07/29/2019 1122   HGB 12.6 07/29/2019 1122   HCT 39.3 07/29/2019 1122   PLT 173 07/29/2019 1122   MCV 88.3 07/29/2019 1122  MCH 28.3 07/29/2019 1122   MCHC 32.1 07/29/2019 1122   RDW 13.1 07/29/2019 1122   LYMPHSABS 1.1 07/29/2019 1122   MONOABS 0.4 07/29/2019 1122   EOSABS 0.1 07/29/2019 1122   BASOSABS 0.0 07/29/2019 1122    CMP     Component Value Date/Time   NA 142 03/28/2019 0833   K 3.3 (L) 03/28/2019 0833   CL 108 03/28/2019 0833   CO2 25 03/28/2019 0833   GLUCOSE 94 03/28/2019 0833   BUN 12 03/28/2019 0833   CREATININE 0.76 03/28/2019 0833   CALCIUM 9.4 03/28/2019 0833   PROT 7.5 03/28/2019 0833   ALBUMIN 4.0 03/28/2019 0833   AST 19 03/28/2019 0833   ALT 19 03/28/2019 0833   ALKPHOS 77 03/28/2019 0833   BILITOT 0.3 03/28/2019 0833   GFRNONAA >60 03/28/2019 0833   GFRAA >60 03/28/2019 0833       ASSESSMENT and THERAPY PLAN:   Malignant neoplasm of upper-inner quadrant of right breast in female, estrogen receptor positive ([place]) 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%, PR 0%, HER-2 negative, Ki-67 20%; biopsy of the 4.5 cm mass came back as IDC grade 2-3, ER 50%, PR 5%, HER-2 -2+ ratio 1.6, copy #3.2, Ki-67 20%, T2N1 stage IIb  Recommendation: 1.Neoadjuvant chemotherapy with dose dense Adriamycinand Cytoxan x4 followed by Taxol weekly x11 05/09/2018-10/02/2018 2.11/20/2018:Rightmastectomy and axillary lymph node dissection 3.Since the final path was HER-2 positive she will get 1 year of Herceptinwhich will be completed in October  2021 4.Adjuvantradiation7/14/2020-02/14/2019 5.Followed by adjuvant antiestrogen therapypatient prefers tamoxifen starting 03/28/2019 ----------------------------------------------------------- 11/20/2018:Right mastectomy: 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  She will continue with herceptin every three weeks and Tamoxifen daily.    I placed orders for her to undergo repeat echocardiogram and also mammogram of her left breast.  She and I briefly discussed the possibility of reconstruction down the road.     Orders Placed This Encounter  Procedures  . ECHOCARDIOGRAM COMPLETE    Standing Status:   Future    Standing Expiration Date:   10/25/2020    Order Specific Question:   Where should this test be performed    Answer:   [place]    Order Specific Question:   Perflutren DEFINITY (image enhancing agent) should be administered unless hypersensitivity or allergy exist    Answer:   Administer Perflutren    Order Specific Question:   Reason for exam-Echo    Answer:   Chemotherapy evaluation  v87.41 / v58.11    All questions were answered. The patient knows to call the clinic with any problems, questions or concerns. We can certainly see the patient much sooner if necessary.  Total time this encounter: 30 minutes.   This note was electronically signed. Scot Dock, NP 07/29/2019

## 2019-08-01 ENCOUNTER — Other Ambulatory Visit: Payer: Self-pay

## 2019-08-01 ENCOUNTER — Ambulatory Visit: Payer: BC Managed Care – PPO | Admitting: Hematology and Oncology

## 2019-08-01 ENCOUNTER — Inpatient Hospital Stay: Payer: BC Managed Care – PPO

## 2019-08-01 ENCOUNTER — Other Ambulatory Visit: Payer: BC Managed Care – PPO

## 2019-08-01 VITALS — BP 152/88 | HR 78 | Temp 98.0°F | Resp 18 | Ht 61.0 in | Wt 160.8 lb

## 2019-08-01 DIAGNOSIS — Z5112 Encounter for antineoplastic immunotherapy: Secondary | ICD-10-CM | POA: Diagnosis not present

## 2019-08-01 DIAGNOSIS — C50211 Malignant neoplasm of upper-inner quadrant of right female breast: Secondary | ICD-10-CM

## 2019-08-01 MED ORDER — TRASTUZUMAB-DKST CHEMO 150 MG IV SOLR
6.0000 mg/kg | Freq: Once | INTRAVENOUS | Status: AC
  Administered 2019-08-01: 399 mg via INTRAVENOUS
  Filled 2019-08-01: qty 19

## 2019-08-01 MED ORDER — ACETAMINOPHEN 325 MG PO TABS
ORAL_TABLET | ORAL | Status: AC
  Filled 2019-08-01: qty 2

## 2019-08-01 MED ORDER — SODIUM CHLORIDE 0.9 % IV SOLN
Freq: Once | INTRAVENOUS | Status: AC
  Filled 2019-08-01: qty 250

## 2019-08-01 MED ORDER — DIPHENHYDRAMINE HCL 25 MG PO CAPS
50.0000 mg | ORAL_CAPSULE | Freq: Once | ORAL | Status: AC
  Administered 2019-08-01: 50 mg via ORAL

## 2019-08-01 MED ORDER — DIPHENHYDRAMINE HCL 25 MG PO CAPS
ORAL_CAPSULE | ORAL | Status: AC
  Filled 2019-08-01: qty 2

## 2019-08-01 MED ORDER — ACETAMINOPHEN 325 MG PO TABS
650.0000 mg | ORAL_TABLET | Freq: Once | ORAL | Status: AC
  Administered 2019-08-01: 10:00:00 650 mg via ORAL

## 2019-08-01 NOTE — Patient Instructions (Signed)
Trastuzumab injection for infusion What is this medicine? TRASTUZUMAB (tras TOO zoo mab) is a monoclonal antibody. It is used to treat breast cancer and stomach cancer. This medicine may be used for other purposes; ask your health care provider or pharmacist if you have questions. COMMON BRAND NAME(S): Herceptin, Herzuma, KANJINTI, Ogivri, Ontruzant, Trazimera What should I tell my health care provider before I take this medicine? They need to know if you have any of these conditions:  heart disease  heart failure  lung or breathing disease, like asthma  an unusual or allergic reaction to trastuzumab, benzyl alcohol, or other medications, foods, dyes, or preservatives  pregnant or trying to get pregnant  breast-feeding How should I use this medicine? This drug is given as an infusion into a vein. It is administered in a hospital or clinic by a specially trained health care professional. Talk to your pediatrician regarding the use of this medicine in children. This medicine is not approved for use in children. Overdosage: If you think you have taken too much of this medicine contact a poison control center or emergency room at once. NOTE: This medicine is only for you. Do not share this medicine with others. What if I miss a dose? It is important not to miss a dose. Call your doctor or health care professional if you are unable to keep an appointment. What may interact with this medicine? This medicine may interact with the following medications:  certain types of chemotherapy, such as daunorubicin, doxorubicin, epirubicin, and idarubicin This list may not describe all possible interactions. Give your health care provider a list of all the medicines, herbs, non-prescription drugs, or dietary supplements you use. Also tell them if you smoke, drink alcohol, or use illegal drugs. Some items may interact with your medicine. What should I watch for while using this medicine? Visit your  doctor for checks on your progress. Report any side effects. Continue your course of treatment even though you feel ill unless your doctor tells you to stop. Call your doctor or health care professional for advice if you get a fever, chills or sore throat, or other symptoms of a cold or flu. Do not treat yourself. Try to avoid being around people who are sick. You may experience fever, chills and shaking during your first infusion. These effects are usually mild and can be treated with other medicines. Report any side effects during the infusion to your health care professional. Fever and chills usually do not happen with later infusions. Do not become pregnant while taking this medicine or for 7 months after stopping it. Women should inform their doctor if they wish to become pregnant or think they might be pregnant. Women of child-bearing potential will need to have a negative pregnancy test before starting this medicine. There is a potential for serious side effects to an unborn child. Talk to your health care professional or pharmacist for more information. Do not breast-feed an infant while taking this medicine or for 7 months after stopping it. Women must use effective birth control with this medicine. What side effects may I notice from receiving this medicine? Side effects that you should report to your doctor or health care professional as soon as possible:  allergic reactions like skin rash, itching or hives, swelling of the face, lips, or tongue  chest pain or palpitations  cough  dizziness  feeling faint or lightheaded, falls  fever  general ill feeling or flu-like symptoms  signs of worsening heart failure like   breathing problems; swelling in your legs and feet  unusually weak or tired Side effects that usually do not require medical attention (report to your doctor or health care professional if they continue or are bothersome):  bone pain  changes in  taste  diarrhea  joint pain  nausea/vomiting  weight loss This list may not describe all possible side effects. Call your doctor for medical advice about side effects. You may report side effects to FDA at 1-800-FDA-1088. Where should I keep my medicine? This drug is given in a hospital or clinic and will not be stored at home. NOTE: This sheet is a summary. It may not cover all possible information. If you have questions about this medicine, talk to your doctor, pharmacist, or health care provider.  2020 Elsevier/Gold Standard (2016-05-31 14:37:52)  Coronavirus (COVID-19) Are you at risk?  Are you at risk for the Coronavirus (COVID-19)?  To be considered HIGH RISK for Coronavirus (COVID-19), you have to meet the following criteria:  . Traveled to [place], [place], [place], [place] or [place]; or in the [place] to [place], [place], [place], or [place]; and have fever, cough, and shortness of breath within the last 2 weeks of travel OR . Been in close contact with a person diagnosed with COVID-19 within the last 2 weeks and have fever, cough, and shortness of breath . IF YOU DO NOT MEET THESE CRITERIA, YOU ARE CONSIDERED LOW RISK FOR COVID-19.  What to do if you are HIGH RISK for COVID-19?  . If you are having a medical emergency, call 911. . Seek medical care right away. Before you go to a doctor's office, urgent care or emergency department, call ahead and tell them about your recent travel, contact with someone diagnosed with COVID-19, and your symptoms. You should receive instructions from your physician's office regarding next steps of care.  . When you arrive at healthcare provider, tell the healthcare staff immediately you have returned from visiting [place], [place], [place], [place] or [place]; or traveled in the [place] to [place], [place], [place], or [place]; in the last two weeks or you have been in close contact with a person diagnosed with COVID-19  in the last 2 weeks.   . Tell the health care staff about your symptoms: fever, cough and shortness of breath. . After you have been seen by a medical provider, you will be either: o Tested for (COVID-19) and discharged home on quarantine except to seek medical care if symptoms worsen, and asked to  - Stay home and avoid contact with others until you get your results (4-5 days)  - Avoid travel on public transportation if possible (such as bus, train, or airplane) or o Sent to the Emergency Department by EMS for evaluation, COVID-19 testing, and possible admission depending on your condition and test results.  What to do if you are LOW RISK for COVID-19?  Reduce your risk of any infection by using the same precautions used for avoiding the common cold or flu:  . Wash your hands often with soap and warm water for at least 20 seconds.  If soap and water are not readily available, use an alcohol-based hand sanitizer with at least 60% alcohol.  . If coughing or sneezing, cover your mouth and nose by coughing or sneezing into the elbow areas of your shirt or coat, into a tissue or into your sleeve (not your hands). . Avoid shaking hands with others and consider head nods   or verbal greetings only. . Avoid touching your eyes, nose, or mouth with unwashed hands.  . Avoid close contact with people who are sick. . Avoid places or events with large numbers of people in one location, like concerts or sporting events. . Carefully consider travel plans you have or are making. . If you are planning any travel outside or inside the [place], visit the CDC's Travelers' Health webpage for the latest health notices. . If you have some symptoms but not all symptoms, continue to monitor at home and seek medical attention if your symptoms worsen. . If you are having a medical emergency, call 911.   ADDITIONAL HEALTHCARE OPTIONS FOR PATIENTS  [facility] / e-Visit:  [email or web address]         [facility]: [phone]  [facility]: [phone]                   [facility]: [phone]   

## 2019-08-05 ENCOUNTER — Ambulatory Visit (HOSPITAL_COMMUNITY)
Admission: RE | Admit: 2019-08-05 | Discharge: 2019-08-05 | Disposition: A | Discharge status: Home / Self Care | Payer: BC Managed Care – PPO | Source: Ambulatory Visit | Attending: Adult Health | Admitting: Adult Health

## 2019-08-05 ENCOUNTER — Other Ambulatory Visit: Payer: Self-pay

## 2019-08-05 DIAGNOSIS — C50211 Malignant neoplasm of upper-inner quadrant of right female breast: Secondary | ICD-10-CM

## 2019-08-05 DIAGNOSIS — Z170 Estrogen receptor positive status [ER+]: Secondary | ICD-10-CM | POA: Diagnosis not present

## 2019-08-05 DIAGNOSIS — Z01818 Encounter for other preprocedural examination: Secondary | ICD-10-CM | POA: Insufficient documentation

## 2019-08-05 DIAGNOSIS — I10 Essential (primary) hypertension: Secondary | ICD-10-CM | POA: Insufficient documentation

## 2019-08-05 DIAGNOSIS — Z0189 Encounter for other specified special examinations: Secondary | ICD-10-CM

## 2019-08-05 NOTE — Progress Notes (Signed)
Echocardiogram 2D Echocardiogram has been performed.  Oneal Deputy Reginna Sermeno 08/05/2019, 11:36 AM

## 2019-08-19 ENCOUNTER — Telehealth (HOSPITAL_COMMUNITY): Payer: Self-pay

## 2019-08-19 NOTE — Telephone Encounter (Signed)

## 2019-08-20 ENCOUNTER — Telehealth: Payer: Self-pay | Admitting: *Deleted

## 2019-08-20 ENCOUNTER — Ambulatory Visit (HOSPITAL_COMMUNITY)
Admission: RE | Admit: 2019-08-20 | Discharge: 2019-08-20 | Disposition: A | Discharge status: Home / Self Care | Payer: BC Managed Care – PPO | Source: Ambulatory Visit | Attending: Internal Medicine | Admitting: Internal Medicine

## 2019-08-20 ENCOUNTER — Telehealth: Payer: Self-pay | Admitting: Hematology and Oncology

## 2019-08-20 ENCOUNTER — Other Ambulatory Visit: Payer: Self-pay

## 2019-08-20 VITALS — BP 142/86 | HR 73 | Wt 157.4 lb

## 2019-08-20 DIAGNOSIS — Z170 Estrogen receptor positive status [ER+]: Secondary | ICD-10-CM | POA: Diagnosis not present

## 2019-08-20 DIAGNOSIS — E669 Obesity, unspecified: Secondary | ICD-10-CM | POA: Diagnosis not present

## 2019-08-20 DIAGNOSIS — Z6829 Body mass index (BMI) 29.0-29.9, adult: Secondary | ICD-10-CM | POA: Diagnosis not present

## 2019-08-20 DIAGNOSIS — Z923 Personal history of irradiation: Secondary | ICD-10-CM | POA: Insufficient documentation

## 2019-08-20 DIAGNOSIS — C50211 Malignant neoplasm of upper-inner quadrant of right female breast: Secondary | ICD-10-CM | POA: Diagnosis not present

## 2019-08-20 DIAGNOSIS — I10 Essential (primary) hypertension: Secondary | ICD-10-CM | POA: Diagnosis not present

## 2019-08-20 DIAGNOSIS — Z800 Family history of malignant neoplasm of digestive organs: Secondary | ICD-10-CM | POA: Insufficient documentation

## 2019-08-20 DIAGNOSIS — I427 Cardiomyopathy due to drug and external agent: Secondary | ICD-10-CM | POA: Insufficient documentation

## 2019-08-20 DIAGNOSIS — Z79810 Long term (current) use of selective estrogen receptor modulators (SERMs): Secondary | ICD-10-CM | POA: Diagnosis not present

## 2019-08-20 DIAGNOSIS — Z9011 Acquired absence of right breast and nipple: Secondary | ICD-10-CM | POA: Insufficient documentation

## 2019-08-20 DIAGNOSIS — Z79899 Other long term (current) drug therapy: Secondary | ICD-10-CM | POA: Insufficient documentation

## 2019-08-20 DIAGNOSIS — T451X5A Adverse effect of antineoplastic and immunosuppressive drugs, initial encounter: Secondary | ICD-10-CM | POA: Insufficient documentation

## 2019-08-20 DIAGNOSIS — Z801 Family history of malignant neoplasm of trachea, bronchus and lung: Secondary | ICD-10-CM | POA: Diagnosis not present

## 2019-08-20 DIAGNOSIS — Z9221 Personal history of antineoplastic chemotherapy: Secondary | ICD-10-CM | POA: Insufficient documentation

## 2019-08-20 MED ORDER — CARVEDILOL 3.125 MG PO TABS: 3.1250 mg | ORAL_TABLET | Freq: Two times a day (BID) | ORAL | 3 refills | Status: DC

## 2019-08-20 MED ORDER — LOSARTAN POTASSIUM 50 MG PO TABS: 50.0000 mg | ORAL_TABLET | Freq: Every day | ORAL | 3 refills | Status: DC

## 2019-08-20 NOTE — Consult Note (Signed)
CARDIO-ONCOLOGY CLINIC CONSULT NOTE  Referring Physician: Dr. Lindi Adie    HPI:  Ms Karam is 34 y.o. female with h/o obesity, HTN and breast cancer referred by Dr. Lindi Adie for enrollment into the Cardio-Oncology program due to reduced EF.   SUMMARY OF ONCOLOGIC HISTORY:    Oncology History  Malignant neoplasm of upper-inner quadrant of right breast in female, estrogen receptor positive ([place])  04/17/2018 Initial Diagnosis   Palpable right breast masses UOQ and UIQ with multiple masses 12 to 14 cm, at 1:00 1.6 cm, 1231.8 cm, and o'clock 4.5 cm and at 9:30 position 5 cm: Biopsy revealed IDC with DCIS, grade 3, ER 80%, PR 0%, HER-2 negative, Ki-67 20%; biopsy of the 4.5 cm mass came back as IDC grade 2-3, ER 50%, PR 5%, HER-2 -2+ ratio 1.6, copy #3.2, Ki-67 20%, T2N1 stage IIb   05/09/2018 - 10/08/2018 Neo-Adjuvant Chemotherapy   Neoadjuvant chemotherapy with dose dense Adriamycinand Cytoxan x4 followed by Taxol weekly x12   06/19/2018 Genetic Testing   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  The Multi-Gene Panel offered by [facility] includes sequencing and/or deletion duplication testing of the following 84 genes: AIP, ALK, APC, ATM, AXIN2,BAP1,  BARD1, BLM, BMPR1A, BRCA1, BRCA2, BRIP1, CASR, CDC73, CDH1, CDK4, CDKN1B, CDKN1C, CDKN2A (p14ARF), CDKN2A (p16INK4a), CEBPA, CHEK2, CTNNA1, DICER1, DIS3L2, EGFR (c.2369C>T, p.Thr790Met variant only), EPCAM (Deletion/duplication testing only), FH, FLCN, GATA2, GPC3, GREM1 (Promoter region deletion/duplication testing only), HOXB13 (c.251G>A, p.Gly84Glu), HRAS, KIT, MAX, MEN1, MET, MITF (c.952G>A, p.Glu318Lys variant only), MLH1, MSH2, MSH3, MSH6, MUTYH, NBN, NF1, NF2, NTHL1, PALB2, PDGFRA, PHOX2B, PMS2, POLD1, POLE, POT1, PRKAR1A, PTCH1, PTEN, RAD50, RAD51C, RAD51D, RB1, RECQL4, RET, RUNX1, SDHAF2, SDHA (sequence changes only), SDHB, SDHC, SDHD, SMAD4, SMARCA4, SMARCB1, SMARCE1, STK11, SUFU, TERC, TERT, TMEM127,  TP53, TSC1, TSC2, VHL, WRN and WT1.  The report date is 06/19/2018.   10/11/2018 Breast MRI   Significant decrease in the size of the right axillary adenopathy and right breast masses from 1.2cm to 0.9cm with minimal residual enhancement, and 2.4x1.4cm to 2.3x1.9cm.   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, largest 1.8 cm, intermediate grade DCIS, margins negative, lymphovascular space invasion present, 0/6 lymph nodes negative, ER 60%, PR 60%, HER-2 positive ratio 2.59   11/20/2018 Cancer Staging   Staging form: Breast, AJCC 8th Edition - Pathologic stage from 11/20/2018: Stage IA (pT1c, pN0, cM0, G2, ER+, PR+, HER2+) - Signed by [name], NP on 12/05/2018   12/14/2018 -  Chemotherapy   The patient had trastuzumab (HERCEPTIN) 546 mg in sodium chloride 0.9 % 250 mL chemo infusion, 8 mg/kg = 546 mg, Intravenous,  Once, 3 of 3 cycles Administration: 546 mg (12/14/2018), 399 mg (01/03/2019), 399 mg (01/24/2019) trastuzumab-dkst (OGIVRI) 399 mg in sodium chloride 0.9 % 250 mL chemo infusion, 6 mg/kg = 399 mg (100 % of original dose 6 mg/kg), Intravenous,  Once, 8 of 14 cycles Dose modification: 6 mg/kg (original dose 6 mg/kg, Cycle 4, Reason: Other (see comments), Comment: Biosimilar Conversion) Administration: 399 mg (02/14/2019), 399 mg (03/07/2019), 399 mg (03/28/2019), 399 mg (04/18/2019), 399 mg (05/09/2019), 399 mg (05/30/2019), 399 mg (06/20/2019), 399 mg (07/11/2019)  for chemotherapy treatment.    01/01/2019 - 02/14/2019 Radiation Therapy   Adjuvant XRT to right chest wall     Denies any h/o known cardiac disease. Has suboptimally controlled HTN . Found to have R breast CA in 11/19. Treated with adriamycin based regimen.  Did fine. Post adriamycin echo was normal. Has mastectomy and found to have residual tumor in R breast which was HER-2+ . Started on Herceptin in 6/20. Echo 10/20 EF 55-60%   Has continued on herceptin  with no side effects. Echo 08/05/19 showwed EF 40-45%. No SOB, orthopnea or edema.  Echo  11/19  55-60% GLS -21.7%  - adriamycin completed 4/20  - herceptin started 12/14/18   Echo  6/20 55-60% GLS -14.5% Echo 10/20 55-60% GLS -18 Echo 08/05/19 EF 40-45% GLS -15  Review of Systems: [y] = yes, [ ]  = no   General: Weight gain [ ] ; Weight loss [ ] ; Anorexia [ ] ; Fatigue [ ] ; Fever [ ] ; Chills [ ] ; Weakness [ ]   Cardiac: Chest pain/pressure [ ] ; Resting SOB [ ] ; Exertional SOB [ ] ; Orthopnea [ ] ; Pedal Edema [ ] ; Palpitations [ ] ; Syncope [ ] ; Presyncope [ ] ; Paroxysmal nocturnal dyspnea[ ]   Pulmonary: Cough [ ] ; Wheezing[ ] ; Hemoptysis[ ] ; Sputum [ ] ; Snoring [ ]   GI: Vomiting[ ] ; Dysphagia[ ] ; Melena[ ] ; Hematochezia [ ] ; Heartburn[ ] ; Abdominal pain [ ] ; Constipation [ ] ; Diarrhea [ ] ; BRBPR [ ]   GU: Hematuria[ ] ; Dysuria [ ] ; Nocturia[ ]   Vascular: Pain in legs with walking [ ] ; Pain in feet with lying flat [ ] ; Non-healing sores [ ] ; Stroke [ ] ; TIA [ ] ; Slurred speech [ ] ;  Neuro: Headaches[ ] ; Vertigo[ ] ; Seizures[ ] ; Paresthesias[ ] ;Blurred vision [ ] ; Diplopia [ ] ; Vision changes [ ]   Ortho/Skin: Arthritis [ ] ; Joint pain [ ] ; Muscle pain [ ] ; Joint swelling [ ] ; Back Pain [ ] ; Rash [ ]   Psych: Depression[ ] ; Anxiety[ ]   Heme: Bleeding problems [ ] ; Clotting disorders [ ] ; Anemia [ ]   Endocrine: Diabetes [ ] ; Thyroid dysfunction[ ]    Past Medical History:  Diagnosis Date  . Cancer [facility])    breast cancer  . Family history of colon cancer   . Family history of lung cancer   . Family history of pancreatic cancer   . Hypertension     Current Outpatient Medications  Medication Sig Dispense Refill  . fluocinolone (SYNALAR) 0.025 % cream Apply topically 3 (three) times daily. 120 g 2  . hydrochlorothiazide (HYDRODIURIL) 12.5 MG tablet Take 12.5 mg by mouth daily.    . tamoxifen (NOLVADEX) 20 MG tablet Take 1 tablet (20 mg total) by mouth daily. 90 tablet 3   No current  facility-administered medications for this encounter.    No Known Allergies    Social History   Socioeconomic History  . Marital status: Single    Spouse name: Not on file  . Number of children: Not on file  . Years of education: Not on file  . Highest education level: Not on file  Occupational History  . Not on file  Tobacco Use  . Smoking status: Never Smoker  . Smokeless tobacco: Never Used  Substance and Sexual Activity  . Alcohol use: Never  . Drug use: Never  . Sexual activity: Not Currently    Birth control/protection: None  Other Topics Concern  . Not on file  Social History Narrative  . Not on file   Social Determinants of Health   Financial Resource Strain:   . Difficulty of Paying Living Expenses: Not on file  Food Insecurity:   . Worried About Charity fundraiser in the Last Year: Not on file  . Ran Out of Food in the Last Year: Not  on file  Transportation Needs:   . Lack of Transportation (Medical): Not on file  . Lack of Transportation (Non-Medical): Not on file  Physical Activity:   . Days of Exercise per Week: Not on file  . Minutes of Exercise per Session: Not on file  Stress:   . Feeling of Stress : Not on file  Social Connections:   . Frequency of Communication with Friends and Family: Not on file  . Frequency of Social Gatherings with Friends and Family: Not on file  . Attends Religious Services: Not on file  . Active Member of Clubs or Organizations: Not on file  . Attends Archivist Meetings: Not on file  . Marital Status: Not on file  Intimate Partner Violence:   . Fear of Current or Ex-Partner: Not on file  . Emotionally Abused: Not on file  . Physically Abused: Not on file  . Sexually Abused: Not on file      Family History  Problem Relation Age of Onset  . Lung cancer Maternal Grandmother   . Lung cancer Maternal Grandfather   . Colon cancer Maternal Grandfather   . Stomach cancer Paternal Grandfather   . Lung cancer  Maternal Aunt   . Pancreatic cancer Maternal Aunt   . Lung cancer Maternal Uncle   . Cancer Cousin        childhood cancer in mat first cousin    Vitals:   08/20/19 1050  BP: (!) 142/86  Pulse: 73  SpO2: 98%  Weight: 71.4 kg (157 lb 6 oz)    PHYSICAL EXAM: General:  Well appearing. No respiratory difficulty HEENT: normal Neck: supple. no JVD. Carotids 2+ bilat; no bruits. No lymphadenopathy or thryomegaly appreciated. Cor: PMI nondisplaced. Regular rate & rhythm. No rubs, gallops or murmurs. Lungs: clear Abdomen: obese soft, nontender, nondistended. No hepatosplenomegaly. No bruits or masses. Good bowel sounds. Extremities: no cyanosis, clubbing, rash, edema Neuro: alert & oriented x 3, cranial nerves grossly intact. moves all 4 extremities w/o difficulty. Affect pleasant.  ECG: NSR 73 No ST-T wave abnormalities. 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 Uncontrolled HTN may also be playing a role - will hold Herceptin  - start losartan 50 and carvedilol 3.125 bid - repeat visit and echo in 6 months - Explained incidence of Herceptin/Adraimycin cardiotoxicity and role of Cardio-oncology clinic at length. Echo images reviewed personally. Hopefully EF will improve with holding Herceptin and medical rx.   2. HTN - uncontrolled - adding losartan and carvedilol  - denies h/o snoring  3. Breast Cancer - as above  Glori Bickers, MD  12:02 PM

## 2019-08-20 NOTE — Telephone Encounter (Signed)
Per MD pt tx to be held for 6-8 weeks due to decrease in EF.  Pt following up with Dr. Haroldine Laws in 6 weeks for repeat echo.  Scheduling message sent to reschedule upcoming infusion apts and pt notified and verbalize understanding on why apts will be push out.

## 2019-08-20 NOTE — Telephone Encounter (Signed)
Scheduled appt per 3/2 sch message - pt aware of new appts d/t

## 2019-08-20 NOTE — Patient Instructions (Signed)
START Losartan 50mg  daily at bedtime.  START Carvedilol 3.125mg  twice daily  Follow up and echo in 6 weeks

## 2019-08-22 ENCOUNTER — Ambulatory Visit: Payer: BC Managed Care – PPO

## 2019-09-10 ENCOUNTER — Encounter: Payer: Self-pay | Admitting: *Deleted

## 2019-09-12 ENCOUNTER — Other Ambulatory Visit: Payer: BC Managed Care – PPO

## 2019-09-12 ENCOUNTER — Ambulatory Visit: Payer: BC Managed Care – PPO

## 2019-09-12 ENCOUNTER — Ambulatory Visit: Payer: BC Managed Care – PPO | Admitting: Hematology and Oncology

## 2019-10-03 ENCOUNTER — Ambulatory Visit: Payer: BC Managed Care – PPO

## 2019-10-04 ENCOUNTER — Encounter (HOSPITAL_COMMUNITY): Payer: Self-pay

## 2019-10-11 ENCOUNTER — Encounter (HOSPITAL_COMMUNITY): Payer: Self-pay | Admitting: Internal Medicine

## 2019-10-11 ENCOUNTER — Other Ambulatory Visit: Payer: Self-pay

## 2019-10-11 ENCOUNTER — Ambulatory Visit (HOSPITAL_BASED_OUTPATIENT_CLINIC_OR_DEPARTMENT_OTHER)
Admission: RE | Admit: 2019-10-11 | Discharge: 2019-10-11 | Disposition: A | Discharge status: Home / Self Care | Payer: BC Managed Care – PPO | Source: Ambulatory Visit | Attending: Internal Medicine | Admitting: Internal Medicine

## 2019-10-11 ENCOUNTER — Ambulatory Visit (HOSPITAL_COMMUNITY)
Admission: RE | Admit: 2019-10-11 | Discharge: 2019-10-11 | Disposition: A | Discharge status: Home / Self Care | Payer: BC Managed Care – PPO | Source: Ambulatory Visit | Attending: Internal Medicine | Admitting: Internal Medicine

## 2019-10-11 VITALS — BP 132/98 | HR 68 | Wt 164.6 lb

## 2019-10-11 DIAGNOSIS — T451X5A Adverse effect of antineoplastic and immunosuppressive drugs, initial encounter: Secondary | ICD-10-CM | POA: Diagnosis not present

## 2019-10-11 DIAGNOSIS — I427 Cardiomyopathy due to drug and external agent: Secondary | ICD-10-CM

## 2019-10-11 DIAGNOSIS — Z79899 Other long term (current) drug therapy: Secondary | ICD-10-CM | POA: Diagnosis not present

## 2019-10-11 DIAGNOSIS — Z0189 Encounter for other specified special examinations: Secondary | ICD-10-CM

## 2019-10-11 DIAGNOSIS — Z170 Estrogen receptor positive status [ER+]: Secondary | ICD-10-CM

## 2019-10-11 DIAGNOSIS — R002 Palpitations: Secondary | ICD-10-CM

## 2019-10-11 DIAGNOSIS — C50211 Malignant neoplasm of upper-inner quadrant of right female breast: Secondary | ICD-10-CM

## 2019-10-11 DIAGNOSIS — I10 Essential (primary) hypertension: Secondary | ICD-10-CM

## 2019-10-11 DIAGNOSIS — Z801 Family history of malignant neoplasm of trachea, bronchus and lung: Secondary | ICD-10-CM | POA: Insufficient documentation

## 2019-10-11 MED ORDER — ENTRESTO 49-51 MG PO TABS: 1.0000 | ORAL_TABLET | Freq: Two times a day (BID) | ORAL | 3 refills | Status: DC

## 2019-10-11 NOTE — Progress Notes (Signed)
Pharmacist Chemotherapy Monitoring - Follow Up Assessment    I verify that I have reviewed each item in the below checklist:  . Regimen for the patient is scheduled for the appropriate day and plan matches scheduled date. Marland Kitchen Appropriate non-routine labs are ordered dependent on drug ordered. . If applicable, additional medications reviewed and ordered per protocol based on lifetime cumulative doses and/or treatment regimen.   Plan for follow-up and/or issues identified: No . I-vent associated with next due treatment: Yes . MD and/or nursing notified: No  Acquanetta Belling 10/11/2019 4:31 PM

## 2019-10-11 NOTE — Addendum Note (Signed)
Encounter addended by: Scarlette Calico, RN on: 10/11/2019 12:11 PM  Actions taken: Medication long-term status modified, Order list changed, Diagnosis association updated, Clinical Note Signed

## 2019-10-11 NOTE — Progress Notes (Signed)
[place]  Referring Physician: Primary Care: Primary Cardiologist:  HPI:  Ms Zanetti is 34 y.o. female with h/o obesity, HTN and breast cancer referred by Dr. Lindi Adie for enrollment into the Cardio-Oncology program due to reduced EF  SUMMARY OF ONCOLOGIC HISTORY:    Oncology History  Malignant neoplasm of upper-inner quadrant of right breast in female, estrogen receptor positive ([place])  04/17/2018 Initial Diagnosis   Palpable right breast masses UOQ and UIQ with multiple masses 12 to 14 cm, at 1:00 1.6 cm, 1231.8 cm, and o'clock 4.5 cm and at 9:30 position 5 cm: Biopsy revealed IDC with DCIS, grade 3, ER 80%, PR 0%, HER-2 negative, Ki-67 20%; biopsy of the 4.5 cm mass came back as IDC grade 2-3, ER 50%, PR 5%, HER-2 -2+ ratio 1.6, copy #3.2, Ki-67 20%, T2N1 stage IIb   05/09/2018 - 10/08/2018 Neo-Adjuvant Chemotherapy   Neoadjuvant chemotherapy with dose dense Adriamycinand Cytoxan x4 followed by Taxol weekly x12   06/19/2018 Genetic Testing   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 The Multi-Gene Panel offered by [facility] includes sequencing and/or deletion duplication testing of the following 84 genes: AIP, ALK, APC, ATM, AXIN2,BAP1, BARD1, BLM, BMPR1A, BRCA1, BRCA2, BRIP1, CASR, CDC73, CDH1, CDK4, CDKN1B, CDKN1C, CDKN2A (p14ARF), CDKN2A (p16INK4a), CEBPA, CHEK2, CTNNA1, DICER1, DIS3L2, EGFR (c.2369C>T, p.Thr790Met variant only), EPCAM (Deletion/duplication testing only), FH, FLCN, GATA2, GPC3, GREM1 (Promoter region deletion/duplication testing only), HOXB13 (c.251G>A, p.Gly84Glu), HRAS, KIT, MAX, MEN1, MET, MITF (c.952G>A, p.Glu318Lys variant only), MLH1, MSH2, MSH3, MSH6, MUTYH, NBN, NF1, NF2, NTHL1, PALB2, PDGFRA, PHOX2B, PMS2, POLD1, POLE, POT1, PRKAR1A, PTCH1, PTEN, RAD50, RAD51C, RAD51D, RB1, RECQL4, RET, RUNX1, SDHAF2, SDHA (sequence changes only), SDHB, SDHC, SDHD, SMAD4, SMARCA4, SMARCB1, SMARCE1, STK11, SUFU, TERC,  TERT, TMEM127, TP53, TSC1, TSC2, VHL, WRN and WT1. The report date is 06/19/2018.   10/11/2018 Breast MRI   Significant decrease in the size of the right axillary adenopathy and right breast masses from 1.2cm to 0.9cm with minimal residual enhancement, and 2.4x1.4cm to 2.3x1.9cm.   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, largest 1.8 cm, intermediate grade DCIS, margins negative, lymphovascular space invasion present, 0/6 lymph nodes negative, ER 60%, PR 60%, HER-2 positive ratio 2.59   11/20/2018 Cancer Staging   Staging form: Breast, AJCC 8th Edition - Pathologic stage from 11/20/2018: Stage IA (pT1c, pN0, cM0, G2, ER+, PR+, HER2+) - Signed by [name], NP on 12/05/2018   12/14/2018 -  Chemotherapy   The patient had trastuzumab (HERCEPTIN) 546 mg in sodium chloride 0.9 % 250 mL chemo infusion, 8 mg/kg = 546 mg, Intravenous, Once, 3 of 3 cycles Administration: 546 mg (12/14/2018), 399 mg (01/03/2019), 399 mg (01/24/2019) trastuzumab-dkst (OGIVRI) 399 mg in sodium chloride 0.9 % 250 mL chemo infusion, 6 mg/kg = 399 mg (100 % of original dose 6 mg/kg), Intravenous, Once, 8 of 14 cycles Dose modification: 6 mg/kg (original dose 6 mg/kg, Cycle 4, Reason: Other (see comments), Comment: Biosimilar Conversion) Administration: 399 mg (02/14/2019), 399 mg (03/07/2019), 399 mg (03/28/2019), 399 mg (04/18/2019), 399 mg (05/09/2019), 399 mg (05/30/2019), 399 mg (06/20/2019), 399 mg (07/11/2019) for chemotherapy treatment.    01/01/2019 - 02/14/2019 Radiation Therapy    She denies any h/o of known heart disease. Has had uncontrolled HTN. Found to have R breast CA in 11/19. Treated with adriamycin based regimen. Did fine. Post adriamycin echo was normal. Has mastectomy and found to have residual tumor in R  breast which was HER-2+ . Started on Herceptin in 6/20. Echo 10/20 EF 55-60%.  Echo 08/05/19 showwed EF 40-45%.  - adriamycin completed  4/20  - herceptin started 12/14/18  - herceptin held 3/21  Feels good. Denies CP or SOB, No orthopnea or PND. [place] had palpitations for about 30 mins. Felt funny. Felt like she was going to throw up. No syncope. Hasn't recurred Did not check her pulse. Does not wear iWatch. Not checking BP at home   Echo today EF 50-55% GLS - 17.2%  Echo 6/20 EF 55-60% FLS -14.5% Echo 10/20 EF 55-60% GLS -18 Echo 08/05/19 EF 40-45% GLS -15      Past Medical History:  Diagnosis Date  . Cancer [facility])    breast cancer  . Family history of colon cancer   . Family history of lung cancer   . Family history of pancreatic cancer   . Hypertension     Current Outpatient Medications  Medication Sig Dispense Refill  . carvedilol (COREG) 3.125 MG tablet Take 1 tablet (3.125 mg total) by mouth 2 (two) times daily. 60 tablet 3  . fluocinolone (SYNALAR) 0.025 % cream Apply topically 3 (three) times daily. 120 g 2  . hydrochlorothiazide (HYDRODIURIL) 12.5 MG tablet Take 12.5 mg by mouth daily.    Marland Kitchen losartan (COZAAR) 50 MG tablet Take 1 tablet (50 mg total) by mouth at bedtime. 30 tablet 3  . tamoxifen (NOLVADEX) 20 MG tablet Take 1 tablet (20 mg total) by mouth daily. 90 tablet 3   No current facility-administered medications for this encounter.    No Known Allergies    Social History   Socioeconomic History  . Marital status: Single    Spouse name: Not on file  . Number of children: Not on file  . Years of education: Not on file  . Highest education level: Not on file  Occupational History  . Not on file  Tobacco Use  . Smoking status: Never Smoker  . Smokeless tobacco: Never Used  Substance and Sexual Activity  . Alcohol use: Never  . Drug use: Never  . Sexual activity: Not Currently    Birth control/protection: None  Other Topics Concern  . Not on file  Social History Narrative  . Not on file   Social Determinants of Health   Financial Resource Strain:   . Difficulty of Paying  Living Expenses:   Food Insecurity:   . Worried About Charity fundraiser in the Last Year:   . Arboriculturist in the Last Year:   Transportation Needs:   . Film/video editor (Medical):   Marland Kitchen Lack of Transportation (Non-Medical):   Physical Activity:   . Days of Exercise per Week:   . Minutes of Exercise per Session:   Stress:   . Feeling of Stress :   Social Connections:   . Frequency of Communication with Friends and Family:   . Frequency of Social Gatherings with Friends and Family:   . Attends Religious Services:   . Active Member of Clubs or Organizations:   . Attends Archivist Meetings:   Marland Kitchen Marital Status:   Intimate Partner Violence:   . Fear of Current or Ex-Partner:   . Emotionally Abused:   Marland Kitchen Physically Abused:   . Sexually Abused:       Family History  Problem Relation Age of Onset  . Lung cancer Maternal Grandmother   . Lung cancer Maternal Grandfather   . Colon cancer  Maternal Grandfather   . Stomach cancer Paternal Grandfather   . Lung cancer Maternal Aunt   . Pancreatic cancer Maternal Aunt   . Lung cancer Maternal Uncle   . Cancer Cousin        childhood cancer in mat first cousin    Vitals:   10/11/19 1116  BP: (!) 132/98  Pulse: 68  SpO2: 97%  Weight: 74.7 kg (164 lb 9.6 oz)    PHYSICAL EXAM: General:  Well appearing. No respiratory difficulty HEENT: normal Neck: supple. no JVD. Carotids 2+ bilat; no bruits. No lymphadenopathy or thryomegaly appreciated. Cor: PMI nondisplaced. Regular rate & rhythm. No rubs, gallops or murmurs. Lungs: clear Abdomen: soft, nontender, nondistended. No hepatosplenomegaly. No bruits or masses. Good bowel sounds. Extremities: no cyanosis, clubbing, rash, edema Neuro: alert & oriented x 3, cranial nerves grossly intact. moves all 4 extremities w/o difficulty. Affect pleasant.    ASSESSMENT & PLAN:  1. 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 Uncontrolled HTN may also be playing a role - Echo today with improving EF but not completely back to norma.l. EF 50-55%. Unsure if this related to better BP control or holding Herceptin. That said, EF close enough to normal and only has a few Herceptin treatments left. Will restart Herceptin and check echo in 2 months (3 treatments) - Herceptin held in March 2021 - Continue carvedilol 3.125 bid - Stop losartan and HCTX. Switch to Entresto 49/51 bid  2. HTN - BP improving with losartan and carvedilol  - denies h/o snoring - Stop losartan and HCTX. Switch to Entresto 49/51 bid  3. Breast Cancer - as above  4. Palpitations - if recur will need zio patch  Glori Bickers, MD  11:43 AM

## 2019-10-11 NOTE — Progress Notes (Signed)
  Echocardiogram 2D Echocardiogram has been performed.  Neka Bise A Morna Flud 10/11/2019, 10:58 AM

## 2019-10-11 NOTE — Patient Instructions (Signed)
Stop HCTZ  Stop Losartan  Start Entresto 49/51 mg Twice daily   Call and let us know if your palpitations return and we can place a Zio Monitor  Your physician recommends that you schedule a follow-up appointment in: 3 months with an echocardiogram  If you have any questions or concerns before your next appointment please send [place] a message through [place] or call our office at [phone].  At the [facility], you and your health needs are our priority. As part of our continuing mission to provide you with exceptional heart care, we have created designated Provider Care Teams. These Care Teams include your primary Cardiologist (physician) and Advanced Practice Providers (APPs- Physician Assistants and Nurse Practitioners) who all work together to provide you with the care you need, when you need it.   You may see any of the following providers on your designated Care Team at your next follow up: Marland Kitchen Dr Glori Bickers . Dr Loralie Champagne . Darrick Grinder, NP . Lyda Jester, PA . Audry Riles, PharmD   Please be sure to bring in all your medications bottles to every appointment.

## 2019-10-14 ENCOUNTER — Encounter: Payer: Self-pay | Admitting: *Deleted

## 2019-10-15 ENCOUNTER — Telehealth: Payer: Self-pay | Admitting: Hematology and Oncology

## 2019-10-15 NOTE — Telephone Encounter (Signed)
R/s appt per 4/27 sch message to accommodate md appt - unable to reach pt . Left message with appt date and time

## 2019-10-17 ENCOUNTER — Inpatient Hospital Stay: Payer: BC Managed Care – PPO

## 2019-10-17 NOTE — Progress Notes (Signed)
Patient Care Team: [name], DO as PCP - General (Family Medicine) [name], MD as Consulting Physician (General Surgery) [name], MD as Consulting Physician (Hematology and Oncology) [name], RN as Oncology Nurse Navigator [name], RN as Oncology Nurse Navigator Kyung Rudd, MD as Consulting Physician (Radiation Oncology)  DIAGNOSIS:    ICD-10-CM   1. Malignant neoplasm of upper-inner quadrant of right breast in female, estrogen receptor positive ([place])  C50.211    Z17.0     SUMMARY OF ONCOLOGIC HISTORY: Oncology History  Malignant neoplasm of upper-inner quadrant of right breast in female, estrogen receptor positive ([place])  04/17/2018 Initial Diagnosis   Palpable right breast masses UOQ and UIQ with multiple masses 12 to 14 cm, at 1:00 1.6 cm, 1231.8 cm, and o'clock 4.5 cm and at 9:30 position 5 cm: Biopsy revealed IDC with DCIS, grade 3, ER 80%, PR 0%, HER-2 negative, Ki-67 20%; biopsy of the 4.5 cm mass came back as IDC grade 2-3, ER 50%, PR 5%, HER-2 -2+ ratio 1.6, copy #3.2, Ki-67 20%, T2N1 stage IIb   05/09/2018 - 10/08/2018 Neo-Adjuvant Chemotherapy   Neoadjuvant chemotherapy with dose dense Adriamycin and Cytoxan x4 followed by Taxol weekly x12   06/19/2018 Genetic Testing   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  The Multi-Gene Panel offered by [facility] includes sequencing and/or deletion duplication testing of the following 84 genes: AIP, ALK, APC, ATM, AXIN2,BAP1,  BARD1, BLM, BMPR1A, BRCA1, BRCA2, BRIP1, CASR, CDC73, CDH1, CDK4, CDKN1B, CDKN1C, CDKN2A (p14ARF), CDKN2A (p16INK4a), CEBPA, CHEK2, CTNNA1, DICER1, DIS3L2, EGFR (c.2369C>T, p.Thr790Met variant only), EPCAM (Deletion/duplication testing only), FH, FLCN, GATA2, GPC3, GREM1 (Promoter region deletion/duplication testing only), HOXB13 (c.251G>A, p.Gly84Glu), HRAS, KIT, MAX, MEN1, MET, MITF (c.952G>A, p.Glu318Lys variant only), MLH1, MSH2,  MSH3, MSH6, MUTYH, NBN, NF1, NF2, NTHL1, PALB2, PDGFRA, PHOX2B, PMS2, POLD1, POLE, POT1, PRKAR1A, PTCH1, PTEN, RAD50, RAD51C, RAD51D, RB1, RECQL4, RET, RUNX1, SDHAF2, SDHA (sequence changes only), SDHB, SDHC, SDHD, SMAD4, SMARCA4, SMARCB1, SMARCE1, STK11, SUFU, TERC, TERT, TMEM127, TP53, TSC1, TSC2, VHL, WRN and WT1.  The report date is 06/19/2018.   10/11/2018 Breast MRI   Significant decrease in the size of the right axillary adenopathy and right breast masses from 1.2cm to 0.9cm with minimal residual enhancement, and 2.4x1.4cm to 2.3x1.9cm.   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, largest 1.8 cm, intermediate grade DCIS, margins negative, lymphovascular space invasion present, 0/6 lymph nodes negative, ER 60%, PR 60%, HER-2 positive ratio 2.59   11/20/2018 Cancer Staging   Staging form: Breast, AJCC 8th Edition - Pathologic stage from 11/20/2018: Stage IA (pT1c, pN0, cM0, G2, ER+, PR+, HER2+) - Signed by [name], NP on 12/05/2018   12/14/2018 -  Chemotherapy   The patient had trastuzumab (HERCEPTIN) 546 mg in sodium chloride 0.9 % 250 mL chemo infusion, 8 mg/kg = 546 mg, Intravenous,  Once, 3 of 3 cycles Administration: 546 mg (12/14/2018), 399 mg (01/03/2019), 399 mg (01/24/2019) trastuzumab-dkst (OGIVRI) 399 mg in sodium chloride 0.9 % 250 mL chemo infusion, 6 mg/kg = 399 mg (100 % of original dose 6 mg/kg), Intravenous,  Once, 10 of 14 cycles Dose modification: 6 mg/kg (original dose 6 mg/kg, Cycle 4, Reason: Other (see comments), Comment: Biosimilar Conversion) Administration: 399 mg (02/14/2019), 399 mg (03/07/2019), 399 mg (03/28/2019), 399 mg (04/18/2019), 399 mg (05/09/2019), 399 mg (05/30/2019), 399 mg (06/20/2019), 399 mg (07/11/2019), 399 mg (08/01/2019)  for chemotherapy  treatment.    01/01/2019 - 02/14/2019 Radiation Therapy   Adjuvant XRT to right chest wall     CHIEF COMPLIANT: Herceptin maintenance  INTERVAL HISTORY:  Miranda Ayers is a 34 y.o. with above-mentioned history of right breast cancer treated with neoadjuvant chemotherapy, right mastectomy, radiation, andis currently onadjuvant Herceptin maintenanceand antiestrogen therapy with tamoxifen. Echo on 08/05/19 showed a mildly decreased ejection fraction of 40-45% and Herceptin was held. Echo on 10/11/19 showed an ejection fraction of 50-55%. She presents to the clinic todayfor treatment.  ALLERGIES:  has No Known Allergies.  MEDICATIONS:  Current Outpatient Medications  Medication Sig Dispense Refill  . carvedilol (COREG) 3.125 MG tablet Take 1 tablet (3.125 mg total) by mouth 2 (two) times daily. 60 tablet 3  . fluocinolone (SYNALAR) 0.025 % cream Apply topically 3 (three) times daily. 120 g 2  . sacubitril-valsartan (ENTRESTO) 49-51 MG Take 1 tablet by mouth 2 (two) times daily. 60 tablet 3  . tamoxifen (NOLVADEX) 20 MG tablet Take 1 tablet (20 mg total) by mouth daily. 90 tablet 3   No current facility-administered medications for this visit.   Facility-Administered Medications Ordered in Other Visits  Medication Dose Route Frequency Provider Last Rate Last Admin  . 0.9 %  sodium chloride infusion   Intravenous Once [name], MD      . trastuzumab-dkst (OGIVRI) 399 mg in sodium chloride 0.9 % 250 mL chemo infusion  6 mg/kg (Order-Specific) Intravenous Once [name], MD        PHYSICAL EXAMINATION: ECOG PERFORMANCE STATUS: 0 - Asymptomatic  Vitals:   10/18/19 1020  BP: (!) 133/94  Pulse: 78  Resp: 17  Temp: 98.2 F (36.8 C)  SpO2: 100%   Filed Weights   10/18/19 1020  Weight: 162 lb 8 oz (73.7 kg)    LABORATORY DATA:  I have reviewed the data as listed CMP Latest Ref Rng & Units 07/29/2019 03/28/2019 11/16/2018  Glucose 70 - 99 mg/dL 84 94 82  BUN 6 - 20 mg/dL 10 12 7   Creatinine 0.44 - 1.00 mg/dL 0.78 0.76 0.74  Sodium 135 - 145 mmol/L 144 142 141  Potassium 3.5 - 5.1 mmol/L 3.9 3.3(L) 4.8  Chloride 98 - 111 mmol/L  108 108 105  CO2 22 - 32 mmol/L 29 25 28   Calcium 8.9 - 10.3 mg/dL 9.2 9.4 10.2  Total Protein 6.5 - 8.1 g/dL 7.3 7.5 7.6  Total Bilirubin 0.3 - 1.2 mg/dL 0.3 0.3 0.8  Alkaline Phos 38 - 126 U/L 60 77 60  AST 15 - 41 U/L 15 19 21   ALT 0 - 44 U/L 14 19 19     Lab Results  Component Value Date   WBC 3.3 (L) 07/29/2019   HGB 12.6 07/29/2019   HCT 39.3 07/29/2019   MCV 88.3 07/29/2019   PLT 173 07/29/2019   NEUTROABS 1.8 07/29/2019    ASSESSMENT & PLAN:  Malignant neoplasm of upper-inner quadrant of right breast in female, estrogen receptor positive ([place]) 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%, PR 0%, HER-2 negative, Ki-67 20%; biopsy of the 4.5 cm mass came back as IDC grade 2-3, ER 50%, PR 5%, HER-2 -2+ ratio 1.6, copy #3.2, Ki-67 20%, T2N1 stage IIb  Recommendation: 1.Neoadjuvant chemotherapy with dose dense Adriamycinand Cytoxan x4 followed by Taxol weekly x11 05/09/2018-10/02/2018  2.11/20/2018:Rightmastectomy and axillary lymph node dissection 3.Since the final path was HER-2 positive she will get 1 year of Herceptinwhich will be completed in October 2021 4.Adjuvantradiation7/14/2020-02/14/2019 5.Followed by adjuvant antiestrogen therapypatient prefers tamoxifen starting 03/28/2019 ----------------------------------------------------------- 11/20/2018:Right mastectomy: Residual grade 2 invasive ductal carcinoma multifocal, largest 1.8 cm, intermediate grade DCIS, margins negative, lymphovascular space invasion present, 0/6 lymph nodes negative, ER 60%, PR 60%, HER-2 positive ratio 2.59  Current treatment: Herceptin maintenance every 3 weeks ,tamoxifen 20 mg daily 03/28/2019  Herceptin toxicities: Monitoring cardiac ejection fraction Echocardiogram10/26/2020: EF 55 to 60%: Normal Echocardiogram  10/11/2019: EF 50 to 55% Dr. Britta Mccreedy has approved that she can proceed and finish of the remaining Herceptin treatments.  She has 4 more doses left.  Tamoxifen toxicities: Hot flashes but she is able to tolerate them Mild epigastric discomfort which resolved with Protonix.  Return to clinic every 3 weeks for Herceptin every 6 weeks for follow-up with me    No orders of the defined types were placed in this encounter.  The patient has a good understanding of the overall plan. she agrees with it. she will call with any problems that may develop before the next visit here.  Total time spent: 30 mins including face to face time and time spent for planning, charting and coordination of care  [name], MD 10/18/2019  I, Cloyde Reams Dorshimer, am acting as scribe for Dr. [name].  I have reviewed the above documentation for accuracy and completeness, and I agree with the above.

## 2019-10-18 ENCOUNTER — Other Ambulatory Visit: Payer: Self-pay

## 2019-10-18 ENCOUNTER — Inpatient Hospital Stay: Payer: BC Managed Care – PPO | Admitting: Hematology and Oncology

## 2019-10-18 ENCOUNTER — Inpatient Hospital Stay: Payer: BC Managed Care – PPO | Attending: Adult Health

## 2019-10-18 DIAGNOSIS — Z5112 Encounter for antineoplastic immunotherapy: Secondary | ICD-10-CM | POA: Diagnosis not present

## 2019-10-18 DIAGNOSIS — C50211 Malignant neoplasm of upper-inner quadrant of right female breast: Secondary | ICD-10-CM

## 2019-10-18 DIAGNOSIS — Z170 Estrogen receptor positive status [ER+]: Secondary | ICD-10-CM

## 2019-10-18 MED ORDER — TRASTUZUMAB-DKST CHEMO 150 MG IV SOLR
6.0000 mg/kg | Freq: Once | INTRAVENOUS | Status: AC
  Administered 2019-10-18: 399 mg via INTRAVENOUS
  Filled 2019-10-18: qty 19

## 2019-10-18 MED ORDER — DIPHENHYDRAMINE HCL 25 MG PO CAPS
50.0000 mg | ORAL_CAPSULE | Freq: Once | ORAL | Status: AC
  Administered 2019-10-18: 11:00:00 50 mg via ORAL

## 2019-10-18 MED ORDER — DIPHENHYDRAMINE HCL 25 MG PO CAPS
ORAL_CAPSULE | ORAL | Status: AC
  Filled 2019-10-18: qty 2

## 2019-10-18 MED ORDER — ACETAMINOPHEN 325 MG PO TABS
650.0000 mg | ORAL_TABLET | Freq: Once | ORAL | Status: AC
  Administered 2019-10-18: 650 mg via ORAL

## 2019-10-18 MED ORDER — ACETAMINOPHEN 325 MG PO TABS
ORAL_TABLET | ORAL | Status: AC
  Filled 2019-10-18: qty 2

## 2019-10-18 MED ORDER — SODIUM CHLORIDE 0.9 % IV SOLN
Freq: Once | INTRAVENOUS | Status: AC
  Filled 2019-10-18: qty 250

## 2019-10-18 NOTE — Assessment & Plan Note (Signed)
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%, PR 0%, HER-2 negative, Ki-67 20%; biopsy of the 4.5 cm mass came back as IDC grade 2-3, ER 50%, PR 5%, HER-2 -2+ ratio 1.6, copy #3.2, Ki-67 20%, T2N1 stage IIb  Recommendation: 1.Neoadjuvant chemotherapy with dose dense Adriamycinand Cytoxan x4 followed by Taxol weekly x11 05/09/2018-10/02/2018 2.11/20/2018:Rightmastectomy and axillary lymph node dissection 3.Since the final path was HER-2 positive she will get 1 year of Herceptinwhich will be completed in October 2021 4.Adjuvantradiation7/14/2020-02/14/2019 5.Followed by adjuvant antiestrogen therapypatient prefers tamoxifen starting 03/28/2019 ----------------------------------------------------------- 11/20/2018:Right mastectomy: Residual grade 2 invasive ductal carcinoma multifocal, largest 1.8 cm, intermediate grade DCIS, margins negative, lymphovascular space invasion present, 0/6 lymph nodes negative, ER 60%, PR 60%, HER-2 positive ratio 2.59  Current treatment: Herceptin maintenance every 3 weeks to be completed October 2021,tamoxifen 20 mg daily 03/28/2019  Herceptin toxicities: None Echocardiogram10/26/2020: EF 55 to 60%: Normal Tamoxifen toxicities: Hot flashes but she is able to tolerate them Mild epigastric discomfort which resolved with Protonix.  Return to clinic every 3 weeks for Herceptin every 6 weeks for follow-up with me

## 2019-10-18 NOTE — Patient Instructions (Signed)
[facility] Discharge Instructions for Patients Receiving Chemotherapy  Today you received the following chemotherapy agent: Trastuzumab  To help prevent nausea and vomiting after your treatment, we encourage you to take your nausea medication as directed by your MD.   If you develop nausea and vomiting that is not controlled by your nausea medication, call the clinic.   BELOW ARE SYMPTOMS THAT SHOULD BE REPORTED IMMEDIATELY:  *FEVER GREATER THAN 100.5 F  *CHILLS WITH OR WITHOUT FEVER  NAUSEA AND VOMITING THAT IS NOT CONTROLLED WITH YOUR NAUSEA MEDICATION  *UNUSUAL SHORTNESS OF BREATH  *UNUSUAL BRUISING OR BLEEDING  TENDERNESS IN MOUTH AND THROAT WITH OR WITHOUT PRESENCE OF ULCERS  *URINARY PROBLEMS  *BOWEL PROBLEMS  UNUSUAL RASH Items with * indicate a potential emergency and should be followed up as soon as possible.  Feel free to call the clinic should you have any questions or concerns. The clinic phone number is [phone].  Please show the [facility] at check-in to the Emergency Department and triage nurse.  Coronavirus (COVID-19) Are you at risk?  Are you at risk for the Coronavirus (COVID-19)?  To be considered HIGH RISK for Coronavirus (COVID-19), you have to meet the following criteria:  . Traveled to [place], [place], [place], [place] or [place]; or in the [place] to [place], [place], [place], or [place]; and have fever, cough, and shortness of breath within the last 2 weeks of travel OR . Been in close contact with a person diagnosed with COVID-19 within the last 2 weeks and have fever, cough, and shortness of breath . IF YOU DO NOT MEET THESE CRITERIA, YOU ARE CONSIDERED LOW RISK FOR COVID-19.  What to do if you are HIGH RISK for COVID-19?  Marland Kitchen If you are having a medical emergency, call 911. . Seek medical care right away. Before you go to a doctor's office, urgent care or emergency department, call ahead and tell them  about your recent travel, contact with someone diagnosed with COVID-19, and your symptoms. You should receive instructions from your physician's office regarding next steps of care.  . When you arrive at healthcare provider, tell the healthcare staff immediately you have returned from visiting [place], [place], [place], [place] or [place]; or traveled in the [place] to [place], [place], [place], or [place]; in the last two weeks or you have been in close contact with a person diagnosed with COVID-19 in the last 2 weeks.   . Tell the health care staff about your symptoms: fever, cough and shortness of breath. . After you have been seen by a medical provider, you will be either: o Tested for (COVID-19) and discharged home on quarantine except to seek medical care if symptoms worsen, and asked to  - Stay home and avoid contact with others until you get your results (4-5 days)  - Avoid travel on public transportation if possible (such as bus, train, or airplane) or o Sent to the Emergency Department by EMS for evaluation, COVID-19 testing, and possible admission depending on your condition and test results.  What to do if you are LOW RISK for COVID-19?  Reduce your risk of any infection by using the same precautions used for avoiding the common cold or flu:  Marland Kitchen Wash your hands often with soap and warm water for at least 20 seconds.  If soap and water are not readily available, use an alcohol-based hand sanitizer with at least 60% alcohol.  . If  coughing or sneezing, cover your mouth and nose by coughing or sneezing into the elbow areas of your shirt or coat, into a tissue or into your sleeve (not your hands). . Avoid shaking hands with others and consider head nods or verbal greetings only. . Avoid touching your eyes, nose, or mouth with unwashed hands.  . Avoid close contact with people who are sick. . Avoid places or events with large numbers of people in one location, like concerts or  sporting events. . Carefully consider travel plans you have or are making. . If you are planning any travel outside or inside the [place], visit the CDC's Travelers' Health webpage for the latest health notices. . If you have some symptoms but not all symptoms, continue to monitor at home and seek medical attention if your symptoms worsen. . If you are having a medical emergency, call 911.   ADDITIONAL HEALTHCARE OPTIONS FOR PATIENTS  [facility] / e-Visit: [email or web address]         [facility]: [phone]  [facility]: [phone]                   [facility]: [phone]   

## 2019-10-21 ENCOUNTER — Telehealth: Payer: Self-pay | Admitting: Hematology and Oncology

## 2019-10-21 NOTE — Telephone Encounter (Signed)
No 4/30 los. No changes made to pt's schedule.  

## 2019-10-24 ENCOUNTER — Other Ambulatory Visit: Payer: BC Managed Care – PPO

## 2019-10-24 ENCOUNTER — Ambulatory Visit: Payer: BC Managed Care – PPO | Admitting: Hematology and Oncology

## 2019-10-24 ENCOUNTER — Ambulatory Visit: Payer: BC Managed Care – PPO

## 2019-11-06 NOTE — Progress Notes (Signed)
Patient Care Team: [name], DO as PCP - General (Family Medicine) [name], MD as Consulting Physician (General Surgery) [name], MD as Consulting Physician (Hematology and Oncology) [name], RN as Oncology Nurse Navigator [name], RN as Oncology Nurse Navigator Kyung Rudd, MD as Consulting Physician (Radiation Oncology)  DIAGNOSIS:    ICD-10-CM   1. Malignant neoplasm of upper-inner quadrant of right breast in female, estrogen receptor positive ([facility])  C50.211    Z17.0     SUMMARY OF ONCOLOGIC HISTORY: Oncology History  Malignant neoplasm of upper-inner quadrant of right breast in female, estrogen receptor positive ([facility])  04/17/2018 Initial Diagnosis   Palpable right breast masses UOQ and UIQ with multiple masses 12 to 14 cm, at 1:00 1.6 cm, 1231.8 cm, and o'clock 4.5 cm and at 9:30 position 5 cm: Biopsy revealed IDC with DCIS, grade 3, ER 80%, PR 0%, HER-2 negative, Ki-67 20%; biopsy of the 4.5 cm mass came back as IDC grade 2-3, ER 50%, PR 5%, HER-2 -2+ ratio 1.6, copy #3.2, Ki-67 20%, T2N1 stage IIb   05/09/2018 - 10/08/2018 Neo-Adjuvant Chemotherapy   Neoadjuvant chemotherapy with dose dense Adriamycin and Cytoxan x4 followed by Taxol weekly x12   06/19/2018 Genetic Testing   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  The Multi-Gene Panel offered by [facility] includes sequencing and/or deletion duplication testing of the following 84 genes: AIP, ALK, APC, ATM, AXIN2,BAP1,  BARD1, BLM, BMPR1A, BRCA1, BRCA2, BRIP1, CASR, CDC73, CDH1, CDK4, CDKN1B, CDKN1C, CDKN2A (p14ARF), CDKN2A (p16INK4a), CEBPA, CHEK2, CTNNA1, DICER1, DIS3L2, EGFR (c.2369C>T, p.Thr790Met variant only), EPCAM (Deletion/duplication testing only), FH, FLCN, GATA2, GPC3, GREM1 (Promoter region deletion/duplication testing only), HOXB13 (c.251G>A, p.Gly84Glu), HRAS, KIT, MAX, MEN1, MET, MITF (c.952G>A, p.Glu318Lys variant only), MLH1, MSH2,  MSH3, MSH6, MUTYH, NBN, NF1, NF2, NTHL1, PALB2, PDGFRA, PHOX2B, PMS2, POLD1, POLE, POT1, PRKAR1A, PTCH1, PTEN, RAD50, RAD51C, RAD51D, RB1, RECQL4, RET, RUNX1, SDHAF2, SDHA (sequence changes only), SDHB, SDHC, SDHD, SMAD4, SMARCA4, SMARCB1, SMARCE1, STK11, SUFU, TERC, TERT, TMEM127, TP53, TSC1, TSC2, VHL, WRN and WT1.  The report date is 06/19/2018.   10/11/2018 Breast MRI   Significant decrease in the size of the right axillary adenopathy and right breast masses from 1.2cm to 0.9cm with minimal residual enhancement, and 2.4x1.4cm to 2.3x1.9cm.   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, largest 1.8 cm, intermediate grade DCIS, margins negative, lymphovascular space invasion present, 0/6 lymph nodes negative, ER 60%, PR 60%, HER-2 positive ratio 2.59   11/20/2018 Cancer Staging   Staging form: Breast, AJCC 8th Edition - Pathologic stage from 11/20/2018: Stage IA (pT1c, pN0, cM0, G2, ER+, PR+, HER2+) - Signed by [name], NP on 12/05/2018   12/14/2018 -  Chemotherapy   The patient had trastuzumab (HERCEPTIN) 546 mg in sodium chloride 0.9 % 250 mL chemo infusion, 8 mg/kg = 546 mg, Intravenous,  Once, 3 of 3 cycles Administration: 546 mg (12/14/2018), 399 mg (01/03/2019), 399 mg (01/24/2019) trastuzumab-dkst (OGIVRI) 399 mg in sodium chloride 0.9 % 250 mL chemo infusion, 6 mg/kg = 399 mg (100 % of original dose 6 mg/kg), Intravenous,  Once, 10 of 14 cycles Dose modification: 6 mg/kg (original dose 6 mg/kg, Cycle 4, Reason: Other (see comments), Comment: Biosimilar Conversion) Administration: 399 mg (02/14/2019), 399 mg (03/07/2019), 399 mg (03/28/2019), 399 mg (04/18/2019), 399 mg (05/09/2019), 399 mg (05/30/2019), 399 mg (06/20/2019), 399 mg (07/11/2019), 399 mg (08/01/2019), 399 mg (10/18/2019)  for chemotherapy treatment.    01/01/2019 - 02/14/2019 Radiation Therapy   Adjuvant XRT to right chest wall     CHIEF COMPLIANT: Herceptin  maintenance  INTERVAL HISTORY: Miranda Ayers is a 34 y.o. with above-mentioned history of right breast cancer treated with neoadjuvant chemotherapy, right mastectomy, radiation, andis currently onadjuvant Herceptin maintenanceand antiestrogen therapy with tamoxifen. She presents to the clinic todayfor treatment.  ALLERGIES:  has No Known Allergies.  MEDICATIONS:  Current Outpatient Medications  Medication Sig Dispense Refill  . carvedilol (COREG) 3.125 MG tablet Take 1 tablet (3.125 mg total) by mouth 2 (two) times daily. 60 tablet 3  . fluocinolone (SYNALAR) 0.025 % cream Apply topically 3 (three) times daily. 120 g 2  . sacubitril-valsartan (ENTRESTO) 49-51 MG Take 1 tablet by mouth 2 (two) times daily. 60 tablet 3  . tamoxifen (NOLVADEX) 20 MG tablet Take 1 tablet (20 mg total) by mouth daily. 90 tablet 3   No current facility-administered medications for this visit.    PHYSICAL EXAMINATION: ECOG PERFORMANCE STATUS: 1 - Symptomatic but completely ambulatory  Vitals:   11/07/19 0854  BP: (!) 142/104  Pulse: 91  Resp: 17  Temp: 98.3 F (36.8 C)  SpO2: 100%   Filed Weights   11/07/19 0854  Weight: 164 lb (74.4 kg)    LABORATORY DATA:  I have reviewed the data as listed CMP Latest Ref Rng & Units 07/29/2019 03/28/2019 11/16/2018  Glucose 70 - 99 mg/dL 84 94 82  BUN 6 - 20 mg/dL _0 Creatinine 0.44 - 1.00 mg/dL 0.78 0.76 0.74  Sodium 135 - 145 mmol/L 144 142 141  Potassium 3.5 - 5.1 mmol/L 3.9 3.3(L) 4.8  Chloride 98 - 111 mmol/L 108 108 105  CO2 22 - 32 mmol/L _1 Calcium 8.9 - 10.3 mg/dL 9.2 9.4 10.2  Total Protein 6.5 - 8.1 g/dL 7.3 7.5 7.6  Total Bilirubin 0.3 - 1.2 mg/dL 0.3 0.3 0.8  Alkaline Phos 38 - 126 U/L 60 77 60  AST 15 - 41 U/L _2 ALT 0 - 44 U/L _3 Lab Results  Component Value Date   WBC 4.3 11/07/2019   HGB 13.3 11/07/2019   HCT 42.0 11/07/2019   MCV 90.1 11/07/2019   PLT 154 11/07/2019   NEUTROABS 2.3 11/07/2019     ASSESSMENT & PLAN:  Malignant neoplasm of upper-inner quadrant of right breast in female, estrogen receptor positive ([facility]) 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%, PR 0%, HER-2 negative, Ki-67 20%; biopsy of the 4.5 cm mass came back as IDC grade 2-3, ER 50%, PR 5%, HER-2 -2+ ratio 1.6, copy #3.2, Ki-67 20%, T2N1 stage IIb  Recommendation: 1.Neoadjuvant chemotherapy with dose dense Adriamycinand Cytoxan x4 followed by Taxol weekly x11 05/09/2018-10/02/2018 2.11/20/2018:Rightmastectomy and axillary lymph node dissection 3.Since the final path was HER-2 positive she will get 1 year of Herceptinwhich will be completed in October 2021 4.Adjuvantradiation7/14/2020-02/14/2019 5.Followed by adjuvant antiestrogen therapypatient prefers tamoxifen starting 03/28/2019 ----------------------------------------------------------- 11/20/2018:Right mastectomy: Residual grade 2 invasive ductal carcinoma multifocal, largest 1.8 cm, intermediate grade DCIS, margins negative, lymphovascular space invasion present, 0/6 lymph nodes negative, ER 60%, PR 60%, HER-2 positive ratio 2.59  Current treatment: Herceptin maintenance every 3 weeks ,tamoxifen 20 mg daily 03/28/2019 last Herceptin will be  on 01/17/2020  Herceptin toxicities: Monitoring cardiac ejection fraction Echocardiogram10/26/2020: EF 55 to 60%: Normal Echocardiogram 10/11/2019: EF 50 to 55%  Tamoxifen toxicities:Hot flashes  Return to clinic every 3 weeks for Herceptin every 6 weeks for follow-up with me Patient would like to move some appointments for other conflicts with her schedule.    No orders of the defined types were placed in this encounter.  The patient has a good understanding of the overall plan. she agrees with it. she will call with any problems that  may develop before the next visit here.  Total time spent: 30 mins including face to face time and time spent for planning, charting and coordination of care  [name], MD 11/07/2019  I, Cloyde Reams Dorshimer, am acting as scribe for Dr. [name].  I have reviewed the above documentation for accuracy and completeness, and I agree with the above.

## 2019-11-07 ENCOUNTER — Inpatient Hospital Stay: Payer: BC Managed Care – PPO

## 2019-11-07 ENCOUNTER — Inpatient Hospital Stay (HOSPITAL_BASED_OUTPATIENT_CLINIC_OR_DEPARTMENT_OTHER): Payer: BC Managed Care – PPO | Admitting: Hematology and Oncology

## 2019-11-07 ENCOUNTER — Other Ambulatory Visit: Payer: Self-pay

## 2019-11-07 ENCOUNTER — Inpatient Hospital Stay: Payer: BC Managed Care – PPO | Attending: Hematology and Oncology

## 2019-11-07 VITALS — BP 139/100

## 2019-11-07 DIAGNOSIS — Z170 Estrogen receptor positive status [ER+]: Secondary | ICD-10-CM | POA: Diagnosis not present

## 2019-11-07 DIAGNOSIS — C50211 Malignant neoplasm of upper-inner quadrant of right female breast: Secondary | ICD-10-CM

## 2019-11-07 DIAGNOSIS — Z5112 Encounter for antineoplastic immunotherapy: Secondary | ICD-10-CM | POA: Insufficient documentation

## 2019-11-07 DIAGNOSIS — Z79810 Long term (current) use of selective estrogen receptor modulators (SERMs): Secondary | ICD-10-CM | POA: Diagnosis not present

## 2019-11-07 LAB — CBC WITH DIFFERENTIAL (CANCER CENTER ONLY)
Abs Immature Granulocytes: 0.01 10*3/uL (ref 0.00–0.07)
Basophils Absolute: 0 10*3/uL (ref 0.0–0.1)
Basophils Relative: 1 %
Eosinophils Absolute: 0.1 10*3/uL (ref 0.0–0.5)
Eosinophils Relative: 2 %
HCT: 42 % (ref 36.0–46.0)
Hemoglobin: 13.3 g/dL (ref 12.0–15.0)
Immature Granulocytes: 0 %
Lymphocytes Relative: 34 %
Lymphs Abs: 1.5 10*3/uL (ref 0.7–4.0)
MCH: 28.5 pg (ref 26.0–34.0)
MCHC: 31.7 g/dL (ref 30.0–36.0)
MCV: 90.1 fL (ref 80.0–100.0)
Monocytes Absolute: 0.4 10*3/uL (ref 0.1–1.0)
Monocytes Relative: 10 %
Neutro Abs: 2.3 10*3/uL (ref 1.7–7.7)
Neutrophils Relative %: 53 %
Platelet Count: 154 10*3/uL (ref 150–400)
RBC: 4.66 MIL/uL (ref 3.87–5.11)
RDW: 13.6 % (ref 11.5–15.5)
WBC Count: 4.3 10*3/uL (ref 4.0–10.5)
nRBC: 0 % (ref 0.0–0.2)

## 2019-11-07 LAB — CMP (CANCER CENTER ONLY)
ALT: 12 U/L (ref 0–44)
AST: 16 U/L (ref 15–41)
Albumin: 3.7 g/dL (ref 3.5–5.0)
Alkaline Phosphatase: 51 U/L (ref 38–126)
Anion gap: 8 (ref 5–15)
BUN: 9 mg/dL (ref 6–20)
CO2: 24 mmol/L (ref 22–32)
Calcium: 8.9 mg/dL (ref 8.9–10.3)
Chloride: 108 mmol/L (ref 98–111)
Creatinine: 0.75 mg/dL (ref 0.44–1.00)
GFR, Est AFR Am: >60 mL/min (ref >60)
GFR, Estimated: >60 mL/min (ref >60)
Glucose, Bld: 86 mg/dL (ref 70–99)
Potassium: 3.9 mmol/L (ref 3.5–5.1)
Sodium: 140 mmol/L (ref 135–145)
Total Bilirubin: 0.3 mg/dL (ref 0.3–1.2)
Total Protein: 7.1 g/dL (ref 6.5–8.1)

## 2019-11-07 MED ORDER — DIPHENHYDRAMINE HCL 25 MG PO CAPS
50.0000 mg | ORAL_CAPSULE | Freq: Once | ORAL | Status: AC
  Administered 2019-11-07: 50 mg via ORAL

## 2019-11-07 MED ORDER — TRASTUZUMAB-DKST CHEMO 150 MG IV SOLR
6.0000 mg/kg | Freq: Once | INTRAVENOUS | Status: AC
  Administered 2019-11-07: 399 mg via INTRAVENOUS
  Filled 2019-11-07: qty 19

## 2019-11-07 MED ORDER — SODIUM CHLORIDE 0.9 % IV SOLN
Freq: Once | INTRAVENOUS | Status: AC
  Filled 2019-11-07: qty 250

## 2019-11-07 MED ORDER — ACETAMINOPHEN 325 MG PO TABS
ORAL_TABLET | ORAL | Status: AC
  Filled 2019-11-07: qty 1

## 2019-11-07 MED ORDER — DIPHENHYDRAMINE HCL 25 MG PO CAPS
ORAL_CAPSULE | ORAL | Status: AC
  Filled 2019-11-07: qty 2

## 2019-11-07 MED ORDER — ACETAMINOPHEN 325 MG PO TABS
650.0000 mg | ORAL_TABLET | Freq: Once | ORAL | Status: AC
  Administered 2019-11-07: 650 mg via ORAL

## 2019-11-07 MED ORDER — ACETAMINOPHEN 325 MG PO TABS
ORAL_TABLET | ORAL | Status: AC
  Filled 2019-11-07: qty 2

## 2019-11-07 NOTE — Assessment & Plan Note (Signed)
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%, PR 0%, HER-2 negative, Ki-67 20%; biopsy of the 4.5 cm mass came back as IDC grade 2-3, ER 50%, PR 5%, HER-2 -2+ ratio 1.6, copy #3.2, Ki-67 20%, T2N1 stage IIb  Recommendation: 1.Neoadjuvant chemotherapy with dose dense Adriamycinand Cytoxan x4 followed by Taxol weekly x11 05/09/2018-10/02/2018 2.11/20/2018:Rightmastectomy and axillary lymph node dissection 3.Since the final path was HER-2 positive she will get 1 year of Herceptinwhich will be completed in October 2021 4.Adjuvantradiation7/14/2020-02/14/2019 5.Followed by adjuvant antiestrogen therapypatient prefers tamoxifen starting 03/28/2019 ----------------------------------------------------------- 11/20/2018:Right mastectomy: Residual grade 2 invasive ductal carcinoma multifocal, largest 1.8 cm, intermediate grade DCIS, margins negative, lymphovascular space invasion present, 0/6 lymph nodes negative, ER 60%, PR 60%, HER-2 positive ratio 2.59  Current treatment: Herceptin maintenance every 3 weeks ,tamoxifen 20 mg daily 03/28/2019  Herceptin toxicities: Monitoring cardiac ejection fraction Echocardiogram10/26/2020: EF 55 to 60%: Normal Echocardiogram 10/11/2019: EF 50 to 55% Dr. Britta Mccreedy has approved that she can proceed and finish of the remaining Herceptin treatments.  She has 4 more doses left.  Tamoxifen toxicities:Hot flashes but she is able to tolerate them Mild epigastric discomfort which resolved with Protonix.  Return to clinic every 3 weeks for Herceptin every 6 weeks for follow-up with me

## 2019-11-07 NOTE — Progress Notes (Signed)
Per Dr. Lindi Adie, ok to treat with BP 139/100.

## 2019-11-07 NOTE — Patient Instructions (Signed)
[facility] °Discharge Instructions for Patients Receiving Chemotherapy ° °Today you received the following chemotherapy agents Trastuzumab ° °To help prevent nausea and vomiting after your treatment, we encourage you to take your nausea medication as directed. °  °If you develop nausea and vomiting that is not controlled by your nausea medication, call the clinic.  ° °BELOW ARE SYMPTOMS THAT SHOULD BE REPORTED IMMEDIATELY: °· *FEVER GREATER THAN 100.5 F °· *CHILLS WITH OR WITHOUT FEVER °· NAUSEA AND VOMITING THAT IS NOT CONTROLLED WITH YOUR NAUSEA MEDICATION °· *UNUSUAL SHORTNESS OF BREATH °· *UNUSUAL BRUISING OR BLEEDING °· TENDERNESS IN MOUTH AND THROAT WITH OR WITHOUT PRESENCE OF ULCERS °· *URINARY PROBLEMS °· *BOWEL PROBLEMS °· UNUSUAL RASH °Items with * indicate a potential emergency and should be followed up as soon as possible. ° °Feel free to call the clinic should you have any questions or concerns. The clinic phone number is [phone]. ° °Please show the CHEMO ALERT CARD at check-in to the Emergency Department and triage nurse. ° ° °

## 2019-11-14 ENCOUNTER — Ambulatory Visit: Payer: BC Managed Care – PPO

## 2019-11-20 ENCOUNTER — Telehealth: Payer: Self-pay | Admitting: Hematology and Oncology

## 2019-11-20 NOTE — Telephone Encounter (Signed)
Rescheduled appt with Dr. Lindi Adie to [place] on 6/18. Provider on PAL. Pt is aware.

## 2019-11-26 ENCOUNTER — Telehealth (HOSPITAL_COMMUNITY): Payer: Self-pay | Admitting: *Deleted

## 2019-11-26 ENCOUNTER — Encounter (HOSPITAL_COMMUNITY): Payer: Self-pay

## 2019-11-26 NOTE — Telephone Encounter (Signed)
Called and spoke with patient she is having chest pains, shortness of breath, and cant lift anything. Pt advised to go to the ED. Pt will go to ED in [place] and have them fax notes to our clinic.

## 2019-11-28 ENCOUNTER — Ambulatory Visit: Payer: BC Managed Care – PPO

## 2019-11-28 ENCOUNTER — Ambulatory Visit: Payer: BC Managed Care – PPO | Admitting: Hematology and Oncology

## 2019-12-05 ENCOUNTER — Telehealth: Payer: Self-pay | Admitting: Hematology and Oncology

## 2019-12-05 NOTE — Telephone Encounter (Signed)
Scheduled per 05/20 los, patient has been called and notified.

## 2019-12-06 ENCOUNTER — Encounter: Payer: Self-pay | Admitting: Adult Health

## 2019-12-06 ENCOUNTER — Other Ambulatory Visit: Payer: Self-pay

## 2019-12-06 ENCOUNTER — Telehealth: Payer: Self-pay | Admitting: Adult Health

## 2019-12-06 ENCOUNTER — Inpatient Hospital Stay: Payer: BC Managed Care – PPO | Attending: Adult Health

## 2019-12-06 ENCOUNTER — Inpatient Hospital Stay: Payer: BC Managed Care – PPO | Admitting: Adult Health

## 2019-12-06 ENCOUNTER — Ambulatory Visit: Payer: BC Managed Care – PPO | Admitting: Hematology and Oncology

## 2019-12-06 VITALS — BP 143/84 | HR 78 | Temp 98.0°F | Resp 20 | Ht 61.0 in | Wt 165.6 lb

## 2019-12-06 DIAGNOSIS — Z170 Estrogen receptor positive status [ER+]: Secondary | ICD-10-CM | POA: Insufficient documentation

## 2019-12-06 DIAGNOSIS — C50211 Malignant neoplasm of upper-inner quadrant of right female breast: Secondary | ICD-10-CM | POA: Insufficient documentation

## 2019-12-06 DIAGNOSIS — Z5112 Encounter for antineoplastic immunotherapy: Secondary | ICD-10-CM | POA: Insufficient documentation

## 2019-12-06 MED ORDER — DIPHENHYDRAMINE HCL 25 MG PO CAPS
ORAL_CAPSULE | ORAL | Status: AC
  Filled 2019-12-06: qty 2

## 2019-12-06 MED ORDER — TRASTUZUMAB-DKST CHEMO 150 MG IV SOLR
450.0000 mg | Freq: Once | INTRAVENOUS | Status: AC
  Administered 2019-12-06: 450 mg via INTRAVENOUS
  Filled 2019-12-06: qty 21.43

## 2019-12-06 MED ORDER — ACETAMINOPHEN 325 MG PO TABS
650.0000 mg | ORAL_TABLET | Freq: Once | ORAL | Status: AC
  Administered 2019-12-06: 650 mg via ORAL

## 2019-12-06 MED ORDER — TRASTUZUMAB-DKST CHEMO 150 MG IV SOLR: 6.0000 mg/kg | Freq: Once | INTRAVENOUS | Status: DC

## 2019-12-06 MED ORDER — DIPHENHYDRAMINE HCL 25 MG PO CAPS
50.0000 mg | ORAL_CAPSULE | Freq: Once | ORAL | Status: AC
  Administered 2019-12-06: 50 mg via ORAL

## 2019-12-06 MED ORDER — SODIUM CHLORIDE 0.9 % IV SOLN
Freq: Once | INTRAVENOUS | Status: AC
  Filled 2019-12-06: qty 250

## 2019-12-06 MED ORDER — ACETAMINOPHEN 325 MG PO TABS
ORAL_TABLET | ORAL | Status: AC
  Filled 2019-12-06: qty 2

## 2019-12-06 NOTE — Assessment & Plan Note (Addendum)
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%, PR 0%, HER-2 negative, Ki-67 20%; biopsy of the 4.5 cm mass came back as IDC grade 2-3, ER 50%, PR 5%, HER-2 -2+ ratio 1.6, copy #3.2, Ki-67 20%, T2N1 stage IIb  Recommendation: 1.Neoadjuvant chemotherapy with dose dense Adriamycinand Cytoxan x4 followed by Taxol weekly x11 05/09/2018-10/02/2018 2.11/20/2018:Rightmastectomy and axillary lymph node dissection 3.Since the final path was HER-2 positive she will get 1 year of Herceptinwhich will be completed in October 2021 4.Adjuvantradiation7/14/2020-02/14/2019 5.Followed by adjuvant antiestrogen therapypatient prefers tamoxifen starting 03/28/2019 ----------------------------------------------------------- 11/20/2018:Right mastectomy: Residual grade 2 invasive ductal carcinoma multifocal, largest 1.8 cm, intermediate grade DCIS, margins negative, lymphovascular space invasion present, 0/6 lymph nodes negative, ER 60%, PR 60%, HER-2 positive ratio 2.59  Current treatment: Herceptin maintenance every 3 weeks ,tamoxifen 20 mg daily 03/28/2019  Herceptin toxicities: Monitoring cardiac ejection fraction Echocardiogram10/26/2020: EF 55 to 60%: Normal Echocardiogram 10/11/2019: EF 50 to 55% Dr. Haroldine Laws has approved that she can proceed and finish of the remaining Herceptin treatments.  She has 4 more doses left and will see him 01/16/2020   Tamoxifen toxicities:Manageable hot flashes.  Will continue this daily.  She is overdue for her left breast screening mammogram.  My nurse Merleen Nicely called and got this set up for her today.    She will return every 3 weeks for treatment.  She finishes treatment on 01/17/2020.  I will see her prior to this appointment for her SCP visit.  I have requested scheduling update her appointments.

## 2019-12-06 NOTE — Progress Notes (Signed)
[facility] Follow up:    Miranda Ayers, [name] [address]   DIAGNOSIS: Cancer Staging Malignant neoplasm of upper-inner quadrant of right breast in female, estrogen receptor positive ([name]) Staging form: Breast, AJCC 8th Edition - Clinical: Stage IIIA (cT2, cN1, cM0, G3, ER+, PR-, HER2-) - Unsigned - Pathologic stage from 11/20/2018: Stage IA (pT1c, pN0, cM0, G2, ER+, PR+, HER2+) - Signed by [name], NP on 12/05/2018   SUMMARY OF ONCOLOGIC HISTORY: Oncology History  Malignant neoplasm of upper-inner quadrant of right breast in female, estrogen receptor positive ([name])  04/17/2018 Initial Diagnosis   Palpable right breast masses UOQ and UIQ with multiple masses 12 to 14 cm, at 1:00 1.6 cm, 1231.8 cm, and o'clock 4.5 cm and at 9:30 position 5 cm: Biopsy revealed IDC with DCIS, grade 3, ER 80%, PR 0%, HER-2 negative, Ki-67 20%; biopsy of the 4.5 cm mass came back as IDC grade 2-3, ER 50%, PR 5%, HER-2 -2+ ratio 1.6, copy #3.2, Ki-67 20%, T2N1 stage IIb   05/09/2018 - 10/08/2018 Neo-Adjuvant Chemotherapy   Neoadjuvant chemotherapy with dose dense Adriamycin and Cytoxan x4 followed by Taxol weekly x12   06/19/2018 Genetic Testing   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  The Multi-Gene Panel offered by [facility] includes sequencing and/or deletion duplication testing of the following 84 genes: AIP, ALK, APC, ATM, AXIN2,BAP1,  BARD1, BLM, BMPR1A, BRCA1, BRCA2, BRIP1, CASR, CDC73, CDH1, CDK4, CDKN1B, CDKN1C, CDKN2A (p14ARF), CDKN2A (p16INK4a), CEBPA, CHEK2, CTNNA1, DICER1, DIS3L2, EGFR (c.2369C>T, p.Thr790Met variant only), EPCAM (Deletion/duplication testing only), FH, FLCN, GATA2, GPC3, GREM1 (Promoter region deletion/duplication testing only), HOXB13 (c.251G>A, p.Gly84Glu), HRAS, KIT, MAX, MEN1, MET, MITF (c.952G>A, p.Glu318Lys variant only), MLH1, MSH2, MSH3, MSH6, MUTYH, NBN, NF1, NF2,  NTHL1, PALB2, PDGFRA, PHOX2B, PMS2, POLD1, POLE, POT1, PRKAR1A, PTCH1, PTEN, RAD50, RAD51C, RAD51D, RB1, RECQL4, RET, RUNX1, SDHAF2, SDHA (sequence changes only), SDHB, SDHC, SDHD, SMAD4, SMARCA4, SMARCB1, SMARCE1, STK11, SUFU, TERC, TERT, TMEM127, TP53, TSC1, TSC2, VHL, WRN and WT1.  The report date is 06/19/2018.   10/11/2018 Breast MRI   Significant decrease in the size of the right axillary adenopathy and right breast masses from 1.2cm to 0.9cm with minimal residual enhancement, and 2.4x1.4cm to 2.3x1.9cm.   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, largest 1.8 cm, intermediate grade DCIS, margins negative, lymphovascular space invasion present, 0/6 lymph nodes negative, ER 60%, PR 60%, HER-2 positive ratio 2.59   11/20/2018 Cancer Staging   Staging form: Breast, AJCC 8th Edition - Pathologic stage from 11/20/2018: Stage IA (pT1c, pN0, cM0, G2, ER+, PR+, HER2+) - Signed by [name], NP on 12/05/2018   12/14/2018 -  Chemotherapy   The patient had trastuzumab (HERCEPTIN) 546 mg in sodium chloride 0.9 % 250 mL chemo infusion, 8 mg/kg = 546 mg, Intravenous,  Once, 3 of 3 cycles Administration: 546 mg (12/14/2018), 399 mg (01/03/2019), 399 mg (01/24/2019) trastuzumab-dkst (OGIVRI) 399 mg in sodium chloride 0.9 % 250 mL chemo infusion, 6 mg/kg = 399 mg (100 % of original dose 6 mg/kg), Intravenous,  Once, 11 of 14 cycles Dose modification: 6 mg/kg (original dose 6 mg/kg, Cycle 4, Reason: Other (see comments), Comment: Biosimilar Conversion) Administration: 399 mg (02/14/2019), 399 mg (03/07/2019), 399 mg (03/28/2019), 399 mg (04/18/2019), 399 mg (05/09/2019), 399 mg (05/30/2019), 399 mg (06/20/2019), 399 mg (07/11/2019), 399 mg (08/01/2019), 399 mg (10/18/2019), 399 mg (11/07/2019)  for chemotherapy treatment.    01/01/2019 - 02/14/2019 Radiation Therapy   Adjuvant XRT to right chest wall   03/2019 -  Anti-estrogen oral therapy   Tamoxifen daily      CURRENT THERAPY: Tamoxifen and Herceptin  INTERVAL HISTORY: Miranda Ayers 34 y.o. female returns for evaluation of her HER-2 positive breast cancer, she is finishing up her Herceptin treatments and has four more left.  She is tolerating these well.  She sees Dr. Haroldine Laws closely as he is monitoring her heart.  She sees him again in 12/2019.     Patient Active Problem List   Diagnosis Date Noted  . Neutropenia, drug-induced ([place]) 08/20/2018  . Genetic testing 06/22/2018  . Family history of lung cancer   . Family history of colon cancer   . Family history of pancreatic cancer   . Malignant neoplasm of upper-inner quadrant of right breast in female, estrogen receptor positive (Miranda Ayers) 04/19/2018    has No Known Allergies.  MEDICAL HISTORY: Past Medical History:  Diagnosis Date  . Cancer [facility])    breast cancer  . Family history of colon cancer   . Family history of lung cancer   . Family history of pancreatic cancer   . Hypertension     SURGICAL HISTORY: Past Surgical History:  Procedure Laterality Date  . MASTECTOMY WITH RADIOACTIVE SEED GUIDED EXCISION AND AXILLARY SENTINEL LYMPH NODE BIOPSY Right 11/20/2018   Procedure: RIGHT SIMPLE MASTECTOMY WITH RIGHT RADIOACTIVE SEED TARGETED LYMPH NODE BIOPSY AND RIGHT  AXILLARY SENTINEL LYMPH NODE MAPPING AND PORT REMOVEL RIGHT CHEST;  Surgeon: [name], MD;  Location: [facility];  Service: General;  Laterality: Right;  . OOPHORECTOMY  2013  . PORTACATH PLACEMENT N/A 05/08/2018   Procedure: INSERTION PORT-A-CATH WITH ULTRASOUND;  Surgeon: [name], MD;  Location: [facility];  Service: General;  Laterality: N/A;    SOCIAL HISTORY: Social History   Socioeconomic History  . Marital status: Single    Spouse name: Not on file  . Number of children: Not on file  . Years of education: Not on file  . Highest education level: Not on file  Occupational History  . Not on file  Tobacco Use  .  Smoking status: Never Smoker  . Smokeless tobacco: Never Used  Vaping Use  . Vaping Use: Never used  Substance and Sexual Activity  . Alcohol use: Never  . Drug use: Never  . Sexual activity: Not Currently    Birth control/protection: None  Other Topics Concern  . Not on file  Social History Narrative  . Not on file   Social Determinants of Health   Financial Resource Strain:   . Difficulty of Paying Living Expenses:   Food Insecurity:   . Worried About Charity fundraiser in the Last Year:   . Arboriculturist in the Last Year:   Transportation Needs:   . Film/video editor (Medical):   Marland Kitchen Lack of Transportation (Non-Medical):   Physical Activity:   . Days of Exercise per Week:   . Minutes of Exercise per Session:   Stress:   . Feeling of Stress :   Social Connections:   . Frequency of Communication with Friends and Family:   . Frequency of Social Gatherings with Friends and Family:   . Attends Religious Services:   . Active Member of Clubs or Organizations:   . Attends Archivist Meetings:   Marland Kitchen Marital Status:   Intimate Partner Violence:   .  Fear of Current or Ex-Partner:   . Emotionally Abused:   Marland Kitchen Physically Abused:   . Sexually Abused:     FAMILY HISTORY: Family History  Problem Relation Age of Onset  . Lung cancer Maternal Grandmother   . Lung cancer Maternal Grandfather   . Colon cancer Maternal Grandfather   . Stomach cancer Paternal Grandfather   . Lung cancer Maternal Aunt   . Pancreatic cancer Maternal Aunt   . Lung cancer Maternal Uncle   . Cancer Cousin        childhood cancer in mat first cousin    Review of Systems  Constitutional: Negative for appetite change, chills, fatigue and unexpected weight change.  HENT:   Negative for hearing loss, lump/mass and trouble swallowing.   Eyes: Negative for eye problems and icterus.  Respiratory: Negative for chest tightness, cough and shortness of breath.   Cardiovascular: Negative for  chest pain, leg swelling and palpitations.  Gastrointestinal: Negative for abdominal distention and abdominal pain.  Skin: Negative for itching and rash.  Hematological: Negative for adenopathy. Does not bruise/bleed easily.  Psychiatric/Behavioral: Negative for depression. The patient is not nervous/anxious.       PHYSICAL EXAMINATION  ECOG PERFORMANCE STATUS: 0 - Asymptomatic  Vitals:   12/06/19 1026  BP: (!) 143/84  Pulse: 78  Resp: 20  Temp: 98 F (36.7 C)  SpO2: 100%    Physical Exam Constitutional:      General: She is not in acute distress.    Appearance: Normal appearance. She is not toxic-appearing.  HENT:     Head: Normocephalic and atraumatic.  Eyes:     General: No scleral icterus. Cardiovascular:     Rate and Rhythm: Normal rate and regular rhythm.     Pulses: Normal pulses.     Heart sounds: Normal heart sounds.  Pulmonary:     Effort: Pulmonary effort is normal. No respiratory distress.     Breath sounds: Normal breath sounds. No wheezing.  Abdominal:     General: Abdomen is flat. Bowel sounds are normal. There is no distension.     Palpations: Abdomen is soft.     Tenderness: There is no abdominal tenderness.  Musculoskeletal:        General: No swelling.     Cervical back: Neck supple.  Skin:    General: Skin is warm and dry.     Capillary Refill: Capillary refill takes less than 2 seconds.     Findings: No rash.  Neurological:     General: No focal deficit present.     Mental Status: She is alert.  Psychiatric:        Mood and Affect: Mood normal.        Behavior: Behavior normal.     LABORATORY DATA:  CBC    Component Value Date/Time   WBC 4.3 11/07/2019 0825   WBC 2.7 (L) 11/16/2018 1507   RBC 4.66 11/07/2019 0825   HGB 13.3 11/07/2019 0825   HCT 42.0 11/07/2019 0825   PLT 154 11/07/2019 0825   MCV 90.1 11/07/2019 0825   MCH 28.5 11/07/2019 0825   MCHC 31.7 11/07/2019 0825   RDW 13.6 11/07/2019 0825   LYMPHSABS 1.5 11/07/2019  0825   MONOABS 0.4 11/07/2019 0825   EOSABS 0.1 11/07/2019 0825   BASOSABS 0.0 11/07/2019 0825    CMP     Component Value Date/Time   NA 140 11/07/2019 0825   K 3.9 11/07/2019 0825   CL 108 11/07/2019 0825  CO2 24 11/07/2019 0825   GLUCOSE 86 11/07/2019 0825   BUN 9 11/07/2019 0825   CREATININE 0.75 11/07/2019 0825   CALCIUM 8.9 11/07/2019 0825   PROT 7.1 11/07/2019 0825   ALBUMIN 3.7 11/07/2019 0825   AST 16 11/07/2019 0825   ALT 12 11/07/2019 0825   ALKPHOS 51 11/07/2019 0825   BILITOT 0.3 11/07/2019 0825   GFRNONAA >60 11/07/2019 0825   GFRAA >60 11/07/2019 0825     ASSESSMENT and THERAPY PLAN:   Malignant neoplasm of upper-inner quadrant of right breast in female, estrogen receptor positive ([place]) 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%, PR 0%, HER-2 negative, Ki-67 20%; biopsy of the 4.5 cm mass came back as IDC grade 2-3, ER 50%, PR 5%, HER-2 -2+ ratio 1.6, copy #3.2, Ki-67 20%, T2N1 stage IIb  Recommendation: 1.Neoadjuvant chemotherapy with dose dense Adriamycinand Cytoxan x4 followed by Taxol weekly x11 05/09/2018-10/02/2018 2.11/20/2018:Rightmastectomy and axillary lymph node dissection 3.Since the final path was HER-2 positive she will get 1 year of Herceptinwhich will be completed in October 2021 4.Adjuvantradiation7/14/2020-02/14/2019 5.Followed by adjuvant antiestrogen therapypatient prefers tamoxifen starting 03/28/2019 ----------------------------------------------------------- 11/20/2018:Right mastectomy: Residual grade 2 invasive ductal carcinoma multifocal, largest 1.8 cm, intermediate grade DCIS, margins negative, lymphovascular space invasion present, 0/6 lymph nodes negative, ER 60%, PR 60%, HER-2 positive ratio 2.59  Current treatment: Herceptin maintenance every 3 weeks  ,tamoxifen 20 mg daily 03/28/2019  Herceptin toxicities: Monitoring cardiac ejection fraction Echocardiogram10/26/2020: EF 55 to 60%: Normal Echocardiogram 10/11/2019: EF 50 to 55% Dr. Haroldine Laws has approved that she can proceed and finish of the remaining Herceptin treatments.  She has 4 more doses left and will see him 01/16/2020   Tamoxifen toxicities:Manageable hot flashes.  Will continue this daily.  She is overdue for her left breast screening mammogram.  My nurse Merleen Nicely called and got this set up for her today.    She will return every 3 weeks for treatment.  She finishes treatment on 01/17/2020.  I will see her prior to this appointment for her SCP visit.  I have requested scheduling update her appointments.      All questions were answered. The patient knows to call the clinic with any problems, questions or concerns. We can certainly see the patient much sooner if necessary.  Total encounter time: 20 minutes*   Wilber Bihari, NP 12/06/19 11:16 AM Medical Oncology and Hematology [facility] [place], [place] [address] Tel. [phone]    Fax. [phone]  *Total Encounter Time as defined by the Centers for Medicare and Medicaid Services includes, in addition to the face-to-face time of a patient visit (documented in the note above) non-face-to-face time: obtaining and reviewing outside history, ordering and reviewing medications, tests or procedures, care coordination (communications with other health care professionals or caregivers) and documentation in the medical record.

## 2019-12-06 NOTE — Progress Notes (Signed)
Weight based dosing for Ogivri (trastuzumab-dkst) updated in treatment plan to reflect patient's most recent weight of 75.1 kg. Confirmed OK for dose adjustment based on weight with Wilber Bihari, NP.  Leron Croak, PharmD, BCPS PGY2 Hematology/Oncology Pharmacy Resident 12/06/2019 11:33 AM

## 2019-12-06 NOTE — Patient Instructions (Signed)
[facility] Discharge Instructions for Patients Receiving Chemotherapy  Today you received the following immunotherapy agent: Trastuzumab  To help prevent nausea and vomiting after your treatment, we encourage you to take your nausea medication as directed by your MD.   If you develop nausea and vomiting that is not controlled by your nausea medication, call the clinic.   BELOW ARE SYMPTOMS THAT SHOULD BE REPORTED IMMEDIATELY:  *FEVER GREATER THAN 100.5 F  *CHILLS WITH OR WITHOUT FEVER  NAUSEA AND VOMITING THAT IS NOT CONTROLLED WITH YOUR NAUSEA MEDICATION  *UNUSUAL SHORTNESS OF BREATH  *UNUSUAL BRUISING OR BLEEDING  TENDERNESS IN MOUTH AND THROAT WITH OR WITHOUT PRESENCE OF ULCERS  *URINARY PROBLEMS  *BOWEL PROBLEMS  UNUSUAL RASH Items with * indicate a potential emergency and should be followed up as soon as possible.  Feel free to call the clinic should you have any questions or concerns. The clinic phone number is [phone].  Please show the CHEMO ALERT CARD at check-in to the Emergency Department and triage nurse.   

## 2019-12-06 NOTE — Telephone Encounter (Signed)
Cancelled appt and scheduled appts per 6/18 los. Left voicemail with cancellation details as well as new appt info.

## 2019-12-18 ENCOUNTER — Ambulatory Visit
Admission: RE | Admit: 2019-12-18 | Discharge: 2019-12-18 | Disposition: A | Discharge status: Home / Self Care | Payer: BC Managed Care – PPO | Source: Ambulatory Visit | Attending: Adult Health | Admitting: Adult Health

## 2019-12-18 ENCOUNTER — Other Ambulatory Visit: Payer: Self-pay

## 2019-12-18 DIAGNOSIS — Z170 Estrogen receptor positive status [ER+]: Secondary | ICD-10-CM

## 2019-12-19 ENCOUNTER — Ambulatory Visit: Payer: BC Managed Care – PPO | Admitting: Adult Health

## 2019-12-19 ENCOUNTER — Other Ambulatory Visit: Payer: BC Managed Care – PPO

## 2019-12-19 ENCOUNTER — Ambulatory Visit: Payer: BC Managed Care – PPO

## 2019-12-26 NOTE — Progress Notes (Signed)
Patient Care Team: [name], DO as PCP - General (Family Medicine) [name], MD as Consulting Physician (General Surgery) [name], MD as Consulting Physician (Hematology and Oncology) [name], RN as Oncology Nurse Navigator [name], RN as Oncology Nurse Navigator Kyung Rudd, MD as Consulting Physician (Radiation Oncology)  DIAGNOSIS:    ICD-10-CM   1. Malignant neoplasm of upper-inner quadrant of right breast in female, estrogen receptor positive ([place])  C50.211    Z17.0     SUMMARY OF ONCOLOGIC HISTORY: Oncology History  Malignant neoplasm of upper-inner quadrant of right breast in female, estrogen receptor positive ([place])  04/17/2018 Initial Diagnosis   Palpable right breast masses UOQ and UIQ with multiple masses 12 to 14 cm, at 1:00 1.6 cm, 1231.8 cm, and o'clock 4.5 cm and at 9:30 position 5 cm: Biopsy revealed IDC with DCIS, grade 3, ER 80%, PR 0%, HER-2 negative, Ki-67 20%; biopsy of the 4.5 cm mass came back as IDC grade 2-3, ER 50%, PR 5%, HER-2 -2+ ratio 1.6, copy #3.2, Ki-67 20%, T2N1 stage IIb   05/09/2018 - 10/08/2018 Neo-Adjuvant Chemotherapy   Neoadjuvant chemotherapy with dose dense Adriamycin and Cytoxan x4 followed by Taxol weekly x12   06/19/2018 Genetic Testing   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  The Multi-Gene Panel offered by [facility] includes sequencing and/or deletion duplication testing of the following 84 genes: AIP, ALK, APC, ATM, AXIN2,BAP1,  BARD1, BLM, BMPR1A, BRCA1, BRCA2, BRIP1, CASR, CDC73, CDH1, CDK4, CDKN1B, CDKN1C, CDKN2A (p14ARF), CDKN2A (p16INK4a), CEBPA, CHEK2, CTNNA1, DICER1, DIS3L2, EGFR (c.2369C>T, p.Thr790Met variant only), EPCAM (Deletion/duplication testing only), FH, FLCN, GATA2, GPC3, GREM1 (Promoter region deletion/duplication testing only), HOXB13 (c.251G>A, p.Gly84Glu), HRAS, KIT, MAX, MEN1, MET, MITF (c.952G>A, p.Glu318Lys variant only), MLH1, MSH2,  MSH3, MSH6, MUTYH, NBN, NF1, NF2, NTHL1, PALB2, PDGFRA, PHOX2B, PMS2, POLD1, POLE, POT1, PRKAR1A, PTCH1, PTEN, RAD50, RAD51C, RAD51D, RB1, RECQL4, RET, RUNX1, SDHAF2, SDHA (sequence changes only), SDHB, SDHC, SDHD, SMAD4, SMARCA4, SMARCB1, SMARCE1, STK11, SUFU, TERC, TERT, TMEM127, TP53, TSC1, TSC2, VHL, WRN and WT1.  The report date is 06/19/2018.   10/11/2018 Breast MRI   Significant decrease in the size of the right axillary adenopathy and right breast masses from 1.2cm to 0.9cm with minimal residual enhancement, and 2.4x1.4cm to 2.3x1.9cm.   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, largest 1.8 cm, intermediate grade DCIS, margins negative, lymphovascular space invasion present, 0/6 lymph nodes negative, ER 60%, PR 60%, HER-2 positive ratio 2.59   11/20/2018 Cancer Staging   Staging form: Breast, AJCC 8th Edition - Pathologic stage from 11/20/2018: Stage IA (pT1c, pN0, cM0, G2, ER+, PR+, HER2+) - Signed by [name], NP on 12/05/2018   12/14/2018 -  Chemotherapy   The patient had trastuzumab (HERCEPTIN) 546 mg in sodium chloride 0.9 % 250 mL chemo infusion, 8 mg/kg = 546 mg, Intravenous,  Once, 3 of 3 cycles Administration: 546 mg (12/14/2018), 399 mg (01/03/2019), 399 mg (01/24/2019) trastuzumab-dkst (OGIVRI) 399 mg in sodium chloride 0.9 % 250 mL chemo infusion, 6 mg/kg = 399 mg (100 % of original dose 6 mg/kg), Intravenous,  Once, 12 of 14 cycles Dose modification: 6 mg/kg (original dose 6 mg/kg, Cycle 4, Reason: Other (see comments), Comment: Biosimilar Conversion), 6 mg/kg (original dose 6 mg/kg, Cycle 15, Reason: Other (see comments), Comment: weight increase) Administration: 399 mg (02/14/2019), 399 mg (03/07/2019), 399 mg (03/28/2019), 399 mg (04/18/2019), 399 mg (05/09/2019),  399 mg (05/30/2019), 399 mg (06/20/2019), 399 mg (07/11/2019), 399 mg (08/01/2019), 399 mg (10/18/2019), 399 mg (11/07/2019), 450 mg (12/06/2019)  for chemotherapy  treatment.    01/01/2019 - 02/14/2019 Radiation Therapy   Adjuvant XRT to right chest wall   03/2019 -  Anti-estrogen oral therapy   Tamoxifen daily     CHIEF COMPLIANT: Herceptin maintenance  INTERVAL HISTORY: Miranda Ayers is a 34 y.o. with above-mentioned history of right breast cancertreated withneoadjuvant chemotherapy, right mastectomy, radiation,andis currently onadjuvant Herceptin maintenanceand antiestrogen therapy with tamoxifen. Mammogram on 12/18/19 showed no evidence of malignancy in the left breast. She presents to the clinic todayfor treatment.  No major side effects to Herceptin.  With tamoxifen she gets hot flashes but that are manageable.  ALLERGIES:  has No Known Allergies.  MEDICATIONS:  Current Outpatient Medications  Medication Sig Dispense Refill  . carvedilol (COREG) 3.125 MG tablet Take 1 tablet (3.125 mg total) by mouth 2 (two) times daily. 60 tablet 3  . fluocinolone (SYNALAR) 0.025 % cream Apply topically 3 (three) times daily. 120 g 2  . sacubitril-valsartan (ENTRESTO) 49-51 MG Take 1 tablet by mouth 2 (two) times daily. 60 tablet 3  . tamoxifen (NOLVADEX) 20 MG tablet Take 1 tablet (20 mg total) by mouth daily. 90 tablet 3   No current facility-administered medications for this visit.    PHYSICAL EXAMINATION: ECOG PERFORMANCE STATUS: 1 - Symptomatic but completely ambulatory  Vitals:   12/27/19 1129  BP: 140/87  Pulse: 85  Resp: 18  Temp: 98.2 F (36.8 C)  SpO2: 100%   Filed Weights   12/27/19 1129  Weight: 166 lb (75.3 kg)     LABORATORY DATA:  I have reviewed the data as listed CMP Latest Ref Rng & Units 12/27/2019 11/07/2019 07/29/2019  Glucose 70 - 99 mg/dL 83 86 84  BUN 6 - 20 mg/dL 10 9 10   Creatinine 0.44 - 1.00 mg/dL 0.82 0.75 0.78  Sodium 135 - 145 mmol/L 139 140 144  Potassium 3.5 - 5.1 mmol/L 3.3(L) 3.9 3.9  Chloride 98 - 111 mmol/L 109 108 108  CO2 22 - 32 mmol/L 22 24 29   Calcium 8.9 - 10.3 mg/dL 9.1 8.9 9.2  Total  Protein 6.5 - 8.1 g/dL 7.6 7.1 7.3  Total Bilirubin 0.3 - 1.2 mg/dL 0.4 0.3 0.3  Alkaline Phos 38 - 126 U/L 45 51 60  AST 15 - 41 U/L 14(L) 16 15  ALT 0 - 44 U/L 11 12 14     Lab Results  Component Value Date   WBC 4.9 12/27/2019   HGB 13.0 12/27/2019   HCT 39.8 12/27/2019   MCV 89.6 12/27/2019   PLT 183 12/27/2019   NEUTROABS 2.5 12/27/2019    ASSESSMENT & PLAN:  Malignant neoplasm of upper-inner quadrant of right breast in female, estrogen receptor positive ([place]) 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%, PR 0%, HER-2 negative, Ki-67 20%; biopsy of the 4.5 cm mass came back as IDC grade 2-3, ER 50%, PR 5%, HER-2 -2+ ratio 1.6, copy #3.2, Ki-67 20%, T2N1 stage IIb  Recommendation: 1.Neoadjuvant chemotherapy with dose dense Adriamycinand Cytoxan x4 followed by Taxol weekly x11 05/09/2018-10/02/2018 2.11/20/2018:Rightmastectomy and axillary lymph node dissection 3.Since the final path was HER-2 positive she will get 1 year of Herceptinwhich will be completed in October 2021  4.Adjuvantradiation7/14/2020-02/14/2019 5.Followed by adjuvant antiestrogen therapypatient prefers tamoxifen starting 03/28/2019 ----------------------------------------------------------- 11/20/2018:Right mastectomy: 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 Haroldine Laws has approved that she can proceed and finish of the  remaining Herceptin treatments.  Tamoxifen toxicities:Manageable hot flashes.    Breast cancer surveillance: Left mammogram 12/18/2019: Benign breast density category B, prior right mastectomy  Return to clinic in 6 months for surveillance checkup    No orders of the defined types were placed in this encounter.  The patient has a good understanding of the overall plan. she agrees with it. she will call with any problems that may develop before the next visit here.  Total time spent: 30 mins including face to face time and time spent for planning, charting and coordination of care  [name], MD 12/27/2019  I, Cloyde Reams Dorshimer, am acting as scribe for Dr. [name].  I have reviewed the above documentation for accuracy and completeness, and I agree with the above.

## 2019-12-27 ENCOUNTER — Inpatient Hospital Stay: Payer: BC Managed Care – PPO | Admitting: Hematology and Oncology

## 2019-12-27 ENCOUNTER — Inpatient Hospital Stay: Payer: BC Managed Care – PPO

## 2019-12-27 ENCOUNTER — Other Ambulatory Visit: Payer: Self-pay

## 2019-12-27 ENCOUNTER — Inpatient Hospital Stay: Payer: BC Managed Care – PPO | Attending: Adult Health

## 2019-12-27 DIAGNOSIS — C50211 Malignant neoplasm of upper-inner quadrant of right female breast: Secondary | ICD-10-CM

## 2019-12-27 DIAGNOSIS — Z5112 Encounter for antineoplastic immunotherapy: Secondary | ICD-10-CM | POA: Insufficient documentation

## 2019-12-27 DIAGNOSIS — Z170 Estrogen receptor positive status [ER+]: Secondary | ICD-10-CM

## 2019-12-27 LAB — CMP (CANCER CENTER ONLY)
ALT: 11 U/L (ref 0–44)
AST: 14 U/L — ABNORMAL LOW (ref 15–41)
Albumin: 3.7 g/dL (ref 3.5–5.0)
Alkaline Phosphatase: 45 U/L (ref 38–126)
Anion gap: 8 (ref 5–15)
BUN: 10 mg/dL (ref 6–20)
CO2: 22 mmol/L (ref 22–32)
Calcium: 9.1 mg/dL (ref 8.9–10.3)
Chloride: 109 mmol/L (ref 98–111)
Creatinine: 0.82 mg/dL (ref 0.44–1.00)
GFR, Est AFR Am: >60 mL/min (ref >60)
GFR, Estimated: >60 mL/min (ref >60)
Glucose, Bld: 83 mg/dL (ref 70–99)
Potassium: 3.3 mmol/L — ABNORMAL LOW (ref 3.5–5.1)
Sodium: 139 mmol/L (ref 135–145)
Total Bilirubin: 0.4 mg/dL (ref 0.3–1.2)
Total Protein: 7.6 g/dL (ref 6.5–8.1)

## 2019-12-27 LAB — CBC WITH DIFFERENTIAL (CANCER CENTER ONLY)
Abs Immature Granulocytes: 0.02 10*3/uL (ref 0.00–0.07)
Basophils Absolute: 0 10*3/uL (ref 0.0–0.1)
Basophils Relative: 0 %
Eosinophils Absolute: 0.1 10*3/uL (ref 0.0–0.5)
Eosinophils Relative: 1 %
HCT: 39.8 % (ref 36.0–46.0)
Hemoglobin: 13 g/dL (ref 12.0–15.0)
Immature Granulocytes: 0 %
Lymphocytes Relative: 37 %
Lymphs Abs: 1.8 10*3/uL (ref 0.7–4.0)
MCH: 29.3 pg (ref 26.0–34.0)
MCHC: 32.7 g/dL (ref 30.0–36.0)
MCV: 89.6 fL (ref 80.0–100.0)
Monocytes Absolute: 0.5 10*3/uL (ref 0.1–1.0)
Monocytes Relative: 11 %
Neutro Abs: 2.5 10*3/uL (ref 1.7–7.7)
Neutrophils Relative %: 51 %
Platelet Count: 183 10*3/uL (ref 150–400)
RBC: 4.44 MIL/uL (ref 3.87–5.11)
RDW: 13.2 % (ref 11.5–15.5)
WBC Count: 4.9 10*3/uL (ref 4.0–10.5)
nRBC: 0 % (ref 0.0–0.2)

## 2019-12-27 MED ORDER — DIPHENHYDRAMINE HCL 25 MG PO CAPS
ORAL_CAPSULE | ORAL | Status: AC
  Filled 2019-12-27: qty 2

## 2019-12-27 MED ORDER — SODIUM CHLORIDE 0.9% FLUSH
10.0000 mL | INTRAVENOUS | Status: DC | PRN
  Filled 2019-12-27: qty 10

## 2019-12-27 MED ORDER — ACETAMINOPHEN 325 MG PO TABS
650.0000 mg | ORAL_TABLET | Freq: Once | ORAL | Status: AC
  Administered 2019-12-27: 650 mg via ORAL

## 2019-12-27 MED ORDER — TRASTUZUMAB-DKST CHEMO 150 MG IV SOLR
450.0000 mg | Freq: Once | INTRAVENOUS | Status: AC
  Administered 2019-12-27: 450 mg via INTRAVENOUS
  Filled 2019-12-27: qty 21.43

## 2019-12-27 MED ORDER — ACETAMINOPHEN 325 MG PO TABS
ORAL_TABLET | ORAL | Status: AC
  Filled 2019-12-27: qty 2

## 2019-12-27 MED ORDER — DIPHENHYDRAMINE HCL 25 MG PO CAPS
50.0000 mg | ORAL_CAPSULE | Freq: Once | ORAL | Status: AC
  Administered 2019-12-27: 50 mg via ORAL

## 2019-12-27 MED ORDER — SODIUM CHLORIDE 0.9 % IV SOLN
Freq: Once | INTRAVENOUS | Status: AC
  Filled 2019-12-27: qty 250

## 2019-12-27 MED ORDER — HEPARIN SOD (PORK) LOCK FLUSH 100 UNIT/ML IV SOLN
500.0000 [IU] | Freq: Once | INTRAVENOUS | Status: DC | PRN
  Filled 2019-12-27: qty 5

## 2019-12-27 NOTE — Patient Instructions (Signed)
[facility] Discharge Instructions for Patients Receiving Chemotherapy  Today you received the following immunotherapy agent: Trastuzumab  To help prevent nausea and vomiting after your treatment, we encourage you to take your nausea medication as directed by your MD.   If you develop nausea and vomiting that is not controlled by your nausea medication, call the clinic.   BELOW ARE SYMPTOMS THAT SHOULD BE REPORTED IMMEDIATELY:  *FEVER GREATER THAN 100.5 F  *CHILLS WITH OR WITHOUT FEVER  NAUSEA AND VOMITING THAT IS NOT CONTROLLED WITH YOUR NAUSEA MEDICATION  *UNUSUAL SHORTNESS OF BREATH  *UNUSUAL BRUISING OR BLEEDING  TENDERNESS IN MOUTH AND THROAT WITH OR WITHOUT PRESENCE OF ULCERS  *URINARY PROBLEMS  *BOWEL PROBLEMS  UNUSUAL RASH Items with * indicate a potential emergency and should be followed up as soon as possible.  Feel free to call the clinic should you have any questions or concerns. The clinic phone number is [phone].  Please show the CHEMO ALERT CARD at check-in to the Emergency Department and triage nurse.   

## 2019-12-27 NOTE — Assessment & Plan Note (Signed)
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%, PR 0%, HER-2 negative, Ki-67 20%; biopsy of the 4.5 cm mass came back as IDC grade 2-3, ER 50%, PR 5%, HER-2 -2+ ratio 1.6, copy #3.2, Ki-67 20%, T2N1 stage IIb  Recommendation: 1.Neoadjuvant chemotherapy with dose dense Adriamycinand Cytoxan x4 followed by Taxol weekly x11 05/09/2018-10/02/2018 2.11/20/2018:Rightmastectomy and axillary lymph node dissection 3.Since the final path was HER-2 positive she will get 1 year of Herceptinwhich will be completed in October 2021 4.Adjuvantradiation7/14/2020-02/14/2019 5.Followed by adjuvant antiestrogen therapypatient prefers tamoxifen starting 03/28/2019 ----------------------------------------------------------- 11/20/2018:Right mastectomy: 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 Haroldine Laws has approved that she can proceed and finish of the remaining Herceptin treatments.  Tamoxifen toxicities:Manageable hot flashes.  Will continue this daily.  Breast cancer surveillance: Left mammogram 12/18/2019: Benign breast density category B, prior right mastectomy

## 2019-12-30 ENCOUNTER — Encounter: Payer: Self-pay | Admitting: Hematology and Oncology

## 2019-12-31 ENCOUNTER — Telehealth: Payer: Self-pay | Admitting: Hematology and Oncology

## 2019-12-31 NOTE — Telephone Encounter (Signed)
Scheduled per 7/13 los. Called and left a msg, mailing appt letter and calendar printout

## 2020-01-09 ENCOUNTER — Ambulatory Visit: Payer: BC Managed Care – PPO

## 2020-01-15 ENCOUNTER — Encounter: Payer: Self-pay | Admitting: Hematology and Oncology

## 2020-01-16 ENCOUNTER — Ambulatory Visit (HOSPITAL_COMMUNITY)
Admission: RE | Admit: 2020-01-16 | Discharge: 2020-01-16 | Disposition: A | Discharge status: Home / Self Care | Payer: BC Managed Care – PPO | Source: Ambulatory Visit | Attending: Internal Medicine | Admitting: Internal Medicine

## 2020-01-16 ENCOUNTER — Ambulatory Visit (HOSPITAL_BASED_OUTPATIENT_CLINIC_OR_DEPARTMENT_OTHER)
Admission: RE | Admit: 2020-01-16 | Discharge: 2020-01-16 | Disposition: A | Discharge status: Home / Self Care | Payer: BC Managed Care – PPO | Source: Ambulatory Visit | Attending: Internal Medicine | Admitting: Internal Medicine

## 2020-01-16 ENCOUNTER — Other Ambulatory Visit: Payer: Self-pay

## 2020-01-16 VITALS — BP 115/85 | HR 82 | Wt 166.2 lb

## 2020-01-16 DIAGNOSIS — I427 Cardiomyopathy due to drug and external agent: Secondary | ICD-10-CM | POA: Diagnosis not present

## 2020-01-16 DIAGNOSIS — Z79899 Other long term (current) drug therapy: Secondary | ICD-10-CM | POA: Diagnosis not present

## 2020-01-16 DIAGNOSIS — R002 Palpitations: Secondary | ICD-10-CM | POA: Diagnosis not present

## 2020-01-16 DIAGNOSIS — I110 Hypertensive heart disease with heart failure: Secondary | ICD-10-CM | POA: Insufficient documentation

## 2020-01-16 DIAGNOSIS — I10 Essential (primary) hypertension: Secondary | ICD-10-CM

## 2020-01-16 DIAGNOSIS — Z0189 Encounter for other specified special examinations: Secondary | ICD-10-CM

## 2020-01-16 DIAGNOSIS — T451X5A Adverse effect of antineoplastic and immunosuppressive drugs, initial encounter: Secondary | ICD-10-CM | POA: Insufficient documentation

## 2020-01-16 DIAGNOSIS — Z170 Estrogen receptor positive status [ER+]: Secondary | ICD-10-CM | POA: Insufficient documentation

## 2020-01-16 DIAGNOSIS — C50211 Malignant neoplasm of upper-inner quadrant of right female breast: Secondary | ICD-10-CM | POA: Diagnosis not present

## 2020-01-16 DIAGNOSIS — Z923 Personal history of irradiation: Secondary | ICD-10-CM | POA: Diagnosis not present

## 2020-01-16 DIAGNOSIS — I5020 Unspecified systolic (congestive) heart failure: Secondary | ICD-10-CM | POA: Diagnosis not present

## 2020-01-16 DIAGNOSIS — E669 Obesity, unspecified: Secondary | ICD-10-CM | POA: Insufficient documentation

## 2020-01-16 LAB — ECHOCARDIOGRAM COMPLETE
AR max vel: 1.46 cm2
AV Area VTI: 1.58 cm2
AV Area mean vel: 1.48 cm2
AV Mean grad: 2 mmHg
AV Peak grad: 4.2 mmHg
Ao pk vel: 1.03 m/s
Area-P 1/2: 5.97 cm2
S' Lateral: 3.9 cm

## 2020-01-16 NOTE — Progress Notes (Signed)
  Echocardiogram 2D Echocardiogram has been performed.  Miranda Ayers 01/16/2020, 10:56 AM

## 2020-01-16 NOTE — Patient Instructions (Signed)
Your physician recommends that you schedule a follow-up appointment in: 3 months with echocardiogram  

## 2020-01-16 NOTE — Addendum Note (Signed)
Encounter addended by: Scarlette Calico, RN on: 01/16/2020 11:50 AM  Actions taken: Clinical Note Signed

## 2020-01-16 NOTE — Progress Notes (Signed)
CARDIO-ONCOLOGY CLINIC NOTE  Referring Physician: Primary Care: Primary Cardiologist:  HPI:  Ms Tarbell is 34 y.o. female with h/o obesity, HTN and breast cancer referred by Dr. Lindi Adie for enrollment into the Cardio-Oncology program due to reduced EF  SUMMARY OF ONCOLOGIC HISTORY:    Oncology History  Malignant neoplasm of upper-inner quadrant of right breast in female, estrogen receptor positive ([place])  04/17/2018 Initial Diagnosis   Palpable right breast masses UOQ and UIQ with multiple masses 12 to 14 cm, at 1:00 1.6 cm, 1231.8 cm, and o'clock 4.5 cm and at 9:30 position 5 cm: Biopsy revealed IDC with DCIS, grade 3, ER 80%, PR 0%, HER-2 negative, Ki-67 20%; biopsy of the 4.5 cm mass came back as IDC grade 2-3, ER 50%, PR 5%, HER-2 -2+ ratio 1.6, copy #3.2, Ki-67 20%, T2N1 stage IIb   05/09/2018 - 10/08/2018 Neo-Adjuvant Chemotherapy   Neoadjuvant chemotherapy with dose dense Adriamycinand Cytoxan x4 followed by Taxol weekly x12   06/19/2018 Genetic Testing   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 The Multi-Gene Panel offered by [facility] includes sequencing and/or deletion duplication testing of the following 84 genes: AIP, ALK, APC, ATM, AXIN2,BAP1, BARD1, BLM, BMPR1A, BRCA1, BRCA2, BRIP1, CASR, CDC73, CDH1, CDK4, CDKN1B, CDKN1C, CDKN2A (p14ARF), CDKN2A (p16INK4a), CEBPA, CHEK2, CTNNA1, DICER1, DIS3L2, EGFR (c.2369C>T, p.Thr790Met variant only), EPCAM (Deletion/duplication testing only), FH, FLCN, GATA2, GPC3, GREM1 (Promoter region deletion/duplication testing only), HOXB13 (c.251G>A, p.Gly84Glu), HRAS, KIT, MAX, MEN1, MET, MITF (c.952G>A, p.Glu318Lys variant only), MLH1, MSH2, MSH3, MSH6, MUTYH, NBN, NF1, NF2, NTHL1, PALB2, PDGFRA, PHOX2B, PMS2, POLD1, POLE, POT1, PRKAR1A, PTCH1, PTEN, RAD50, RAD51C, RAD51D, RB1, RECQL4, RET, RUNX1, SDHAF2, SDHA (sequence changes only), SDHB, SDHC, SDHD, SMAD4, SMARCA4, SMARCB1, SMARCE1, STK11, SUFU, TERC,  TERT, TMEM127, TP53, TSC1, TSC2, VHL, WRN and WT1. The report date is 06/19/2018.   10/11/2018 Breast MRI   Significant decrease in the size of the right axillary adenopathy and right breast masses from 1.2cm to 0.9cm with minimal residual enhancement, and 2.4x1.4cm to 2.3x1.9cm.   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, largest 1.8 cm, intermediate grade DCIS, margins negative, lymphovascular space invasion present, 0/6 lymph nodes negative, ER 60%, PR 60%, HER-2 positive ratio 2.59   11/20/2018 Cancer Staging   Staging form: Breast, AJCC 8th Edition - Pathologic stage from 11/20/2018: Stage IA (pT1c, pN0, cM0, G2, ER+, PR+, HER2+) - Signed by [name], NP on 12/05/2018   12/14/2018 -  Chemotherapy   The patient had trastuzumab (HERCEPTIN) 546 mg in sodium chloride 0.9 % 250 mL chemo infusion, 8 mg/kg = 546 mg, Intravenous, Once, 3 of 3 cycles Administration: 546 mg (12/14/2018), 399 mg (01/03/2019), 399 mg (01/24/2019) trastuzumab-dkst (OGIVRI) 399 mg in sodium chloride 0.9 % 250 mL chemo infusion, 6 mg/kg = 399 mg (100 % of original dose 6 mg/kg), Intravenous, Once, 8 of 14 cycles Dose modification: 6 mg/kg (original dose 6 mg/kg, Cycle 4, Reason: Other (see comments), Comment: Biosimilar Conversion) Administration: 399 mg (02/14/2019), 399 mg (03/07/2019), 399 mg (03/28/2019), 399 mg (04/18/2019), 399 mg (05/09/2019), 399 mg (05/30/2019), 399 mg (06/20/2019), 399 mg (07/11/2019) for chemotherapy treatment.    01/01/2019 - 02/14/2019 Radiation Therapy    She denies any h/o of known heart disease. Has had uncontrolled HTN. Found to have R breast CA in 11/19. Treated with adriamycin based regimen. Did fine. Post adriamycin echo was normal. Has mastectomy and found to have residual tumor in  R breast which was HER-2+ . Started on Herceptin in 6/20. Echo 10/20 EF 55-60%.  Echo 08/05/19 showwed EF 40-45%.  - adriamycin completed  4/20  - herceptin started 12/14/18  - herceptin held 3/21 - herceptin restarted in 4/21  Feels good. No SOB, palpitations or edema. Tolerating herceptin well. Tomorrow is last dose!!!  Echo today 01/16/20 EF 50-55% Personally reviewed  Echo 4/21 EF 50-55% GLS - 17.2%  Echo 6/20 EF 55-60% FLS -14.5% Echo 10/20 EF 55-60% GLS -18 Echo 08/05/19 EF 40-45% GLS -15   Past Medical History:  Diagnosis Date  . Cancer [facility])    breast cancer  . Family history of colon cancer   . Family history of lung cancer   . Family history of pancreatic cancer   . Hypertension     Current Outpatient Medications  Medication Sig Dispense Refill  . carvedilol (COREG) 3.125 MG tablet Take 1 tablet (3.125 mg total) by mouth 2 (two) times daily. 60 tablet 3  . fluocinolone (SYNALAR) 0.025 % cream Apply topically 3 (three) times daily. 120 g 2  . sacubitril-valsartan (ENTRESTO) 49-51 MG Take 1 tablet by mouth 2 (two) times daily. 60 tablet 3  . tamoxifen (NOLVADEX) 20 MG tablet Take 1 tablet (20 mg total) by mouth daily. 90 tablet 3   No current facility-administered medications for this encounter.    No Known Allergies    Social History   Socioeconomic History  . Marital status: Single    Spouse name: Not on file  . Number of children: Not on file  . Years of education: Not on file  . Highest education level: Not on file  Occupational History  . Not on file  Tobacco Use  . Smoking status: Never Smoker  . Smokeless tobacco: Never Used  Vaping Use  . Vaping Use: Never used  Substance and Sexual Activity  . Alcohol use: Never  . Drug use: Never  . Sexual activity: Not Currently    Birth control/protection: None  Other Topics Concern  . Not on file  Social History Narrative  . Not on file   Social Determinants of Health   Financial Resource Strain:   . Difficulty of Paying Living Expenses:   Food Insecurity:   . Worried About Charity fundraiser in the Last Year:   . Arboriculturist  in the Last Year:   Transportation Needs:   . Film/video editor (Medical):   Marland Kitchen Lack of Transportation (Non-Medical):   Physical Activity:   . Days of Exercise per Week:   . Minutes of Exercise per Session:   Stress:   . Feeling of Stress :   Social Connections:   . Frequency of Communication with Friends and Family:   . Frequency of Social Gatherings with Friends and Family:   . Attends Religious Services:   . Active Member of Clubs or Organizations:   . Attends Archivist Meetings:   Marland Kitchen Marital Status:   Intimate Partner Violence:   . Fear of Current or Ex-Partner:   . Emotionally Abused:   Marland Kitchen Physically Abused:   . Sexually Abused:       Family History  Problem Relation Age of Onset  . Lung cancer Maternal Grandmother   . Lung cancer Maternal Grandfather   . Colon cancer Maternal Grandfather   . Stomach cancer Paternal Grandfather   . Lung cancer Maternal Aunt   . Pancreatic cancer Maternal Aunt   . Lung cancer Maternal Uncle   .  Cancer Cousin        childhood cancer in mat first cousin    Vitals:   01/16/20 1110  BP: 115/85  Pulse: 82  SpO2: 96%  Weight: 75.4 kg (166 lb 3.2 oz)    PHYSICAL EXAM: General:  Well appearing. No resp difficulty HEENT: normal Neck: supple. no JVD. Carotids 2+ bilat; no bruits. No lymphadenopathy or thryomegaly appreciated. Cor: PMI nondisplaced. Regular rate & rhythm. No rubs, gallops or murmurs. Lungs: clear Abdomen: soft, nontender, nondistended. No hepatosplenomegaly. No bruits or masses. Good bowel sounds. Extremities: no cyanosis, clubbing, rash, edema Neuro: alert & orientedx3, cranial nerves grossly intact. moves all 4 extremities w/o difficulty. 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 Uncontrolled HTN may also be playing a role - Herceptin held in  March 2021. Restarted 4/21 - EF today stable at 50-55%. I suspect she has mild Herceptin toxicity. (vs HTN related). Will have her finish Herceptin tomorrow and repeat echo in 3 months. Will have her check BP at home every week or so if SBP > 160 at any time call.  - Continue carvedilol 3.125 bid - Continue Entresto 49/51 bid  2. HTN - Blood pressure well controlled. Continue current regimen.  3. Breast Cancer - as above  4. Palpitations - resolved  Glori Bickers, MD  11:42 AM

## 2020-01-16 NOTE — Addendum Note (Signed)
Encounter addended by: Scarlette Calico, RN on: 01/16/2020 11:55 AM  Actions taken: Order list changed, Diagnosis association updated

## 2020-01-16 NOTE — Progress Notes (Signed)
[facility] Follow up:    Miranda Ayers, [place] [address]   DIAGNOSIS: Cancer Staging Malignant neoplasm of upper-inner quadrant of right breast in female, estrogen receptor positive ([place]) Staging form: Breast, AJCC 8th Edition - Clinical: Stage IIIA (cT2, cN1, cM0, G3, ER+, PR-, HER2-) - Unsigned - Pathologic stage from 11/20/2018: Stage IA (pT1c, pN0, cM0, G2, ER+, PR+, HER2+) - Signed by [name], NP on 12/05/2018   SUMMARY OF ONCOLOGIC HISTORY: Oncology History  Malignant neoplasm of upper-inner quadrant of right breast in female, estrogen receptor positive ([place])  04/17/2018 Initial Diagnosis   Palpable right breast masses UOQ and UIQ with multiple masses 12 to 14 cm, at 1:00 1.6 cm, 1231.8 cm, and o'clock 4.5 cm and at 9:30 position 5 cm: Biopsy revealed IDC with DCIS, grade 3, ER 80%, PR 0%, HER-2 negative, Ki-67 20%; biopsy of the 4.5 cm mass came back as IDC grade 2-3, ER 50%, PR 5%, HER-2 -2+ ratio 1.6, copy #3.2, Ki-67 20%, T2N1 stage IIb   05/09/2018 - 10/08/2018 Neo-Adjuvant Chemotherapy   Neoadjuvant chemotherapy with dose dense Adriamycin and Cytoxan x4 followed by Taxol weekly x12   06/19/2018 Genetic Testing   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  The Multi-Gene Panel offered by [facility] includes sequencing and/or deletion duplication testing of the following 84 genes: AIP, ALK, APC, ATM, AXIN2,BAP1,  BARD1, BLM, BMPR1A, BRCA1, BRCA2, BRIP1, CASR, CDC73, CDH1, CDK4, CDKN1B, CDKN1C, CDKN2A (p14ARF), CDKN2A (p16INK4a), CEBPA, CHEK2, CTNNA1, DICER1, DIS3L2, EGFR (c.2369C>T, p.Thr790Met variant only), EPCAM (Deletion/duplication testing only), FH, FLCN, GATA2, GPC3, GREM1 (Promoter region deletion/duplication testing only), HOXB13 (c.251G>A, p.Gly84Glu), HRAS, KIT, MAX, MEN1, MET, MITF (c.952G>A, p.Glu318Lys variant only), MLH1, MSH2, MSH3, MSH6, MUTYH, NBN, NF1, NF2,  NTHL1, PALB2, PDGFRA, PHOX2B, PMS2, POLD1, POLE, POT1, PRKAR1A, PTCH1, PTEN, RAD50, RAD51C, RAD51D, RB1, RECQL4, RET, RUNX1, SDHAF2, SDHA (sequence changes only), SDHB, SDHC, SDHD, SMAD4, SMARCA4, SMARCB1, SMARCE1, STK11, SUFU, TERC, TERT, TMEM127, TP53, TSC1, TSC2, VHL, WRN and WT1.  The report date is 06/19/2018.   10/11/2018 Breast MRI   Significant decrease in the size of the right axillary adenopathy and right breast masses from 1.2cm to 0.9cm with minimal residual enhancement, and 2.4x1.4cm to 2.3x1.9cm.   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, largest 1.8 cm, intermediate grade DCIS, margins negative, lymphovascular space invasion present, 0/6 lymph nodes negative, ER 60%, PR 60%, HER-2 positive ratio 2.59   11/20/2018 Cancer Staging   Staging form: Breast, AJCC 8th Edition - Pathologic stage from 11/20/2018: Stage IA (pT1c, pN0, cM0, G2, ER+, PR+, HER2+) - Signed by [name], NP on 12/05/2018   12/14/2018 -  Chemotherapy   The patient had trastuzumab (HERCEPTIN) 546 mg in sodium chloride 0.9 % 250 mL chemo infusion, 8 mg/kg = 546 mg, Intravenous,  Once, 3 of 3 cycles Administration: 546 mg (12/14/2018), 399 mg (01/03/2019), 399 mg (01/24/2019) trastuzumab-dkst (OGIVRI) 399 mg in sodium chloride 0.9 % 250 mL chemo infusion, 6 mg/kg = 399 mg (100 % of original dose 6 mg/kg), Intravenous,  Once, 14 of 14 cycles Dose modification: 6 mg/kg (original dose 6 mg/kg, Cycle 4, Reason: Other (see comments), Comment: Biosimilar Conversion), 6 mg/kg (original dose 6 mg/kg, Cycle 15, Reason: Other (see comments), Comment: weight increase) Administration: 399 mg (02/14/2019), 399 mg (03/07/2019), 399 mg (03/28/2019), 399 mg (04/18/2019), 399 mg (05/09/2019), 399 mg (05/30/2019), 399  mg (06/20/2019), 399 mg (07/11/2019), 399 mg (08/01/2019), 399 mg (10/18/2019), 399 mg (11/07/2019), 450 mg (12/27/2019), 450 mg (12/06/2019)  for chemotherapy treatment.     01/01/2019 - 02/14/2019 Radiation Therapy   Adjuvant XRT to right chest wall   03/2019 -  Anti-estrogen oral therapy   Tamoxifen daily     CURRENT THERAPY: Tamoxifen daily and completing Herceptin  INTERVAL HISTORY: Miranda Ayers 34 y.o. female returns for follow up of her breast cancer.  She completes Herceptin today.  Her echo yesterday showed an EF of 50-55%.  She will undergo repeat echo and f/u with Dr. Haroldine Laws in 3 months.  She was cleared to receive today's final dose of treatment.  She is taking Tamoxifen daily and has mild hot flashes.  Her menstrual cycle has resumed.  This started 01/03/2020.  She said it was heavier than usual, and lasted 5 days.    Miranda Ayers is walking on a treadmill that she walks on at her home.  She also is working with a hula-hoop.  She is trying to lose some weight.     Patient Active Problem List   Diagnosis Date Noted  . Neutropenia, drug-induced ([place]) 08/20/2018  . Genetic testing 06/22/2018  . Family history of lung cancer   . Family history of colon cancer   . Family history of pancreatic cancer   . Malignant neoplasm of upper-inner quadrant of right breast in female, estrogen receptor positive ([place]) 04/19/2018    has No Known Allergies.  MEDICAL HISTORY: Past Medical History:  Diagnosis Date  . Cancer [facility]    breast cancer  . Family history of colon cancer   . Family history of lung cancer   . Family history of pancreatic cancer   . Hypertension     SURGICAL HISTORY: Past Surgical History:  Procedure Laterality Date  . MASTECTOMY WITH RADIOACTIVE SEED GUIDED EXCISION AND AXILLARY SENTINEL LYMPH NODE BIOPSY Right 11/20/2018   Procedure: RIGHT SIMPLE MASTECTOMY WITH RIGHT RADIOACTIVE SEED TARGETED LYMPH NODE BIOPSY AND RIGHT  AXILLARY SENTINEL LYMPH NODE MAPPING AND PORT REMOVEL RIGHT CHEST;  Surgeon: [name], MD;  Location: [facility];  Service: General;  Laterality: Right;  . OOPHORECTOMY  2013  . PORTACATH  PLACEMENT N/A 05/08/2018   Procedure: INSERTION PORT-A-CATH WITH ULTRASOUND;  Surgeon: [name], MD;  Location: [facility];  Service: General;  Laterality: N/A;    SOCIAL HISTORY: Social History   Socioeconomic History  . Marital status: Single    Spouse name: Not on file  . Number of children: Not on file  . Years of education: Not on file  . Highest education level: Not on file  Occupational History  . Not on file  Tobacco Use  . Smoking status: Never Smoker  . Smokeless tobacco: Never Used  Vaping Use  . Vaping Use: Never used  Substance and Sexual Activity  . Alcohol use: Never  . Drug use: Never  . Sexual activity: Not Currently    Birth control/protection: None  Other Topics Concern  . Not on file  Social History Narrative  . Not on file   Social Determinants of Health   Financial Resource Strain:   . Difficulty of Paying Living Expenses:   Food Insecurity:   . Worried About Charity fundraiser in the Last Year:   . Arboriculturist in the Last Year:   Transportation Needs:   . Film/video editor (Medical):   Marland Kitchen Lack of Transportation (Non-Medical):  Physical Activity:   . Days of Exercise per Week:   . Minutes of Exercise per Session:   Stress:   . Feeling of Stress :   Social Connections:   . Frequency of Communication with Friends and Family:   . Frequency of Social Gatherings with Friends and Family:   . Attends Religious Services:   . Active Member of Clubs or Organizations:   . Attends Archivist Meetings:   Marland Kitchen Marital Status:   Intimate Partner Violence:   . Fear of Current or Ex-Partner:   . Emotionally Abused:   Marland Kitchen Physically Abused:   . Sexually Abused:     FAMILY HISTORY: Family History  Problem Relation Age of Onset  . Lung cancer Maternal Grandmother   . Lung cancer Maternal Grandfather   . Colon cancer Maternal Grandfather   . Stomach cancer Paternal Grandfather   . Lung cancer Maternal Aunt   .  Pancreatic cancer Maternal Aunt   . Lung cancer Maternal Uncle   . Cancer Cousin        childhood cancer in mat first cousin    Review of Systems  Constitutional: Negative for appetite change, chills, fatigue, fever and unexpected weight change.  HENT:   Negative for hearing loss and lump/mass.   Eyes: Negative for eye problems and icterus.  Respiratory: Negative for chest tightness, cough and shortness of breath.   Cardiovascular: Negative for chest pain, leg swelling and palpitations.  Gastrointestinal: Negative for abdominal distention, abdominal pain, constipation, diarrhea, nausea and vomiting.  Endocrine: Positive for hot flashes.  Genitourinary: Negative for difficulty urinating.   Musculoskeletal: Negative for arthralgias.  Skin: Negative for itching and rash.  Neurological: Negative for dizziness, extremity weakness, headaches and numbness.  Hematological: Negative for adenopathy. Does not bruise/bleed easily.  Psychiatric/Behavioral: Negative for depression. The patient is not nervous/anxious.       PHYSICAL EXAMINATION  ECOG PERFORMANCE STATUS: 1 - Symptomatic but completely ambulatory  Vitals:   01/17/20 0949  BP: (!) 133/99  Pulse: 75  Resp: 18  Temp: 98.3 F (36.8 C)  SpO2: 100%    Physical Exam Constitutional:      General: She is not in acute distress.    Appearance: Normal appearance. She is not toxic-appearing.  HENT:     Head: Normocephalic and atraumatic.  Eyes:     General: No scleral icterus. Cardiovascular:     Rate and Rhythm: Normal rate and regular rhythm.     Pulses: Normal pulses.     Heart sounds: Normal heart sounds.  Pulmonary:     Effort: Pulmonary effort is normal.  Abdominal:     General: Abdomen is flat. Bowel sounds are normal. There is no distension.     Palpations: Abdomen is soft. There is no mass.     Tenderness: There is no abdominal tenderness.  Musculoskeletal:     Cervical back: Neck supple.  Skin:    General: Skin  is warm and dry.     Capillary Refill: Capillary refill takes less than 2 seconds.     Findings: No rash.  Neurological:     General: No focal deficit present.     Mental Status: She is alert.  Psychiatric:        Mood and Affect: Mood normal.        Behavior: Behavior normal.     LABORATORY DATA:  CBC    Component Value Date/Time   WBC 3.1 (L) 01/17/2020 0917   WBC 2.7 (L)  11/16/2018 1507   RBC 4.51 01/17/2020 0917   HGB 13.0 01/17/2020 0917   HCT 40.4 01/17/2020 0917   PLT 159 01/17/2020 0917   MCV 89.6 01/17/2020 0917   MCH 28.8 01/17/2020 0917   MCHC 32.2 01/17/2020 0917   RDW 13.2 01/17/2020 0917   LYMPHSABS 1.5 01/17/2020 0917   MONOABS 0.3 01/17/2020 0917   EOSABS 0.1 01/17/2020 0917   BASOSABS 0.0 01/17/2020 0917    CMP     Component Value Date/Time   NA 140 01/17/2020 0917   K 4.0 01/17/2020 0917   CL 109 01/17/2020 0917   CO2 22 01/17/2020 0917   GLUCOSE 86 01/17/2020 0917   BUN 11 01/17/2020 0917   CREATININE 0.78 01/17/2020 0917   CALCIUM 9.6 01/17/2020 0917   PROT 7.3 01/17/2020 0917   ALBUMIN 3.7 01/17/2020 0917   AST 14 (L) 01/17/2020 0917   ALT 12 01/17/2020 0917   ALKPHOS 48 01/17/2020 0917   BILITOT 0.3 01/17/2020 0917   GFRNONAA >60 01/17/2020 0917   GFRAA >60 01/17/2020 0917          RADIOGRAPHIC STUDIES:  ECHOCARDIOGRAM COMPLETE  Result Date: 01/16/2020    ECHOCARDIOGRAM REPORT   Patient Name:   Chinita Greenland Date of Exam: 01/16/2020 Medical Rec #:  [id number]       Height:       61.0 in Accession #:    [id number]      Weight:       166.0 lb Date of Birth:  [birth year]       BSA:          1.745 m Patient Age:    34 years        BP:           138/93 mmHg Patient Gender: F               HR:           82 bpm. Exam Location:  Outpatient Procedure: 2D Echo, Color Doppler and Cardiac Doppler Indications:    Congestive heart failure                 Chemo  History:        Patient has prior history of Echocardiogram examinations, most                  recent 10/11/2019. Breast cancer.  Sonographer:    Clayton Lefort RDCS (AE) Referring Phys: [id number] DANIEL R BENSIMHON IMPRESSIONS  1. Low normal LV systolic function; GLS decreased (-12.4%).  2. Left ventricular ejection fraction, by estimation, is 50 to 55%. The left ventricle has low normal function. The left ventricle has no regional wall motion abnormalities. Left ventricular diastolic parameters were normal.  3. Right ventricular systolic function is normal. The right ventricular size is normal.  4. The mitral valve is normal in structure. Trivial mitral valve regurgitation. No evidence of mitral stenosis.  5. The aortic valve has an indeterminant number of cusps. Aortic valve regurgitation is not visualized. No aortic stenosis is present.  6. The inferior vena cava is normal in size with greater than 50% respiratory variability, suggesting right atrial pressure of 3 mmHg. FINDINGS  Left Ventricle: Left ventricular ejection fraction, by estimation, is 50 to 55%. The left ventricle has low normal function. The left ventricle has no regional wall motion abnormalities. The left ventricular internal cavity size was normal in size. There is no left ventricular hypertrophy. Left ventricular diastolic parameters were normal.  Right Ventricle: The right ventricular size is normal. Right ventricular systolic function is normal. Left Atrium: Left atrial size was normal in size. Right Atrium: Right atrial size was normal in size. Pericardium: There is no evidence of pericardial effusion. Mitral Valve: The mitral valve is normal in structure. Normal mobility of the mitral valve leaflets. Trivial mitral valve regurgitation. No evidence of mitral valve stenosis. MV peak gradient, 3.2 mmHg. The mean mitral valve gradient is 1.0 mmHg. Tricuspid Valve: The tricuspid valve is normal in structure. Tricuspid valve regurgitation is trivial. No evidence of tricuspid stenosis. Aortic Valve: The aortic valve has an indeterminant  number of cusps. Aortic valve regurgitation is not visualized. No aortic stenosis is present. Aortic valve mean gradient measures 2.0 mmHg. Aortic valve peak gradient measures 4.2 mmHg. Aortic valve area, by VTI measures 1.58 cm. Pulmonic Valve: The pulmonic valve was not well visualized. Pulmonic valve regurgitation is not visualized. No evidence of pulmonic stenosis. Aorta: The aortic root is normal in size and structure. Venous: The inferior vena cava is normal in size with greater than 50% respiratory variability, suggesting right atrial pressure of 3 mmHg. IAS/Shunts: No atrial level shunt detected by color flow Doppler. Additional Comments: Low normal LV systolic function; GLS decreased (-12.4%).  LEFT VENTRICLE PLAX 2D LVIDd:         4.60 cm  Diastology LVIDs:         3.90 cm  LV e' lateral:   6.85 cm/s LV PW:         0.90 cm  LV E/e' lateral: 11.9 LV IVS:        0.80 cm  LV e' medial:    7.72 cm/s LVOT diam:     1.80 cm  LV E/e' medial:  10.5 LV SV:         33 LV SV Index:   19 LVOT Area:     2.54 cm                          3D Volume EF:                         3D EF:        45 %                         LV EDV:       103 ml                         LV ESV:       57 ml                         LV SV:        46 ml RIGHT VENTRICLE            IVC RV Basal diam:  2.50 cm    IVC diam: 1.40 cm RV S prime:     7.89 cm/s TAPSE (M-mode): 2.0 cm LEFT ATRIUM             Index       RIGHT ATRIUM           Index LA diam:        2.40 cm 1.38 cm/m  RA Area:     11.10 cm LA Vol (A2C):   38.9 ml 22.29 ml/m RA  Volume:   24.20 ml  13.87 ml/m LA Vol (A4C):   45.5 ml 26.08 ml/m LA Biplane Vol: 42.8 ml 24.53 ml/m  AORTIC VALVE AV Area (Vmax):    1.46 cm AV Area (Vmean):   1.48 cm AV Area (VTI):     1.58 cm AV Vmax:           103.00 cm/s AV Vmean:          74.800 cm/s AV VTI:            0.211 m AV Peak Grad:      4.2 mmHg AV Mean Grad:      2.0 mmHg LVOT Vmax:         58.90 cm/s LVOT Vmean:        43.600 cm/s LVOT VTI:           0.131 m LVOT/AV VTI ratio: 0.62  AORTA Ao Root diam: 2.70 cm Ao Asc diam:  2.30 cm MITRAL VALVE MV Area (PHT): 5.97 cm    SHUNTS MV Peak grad:  3.2 mmHg    Systemic VTI:  0.13 m MV Mean grad:  1.0 mmHg    Systemic Diam: 1.80 cm MV Vmax:       0.89 m/s MV Vmean:      54.2 cm/s MV Decel Time: 127 msec MV E velocity: 81.40 cm/s MV A velocity: 55.30 cm/s MV E/A ratio:  1.47 Kirk Ruths MD Electronically signed by Kirk Ruths MD Signature Date/Time: 01/16/2020/11:03:12 AM    Final       ASSESSMENT and THERAPY PLAN:   Malignant neoplasm of upper-inner quadrant of right breast in female, estrogen receptor positive ([place]) 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%, PR 0%, HER-2 negative, Ki-67 20%; biopsy of the 4.5 cm mass came back as IDC grade 2-3, ER 50%, PR 5%, HER-2 -2+ ratio 1.6, copy #3.2, Ki-67 20%, T2N1 stage IIb  Recommendation: 1.Neoadjuvant chemotherapy with dose dense Adriamycinand Cytoxan x4 followed by Taxol weekly x11 05/09/2018-10/02/2018 2.11/20/2018:Rightmastectomy and axillary lymph node dissection 3.Since the final path was HER-2 positive she will get 1 year of Herceptinwhich will be completed in October 2021 4.Adjuvantradiation7/14/2020-02/14/2019 5.Followed by adjuvant antiestrogen therapypatient prefers tamoxifen starting 03/28/2019 ----------------------------------------------------------- 11/20/2018:Right mastectomy: Residual grade 2 invasive ductal carcinoma multifocal, largest 1.8 cm, intermediate grade DCIS, margins negative, lymphovascular space invasion present, 0/6 lymph nodes negative, ER 60%, PR 60%, HER-2 positive ratio 2.59  Current treatment: Herceptin maintenance every 3 weeks ,tamoxifen 20 mg daily 03/28/2019  Herceptin toxicities:Monitoring cardiac ejection  fraction Echocardiogram10/26/2020: EF 55 to 60%: Normal Echocardiogram 10/11/2019: EF 50 to 55% Echocardiogram 01/16/2020: EF 50-55%--ok to proceed with final Herceptin per Dr. Haroldine Laws  Tamoxifen toxicities:Manageable hot flashes.  Will continue this daily.  Breast cancer surveillance: Left mammogram 12/18/2019: Benign breast density category B, prior right mastectomy  Ranette is so delighted to be at the completion of her treatment.  She has been receiving this peripherally so she will not need a port removal.    She will continue treatment with oral Tamoxifen.  I will see her in 3 months for SCP visit.    She knows to call for any questions that may arise between now and her next appointment.  We are happy to see her sooner if needed.  Total encounter time: 20 minutes*  Mendel Ryder  Delice Bison, NP 01/17/20 11:38 AM Medical Oncology and Hematology [facility] [place], [place] [address] Tel. [phone]    Fax. [phone]  *Total Encounter Time as defined by the Centers for Medicare and Medicaid Services includes, in addition to the face-to-face time of a patient visit (documented in the note above) non-face-to-face time: obtaining and reviewing outside history, ordering and reviewing medications, tests or procedures, care coordination (communications with other health care professionals or caregivers) and documentation in the medical record.

## 2020-01-17 ENCOUNTER — Inpatient Hospital Stay: Payer: BC Managed Care – PPO

## 2020-01-17 ENCOUNTER — Encounter: Payer: Self-pay | Admitting: Adult Health

## 2020-01-17 ENCOUNTER — Encounter: Payer: Self-pay | Admitting: *Deleted

## 2020-01-17 ENCOUNTER — Inpatient Hospital Stay (HOSPITAL_BASED_OUTPATIENT_CLINIC_OR_DEPARTMENT_OTHER): Payer: BC Managed Care – PPO | Admitting: Adult Health

## 2020-01-17 ENCOUNTER — Other Ambulatory Visit: Payer: Self-pay

## 2020-01-17 VITALS — BP 133/99 | HR 75 | Temp 98.3°F | Resp 18 | Ht 61.0 in | Wt 167.2 lb

## 2020-01-17 DIAGNOSIS — Z170 Estrogen receptor positive status [ER+]: Secondary | ICD-10-CM

## 2020-01-17 DIAGNOSIS — C50211 Malignant neoplasm of upper-inner quadrant of right female breast: Secondary | ICD-10-CM

## 2020-01-17 DIAGNOSIS — Z5112 Encounter for antineoplastic immunotherapy: Secondary | ICD-10-CM | POA: Diagnosis not present

## 2020-01-17 LAB — CMP (CANCER CENTER ONLY)
ALT: 12 U/L (ref 0–44)
AST: 14 U/L — ABNORMAL LOW (ref 15–41)
Albumin: 3.7 g/dL (ref 3.5–5.0)
Alkaline Phosphatase: 48 U/L (ref 38–126)
Anion gap: 9 (ref 5–15)
BUN: 11 mg/dL (ref 6–20)
CO2: 22 mmol/L (ref 22–32)
Calcium: 9.6 mg/dL (ref 8.9–10.3)
Chloride: 109 mmol/L (ref 98–111)
Creatinine: 0.78 mg/dL (ref 0.44–1.00)
GFR, Est AFR Am: >60 mL/min (ref >60)
GFR, Estimated: >60 mL/min (ref >60)
Glucose, Bld: 86 mg/dL (ref 70–99)
Potassium: 4 mmol/L (ref 3.5–5.1)
Sodium: 140 mmol/L (ref 135–145)
Total Bilirubin: 0.3 mg/dL (ref 0.3–1.2)
Total Protein: 7.3 g/dL (ref 6.5–8.1)

## 2020-01-17 LAB — CBC WITH DIFFERENTIAL (CANCER CENTER ONLY)
Abs Immature Granulocytes: 0.01 10*3/uL (ref 0.00–0.07)
Basophils Absolute: 0 10*3/uL (ref 0.0–0.1)
Basophils Relative: 0 %
Eosinophils Absolute: 0.1 10*3/uL (ref 0.0–0.5)
Eosinophils Relative: 2 %
HCT: 40.4 % (ref 36.0–46.0)
Hemoglobin: 13 g/dL (ref 12.0–15.0)
Immature Granulocytes: 0 %
Lymphocytes Relative: 47 %
Lymphs Abs: 1.5 10*3/uL (ref 0.7–4.0)
MCH: 28.8 pg (ref 26.0–34.0)
MCHC: 32.2 g/dL (ref 30.0–36.0)
MCV: 89.6 fL (ref 80.0–100.0)
Monocytes Absolute: 0.3 10*3/uL (ref 0.1–1.0)
Monocytes Relative: 11 %
Neutro Abs: 1.3 10*3/uL — ABNORMAL LOW (ref 1.7–7.7)
Neutrophils Relative %: 40 %
Platelet Count: 159 10*3/uL (ref 150–400)
RBC: 4.51 MIL/uL (ref 3.87–5.11)
RDW: 13.2 % (ref 11.5–15.5)
WBC Count: 3.1 10*3/uL — ABNORMAL LOW (ref 4.0–10.5)
nRBC: 0 % (ref 0.0–0.2)

## 2020-01-17 MED ORDER — SODIUM CHLORIDE 0.9 % IV SOLN
Freq: Once | INTRAVENOUS | Status: AC
  Filled 2020-01-17: qty 250

## 2020-01-17 MED ORDER — DIPHENHYDRAMINE HCL 25 MG PO CAPS
50.0000 mg | ORAL_CAPSULE | Freq: Once | ORAL | Status: AC
  Administered 2020-01-17: 50 mg via ORAL

## 2020-01-17 MED ORDER — ACETAMINOPHEN 325 MG PO TABS
ORAL_TABLET | ORAL | Status: AC
  Filled 2020-01-17: qty 2

## 2020-01-17 MED ORDER — ACETAMINOPHEN 325 MG PO TABS
650.0000 mg | ORAL_TABLET | Freq: Once | ORAL | Status: AC
  Administered 2020-01-17: 650 mg via ORAL

## 2020-01-17 MED ORDER — TRASTUZUMAB-DKST CHEMO 150 MG IV SOLR
450.0000 mg | Freq: Once | INTRAVENOUS | Status: AC
  Administered 2020-01-17: 450 mg via INTRAVENOUS
  Filled 2020-01-17: qty 21.43

## 2020-01-17 MED ORDER — DIPHENHYDRAMINE HCL 25 MG PO CAPS
ORAL_CAPSULE | ORAL | Status: AC
  Filled 2020-01-17: qty 2

## 2020-01-17 NOTE — Assessment & Plan Note (Addendum)
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%, PR 0%, HER-2 negative, Ki-67 20%; biopsy of the 4.5 cm mass came back as IDC grade 2-3, ER 50%, PR 5%, HER-2 -2+ ratio 1.6, copy #3.2, Ki-67 20%, T2N1 stage IIb  Recommendation: 1.Neoadjuvant chemotherapy with dose dense Adriamycinand Cytoxan x4 followed by Taxol weekly x11 05/09/2018-10/02/2018 2.11/20/2018:Rightmastectomy and axillary lymph node dissection 3.Since the final path was HER-2 positive she will get 1 year of Herceptinwhich will be completed in October 2021 4.Adjuvantradiation7/14/2020-02/14/2019 5.Followed by adjuvant antiestrogen therapypatient prefers tamoxifen starting 03/28/2019 ----------------------------------------------------------- 11/20/2018:Right mastectomy: Residual grade 2 invasive ductal carcinoma multifocal, largest 1.8 cm, intermediate grade DCIS, margins negative, lymphovascular space invasion present, 0/6 lymph nodes negative, ER 60%, PR 60%, HER-2 positive ratio 2.59  Current treatment: Herceptin maintenance every 3 weeks ,tamoxifen 20 mg daily 03/28/2019  Herceptin toxicities:Monitoring cardiac ejection fraction Echocardiogram10/26/2020: EF 55 to 60%: Normal Echocardiogram 10/11/2019: EF 50 to 55% Echocardiogram 01/16/2020: EF 50-55%--ok to proceed with final Herceptin per Dr. Haroldine Laws  Tamoxifen toxicities:Manageable hot flashes.  Will continue this daily.  Breast cancer surveillance: Left mammogram 12/18/2019: Benign breast density category B, prior right mastectomy  See is so delighted to be at the completion of her treatment.  She has been receiving this peripherally so she will not need a port removal.    She will continue treatment with oral Tamoxifen.  I will see her in 3 months for SCP visit.

## 2020-01-17 NOTE — Patient Instructions (Signed)
[facility] Discharge Instructions for Patients Receiving Chemotherapy  Today you received the following chemotherapy agents: trastuzumab (herceptin).  To help prevent nausea and vomiting after your treatment, we encourage you to take your nausea medication as directed.   If you develop nausea and vomiting that is not controlled by your nausea medication, call the clinic.   BELOW ARE SYMPTOMS THAT SHOULD BE REPORTED IMMEDIATELY:  *FEVER GREATER THAN 100.5 F  *CHILLS WITH OR WITHOUT FEVER  NAUSEA AND VOMITING THAT IS NOT CONTROLLED WITH YOUR NAUSEA MEDICATION  *UNUSUAL SHORTNESS OF BREATH  *UNUSUAL BRUISING OR BLEEDING  TENDERNESS IN MOUTH AND THROAT WITH OR WITHOUT PRESENCE OF ULCERS  *URINARY PROBLEMS  *BOWEL PROBLEMS  UNUSUAL RASH Items with * indicate a potential emergency and should be followed up as soon as possible.  Feel free to call the clinic should you have any questions or concerns. The clinic phone number is [phone].  Please show the [facility] at check-in to the Emergency Department and triage nurse.

## 2020-04-16 NOTE — Progress Notes (Signed)
CARDIO-ONCOLOGY CLINIC NOTE HPI:  Miranda Ayers is 34 y.o. female with h/o obesity, HTN and breast cancer referred by Dr. Lindi Adie for enrollment into the Cardio-Oncology program due to reduced EF  SUMMARY OF ONCOLOGIC HISTORY:    Oncology History  Malignant neoplasm of upper-inner quadrant of right breast in female, estrogen receptor positive ([place])  04/17/2018 Initial Diagnosis   Palpable right breast masses UOQ and UIQ with multiple masses 12 to 14 cm, at 1:00 1.6 cm, 1231.8 cm, and o'clock 4.5 cm and at 9:30 position 5 cm: Biopsy revealed IDC with DCIS, grade 3, ER 80%, PR 0%, HER-2 negative, Ki-67 20%; biopsy of the 4.5 cm mass came back as IDC grade 2-3, ER 50%, PR 5%, HER-2 -2+ ratio 1.6, copy #3.2, Ki-67 20%, T2N1 stage IIb   05/09/2018 - 10/08/2018 Neo-Adjuvant Chemotherapy   Neoadjuvant chemotherapy with dose dense Adriamycinand Cytoxan x4 followed by Taxol weekly x12   06/19/2018 Genetic Testing   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 The Multi-Gene Panel offered by [facility] includes sequencing and/or deletion duplication testing of the following 84 genes: AIP, ALK, APC, ATM, AXIN2,BAP1, BARD1, BLM, BMPR1A, BRCA1, BRCA2, BRIP1, CASR, CDC73, CDH1, CDK4, CDKN1B, CDKN1C, CDKN2A (p14ARF), CDKN2A (p16INK4a), CEBPA, CHEK2, CTNNA1, DICER1, DIS3L2, EGFR (c.2369C>T, p.Thr790Met variant only), EPCAM (Deletion/duplication testing only), FH, FLCN, GATA2, GPC3, GREM1 (Promoter region deletion/duplication testing only), HOXB13 (c.251G>A, p.Gly84Glu), HRAS, KIT, MAX, MEN1, MET, MITF (c.952G>A, p.Glu318Lys variant only), MLH1, MSH2, MSH3, MSH6, MUTYH, NBN, NF1, NF2, NTHL1, PALB2, PDGFRA, PHOX2B, PMS2, POLD1, POLE, POT1, PRKAR1A, PTCH1, PTEN, RAD50, RAD51C, RAD51D, RB1, RECQL4, RET, RUNX1, SDHAF2, SDHA (sequence changes only), SDHB, SDHC, SDHD, SMAD4, SMARCA4, SMARCB1, SMARCE1, STK11, SUFU, TERC, TERT, TMEM127, TP53, TSC1, TSC2, VHL, WRN and WT1. The report  date is 06/19/2018.   10/11/2018 Breast MRI   Significant decrease in the size of the right axillary adenopathy and right breast masses from 1.2cm to 0.9cm with minimal residual enhancement, and 2.4x1.4cm to 2.3x1.9cm.   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, largest 1.8 cm, intermediate grade DCIS, margins negative, lymphovascular space invasion present, 0/6 lymph nodes negative, ER 60%, PR 60%, HER-2 positive ratio 2.59   11/20/2018 Cancer Staging   Staging form: Breast, AJCC 8th Edition - Pathologic stage from 11/20/2018: Stage IA (pT1c, pN0, cM0, G2, ER+, PR+, HER2+) - Signed by [name], NP on 12/05/2018   12/14/2018 -  Chemotherapy   The patient had trastuzumab (HERCEPTIN) 546 mg in sodium chloride 0.9 % 250 mL chemo infusion, 8 mg/kg = 546 mg, Intravenous, Once, 3 of 3 cycles Administration: 546 mg (12/14/2018), 399 mg (01/03/2019), 399 mg (01/24/2019) trastuzumab-dkst (OGIVRI) 399 mg in sodium chloride 0.9 % 250 mL chemo infusion, 6 mg/kg = 399 mg (100 % of original dose 6 mg/kg), Intravenous, Once, 8 of 14 cycles Dose modification: 6 mg/kg (original dose 6 mg/kg, Cycle 4, Reason: Other (see comments), Comment: Biosimilar Conversion) Administration: 399 mg (02/14/2019), 399 mg (03/07/2019), 399 mg (03/28/2019), 399 mg (04/18/2019), 399 mg (05/09/2019), 399 mg (05/30/2019), 399 mg (06/20/2019), 399 mg (07/11/2019) for chemotherapy treatment.    01/01/2019 - 02/14/2019 Radiation Therapy    She denies any h/o of known heart disease. Has had uncontrolled HTN. Found to have R breast CA in 11/19. Treated with adriamycin based regimen. Did fine. Post adriamycin echo was normal. Has mastectomy and found to have residual tumor in R breast which was HER-2+ . Started on  Herceptin in 6/20. Echo 10/20 EF 55-60%.  Echo 08/05/19 showwed EF 40-45%.  - adriamycin completed 4/20  - herceptin started 12/14/18  - herceptin held 3/21 -  herceptin restarted in 4/21 - herceptin completed 7/21  Here for routine f/u. Doing well. Denies CP, SOB, orthopnea, PND or edema. 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 Cancer [facility])    breast cancer  . Family history of colon cancer   . Family history of lung cancer   . Family history of pancreatic cancer   . Hypertension     Current Outpatient Medications  Medication Sig Dispense Refill  . carvedilol (COREG) 3.125 MG tablet Take 1 tablet (3.125 mg total) by mouth 2 (two) times daily. 60 tablet 3  . fluocinolone (SYNALAR) 0.025 % cream Apply topically 3 (three) times daily. 120 g 2  . sacubitril-valsartan (ENTRESTO) 49-51 MG Take 1 tablet by mouth 2 (two) times daily. 60 tablet 3  . tamoxifen (NOLVADEX) 20 MG tablet Take 1 tablet (20 mg total) by mouth daily. 90 tablet 3   No current facility-administered medications for this encounter.    No Known Allergies    Social History   Socioeconomic History  . Marital status: Single    Spouse name: Not on file  . Number of children: Not on file  . Years of education: Not on file  . Highest education level: Not on file  Occupational History  . Not on file  Tobacco Use  . Smoking status: Never Smoker  . Smokeless tobacco: Never Used  Vaping Use  . Vaping Use: Never used  Substance and Sexual Activity  . Alcohol use: Never  . Drug use: Never  . Sexual activity: Not Currently    Birth control/protection: None  Other Topics Concern  . Not on file  Social History Narrative  . Not on file   Social Determinants of Health   Financial Resource Strain:   . Difficulty of Paying Living Expenses: Not on file  Food Insecurity:   . Worried About Paediatric nurse in the Last Year: Not on file  . Ran Out of Food in the Last Year: Not on file  Transportation Needs:   . Lack of Transportation (Medical): Not on file  . Lack of Transportation (Non-Medical): Not on file  Physical Activity:   . Days of Exercise per Week: Not on file  . Minutes of Exercise per Session: Not on file  Stress:   . Feeling of Stress : Not on file  Social Connections:   . Frequency of Communication with Friends and Family: Not on file  . Frequency of Social Gatherings with Friends and Family: Not on file  . Attends Religious Services: Not on file  . Active Member of Clubs or Organizations: Not on file  . Attends Archivist Meetings: Not on file  . Marital Status: Not on file  Intimate Partner Violence:   . Fear of Current or Ex-Partner: Not on file  . Emotionally Abused: Not on file  . Physically Abused: Not on file  . Sexually Abused: Not on file      Family History  Problem Relation Age of Onset  .  Lung cancer Maternal Grandmother   . Lung cancer Maternal Grandfather   . Colon cancer Maternal Grandfather   . Stomach cancer Paternal Grandfather   . Lung cancer Maternal Aunt   . Pancreatic cancer Maternal Aunt   . Lung cancer Maternal Uncle   . Cancer Cousin        childhood cancer in mat first cousin    Vitals:   04/17/20 1123  BP: (!) 140/97  Pulse: 71  SpO2: 100%  Weight: 80.2 kg (176 lb 12.8 oz)    PHYSICAL EXAM: General:  Well appearing. No resp difficulty HEENT: normal Neck: supple. no JVD. Carotids 2+ bilat; no bruits. No lymphadenopathy or thryomegaly appreciated. Cor: PMI nondisplaced. Regular rate & rhythm. No rubs, gallops or murmurs. Lungs: clear Abdomen: soft, nontender, nondistended. No hepatosplenomegaly. No bruits or masses. Good bowel sounds. Extremities: no cyanosis, clubbing, rash, edema Neuro: alert & orientedx3, cranial nerves grossly intact. moves all 4 extremities w/o difficulty. 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 Uncontrolled HTN may also be playing a role - Herceptin held in March 2021. Restarted 4/21. Finished 7/21 - EF 7/21 stable at 50-55%. I suspect she has mild anthracycline toxicity. (vs HTN related). - Echo today 04/17/20 EF 50-55% - Increase carvedilol 6.25 bid - Increase Entresto 97/103 bid - Add spiro at next visit   2. HTN - Blood pressure elevated. Increasing Entresto   3. Breast Cancer - as above s/p adriamycin and Herceptin    Glori Bickers, MD  11:36 AM

## 2020-04-17 ENCOUNTER — Ambulatory Visit (HOSPITAL_BASED_OUTPATIENT_CLINIC_OR_DEPARTMENT_OTHER)
Admission: RE | Admit: 2020-04-17 | Discharge: 2020-04-17 | Disposition: A | Discharge status: Home / Self Care | Payer: BC Managed Care – PPO | Source: Ambulatory Visit | Attending: Internal Medicine | Admitting: Internal Medicine

## 2020-04-17 ENCOUNTER — Other Ambulatory Visit: Payer: Self-pay

## 2020-04-17 ENCOUNTER — Ambulatory Visit (HOSPITAL_COMMUNITY)
Admission: RE | Admit: 2020-04-17 | Discharge: 2020-04-17 | Disposition: A | Discharge status: Home / Self Care | Payer: BC Managed Care – PPO | Source: Ambulatory Visit | Attending: Internal Medicine | Admitting: Internal Medicine

## 2020-04-17 VITALS — BP 140/97 | HR 71 | Wt 176.8 lb

## 2020-04-17 DIAGNOSIS — Z809 Family history of malignant neoplasm, unspecified: Secondary | ICD-10-CM | POA: Insufficient documentation

## 2020-04-17 DIAGNOSIS — Z801 Family history of malignant neoplasm of trachea, bronchus and lung: Secondary | ICD-10-CM | POA: Insufficient documentation

## 2020-04-17 DIAGNOSIS — C50211 Malignant neoplasm of upper-inner quadrant of right female breast: Secondary | ICD-10-CM | POA: Diagnosis not present

## 2020-04-17 DIAGNOSIS — I427 Cardiomyopathy due to drug and external agent: Secondary | ICD-10-CM

## 2020-04-17 DIAGNOSIS — I10 Essential (primary) hypertension: Secondary | ICD-10-CM | POA: Insufficient documentation

## 2020-04-17 DIAGNOSIS — Z170 Estrogen receptor positive status [ER+]: Secondary | ICD-10-CM | POA: Diagnosis not present

## 2020-04-17 DIAGNOSIS — Z79899 Other long term (current) drug therapy: Secondary | ICD-10-CM | POA: Insufficient documentation

## 2020-04-17 DIAGNOSIS — Z800 Family history of malignant neoplasm of digestive organs: Secondary | ICD-10-CM | POA: Insufficient documentation

## 2020-04-17 DIAGNOSIS — X58XXXA Exposure to other specified factors, initial encounter: Secondary | ICD-10-CM | POA: Diagnosis not present

## 2020-04-17 DIAGNOSIS — Z0189 Encounter for other specified special examinations: Secondary | ICD-10-CM | POA: Diagnosis not present

## 2020-04-17 DIAGNOSIS — T451X5A Adverse effect of antineoplastic and immunosuppressive drugs, initial encounter: Secondary | ICD-10-CM | POA: Diagnosis not present

## 2020-04-17 DIAGNOSIS — I5022 Chronic systolic (congestive) heart failure: Secondary | ICD-10-CM | POA: Diagnosis not present

## 2020-04-17 LAB — ECHOCARDIOGRAM COMPLETE
Area-P 1/2: 4.89 cm2
S' Lateral: 3.4 cm
Single Plane A4C EF: 43.2 %

## 2020-04-17 MED ORDER — SACUBITRIL-VALSARTAN 97-103 MG PO TABS: 1.0000 | ORAL_TABLET | Freq: Two times a day (BID) | ORAL | 3 refills | Status: DC

## 2020-04-17 MED ORDER — CARVEDILOL 6.25 MG PO TABS
6.2500 mg | ORAL_TABLET | Freq: Two times a day (BID) | ORAL | 3 refills | Status: DC
Start: 2020-04-17 — End: 2020-09-22

## 2020-04-17 NOTE — Patient Instructions (Signed)
Increase Entresto to 97/103 (1 tab) 2 times a day.  Increase carvedilol  to 6.25 mg 2 times a day.  If you have any questions or concerns before your next appointment please send [place] a message through [place] or call our office at [phone].    TO LEAVE A MESSAGE FOR THE NURSE SELECT OPTION 2, PLEASE LEAVE A MESSAGE INCLUDING: . YOUR NAME . DATE OF BIRTH . CALL BACK NUMBER . REASON FOR CALL**this is important as we prioritize the call backs  YOU WILL RECEIVE A CALL BACK THE SAME DAY AS LONG AS YOU CALL BEFORE 4:00 PM

## 2020-04-17 NOTE — Progress Notes (Signed)
  Echocardiogram 2D Echocardiogram has been performed.  Johny Chess 04/17/2020, 11:12 AM

## 2020-04-17 NOTE — Addendum Note (Signed)
Encounter addended by: Stanford Scotland, RN on: 04/17/2020 11:55 AM  Actions taken: Order list changed, Clinical Note Signed

## 2020-04-22 ENCOUNTER — Encounter: Payer: BC Managed Care – PPO | Admitting: Adult Health

## 2020-05-01 ENCOUNTER — Other Ambulatory Visit: Payer: Self-pay

## 2020-05-01 ENCOUNTER — Inpatient Hospital Stay: Payer: BC Managed Care – PPO | Attending: Adult Health | Admitting: Adult Health

## 2020-05-01 ENCOUNTER — Encounter: Payer: Self-pay | Admitting: Adult Health

## 2020-05-01 VITALS — BP 133/86 | HR 80 | Temp 97.8°F | Resp 20 | Wt 176.4 lb

## 2020-05-01 DIAGNOSIS — Z170 Estrogen receptor positive status [ER+]: Secondary | ICD-10-CM

## 2020-05-01 DIAGNOSIS — R232 Flushing: Secondary | ICD-10-CM | POA: Insufficient documentation

## 2020-05-01 DIAGNOSIS — Z9221 Personal history of antineoplastic chemotherapy: Secondary | ICD-10-CM | POA: Insufficient documentation

## 2020-05-01 DIAGNOSIS — Z79899 Other long term (current) drug therapy: Secondary | ICD-10-CM | POA: Diagnosis not present

## 2020-05-01 DIAGNOSIS — N951 Menopausal and female climacteric states: Secondary | ICD-10-CM | POA: Insufficient documentation

## 2020-05-01 DIAGNOSIS — C50211 Malignant neoplasm of upper-inner quadrant of right female breast: Secondary | ICD-10-CM | POA: Diagnosis not present

## 2020-05-01 DIAGNOSIS — I10 Essential (primary) hypertension: Secondary | ICD-10-CM | POA: Diagnosis not present

## 2020-05-01 DIAGNOSIS — Z79810 Long term (current) use of selective estrogen receptor modulators (SERMs): Secondary | ICD-10-CM | POA: Diagnosis not present

## 2020-05-01 DIAGNOSIS — Z923 Personal history of irradiation: Secondary | ICD-10-CM | POA: Diagnosis not present

## 2020-05-01 DIAGNOSIS — Z9011 Acquired absence of right breast and nipple: Secondary | ICD-10-CM | POA: Diagnosis not present

## 2020-05-01 MED ORDER — TAMOXIFEN CITRATE 20 MG PO TABS: 20.0000 mg | ORAL_TABLET | Freq: Every day | ORAL | 4 refills | Status: DC

## 2020-05-01 NOTE — Progress Notes (Signed)
SURVIVORSHIP VISIT:   BRIEF ONCOLOGIC HISTORY:  Oncology History  Malignant neoplasm of upper-inner quadrant of right breast in female, estrogen receptor positive ([place])  04/17/2018 Initial Diagnosis   Palpable right breast masses UOQ and UIQ with multiple masses 12 to 14 cm, at 1:00 1.6 cm, 1231.8 cm, and o'clock 4.5 cm and at 9:30 position 5 cm: Biopsy revealed IDC with DCIS, grade 3, ER 80%, PR 0%, HER-2 negative, Ki-67 20%; biopsy of the 4.5 cm mass came back as IDC grade 2-3, ER 50%, PR 5%, HER-2 -2+ ratio 1.6, copy #3.2, Ki-67 20%, T2N1 stage IIb   05/11/2018 - 01/17/2020 Chemotherapy   dexamethasone (DECADRON) 4 MG tablet, 4 mg (100 % of original dose 4 mg), Oral, Daily, 1 of 1 cycle, Start date: 04/25/2018, End date: 07/10/2018. Dose modification: 4 mg (original dose 4 mg, Cycle 0)  DOXOrubicin (ADRIAMYCIN) chemo injection 104 mg, 60 mg/m2 = 104 mg, Intravenous,  Once, 4 of 4 cycles. Administration: 104 mg (05/11/2018), 104 mg (05/23/2018), 104 mg (06/06/2018), 104 mg (06/22/2018)  palonosetron (ALOXI) injection 0.25 mg, 0.25 mg, Intravenous,  Once, 4 of 4 cycles. Administration: 0.25 mg (05/11/2018), 0.25 mg (05/23/2018), 0.25 mg (06/06/2018), 0.25 mg (06/22/2018)  pegfilgrastim-cbqv (UDENYCA) injection 6 mg, 6 mg, Subcutaneous, Once, 4 of 4 cycles. Administration: 6 mg (05/12/2018), 6 mg (05/24/2018), 6 mg (06/07/2018), 6 mg (06/23/2018)  cyclophosphamide (CYTOXAN) 1,040 mg in sodium chloride 0.9 % 250 mL chemo infusion, 600 mg/m2 = 1,040 mg, Intravenous,  Once, 4 of 4 cycles. Administration: 1,040 mg (05/11/2018), 1,040 mg (05/23/2018), 1,040 mg (06/06/2018), 1,040 mg (06/22/2018)  PACLitaxel (TAXOL) 138 mg in sodium chloride 0.9 % 250 mL chemo infusion (</= 24m/m2), 80 mg/m2 = 138 mg, Intravenous,  Once, 11 of 12 cycles. Administration: 138 mg (07/10/2018), 138 mg (07/17/2018), 138 mg (07/24/2018), 138 mg (08/14/2018), 138 mg (08/22/2018), 138 mg (08/28/2018), 138 mg (09/04/2018), 138 mg (09/11/2018), 138 mg  (09/18/2018), 138 mg (09/25/2018), 138 mg (10/02/2018)  fosaprepitant (EMEND) 150 mg  dexamethasone (DECADRON) 12 mg in sodium chloride 0.9 % 145 mL IVPB, , Intravenous,  Once, 4 of 4 cycles. Administration:  (05/11/2018),  (05/23/2018),  (06/06/2018),  (06/22/2018)  trastuzumab (HERCEPTIN) 546 mg in sodium chloride 0.9 % 250 mL chemo infusion, 8 mg/kg = 546 mg, Intravenous,  Once, 3 of 3 cycles. Administration: 546 mg (12/14/2018), 399 mg (01/03/2019), 399 mg (01/24/2019)  trastuzumab-dkst (OGIVRI) 399 mg in sodium chloride 0.9 % 250 mL chemo infusion, 6 mg/kg = 399 mg (100 % of original dose 6 mg/kg), Intravenous,  Once, 14 of 14 cycles. Dose modification: 6 mg/kg (original dose 6 mg/kg, Cycle 4, Reason: Other (see comments), Comment: Biosimilar Conversion), 6 mg/kg (original dose 6 mg/kg, Cycle 15, Reason: Other (see comments), Comment: weight increase). 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  The Multi-Gene Panel offered by [facility] includes sequencing and/or deletion duplication testing of the following 84 genes: AIP, ALK, APC, ATM, AXIN2,BAP1,  BARD1, BLM, BMPR1A, BRCA1, BRCA2, BRIP1, CASR, CDC73, CDH1, CDK4, CDKN1B, CDKN1C, CDKN2A (p14ARF), CDKN2A (p16INK4a), CEBPA, CHEK2, CTNNA1, DICER1, DIS3L2, EGFR (c.2369C>T, p.Thr790Met variant only), EPCAM (Deletion/duplication testing only), FH, FLCN, GATA2, GPC3, GREM1 (Promoter region deletion/duplication testing  only), HOXB13 (c.251G>A, p.Gly84Glu), HRAS, KIT, MAX, MEN1, MET, MITF (c.952G>A, p.Glu318Lys variant only), MLH1, MSH2, MSH3, MSH6, MUTYH, NBN, NF1, NF2, NTHL1, PALB2, PDGFRA, PHOX2B, PMS2, POLD1,  POLE, POT1, PRKAR1A, PTCH1, PTEN, RAD50, RAD51C, RAD51D, RB1, RECQL4, RET, RUNX1, SDHAF2, SDHA (sequence changes only), SDHB, SDHC, SDHD, SMAD4, SMARCA4, SMARCB1, SMARCE1, STK11, SUFU, TERC, TERT, TMEM127, TP53, TSC1, TSC2, VHL, WRN and WT1.  The report date is 06/19/2018.   10/11/2018 Breast MRI   Significant decrease in the size of the right axillary adenopathy and right breast masses from 1.2cm to 0.9cm with minimal residual enhancement, and 2.4x1.4cm to 2.3x1.9cm.   11/20/2018 Surgery   Right mastectomy ([place]) ([id number]): Residual grade 2 invasive ductal carcinoma multifocal, largest 1.8 cm, intermediate grade DCIS, margins negative, lymphovascular space invasion present, 6 lymph nodes negative, ER 60%, PR 60%, HER-2 positive ratio 2.59   11/20/2018 Cancer Staging   Staging form: Breast, AJCC 8th Edition - Pathologic stage from 11/20/2018: Stage IA (pT1c, pN0, cM0, G2, ER+, PR+, HER2+)   01/01/2019 - 02/14/2019 Radiation Therapy   The patient initially received a dose of 50.4 Gy in 28 fractions to the chest wall and supraclavicular region. This was delivered using a 3-D conformal, 4 field technique. The patient then received a boost to the mastectomy scar. This delivered an additional 10 Gy in 5 fractions using an en face electron field. The total dose was 60.4 Gy.   03/2019 -  Anti-estrogen oral therapy   Tamoxifen daily     INTERVAL HISTORY:  Ms. Grissom to review her survivorship care plan detailing her treatment course for breast cancer, as well as monitoring long-term side effects of that treatment, education regarding health maintenance, screening, and overall wellness and health promotion.     Overall, Ms. Gortney reports feeling quite well.  She is taking tamoxifen daily.  She notes hot flashes,worse at night and this interferes with her sleep.  She also feels lopsided with her left breast after surgery and looks forward to reconstruction when it can happen.  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   REVIEW OF SYSTEMS:  Review of Systems  Constitutional: Negative for appetite change, chills, fatigue, fever and unexpected weight change.  HENT:   Negative for hearing loss, lump/mass and trouble swallowing.   Eyes: Negative for eye problems and icterus.  Respiratory: Negative for chest tightness, cough and shortness of breath.   Cardiovascular: Negative for chest pain, leg swelling and palpitations.  Gastrointestinal: Negative for abdominal distention, abdominal pain, constipation, diarrhea, nausea and vomiting.  Endocrine: Positive for hot flashes.  Genitourinary: Negative for difficulty urinating.   Musculoskeletal: Negative for arthralgias.  Skin: Negative for itching and rash.  Neurological: Negative for dizziness, extremity weakness, headaches and numbness.  Hematological: Negative for adenopathy. Does not bruise/bleed easily.  Psychiatric/Behavioral: Positive for sleep disturbance. Negative for depression. The patient is not nervous/anxious.    Breast: Denies any new nodularity, masses, tenderness, nipple changes, or nipple discharge.      ONCOLOGY TREATMENT TEAM:  1. Surgeon:  Dr. Brantley Stage at [facility] 2. Medical Oncologist: Dr. Lindi Adie  3. Radiation Oncologist: Dr. Lisbeth Renshaw    PAST MEDICAL/SURGICAL HISTORY:  Past Medical History:  Diagnosis Date  . Cancer [facility])    breast cancer  . Family history of colon cancer   . Family history of lung cancer   . Family history of pancreatic cancer   . Hypertension    Past  Surgical History:  Procedure Laterality Date  . MASTECTOMY WITH RADIOACTIVE SEED GUIDED EXCISION AND AXILLARY SENTINEL LYMPH NODE BIOPSY Right 11/20/2018   Procedure: RIGHT SIMPLE MASTECTOMY WITH RIGHT RADIOACTIVE SEED TARGETED LYMPH NODE BIOPSY AND RIGHT  AXILLARY SENTINEL LYMPH NODE MAPPING AND PORT REMOVEL RIGHT CHEST;  Surgeon: [name], MD;  Location: [place];   Service: General;  Laterality: Right;  . OOPHORECTOMY  2013  . PORTACATH PLACEMENT N/A 05/08/2018   Procedure: INSERTION PORT-A-CATH WITH ULTRASOUND;  Surgeon: [name], MD;  Location: [place];  Service: General;  Laterality: N/A;     ALLERGIES:  No Known Allergies   CURRENT MEDICATIONS:  Outpatient Encounter Medications as of 05/01/2020  Medication Sig Note  . carvedilol (COREG) 6.25 MG tablet Take 1 tablet (6.25 mg total) by mouth 2 (two) times daily.   . fluocinolone (SYNALAR) 0.025 % cream Apply topically 3 (three) times daily.   . sacubitril-valsartan (ENTRESTO) 97-103 MG Take 1 tablet by mouth 2 (two) times daily.   . tamoxifen (NOLVADEX) 20 MG tablet Take 1 tablet (20 mg total) by mouth daily.   . [DISCONTINUED] tamoxifen (NOLVADEX) 20 MG tablet Take 1 tablet (20 mg total) by mouth daily.   . [DISCONTINUED] prochlorperazine (COMPAZINE) 10 MG tablet Take 1 tablet (10 mg total) by mouth every 6 (six) hours as needed (Nausea or vomiting). 04/30/2018: Pt has not started    No facility-administered encounter medications on file as of 05/01/2020.     ONCOLOGIC FAMILY HISTORY:  Family History  Problem Relation Age of Onset  . Lung cancer Maternal Grandmother   . Lung cancer Maternal Grandfather   . Colon cancer Maternal Grandfather   . Stomach cancer Paternal Grandfather   . Lung cancer Maternal Aunt   . Pancreatic cancer Maternal Aunt   . Lung cancer Maternal Uncle   . Cancer Cousin        childhood cancer in mat first cousin     GENETIC COUNSELING/TESTING: See above  SOCIAL HISTORY:  Social History   Socioeconomic History  . Marital status: Single    Spouse name: Not on file  . Number of children: Not on file  . Years of education: Not on file  . Highest education level: Not on file  Occupational History  . Not on file  Tobacco Use  . Smoking status: Never Smoker  . Smokeless tobacco: Never Used  Vaping Use  . Vaping Use: Never  used  Substance and Sexual Activity  . Alcohol use: Never  . Drug use: Never  . Sexual activity: Not Currently    Birth control/protection: None  Other Topics Concern  . Not on file  Social History Narrative  . Not on file   Social Determinants of Health   Financial Resource Strain:   . Difficulty of Paying Living Expenses: Not on file  Food Insecurity:   . Worried About Charity fundraiser in the Last Year: Not on file  . Ran Out of Food in the Last Year: Not on file  Transportation Needs:   . Lack of Transportation (Medical): Not on file  . Lack of Transportation (Non-Medical): Not on file  Physical Activity:   . Days of Exercise per Week: Not on file  . Minutes of Exercise per Session: Not on file  Stress:   . Feeling of Stress : Not on file  Social Connections:   . Frequency of Communication with Friends and Family: Not on file  . Frequency of  Social Gatherings with Friends and Family: Not on file  . Attends Religious Services: Not on file  . Active Member of Clubs or Organizations: Not on file  . Attends Archivist Meetings: Not on file  . Marital Status: Not on file  Intimate Partner Violence:   . Fear of Current or Ex-Partner: Not on file  . Emotionally Abused: Not on file  . Physically Abused: Not on file  . Sexually Abused: Not on file     OBSERVATIONS/OBJECTIVE:  BP 133/86   Pulse 80   Temp 97.8 F (36.6 C) (Tympanic)   Resp 20   Wt 176 lb 6.4 oz (80 kg)   SpO2 100%   BMI 33.33 kg/m  GENERAL: Patient is a well appearing female in no acute distress HEENT:  Sclerae anicteric.  Mask in place. Neck is supple.  NODES:  No cervical, supraclavicular, or axillary lymphadenopathy palpated.  BREAST EXAM:  S/p right mastectomy and radiation, no sign of local recurrence, left brest benign LUNGS:  Clear to auscultation bilaterally.  No wheezes or rhonchi. HEART:  Regular rate and rhythm. No murmur appreciated. ABDOMEN:  Soft, nontender.  Positive,  normoactive bowel sounds. No organomegaly palpated. MSK:  No focal spinal tenderness to palpation. Full range of motion bilaterally in the upper extremities. EXTREMITIES:  No peripheral edema.   SKIN:  Clear with no obvious rashes or skin changes. No nail dyscrasia. NEURO:  Nonfocal. Well oriented.  Appropriate affect.    LABORATORY DATA:  None for this visit.  DIAGNOSTIC IMAGING:  CLINICAL DATA:  Screening.  EXAM: DIGITAL SCREENING UNILATERAL LEFT MAMMOGRAM WITH CAD  COMPARISON:  Previous exam(s).  ACR Breast Density Category b: There are scattered areas of fibroglandular density.  FINDINGS: The patient has had a right mastectomy. There are no findings suspicious for malignancy.  Images were processed with CAD.  IMPRESSION: No mammographic evidence of malignancy. A result letter of this screening mammogram will be mailed directly to the patient.  RECOMMENDATION: Screening mammogram in one year.  (Code:[id number]-[id number])  BI-RADS CATEGORY  1: Negative.   Electronically Signed   By: Claudie Revering M.D.   On: 12/20/2019 09:02    ASSESSMENT AND PLAN:  Ms.. Cumberland is a pleasant 34 y.o. female with Stage IIB right breast invasive ductal carcinoma, ER+/PR-/HER2-, diagnosed in 03/2018, treated with neoadjuvant chemotherapy, right mastectomy, adjuvant radiation therapy, and Tamoxifen daily beginning in 03/2019.  She presents to the Survivorship Clinic for our initial meeting and routine follow-up post-completion of treatment for breast cancer.    1. Stage IIB right breast cancer:  Ms. Pacini is continuing to recover from definitive treatment for breast cancer. She will follow-up with her medical oncologist, Dr. Lindi Adie in 3 months with history and physical exam per surveillance protocol.  She will continue her anti-estrogen therapy with Tamoxifen. Thus far, she is tolerating the Tamoxifen well, with minimal side effects. She was instructed to make Dr. Lindi Adie or myself aware  if she begins to experience any worsening side effects of the medication and I could see her back in clinic to help manage those side effects, as needed. Her left breast screening mammogram is due 12/2020; orders placed today.   Today, a comprehensive survivorship care plan and treatment summary was reviewed with the patient today detailing her breast cancer diagnosis, treatment course, potential late/long-term effects of treatment, appropriate follow-up care with recommendations for the future, and patient education resources.  A copy of this summary, along with a letter will be sent to  the patient's primary care provider via mail/fax/In Basket message after today's visit.    2. Hot flashes/difficulty sleeping: She will change the time of day that she takes the Tamoxifen.  We discussed Gabapentin and she does not want to take this at this point.    3. Bone health:  She was given education on specific activities to promote bone health.  4. Cancer screening:  Due to Ms. Wilson's history and her age, she should receive screening for skin cancers, colon cancer, and gynecologic cancers.  The information and recommendations are listed on the patient's comprehensive care plan/treatment summary and were reviewed in detail with the patient.    5. Health maintenance and wellness promotion: Ms. Cavness was encouraged to consume 5-7 servings of fruits and vegetables per day. We reviewed the "Nutrition Rainbow" handout, as well as the handout "Take Control of Your Health and Reduce Your Cancer Risk" from the [place].  She was also encouraged to engage in moderate to vigorous exercise for 30 minutes per day most days of the week. We discussed the [employer] fitness program, which is designed for cancer survivors to help them become more physically fit after cancer treatments.  She was instructed to limit her alcohol consumption and continue to abstain from tobacco use.     6. Support  services/counseling: It is not uncommon for this period of the patient's cancer care trajectory to be one of many emotions and stressors.  We discussed how this can be increasingly difficult during the times of quarantine and social distancing due to the COVID-19 pandemic.   She was given information regarding our available services and encouraged to contact me with any questions or for help enrolling in any of our support group/programs.    Follow up instructions:    -Return to cancer center in 3 months for  F/u with Dr. Lindi Adie -Mammogram due in 12/2020 -She is welcome to return back to the Survivorship Clinic at any time; no additional follow-up needed at this time.  -Consider referral back to survivorship as a long-term survivor for continued surveillance  The patient was provided an opportunity to ask questions and all were answered. The patient agreed with the plan and demonstrated an understanding of the instructions.   Total encounter time: 45 minutes*  Wilber Bihari, NP 05/01/20 9:18 AM Medical Oncology and Hematology [facility] [place], [place] [address] Tel. [phone]    Fax. [phone]  *Total Encounter Time as defined by the Centers for Medicare and Medicaid Services includes, in addition to the face-to-face time of a patient visit (documented in the note above) non-face-to-face time: obtaining and reviewing outside history, ordering and reviewing medications, tests or procedures, care coordination (communications with other health care professionals or caregivers) and documentation in the medical record.

## 2020-05-04 ENCOUNTER — Telehealth: Payer: Self-pay | Admitting: Adult Health

## 2020-05-04 NOTE — Telephone Encounter (Signed)
No 11/12 los. No changes made to pt's schedule.

## 2020-06-25 NOTE — Assessment & Plan Note (Signed)
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%, PR 0%, HER-2 negative, Ki-67 20%; biopsy of the 4.5 cm mass came back as IDC grade 2-3, ER 50%, PR 5%, HER-2 -2+ ratio 1.6, copy #3.2, Ki-67 20%, T2N1 stage IIb  Recommendation: 1.Neoadjuvant chemotherapy with dose dense Adriamycinand Cytoxan x4 followed by Taxol weekly x11 05/09/2018-10/02/2018 2.11/20/2018:Rightmastectomy and axillary lymph node dissection 3.Since the final path was HER-2 positive she will get 1 year of Herceptincompleted 01/17/20 4.Adjuvantradiation7/14/2020-02/14/2019 5.Followed by adjuvant antiestrogen therapypatient prefers tamoxifen starting 03/28/2019 ----------------------------------------------------------- 11/20/2018:Right mastectomy: Residual grade 2 invasive ductal carcinoma multifocal, largest 1.8 cm, intermediate grade DCIS, margins negative, lymphovascular space invasion present, 0/6 lymph nodes negative, ER 60%, PR 60%, HER-2 positive ratio 2.59  Current treatment: Tamoxifen 20 mg daily 03/28/2019  Tamoxifen toxicities:Manageable hot flashes. Will continue this daily.  Breast cancer surveillance:  Left mammogram 12/18/2019: Benign breast density category B, prior right mastectomy Breast Exam: 06/26/20: Benign

## 2020-06-25 NOTE — Progress Notes (Signed)
Patient Care Team: [name], DO as PCP - General (Family Medicine) Bensimhon, Shaune Pascal, MD as PCP - Advanced Heart Failure (Cardiology) [name], MD as Consulting Physician (General Surgery) [name], MD as Consulting Physician (Hematology and Oncology) Kyung Rudd, MD as Consulting Physician (Radiation Oncology)  DIAGNOSIS:    ICD-10-CM   1. Malignant neoplasm of upper-inner quadrant of right breast in female, estrogen receptor positive ([employer])  C50.211    Z17.0     SUMMARY OF ONCOLOGIC HISTORY: Oncology History  Malignant neoplasm of upper-inner quadrant of right breast in female, estrogen receptor positive ([place])  04/17/2018 Initial Diagnosis   Palpable right breast masses UOQ and UIQ with multiple masses 12 to 14 cm, at 1:00 1.6 cm, 1231.8 cm, and o'clock 4.5 cm and at 9:30 position 5 cm: Biopsy revealed IDC with DCIS, grade 3, ER 80%, PR 0%, HER-2 negative, Ki-67 20%; biopsy of the 4.5 cm mass came back as IDC grade 2-3, ER 50%, PR 5%, HER-2 -2+ ratio 1.6, copy #3.2, Ki-67 20%, T2N1 stage IIb   05/11/2018 - 01/17/2020 Chemotherapy   dexamethasone (DECADRON) 4 MG tablet, 4 mg (100 % of original dose 4 mg), Oral, Daily, 1 of 1 cycle, Start date: 04/25/2018, End date: 07/10/2018. Dose modification: 4 mg (original dose 4 mg, Cycle 0)  DOXOrubicin (ADRIAMYCIN) chemo injection 104 mg, 60 mg/m2 = 104 mg, Intravenous,  Once, 4 of 4 cycles. Administration: 104 mg (05/11/2018), 104 mg (05/23/2018), 104 mg (06/06/2018), 104 mg (06/22/2018)  palonosetron (ALOXI) injection 0.25 mg, 0.25 mg, Intravenous,  Once, 4 of 4 cycles. Administration: 0.25 mg (05/11/2018), 0.25 mg (05/23/2018), 0.25 mg (06/06/2018), 0.25 mg (06/22/2018)  pegfilgrastim-cbqv (UDENYCA) injection 6 mg, 6 mg, Subcutaneous, Once, 4 of 4 cycles. Administration: 6 mg (05/12/2018), 6 mg (05/24/2018), 6 mg (06/07/2018), 6 mg (06/23/2018)  cyclophosphamide (CYTOXAN) 1,040 mg in sodium chloride 0.9 % 250 mL chemo  infusion, 600 mg/m2 = 1,040 mg, Intravenous,  Once, 4 of 4 cycles. Administration: 1,040 mg (05/11/2018), 1,040 mg (05/23/2018), 1,040 mg (06/06/2018), 1,040 mg (06/22/2018)  PACLitaxel (TAXOL) 138 mg in sodium chloride 0.9 % 250 mL chemo infusion (</= 23m/m2), 80 mg/m2 = 138 mg, Intravenous,  Once, 11 of 12 cycles. Administration: 138 mg (07/10/2018), 138 mg (07/17/2018), 138 mg (07/24/2018), 138 mg (08/14/2018), 138 mg (08/22/2018), 138 mg (08/28/2018), 138 mg (09/04/2018), 138 mg (09/11/2018), 138 mg (09/18/2018), 138 mg (09/25/2018), 138 mg (10/02/2018)  fosaprepitant (EMEND) 150 mg  dexamethasone (DECADRON) 12 mg in sodium chloride 0.9 % 145 mL IVPB, , Intravenous,  Once, 4 of 4 cycles. Administration:  (05/11/2018),  (05/23/2018),  (06/06/2018),  (06/22/2018)  trastuzumab (HERCEPTIN) 546 mg in sodium chloride 0.9 % 250 mL chemo infusion, 8 mg/kg = 546 mg, Intravenous,  Once, 3 of 3 cycles. Administration: 546 mg (12/14/2018), 399 mg (01/03/2019), 399 mg (01/24/2019)  trastuzumab-dkst (OGIVRI) 399 mg in sodium chloride 0.9 % 250 mL chemo infusion, 6 mg/kg = 399 mg (100 % of original dose 6 mg/kg), Intravenous,  Once, 14 of 14 cycles. Dose modification: 6 mg/kg (original dose 6 mg/kg, Cycle 4, Reason: Other (see comments), Comment: Biosimilar Conversion), 6 mg/kg (original dose 6 mg/kg, Cycle 15, Reason: Other (see comments), Comment: weight increase). Administration: 399 mg (02/14/2019), 399 mg (03/07/2019), 399 mg (03/28/2019), 399 mg (04/18/2019), 399 mg (05/09/2019), 399 mg (05/30/2019), 399 mg (06/20/2019), 399 mg (07/11/2019), 399 mg (08/01/2019), 399 mg (10/18/2019), 399 mg (11/07/2019), 450 mg (12/27/2019), 450 mg (01/17/2020), 450 mg (12/06/2019)   06/19/2018 Genetic Testing  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  The Multi-Gene Panel offered by [facility] includes sequencing and/or deletion duplication testing of the following 84 genes: AIP, ALK, APC, ATM, AXIN2,BAP1,  BARD1, BLM,  BMPR1A, BRCA1, BRCA2, BRIP1, CASR, CDC73, CDH1, CDK4, CDKN1B, CDKN1C, CDKN2A (p14ARF), CDKN2A (p16INK4a), CEBPA, CHEK2, CTNNA1, DICER1, DIS3L2, EGFR (c.2369C>T, p.Thr790Met variant only), EPCAM (Deletion/duplication testing only), FH, FLCN, GATA2, GPC3, GREM1 (Promoter region deletion/duplication testing only), HOXB13 (c.251G>A, p.Gly84Glu), HRAS, KIT, MAX, MEN1, MET, MITF (c.952G>A, p.Glu318Lys variant only), MLH1, MSH2, MSH3, MSH6, MUTYH, NBN, NF1, NF2, NTHL1, PALB2, PDGFRA, PHOX2B, PMS2, POLD1, POLE, POT1, PRKAR1A, PTCH1, PTEN, RAD50, RAD51C, RAD51D, RB1, RECQL4, RET, RUNX1, SDHAF2, SDHA (sequence changes only), SDHB, SDHC, SDHD, SMAD4, SMARCA4, SMARCB1, SMARCE1, STK11, SUFU, TERC, TERT, TMEM127, TP53, TSC1, TSC2, VHL, WRN and WT1.  The report date is 06/19/2018.   10/11/2018 Breast MRI   Significant decrease in the size of the right axillary adenopathy and right breast masses from 1.2cm to 0.9cm with minimal residual enhancement, and 2.4x1.4cm to 2.3x1.9cm.   11/20/2018 Surgery   Right mastectomy ([place]) ([id number]): Residual grade 2 invasive ductal carcinoma multifocal, largest 1.8 cm, intermediate grade DCIS, margins negative, lymphovascular space invasion present, 6 lymph nodes negative, ER 60%, PR 60%, HER-2 positive ratio 2.59   11/20/2018 Cancer Staging   Staging form: Breast, AJCC 8th Edition - Pathologic stage from 11/20/2018: Stage IA (pT1c, pN0, cM0, G2, ER+, PR+, HER2+)   01/01/2019 - 02/14/2019 Radiation Therapy   The patient initially received a dose of 50.4 Gy in 28 fractions to the chest wall and supraclavicular region. This was delivered using a 3-D conformal, 4 field technique. The patient then received a boost to the mastectomy scar. This delivered an additional 10 Gy in 5 fractions using an en face electron field. The total dose was 60.4 Gy.   03/2019 -  Anti-estrogen oral therapy   Tamoxifen daily     CHIEF COMPLIANT: Follow-up of right breast cancer on tamoxifen  INTERVAL  HISTORY: Miranda Ayers is a 35 y.o. with above-mentioned history of right breast cancertreated withneoadjuvant chemotherapy, right mastectomy, radiation,adjuvant Herceptin maintenance,andis currently onantiestrogen therapy with tamoxifen. She presents to the clinic todayfor follow-up.   She is tolerating tamoxifen extremely well without any major problems.  She does have hot flashes 4-5 times per day.  It sometimes interferes with her work but she is able to handle them.  ALLERGIES:  has No Known Allergies.  MEDICATIONS:  Current Outpatient Medications  Medication Sig Dispense Refill  . carvedilol (COREG) 6.25 MG tablet Take 1 tablet (6.25 mg total) by mouth 2 (two) times daily. 30 tablet 3  . fluocinolone (SYNALAR) 0.025 % cream Apply topically 3 (three) times daily. 120 g 2  . sacubitril-valsartan (ENTRESTO) 97-103 MG Take 1 tablet by mouth 2 (two) times daily. 60 tablet 3  . tamoxifen (NOLVADEX) 20 MG tablet Take 1 tablet (20 mg total) by mouth daily. 90 tablet 4   No current facility-administered medications for this visit.    PHYSICAL EXAMINATION: ECOG PERFORMANCE STATUS: 1 - Symptomatic but completely ambulatory  Vitals:   06/26/20 1007  BP: 134/89  Pulse: 66  Resp: 17  Temp: 97.6 F (36.4 C)  SpO2: 100%   Filed Weights   06/26/20 1007  Weight: 181 lb 9.6 oz (82.4 kg)    BREAST: No palpable masses or nodules in either right or left breasts. No palpable axillary supraclavicular or infraclavicular adenopathy no breast tenderness or nipple discharge. (  exam performed in the presence of a chaperone)  LABORATORY DATA:  I have reviewed the data as listed CMP Latest Ref Rng & Units 01/17/2020 12/27/2019 11/07/2019  Glucose 70 - 99 mg/dL 86 83 86  BUN 6 - 20 mg/dL 11 10 9   Creatinine 0.44 - 1.00 mg/dL 0.78 0.82 0.75  Sodium 135 - 145 mmol/L 140 139 140  Potassium 3.5 - 5.1 mmol/L 4.0 3.3(L) 3.9  Chloride 98 - 111 mmol/L 109 109 108  CO2 22 - 32 mmol/L 22 22 24   Calcium  8.9 - 10.3 mg/dL 9.6 9.1 8.9  Total Protein 6.5 - 8.1 g/dL 7.3 7.6 7.1  Total Bilirubin 0.3 - 1.2 mg/dL 0.3 0.4 0.3  Alkaline Phos 38 - 126 U/L 48 45 51  AST 15 - 41 U/L 14(L) 14(L) 16  ALT 0 - 44 U/L 12 11 12     Lab Results  Component Value Date   WBC 3.1 (L) 01/17/2020   HGB 13.0 01/17/2020   HCT 40.4 01/17/2020   MCV 89.6 01/17/2020   PLT 159 01/17/2020   NEUTROABS 1.3 (L) 01/17/2020    ASSESSMENT & PLAN:  Malignant neoplasm of upper-inner quadrant of right breast in female, estrogen receptor positive ([place]) 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%, PR 0%, HER-2 negative, Ki-67 20%; biopsy of the 4.5 cm mass came back as IDC grade 2-3, ER 50%, PR 5%, HER-2 -2+ ratio 1.6, copy #3.2, Ki-67 20%, T2N1 stage IIb  Recommendation: 1.Neoadjuvant chemotherapy with dose dense Adriamycinand Cytoxan x4 followed by Taxol weekly x11 05/09/2018-10/02/2018 2.11/20/2018:Rightmastectomy and axillary lymph node dissection 3.Since the final path was HER-2 positive she will get 1 year of Herceptincompleted 01/17/20 4.Adjuvantradiation7/14/2020-02/14/2019 5.Followed by adjuvant antiestrogen therapypatient prefers tamoxifen starting 03/28/2019 ----------------------------------------------------------- 11/20/2018:Right mastectomy: Residual grade 2 invasive ductal carcinoma multifocal, largest 1.8 cm, intermediate grade DCIS, margins negative, lymphovascular space invasion present, 0/6 lymph nodes negative, ER 60%, PR 60%, HER-2 positive ratio 2.59  Current treatment: Tamoxifen 20 mg daily 03/28/2019  Tamoxifen toxicities:4-5 times per day hot flashes manageable. Will continue this daily.  Breast cancer surveillance:  Left mammogram 12/18/2019: Benign breast density category B, prior right mastectomy Breast Exam: 06/26/20:  Benign, right chest wall scar is without any lumps or nodules.  Return to clinic in 1 year for follow-up    No orders of the defined types were placed in this encounter.  The patient has a good understanding of the overall plan. she agrees with it. she will call with any problems that may develop before the next visit here.  Total time spent: 20 mins including face to face time and time spent for planning, charting and coordination of care  [name], MD 06/26/2020  I, Cloyde Reams Dorshimer, am acting as scribe for Dr. [name].  I have reviewed the above documentation for accuracy and completeness, and I agree with the above.

## 2020-06-26 ENCOUNTER — Other Ambulatory Visit: Payer: Self-pay

## 2020-06-26 ENCOUNTER — Inpatient Hospital Stay: Payer: BC Managed Care – PPO | Attending: Adult Health | Admitting: Hematology and Oncology

## 2020-06-26 DIAGNOSIS — Z79810 Long term (current) use of selective estrogen receptor modulators (SERMs): Secondary | ICD-10-CM | POA: Insufficient documentation

## 2020-06-26 DIAGNOSIS — Z170 Estrogen receptor positive status [ER+]: Secondary | ICD-10-CM | POA: Diagnosis not present

## 2020-06-26 DIAGNOSIS — C50211 Malignant neoplasm of upper-inner quadrant of right female breast: Secondary | ICD-10-CM | POA: Insufficient documentation

## 2020-06-26 DIAGNOSIS — N951 Menopausal and female climacteric states: Secondary | ICD-10-CM | POA: Diagnosis not present

## 2020-08-17 ENCOUNTER — Encounter (HOSPITAL_COMMUNITY): Payer: BC Managed Care – PPO | Admitting: Internal Medicine

## 2020-09-22 ENCOUNTER — Other Ambulatory Visit (HOSPITAL_COMMUNITY): Payer: Self-pay | Admitting: Internal Medicine

## 2020-10-08 ENCOUNTER — Other Ambulatory Visit: Payer: Self-pay

## 2020-10-08 ENCOUNTER — Encounter (HOSPITAL_COMMUNITY): Payer: Self-pay | Admitting: Internal Medicine

## 2020-10-08 ENCOUNTER — Ambulatory Visit (HOSPITAL_COMMUNITY)
Admission: RE | Admit: 2020-10-08 | Discharge: 2020-10-08 | Disposition: A | Discharge status: Home / Self Care | Payer: BC Managed Care – PPO | Source: Ambulatory Visit | Attending: Internal Medicine | Admitting: Internal Medicine

## 2020-10-08 VITALS — BP 140/88 | HR 82 | Wt 187.2 lb

## 2020-10-08 DIAGNOSIS — I110 Hypertensive heart disease with heart failure: Secondary | ICD-10-CM | POA: Diagnosis present

## 2020-10-08 DIAGNOSIS — R0683 Snoring: Secondary | ICD-10-CM | POA: Diagnosis not present

## 2020-10-08 DIAGNOSIS — C50211 Malignant neoplasm of upper-inner quadrant of right female breast: Secondary | ICD-10-CM | POA: Diagnosis not present

## 2020-10-08 DIAGNOSIS — G4719 Other hypersomnia: Secondary | ICD-10-CM

## 2020-10-08 DIAGNOSIS — T451X5A Adverse effect of antineoplastic and immunosuppressive drugs, initial encounter: Secondary | ICD-10-CM | POA: Insufficient documentation

## 2020-10-08 DIAGNOSIS — I429 Cardiomyopathy, unspecified: Secondary | ICD-10-CM | POA: Diagnosis not present

## 2020-10-08 DIAGNOSIS — Z79899 Other long term (current) drug therapy: Secondary | ICD-10-CM | POA: Insufficient documentation

## 2020-10-08 DIAGNOSIS — I10 Essential (primary) hypertension: Secondary | ICD-10-CM

## 2020-10-08 DIAGNOSIS — Z170 Estrogen receptor positive status [ER+]: Secondary | ICD-10-CM

## 2020-10-08 DIAGNOSIS — I5022 Chronic systolic (congestive) heart failure: Secondary | ICD-10-CM

## 2020-10-08 DIAGNOSIS — E669 Obesity, unspecified: Secondary | ICD-10-CM | POA: Diagnosis not present

## 2020-10-08 DIAGNOSIS — I509 Heart failure, unspecified: Secondary | ICD-10-CM | POA: Insufficient documentation

## 2020-10-08 HISTORY — DX: Heart failure, unspecified: I50.9

## 2020-10-08 MED ORDER — SPIRONOLACTONE 25 MG PO TABS
12.5000 mg | ORAL_TABLET | Freq: Every day | ORAL | 6 refills | Status: DC
Start: 2020-10-08 — End: 2021-07-26

## 2020-10-08 NOTE — Progress Notes (Signed)
CARDIO-ONCOLOGY CLINIC NOTE HPI:  Miranda Ayers is 35 y.o. female with h/o obesity, HTN and breast cancer referred by Dr. Lindi Adie for enrollment into the Cardio-Oncology program due to reduced EF  SUMMARY OF ONCOLOGIC HISTORY:    Oncology History  Malignant neoplasm of upper-inner quadrant of right breast in female, estrogen receptor positive ([place])  04/17/2018 Initial Diagnosis   Palpable right breast masses UOQ and UIQ with multiple masses 12 to 14 cm, at 1:00 1.6 cm, 1231.8 cm, and o'clock 4.5 cm and at 9:30 position 5 cm: Biopsy revealed IDC with DCIS, grade 3, ER 80%, PR 0%, HER-2 negative, Ki-67 20%; biopsy of the 4.5 cm mass came back as IDC grade 2-3, ER 50%, PR 5%, HER-2 -2+ ratio 1.6, copy #3.2, Ki-67 20%, T2N1 stage IIb   05/09/2018 - 10/08/2018 Neo-Adjuvant Chemotherapy   Neoadjuvant chemotherapy with dose dense Adriamycinand Cytoxan x4 followed by Taxol weekly x12   06/19/2018 Genetic Testing   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 The Multi-Gene Panel offered by [facility] includes sequencing and/or deletion duplication testing of the following 84 genes: AIP, ALK, APC, ATM, AXIN2,BAP1, BARD1, BLM, BMPR1A, BRCA1, BRCA2, BRIP1, CASR, CDC73, CDH1, CDK4, CDKN1B, CDKN1C, CDKN2A (p14ARF), CDKN2A (p16INK4a), CEBPA, CHEK2, CTNNA1, DICER1, DIS3L2, EGFR (c.2369C>T, p.Thr790Met variant only), EPCAM (Deletion/duplication testing only), FH, FLCN, GATA2, GPC3, GREM1 (Promoter region deletion/duplication testing only), HOXB13 (c.251G>A, p.Gly84Glu), HRAS, KIT, MAX, MEN1, MET, MITF (c.952G>A, p.Glu318Lys variant only), MLH1, MSH2, MSH3, MSH6, MUTYH, NBN, NF1, NF2, NTHL1, PALB2, PDGFRA, PHOX2B, PMS2, POLD1, POLE, POT1, PRKAR1A, PTCH1, PTEN, RAD50, RAD51C, RAD51D, RB1, RECQL4, RET, RUNX1, SDHAF2, SDHA (sequence changes only), SDHB, SDHC, SDHD, SMAD4, SMARCA4, SMARCB1, SMARCE1, STK11, SUFU, TERC, TERT, TMEM127, TP53, TSC1, TSC2, VHL, WRN and WT1. The report  date is 06/19/2018.   10/11/2018 Breast MRI   Significant decrease in the size of the right axillary adenopathy and right breast masses from 1.2cm to 0.9cm with minimal residual enhancement, and 2.4x1.4cm to 2.3x1.9cm.   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, largest 1.8 cm, intermediate grade DCIS, margins negative, lymphovascular space invasion present, 0/6 lymph nodes negative, ER 60%, PR 60%, HER-2 positive ratio 2.59   11/20/2018 Cancer Staging   Staging form: Breast, AJCC 8th Edition - Pathologic stage from 11/20/2018: Stage IA (pT1c, pN0, cM0, G2, ER+, PR+, HER2+) - Signed by [name], NP on 12/05/2018   12/14/2018 -  Chemotherapy   The patient had trastuzumab (HERCEPTIN) 546 mg in sodium chloride 0.9 % 250 mL chemo infusion, 8 mg/kg = 546 mg, Intravenous, Once, 3 of 3 cycles Administration: 546 mg (12/14/2018), 399 mg (01/03/2019), 399 mg (01/24/2019) trastuzumab-dkst (OGIVRI) 399 mg in sodium chloride 0.9 % 250 mL chemo infusion, 6 mg/kg = 399 mg (100 % of original dose 6 mg/kg), Intravenous, Once, 8 of 14 cycles Dose modification: 6 mg/kg (original dose 6 mg/kg, Cycle 4, Reason: Other (see comments), Comment: Biosimilar Conversion) Administration: 399 mg (02/14/2019), 399 mg (03/07/2019), 399 mg (03/28/2019), 399 mg (04/18/2019), 399 mg (05/09/2019), 399 mg (05/30/2019), 399 mg (06/20/2019), 399 mg (07/11/2019) for chemotherapy treatment.    01/01/2019 - 02/14/2019 Radiation Therapy    She denies any h/o of known heart disease. Has had uncontrolled HTN. Found to have R breast CA in 11/19. Treated with adriamycin based regimen. Did fine. Post adriamycin echo was normal. Has mastectomy and found to have residual tumor in R breast which was HER-2+ . Started on  Herceptin in 6/20. Echo 10/20 EF 55-60%.  Echo 08/05/19 showwed EF 40-45%.  - adriamycin completed 4/20  - herceptin started 12/14/18  - herceptin held 3/21 -  herceptin restarted in 4/21 - herceptin completed 7/21  Here for routine f/u. Doing well. 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 Denies CP, SOB, edema, orthopnea or PND. Takes all meds as prescribed. Concerned about gaining weight. Mother says she snores.   Echo 04/17/20 EF 50-55%  Cardiac studies:  Echo 01/16/20 EF 50-55% Personally reviewed Echo 4/21 EF 50-55% GLS - 17.2% Echo 6/20 EF 55-60% FLS -14.5% Echo 10/20 EF 55-60% GLS -18 Echo 08/05/19 EF 40-45% GLS -15   Past Medical History:  Diagnosis Date  . Cancer [facility])    breast cancer  . CHF (congestive heart failure) ([place])   . Family history of colon cancer   . Family history of lung cancer   . Family history of pancreatic cancer   . Hypertension     Current Outpatient Medications  Medication Sig Dispense Refill  . carvedilol (COREG) 6.25 MG tablet Take 1 tablet by mouth twice daily 60 tablet 0  . sacubitril-valsartan (ENTRESTO) 97-103 MG Take 1 tablet by mouth 2 (two) times daily. 60 tablet 3  . tamoxifen (NOLVADEX) 20 MG tablet Take 1 tablet (20 mg total) by mouth daily. 90 tablet 4   No current facility-administered medications for this encounter.    No Known Allergies    Social History   Socioeconomic History  . Marital status: Single    Spouse name: Not on file  . Number of children: Not on file  . Years of education: Not on file  . Highest education level: Not on file  Occupational History  . Not on file  Tobacco Use  . Smoking status: Never Smoker  . Smokeless tobacco: Never Used  Vaping Use  . Vaping Use: Never used  Substance and Sexual Activity  . Alcohol use: Never  . Drug use: Never  . Sexual activity: Not Currently    Birth control/protection: None  Other Topics Concern  . Not on file  Social History Narrative  . Not on file   Social Determinants of Health   Financial Resource Strain: Not on file  Food Insecurity: Not on file  Transportation Needs: Not on file  Physical Activity: Not on  file  Stress: Not on file  Social Connections: Not on file  Intimate Partner Violence: Not on file      Family History  Problem Relation Age of Onset  . Lung cancer Maternal Grandmother   . Lung cancer Maternal Grandfather   . Colon cancer Maternal Grandfather   . Stomach cancer Paternal Grandfather   . Lung cancer Maternal Aunt   . Pancreatic cancer Maternal Aunt   . Lung cancer Maternal Uncle   . Cancer Cousin        childhood cancer in mat first cousin    Vitals:   10/08/20 1109  BP: 140/88  Pulse: 82  SpO2: 100%  Weight: 84.9 kg (187 lb 3.2 oz)   Filed Weights   10/08/20 1109  Weight: 84.9 kg (187 lb 3.2 oz)    PHYSICAL EXAM: General:  Well appearing. No resp difficulty HEENT: normal Neck: supple. no JVD. Carotids 2+ bilat; no bruits. No lymphadenopathy or thryomegaly appreciated. Cor: PMI nondisplaced. Regular rate & rhythm. No rubs, gallops or murmurs. Lungs: clear Abdomen: obese soft, nontender, nondistended. No hepatosplenomegaly. No bruits or masses. Good bowel sounds. Extremities: no cyanosis, clubbing, rash,  edema Neuro: alert & orientedx3, cranial nerves grossly intact. moves all 4 extremities w/o difficulty. Affect pleasant  ECG: NSR 66 No ST-T wave abnormalities. Personally reviewed  ASSESSMENT & PLAN:  1. 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 Uncontrolled HTN may also be playing a role - Herceptin held in March 2021. Restarted 4/21. Finished 7/21 - EF 7/21 stable at 50-55%. I suspect she has mild anthracycline toxicity. (vs HTN related). - Echo 04/17/20 EF 50-55% - NYHA I volume status ok  - Continue carvedilol 6.25 bid - Continue Entresto 97/103 bid - Add spiro 12.5 daily  2. HTN - Blood pressure elevated - Add spiro - needs sleep study   3. Breast Cancer - as above s/p adriamycin and Herceptin   4. Obesity - discussed need for  weight loss with [place]   5. Snoring - order sleep study   Glori Bickers, MD  11:54 AM

## 2020-10-08 NOTE — Progress Notes (Signed)
Height: 5'1"    Weight: 187 lb BMI: 35.37  Today's Date: 10/08/20  STOP BANG RISK ASSESSMENT S (snore) Have you been told that you snore?     YES   T (tired) Are you often tired, fatigued, or sleepy during the day?   YES  O (obstruction) Do you stop breathing, choke, or gasp during sleep? NO   P (pressure) Do you have or are you being treated for high blood pressure? YES   B (BMI) Is your body index greater than 35 kg/m? YES   A (age) Are you 36 years old or older? NO   N (neck) Do you have a neck circumference greater than 16 inches?      G (gender) Are you a female? NO   TOTAL STOP/BANG "YES" ANSWERS 4                                                                       For Office Use Only              Procedure Order Form    YES to 3+ Stop Bang questions OR two clinical symptoms - patient qualifies for WatchPAT (CPT 95800)      Clinical Notes: Will consult Sleep Specialist and refer for management of therapy due to patient increased risk of Sleep Apnea. Ordering a sleep study due to the following two clinical symptoms: Excessive daytime sleepiness G47.10 / Loud snoring R06.83

## 2020-10-08 NOTE — Addendum Note (Signed)
Encounter addended by: Scarlette Calico, RN on: 10/08/2020 4:00 PM  Actions taken: Clinical Note Signed

## 2020-10-08 NOTE — Patient Instructions (Signed)
Start Spironolactone 12.5 mg (1/2 tab) Daily  Your physician has requested that you have an echocardiogram. Echocardiography is a painless test that uses sound waves to create images of your heart. It provides your doctor with information about the size and shape of your heart and how well your heart's chambers and valves are working. This procedure takes approximately one hour. There are no restrictions for this procedure.  Your provider has recommended that you have a home sleep study.  We have provided you with the equipment in our office today. DO NOT do the test until we call and let you know to do so once insurance has approved it.Once approved please download the app and follow the instructions. YOUR PIN NUMBER IS: [id number]. Once you have completed the test you just dispose of the equipment, the information is automatically uploaded to [place] via blue-tooth technology. If your test is positive for sleep apnea and you need a home CPAP machine you will be contacted by Dr Theodosia Blender office [facility]) to set this up.  Your physician encouraged you to lose weight for better health, please consider [place]  Please call our office in September to schedule your follow up appointment  If you have any questions or concerns before your next appointment please send [place] a message through [place] or call our office at [phone].    TO LEAVE A MESSAGE FOR THE NURSE SELECT OPTION 2, PLEASE LEAVE A MESSAGE INCLUDING: . YOUR NAME . DATE OF BIRTH . CALL BACK NUMBER . REASON FOR CALL**this is important as we prioritize the call backs  [place] AS LONG AS YOU CALL BEFORE 4:00 PM  At the [facility], you and your health needs are our priority. As part of our continuing mission to provide you with exceptional heart care, we have created designated Provider Care Teams. These Care Teams include your primary Cardiologist (physician) and Advanced Practice  Providers (APPs- Physician Assistants and Nurse Practitioners) who all work together to provide you with the care you need, when you need it.   You may see any of the following providers on your designated Care Team at your next follow up: Marland Kitchen Dr Glori Bickers . Dr Loralie Champagne . Dr Vickki Muff . Darrick Grinder, NP . Lyda Jester, [place] . Audry Riles, PharmD   Please be sure to bring in all your medications bottles to every appointment.

## 2020-10-08 NOTE — Addendum Note (Signed)
Encounter addended by: Scarlette Calico, RN on: 10/08/2020 12:12 PM  Actions taken: Visit diagnoses modified, Order list changed, Diagnosis association updated, Clinical Note Signed

## 2020-10-19 ENCOUNTER — Other Ambulatory Visit: Payer: Self-pay

## 2020-10-19 ENCOUNTER — Ambulatory Visit: Payer: BC Managed Care – PPO | Attending: Surgery

## 2020-10-19 DIAGNOSIS — M25611 Stiffness of right shoulder, not elsewhere classified: Secondary | ICD-10-CM | POA: Insufficient documentation

## 2020-10-19 DIAGNOSIS — Z483 Aftercare following surgery for neoplasm: Secondary | ICD-10-CM | POA: Insufficient documentation

## 2020-10-19 DIAGNOSIS — M6281 Muscle weakness (generalized): Secondary | ICD-10-CM | POA: Diagnosis present

## 2020-10-19 DIAGNOSIS — R293 Abnormal posture: Secondary | ICD-10-CM | POA: Insufficient documentation

## 2020-10-19 NOTE — Therapy (Signed)
[place], [place], [address] Phone: [phone]   Fax:  [phone]  Physical Therapy Evaluation  Patient Details  Name: Miranda Ayers MRN: [id number] Date of Birth: [birth year] Referring Provider (PT): Dr. Brantley Stage   Encounter Date: 10/19/2020   PT End of Session - 10/19/20 1008    Visit Number 1    Number of Visits 12    Date for PT Re-Evaluation 11/30/20    PT Start Time 0908    PT Stop Time 0953    PT Time Calculation (min) 45 min    Activity Tolerance Patient tolerated treatment well    Behavior During Therapy [facility] for tasks assessed/performed           Past Medical History:  Diagnosis Date  . Cancer [facility])    breast cancer  . CHF (congestive heart failure) ([place])   . Family history of colon cancer   . Family history of lung cancer   . Family history of pancreatic cancer   . Hypertension     Past Surgical History:  Procedure Laterality Date  . MASTECTOMY WITH RADIOACTIVE SEED GUIDED EXCISION AND AXILLARY SENTINEL LYMPH NODE BIOPSY Right 11/20/2018   Procedure: RIGHT SIMPLE MASTECTOMY WITH RIGHT RADIOACTIVE SEED TARGETED LYMPH NODE BIOPSY AND RIGHT  AXILLARY SENTINEL LYMPH NODE MAPPING AND PORT REMOVEL RIGHT CHEST;  Surgeon: [name], MD;  Location: [place];  Service: General;  Laterality: Right;  . OOPHORECTOMY  2013  . PORTACATH PLACEMENT N/A 05/08/2018   Procedure: INSERTION PORT-A-CATH WITH ULTRASOUND;  Surgeon: [name], MD;  Location: [place];  Service: General;  Laterality: N/A;    There were no vitals filed for this visit.    Subjective Assessment - 10/19/20 0911    Subjective If I sleep a certain way or with certain movements I feel a catch at the lateral trunk, and it feels really tight.  Somtimes if I move wrong it takes my breath.  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  Doesn't feel much tightness under her arm.   Hasn't had pain in a few months, but she tries to avoid certain movements. When she gets the pain it may last the entire day or 2.  Pushing with the arm , and twisting also increases pain at the rib cage.    Pertinent History 11/20/18- right mastectomy for treatment of right breast cancer as well as SLNB (2 of 4 nodes positive), pt had chemo and radiation.    How long can you sit comfortably? no limitation    How long can you stand comfortably? as long as she needs to    How long can you walk comfortably? no limit    Patient Stated Goals Get rid of the right rib/ lateral trunk pain. Be able to push, pull. twist without pain    Currently in Pain? No/denies    Pain Score 0-No pain              [facility] PT Assessment - 10/19/20 0001      Assessment   Medical Diagnosis right breast cancer    Referring Provider (PT) Dr. Brantley Stage    Onset Date/Surgical Date 11/20/18    Hand Dominance Right    Prior Therapy yes      Precautions   Precaution Comments at risk of lymphedema      Restrictions   Weight Bearing Restrictions No  Balance Screen   Has the patient fallen in the past 6 months No    Has the patient had a decrease in activity level because of a fear of falling?  No    Is the patient reluctant to leave their home because of a fear of falling?  No      Home Ecologist residence    Transport planner;Children    Available Help at Discharge Family    Type of Home House      Prior Function   Level of Independence Independent    Vocation Full time employment    Vocation Requirements supervising at [employer]    Leisure 3x wk pt walks for 1-2 miles      Cognition   Overall Cognitive Status Within Functional Limits for tasks assessed      Observation/Other Assessments   Skin Integrity WNL      AROM   Right Shoulder Flexion 167 Degrees    Right Shoulder ABduction 180 Degrees    Right  Shoulder Internal Rotation 65 Degrees    Right Shoulder External Rotation 98 Degrees    Left Shoulder Flexion 167 Degrees    Left Shoulder ABduction 180 Degrees    Left Shoulder Internal Rotation 67 Degrees    Left Shoulder External Rotation 105 Degrees    Lumbar Flexion WNL    Lumbar Extension WNL    Lumbar - Right Side Bend WNL    Lumbar - Left Side Bend WNL    Lumbar - Right Rotation mildly decreased with pain reproduced right anterior rib cage distal to incision    Lumbar - Left Rotation WNL   got a pain in left anterior rib cage that resolved quickly but is in same general area as right sided complaints     Palpation   Palpation comment tender at drain site                      Objective measurements completed on examination: See above findings.                    PT Long Term Goals - 10/19/20 1019      PT LONG TERM GOAL #1   Title Pt will be independent in HEP for postural strengthening and trunk ROM/strength    Time 4    Period Weeks    Status New    Target Date 11/16/20      PT LONG TERM GOAL #2   Title pt will have full trunk rotation bilaterally without increased pain    Time 6    Period Weeks    Status New    Target Date 11/30/20      PT LONG TERM GOAL #3   Title Pt will have 75% decrease in right rib symptoms/pain    Time 6    Period Weeks    Status New    Target Date 11/30/20      PT LONG TERM GOAL #4   Title Pt will be able to perform home and work activities without producing right rib pain    Time 6    Period Weeks    Status New    Target Date 11/30/20                  Plan - 10/19/20 1009    Clinical Impression Statement pt is complaining of intermittent right anterior rib pain just distal to  her incision and in region of drain scar.  Shoulder ROM is WNL without complaint.  Pain is brought on with alot of activity at work especially with pushing, pulling, twisting and pain can reach high levels.  She does not  do anything for it.  She was advised not to try and work through it.  Lumbar ROM was WNL but rotation in each direction did increase pain on the ipsalateral side that resolved quickly.  Right rotation was mildly limited secondary to fear of causing pain.  Pain is only in the anterior rib region and not in the thoracic region at all.  She is tender at the drain incision site on the right.  She was given a chip pack for the drain region, and was also instructed to massage the drain scar to desensitize it. She was instructed to try gentle ice to the area when she gets home from work, and to consider calling or messaging her MD to see if she might benefit from an antiinflammatory if symptoms are bad.    Personal Factors and Comorbidities Profession    Examination-Activity Limitations Reach Overhead;Other   pushing, twisting   Stability/Clinical Decision Making Stable/Uncomplicated    Clinical Decision Making Low    Rehab Potential Good    PT Frequency 2x / week    PT Duration 6 weeks    PT Treatment/Interventions Therapeutic activities;Therapeutic exercise;Neuromuscular re-education;Manual techniques;Patient/family education;Scar mobilization    PT Next Visit Plan MFR/STM to lateral trunk and right rib are and drain site, postural TB and start to add in 1 sided pushing, pulling,gentle rotation as tolerated    PT Home Exercise Plan scar massage to drain site    Consulted and Agree with Plan of Care Patient           Patient will benefit from skilled therapeutic intervention in order to improve the following deficits and impairments:  Pain,Impaired UE functional use,Decreased activity tolerance,Decreased scar mobility,Postural dysfunction  Visit Diagnosis: Abnormal posture  Aftercare following surgery for neoplasm  Muscle weakness (generalized)     Problem List Patient Active Problem List   Diagnosis Date Noted  . Neutropenia, drug-induced ([place]) 08/20/2018  . Genetic testing 06/22/2018  .  Family history of lung cancer   . Family history of colon cancer   . Family history of pancreatic cancer   . Malignant neoplasm of upper-inner quadrant of right breast in female, estrogen receptor positive ([place]) 04/19/2018    Claris Pong 10/19/2020, 10:24 AM  [place], [place], [address] Phone: [phone]   Fax:  [phone]  Name: Miranda Ayers MRN: [id number] Date of Birth: [birth year] Cheral Almas, PT 10/19/20 10:27 AM

## 2020-10-30 ENCOUNTER — Ambulatory Visit: Payer: BC Managed Care – PPO

## 2020-10-30 ENCOUNTER — Other Ambulatory Visit: Payer: Self-pay

## 2020-10-30 DIAGNOSIS — R293 Abnormal posture: Secondary | ICD-10-CM | POA: Diagnosis not present

## 2020-10-30 DIAGNOSIS — Z483 Aftercare following surgery for neoplasm: Secondary | ICD-10-CM

## 2020-10-30 DIAGNOSIS — M6281 Muscle weakness (generalized): Secondary | ICD-10-CM

## 2020-10-30 DIAGNOSIS — M25611 Stiffness of right shoulder, not elsewhere classified: Secondary | ICD-10-CM

## 2020-10-30 NOTE — Patient Instructions (Signed)
Access Code: [email or web address] URL: [email or web address] Date: 10/30/2020 Prepared by: Cheral Almas  Exercises Scapular Retraction with Resistance - 1 x daily - 3 x weekly - 1 sets - 10 reps Scapular Retraction with Resistance Advanced - 1 x daily - 3 x weekly - 1 sets - 10 reps Shoulder External Rotation and Scapular Retraction with Resistance - 1 x daily - 3 x weekly - 1 sets - 10 reps Drawing Bow - 1 x daily - 7 x weekly - 1 sets - 10 reps Standing Serratus Punch with Resistance - 1 x daily - 3 x weekly - 1 sets - 10 reps Supine Posterior Pelvic Tilt - 1 x daily - 7 x weekly - 1 sets - 10 reps Supine Hip Adduction Isometric with Ball - 1 x daily - 7 x weekly - 1 sets - 10 reps Quadruped Full Range Thoracic Rotation with Reach - 1 x daily - 7 x weekly - 5 reps - 5 hold Hooklying Clamshell with Resistance - 1 x daily - 7 x weekly - 1 sets - 10 reps Supine Lower Trunk Rotation - 1 x daily - 7 x weekly - 1 sets - 5 reps

## 2020-10-30 NOTE — Therapy (Signed)
[place], [place], [address] Phone: [phone]   Fax:  [phone]  Physical Therapy Treatment  Patient Details  Name: Miranda Ayers MRN: [id number] Date of Birth: 02-06-1986 Referring Provider (PT): Dr. Brantley Stage   Encounter Date: 10/30/2020   PT End of Session - 10/30/20 0953    Visit Number 2    Number of Visits 12    Date for PT Re-Evaluation 11/30/20    PT Start Time 0903    PT Stop Time 0952    PT Time Calculation (min) 49 min    Activity Tolerance Patient tolerated treatment well    Behavior During Therapy [facility] for tasks assessed/performed           Past Medical History:  Diagnosis Date  . Cancer [facility])    breast cancer  . CHF (congestive heart failure) ([place])   . Family history of colon cancer   . Family history of lung cancer   . Family history of pancreatic cancer   . Hypertension     Past Surgical History:  Procedure Laterality Date  . MASTECTOMY WITH RADIOACTIVE SEED GUIDED EXCISION AND AXILLARY SENTINEL LYMPH NODE BIOPSY Right 11/20/2018   Procedure: RIGHT SIMPLE MASTECTOMY WITH RIGHT RADIOACTIVE SEED TARGETED LYMPH NODE BIOPSY AND RIGHT  AXILLARY SENTINEL LYMPH NODE MAPPING AND PORT REMOVEL RIGHT CHEST;  Surgeon: [name], MD;  Location: [place];  Service: General;  Laterality: Right;  . OOPHORECTOMY  2013  . PORTACATH PLACEMENT N/A 05/08/2018   Procedure: INSERTION PORT-A-CATH WITH ULTRASOUND;  Surgeon: [name], MD;  Location: [place];  Service: General;  Laterality: N/A;    There were no vitals filed for this visit.   Subjective Assessment - 10/30/20 0902    Subjective Did fine after last visit. The day before yesterday I felt like it was going to come back but it didn't.  Nopresent pain    Pertinent History 11/20/18- right mastectomy for treatment of right breast cancer as well as SLNB (2 of 4 nodes positive), pt had chemo and  radiation.    How long can you sit comfortably? no limitation    How long can you stand comfortably? as long as she needs to    How long can you walk comfortably? no limit    Patient Stated Goals Get rid of the right rib/ lateral trunk pain. Be able to push, pull. twist without pain    Currently in Pain? No/denies    Pain Score 0-No pain                             [facility] Adult PT Treatment/Exercise - 10/30/20 0001      Lumbar Exercises: Supine   Ab Set 10 reps   with pillow squeeze x 10   Pelvic Tilt 10 reps;5 seconds    Clam 10 reps   red band   Straight Leg Raise 5 reps   ea leg   Other Supine Lumbar Exercises ppt with horizontal abd x 10 yellow.    Other Supine Lumbar Exercises LTR x 5 ea      Lumbar Exercises: Quadruped   Single Arm Raise Right;Left;5 reps    Other Quadruped Lumbar Exercises opp leg raise 5 reps    Other Quadruped Lumbar Exercises thoracic rotation with single arm raise   alternate sides.     Shoulder Exercises: Standing   Protraction Strengthening;Right;Left;10 reps  Theraband Level (Shoulder Protraction) Level 1 (Yellow)    External Rotation Strengthening;Both;10 reps    Theraband Level (Shoulder External Rotation) Level 1 (Yellow)    Extension Strengthening;Both;10 reps    Retraction Strengthening;Both;10 reps    Theraband Level (Shoulder Retraction) Level 1 (Yellow)    Other Standing Exercises draw the arrow x 10 ea, yellow                  PT Education - 10/30/20 0952    Education Details Pt was educated in Museum/gallery curator and core strength with starting to build in gentle rotation    Person(s) Educated Patient    Methods Explanation;Handout    Comprehension Returned demonstration               PT Long Term Goals - 10/19/20 1019      PT LONG TERM GOAL #1   Title Pt will be independent in HEP for postural strengthening and trunk ROM/strength    Time 4    Period Weeks    Status New    Target Date 11/16/20       PT LONG TERM GOAL #2   Title pt will have full trunk rotation bilaterally without increased pain    Time 6    Period Weeks    Status New    Target Date 11/30/20      PT LONG TERM GOAL #3   Title Pt will have 75% decrease in right rib symptoms/pain    Time 6    Period Weeks    Status New    Target Date 11/30/20      PT LONG TERM GOAL #4   Title Pt will be able to perform home and work activities without producing right rib pain    Time 6    Period Weeks    Status New    Target Date 11/30/20                 Plan - 10/30/20 0953    Clinical Impression Statement Therapy consisted of thoracic, shoulder lumbar and core strength in standing, supine and quadruped while starting to incorporate gentle thoracic rotation.  Pt did exceptionally well return demonstrating all exercises and maintaining pelvic tilt during core stabs.  She did fatigue easily with ppt with SLR and with quad exs. She felt mild stretch with lower trunk rotation, but had no increased complaints of pain    Personal Factors and Comorbidities Profession    Examination-Activity Limitations Reach Overhead;Other    Stability/Clinical Decision Making Stable/Uncomplicated    Rehab Potential Good    PT Frequency 2x / week    PT Duration 6 weeks    PT Treatment/Interventions Therapeutic activities;Therapeutic exercise;Neuromuscular re-education;Manual techniques;Patient/family education;Scar mobilization    PT Next Visit Plan Cont thoracic, lumbar, core strength building in rotation, foam roller for thoracic,consider STM prn to lateral trunk and right rib area prn    PT Home Exercise Plan exs instructed today for shouder/thoracic core strength    Consulted and Agree with Plan of Care Patient           Patient will benefit from skilled therapeutic intervention in order to improve the following deficits and impairments:  Pain,Impaired UE functional use,Decreased activity tolerance,Decreased scar mobility,Postural  dysfunction  Visit Diagnosis: Abnormal posture  Aftercare following surgery for neoplasm  Muscle weakness (generalized)  Stiffness of right shoulder, not elsewhere classified     Problem List Patient Active Problem List   Diagnosis Date Noted  .  Neutropenia, drug-induced ([name]) 08/20/2018  . Genetic testing 06/22/2018  . Family history of lung cancer   . Family history of colon cancer   . Family history of pancreatic cancer   . Malignant neoplasm of upper-inner quadrant of right breast in female, estrogen receptor positive ([place]) 04/19/2018    Claris Pong 10/30/2020, 9:59 AM  [place], [place], [address] Phone: [phone]   Fax:  [phone]  Name: Miranda Ayers MRN: [id number] Date of Birth: [birth year]  Cheral Almas, PT 10/30/20 10:00 AM

## 2020-11-02 ENCOUNTER — Other Ambulatory Visit: Payer: Self-pay

## 2020-11-02 ENCOUNTER — Ambulatory Visit: Payer: BC Managed Care – PPO

## 2020-11-02 DIAGNOSIS — R293 Abnormal posture: Secondary | ICD-10-CM | POA: Diagnosis not present

## 2020-11-02 DIAGNOSIS — M6281 Muscle weakness (generalized): Secondary | ICD-10-CM

## 2020-11-02 DIAGNOSIS — Z483 Aftercare following surgery for neoplasm: Secondary | ICD-10-CM

## 2020-11-02 DIAGNOSIS — M25611 Stiffness of right shoulder, not elsewhere classified: Secondary | ICD-10-CM

## 2020-11-02 NOTE — Therapy (Signed)
[place], [place], [address] Phone: [phone]   Fax:  [phone]  Physical Therapy Treatment  Patient Details  Name: Miranda Ayers MRN: [id number] Date of Birth: [birth year] Referring Provider (PT): Dr. Brantley Stage   Encounter Date: 11/02/2020   PT End of Session - 11/02/20 1058    Visit Number 3    Number of Visits 12    Date for PT Re-Evaluation 11/30/20    PT Start Time 1006    PT Stop Time 1053    PT Time Calculation (min) 47 min    Activity Tolerance Patient tolerated treatment well    Behavior During Therapy [facility] for tasks assessed/performed           Past Medical History:  Diagnosis Date  . Cancer [facility])    breast cancer  . CHF (congestive heart failure) ([place])   . Family history of colon cancer   . Family history of lung cancer   . Family history of pancreatic cancer   . Hypertension     Past Surgical History:  Procedure Laterality Date  . MASTECTOMY WITH RADIOACTIVE SEED GUIDED EXCISION AND AXILLARY SENTINEL LYMPH NODE BIOPSY Right 11/20/2018   Procedure: RIGHT SIMPLE MASTECTOMY WITH RIGHT RADIOACTIVE SEED TARGETED LYMPH NODE BIOPSY AND RIGHT  AXILLARY SENTINEL LYMPH NODE MAPPING AND PORT REMOVEL RIGHT CHEST;  Surgeon: [name], MD;  Location: [place];  Service: General;  Laterality: Right;  . OOPHORECTOMY  2013  . PORTACATH PLACEMENT N/A 05/08/2018   Procedure: INSERTION PORT-A-CATH WITH ULTRASOUND;  Surgeon: [name], MD;  Location: [place];  Service: General;  Laterality: N/A;    There were no vitals filed for this visit.   Subjective Assessment - 11/02/20 1007    Subjective Did well after last visit but I had a little muscle soreness. No problems with the trunk pain over the weekend. Got her drivrs license for the first time this weekend.  She was concerned about the turning necessary to drive, but she has been able to without  increased pain in the rib area. No present pain    Pertinent History 11/20/18- right mastectomy for treatment of right breast cancer as well as SLNB (2 of 4 nodes positive), pt had chemo and radiation.    Patient Stated Goals Get rid of the right rib/ lateral trunk pain. Be able to push, pull. twist without pain    Currently in Pain? No/denies    Pain Score 0-No pain                             [facility] Adult PT Treatment/Exercise - 11/02/20 0001      Lumbar Exercises: Supine   Ab Set 10 reps   with pillow squeeze x 10   Straight Leg Raise 10 reps   5 in each set   Large Ball Oblique Isometric Other (comment);10 reps   no ball in supine with opposite hand to knee   Other Supine Lumbar Exercises LTR x 5 ea      Lumbar Exercises: Quadruped   Opposite Arm/Leg Raise Right arm/Left leg;Left arm/Right leg;5 reps    Other Quadruped Lumbar Exercises childs pose x 3   15 sec   Other Quadruped Lumbar Exercises thoracic rotation with single arm raise   alternate sides.     Shoulder Exercises: Supine   Horizontal ABduction Strengthening;Both;10 reps   with ppt in supine  Theraband Level (Shoulder Horizontal ABduction) Level 1 (Yellow)    Diagonals Strengthening;Right;Left;10 reps    Theraband Level (Shoulder Diagonals) Level 1 (Yellow)      Shoulder Exercises: Standing   Protraction Strengthening;Right;Left;12 reps    Theraband Level (Shoulder Protraction) Level 1 (Yellow)    External Rotation Strengthening;Both;12 reps    Theraband Level (Shoulder External Rotation) Level 1 (Yellow)    Extension Strengthening;Both;12 reps    Theraband Level (Shoulder Extension) Level 1 (Yellow)    Retraction Strengthening;Both;10 reps   and 10 with alternating arms   Theraband Level (Shoulder Retraction) Level 1 (Yellow)    Other Standing Exercises draw the arrow x 12 yellow band    Other Standing Exercises wall push ups x 10                       PT Long Term Goals - 10/19/20  1019      PT LONG TERM GOAL #1   Title Pt will be independent in HEP for postural strengthening and trunk ROM/strength    Time 4    Period Weeks    Status New    Target Date 11/16/20      PT LONG TERM GOAL #2   Title pt will have full trunk rotation bilaterally without increased pain    Time 6    Period Weeks    Status New    Target Date 11/30/20      PT LONG TERM GOAL #3   Title Pt will have 75% decrease in right rib symptoms/pain    Time 6    Period Weeks    Status New    Target Date 11/30/20      PT LONG TERM GOAL #4   Title Pt will be able to perform home and work activities without producing right rib pain    Time 6    Period Weeks    Status New    Target Date 11/30/20                 Plan - 11/02/20 1059    Clinical Impression Statement Pt experienced general muscle sorenesss after last visit but no increased pain.  She was able to increase reps and progress to slightly higher exs  level without complaint of pain.  She had a couple of random muscle cramps which resolved quickly.    Personal Factors and Comorbidities Profession    Examination-Activity Limitations Reach Overhead;Other    Stability/Clinical Decision Making Stable/Uncomplicated    Rehab Potential Good    PT Frequency 2x / week    PT Duration 6 weeks    PT Treatment/Interventions Therapeutic activities;Therapeutic exercise;Neuromuscular re-education;Manual techniques;Patient/family education;Scar mobilization    PT Next Visit Plan Cont thoracic, lumbar, core strength building in rotation, foam roller for thoracic, standing rotation with ball, consider STM prn to lateral trunk and right rib area prn    PT Home Exercise Plan exs instructed today for shouder/thoracic core strength    Consulted and Agree with Plan of Care Patient           Patient will benefit from skilled therapeutic intervention in order to improve the following deficits and impairments:  Pain,Impaired UE functional  use,Decreased activity tolerance,Decreased scar mobility,Postural dysfunction  Visit Diagnosis: Abnormal posture  Aftercare following surgery for neoplasm  Muscle weakness (generalized)  Stiffness of right shoulder, not elsewhere classified     Problem List Patient Active Problem List   Diagnosis Date Noted  . Neutropenia,  drug-induced ([place]) 08/20/2018  . Genetic testing 06/22/2018  . Family history of lung cancer   . Family history of colon cancer   . Family history of pancreatic cancer   . Malignant neoplasm of upper-inner quadrant of right breast in female, estrogen receptor positive ([place]) 04/19/2018    Claris Pong 11/02/2020, 11:02 AM  [place], [place], [address] Phone: [phone]   Fax:  [phone]  Name: Miranda Ayers MRN: [id number] Date of Birth: [birth year] Cheral Almas, PT 11/02/20 11:04 AM

## 2020-11-04 ENCOUNTER — Ambulatory Visit: Payer: BC Managed Care – PPO | Admitting: Physical Therapy

## 2020-11-09 ENCOUNTER — Ambulatory Visit: Payer: BC Managed Care – PPO

## 2020-11-09 ENCOUNTER — Other Ambulatory Visit: Payer: Self-pay

## 2020-11-09 DIAGNOSIS — M6281 Muscle weakness (generalized): Secondary | ICD-10-CM

## 2020-11-09 DIAGNOSIS — R293 Abnormal posture: Secondary | ICD-10-CM

## 2020-11-09 DIAGNOSIS — Z483 Aftercare following surgery for neoplasm: Secondary | ICD-10-CM

## 2020-11-09 DIAGNOSIS — M25611 Stiffness of right shoulder, not elsewhere classified: Secondary | ICD-10-CM

## 2020-11-09 NOTE — Therapy (Signed)
[place], [place], [address] Phone: [phone]   Fax:  [phone]  Physical Therapy Treatment  Patient Details  Name: Miranda Ayers MRN: [id number] Date of Birth: [birth year] Referring Provider (PT): Dr. Brantley Stage   Encounter Date: 11/09/2020   PT End of Session - 11/09/20 1057    Visit Number 4    Number of Visits 12    Date for PT Re-Evaluation 11/30/20    PT Start Time 0959    PT Stop Time 1050    PT Time Calculation (min) 51 min    Activity Tolerance Patient tolerated treatment well    Behavior During Therapy [facility] for tasks assessed/performed           Past Medical History:  Diagnosis Date  . Cancer [facility])    breast cancer  . CHF (congestive heart failure) ([place])   . Family history of colon cancer   . Family history of lung cancer   . Family history of pancreatic cancer   . Hypertension     Past Surgical History:  Procedure Laterality Date  . MASTECTOMY WITH RADIOACTIVE SEED GUIDED EXCISION AND AXILLARY SENTINEL LYMPH NODE BIOPSY Right 11/20/2018   Procedure: RIGHT SIMPLE MASTECTOMY WITH RIGHT RADIOACTIVE SEED TARGETED LYMPH NODE BIOPSY AND RIGHT  AXILLARY SENTINEL LYMPH NODE MAPPING AND PORT REMOVEL RIGHT CHEST;  Surgeon: [name], MD;  Location: [place];  Service: General;  Laterality: Right;  . OOPHORECTOMY  2013  . PORTACATH PLACEMENT N/A 05/08/2018   Procedure: INSERTION PORT-A-CATH WITH ULTRASOUND;  Surgeon: [name], MD;  Location: [place];  Service: General;  Laterality: N/A;    There were no vitals filed for this visit.   Subjective Assessment - 11/09/20 1001    Subjective Have not had any episodes of right rib pain lately and feel like I am less tentative . Have done well turningt to drive without any pain. I have been pushing stuff on a belt but it hasn't bothered me much.  I was really tender in the right inferior armpit 1 day  after work.    Pertinent History 11/20/18- right mastectomy for treatment of right breast cancer as well as SLNB (2 of 4 nodes positive), pt had chemo and radiation.    How long can you sit comfortably? no limitation    How long can you stand comfortably? as long as she needs to    How long can you walk comfortably? no limit    Patient Stated Goals Get rid of the right rib/ lateral trunk pain. Be able to push, pull. twist without pain    Currently in Pain? No/denies    Pain Score 0-No pain                             [facility] Adult PT Treatment/Exercise - 11/09/20 0001      Lumbar Exercises: Supine   Basic Lumbar Stabilization 10 reps   on half foam roll with march.   Basic Lumbar Stabilization 10 reps   on half roll with ppt and opp arm and leg.   Straight Leg Raise 10 reps   on half foam roll   Large Ball Oblique Isometric Limitations sittig on ball 2 x 5 obligues    Other Supine Lumbar Exercises sitting on ball crunches 2 x10    Other Supine Lumbar Exercises LTR x 5 ea      Lumbar Exercises:  Quadruped   Opposite Arm/Leg Raise Right arm/Left leg;Left arm/Right leg;10 reps    Other Quadruped Lumbar Exercises thoracic rotation reaching under to opposite side   5 ea side   Other Quadruped Lumbar Exercises thoracic rotation with single arm raise   alternate sides.     Shoulder Exercises: Standing   Protraction Strengthening;Right;Left;20 reps   bouncing on green ball   Theraband Level (Shoulder Protraction) Level 2 (Red)   bouncing on green ball   External Rotation Strengthening;Both;20 reps   bouncing on green ball   Theraband Level (Shoulder External Rotation) Level 2 (Red)    Retraction Strengthening;Right;Left;10 reps   bouncing on green ball   Theraband Level (Shoulder Retraction) Level 2 (Red)    Diagonals Strengthening;Right;Left;10 reps   bouncing on green ball   Theraband Level (Shoulder Diagonals) Level 1 (Yellow)    Diagonals Weight (lbs) D2 extension    bouncing on green ball with red band x 10 ea   Shoulder Elevation Limitations wall push ups x 20    Other Standing Exercises bouncing on green ball; bilateral trunk rotation with 4 lb wt  2 x 5    Other Standing Exercises Draw the arrow red while bouncing on ball x 10                  PT Education - 11/09/20 1056    Education Details Pt was educated in quad trunk rotation reaching under opposit arm, crunches, and obliques x 10 ea    Person(s) Educated Patient    Methods Explanation;Demonstration;Handout    Comprehension Returned demonstration               PT Long Term Goals - 10/19/20 1019      PT LONG TERM GOAL #1   Title Pt will be independent in HEP for postural strengthening and trunk ROM/strength    Time 4    Period Weeks    Status New    Target Date 11/16/20      PT LONG TERM GOAL #2   Title pt will have full trunk rotation bilaterally without increased pain    Time 6    Period Weeks    Status New    Target Date 11/30/20      PT LONG TERM GOAL #3   Title Pt will have 75% decrease in right rib symptoms/pain    Time 6    Period Weeks    Status New    Target Date 11/30/20      PT LONG TERM GOAL #4   Title Pt will be able to perform home and work activities without producing right rib pain    Time 6    Period Weeks    Status New    Target Date 11/30/20                 Plan - 11/09/20 1057    Clinical Impression Statement pt has been compliant with HEP.  She has occasional muscle soreness from exercises but has not had any reproduction of pain.  She feels she is able to do activities at work and is not as guarded or aware of her movements.  She has not had any reproduction of pain.    Personal Factors and Comorbidities Profession    Examination-Activity Limitations Reach Overhead;Other    Stability/Clinical Decision Making Stable/Uncomplicated    Rehab Potential Good    PT Frequency 2x / week    PT Duration 6 weeks  PT  Treatment/Interventions Therapeutic activities;Therapeutic exercise;Neuromuscular re-education;Manual techniques;Patient/family education;Scar mobilization    PT Next Visit Plan check goals;doing well, may be ready for DC.Cont thoracic, lumbar, core strength building in rotation, foam roller for thoracic,consider STM prn to lateral trunk and right rib area prn    PT Home Exercise Plan exs instructed for shouder/thoracic core strength, quad trunk rotation, quad stabs    Consulted and Agree with Plan of Care Patient           Patient will benefit from skilled therapeutic intervention in order to improve the following deficits and impairments:  Pain,Impaired UE functional use,Decreased activity tolerance,Decreased scar mobility,Postural dysfunction  Visit Diagnosis: Abnormal posture  Aftercare following surgery for neoplasm  Muscle weakness (generalized)  Stiffness of right shoulder, not elsewhere classified     Problem List Patient Active Problem List   Diagnosis Date Noted  . Neutropenia, drug-induced ([place]) 08/20/2018  . Genetic testing 06/22/2018  . Family history of lung cancer   . Family history of colon cancer   . Family history of pancreatic cancer   . Malignant neoplasm of upper-inner quadrant of right breast in female, estrogen receptor positive ([place]) 04/19/2018    Claris Pong 11/09/2020, 12:17 PM  [place], [place], [address] Phone: [phone]   Fax:  [phone]  Name: Miranda Ayers MRN: [id number] Date of Birth: September 24, 1985 Cheral Almas, PT 11/09/20 12:17 PM

## 2020-11-09 NOTE — Patient Instructions (Signed)
Access Code: [email or web address] URL: [email or web address] Date: 11/09/2020 Prepared by: Cheral Almas  Exercises Quadruped Thoracic Rotation - Reach Under - 1 x daily - 7 x weekly - 1 sets - 5 reps Also crunches x 10 Reach to opposite side to strengthen obligues

## 2020-11-10 ENCOUNTER — Telehealth: Payer: Self-pay

## 2020-11-10 NOTE — Telephone Encounter (Signed)
Call to [facility] PA is not required for CPT 95800.  Call to patient advised her to proceed with WatchPat One sleep study.  Reviewed instructions with patient who verbalized understanding. [facility]Suits [facility] RN CCM

## 2020-11-11 ENCOUNTER — Ambulatory Visit: Payer: BC Managed Care – PPO

## 2020-11-12 ENCOUNTER — Encounter (INDEPENDENT_AMBULATORY_CARE_PROVIDER_SITE_OTHER): Payer: BC Managed Care – PPO | Admitting: Cardiology

## 2020-11-12 DIAGNOSIS — G4733 Obstructive sleep apnea (adult) (pediatric): Secondary | ICD-10-CM

## 2020-11-17 ENCOUNTER — Ambulatory Visit: Payer: BC Managed Care – PPO | Attending: Surgery

## 2020-11-17 ENCOUNTER — Other Ambulatory Visit: Payer: Self-pay

## 2020-11-17 DIAGNOSIS — M25611 Stiffness of right shoulder, not elsewhere classified: Secondary | ICD-10-CM | POA: Insufficient documentation

## 2020-11-17 DIAGNOSIS — R293 Abnormal posture: Secondary | ICD-10-CM | POA: Diagnosis present

## 2020-11-17 DIAGNOSIS — Z483 Aftercare following surgery for neoplasm: Secondary | ICD-10-CM | POA: Insufficient documentation

## 2020-11-17 DIAGNOSIS — M6281 Muscle weakness (generalized): Secondary | ICD-10-CM | POA: Insufficient documentation

## 2020-11-17 NOTE — Therapy (Signed)
[place], [place], [address] Phone: [phone]   Fax:  [phone]  Physical Therapy Treatment  Patient Details  Name: Miranda Ayers MRN: [id number] Date of Birth: 1986/03/21 Referring Provider (PT): Dr. Brantley Stage   Encounter Date: 11/17/2020   PT End of Session - 11/17/20 1053    Visit Number 5    Number of Visits 12    Date for PT Re-Evaluation 11/30/20    PT Start Time 6948    PT Stop Time 1047    PT Time Calculation (min) 24 min    Activity Tolerance Patient tolerated treatment well    Behavior During Therapy [facility] for tasks assessed/performed           Past Medical History:  Diagnosis Date  . Cancer [facility])    breast cancer  . CHF (congestive heart failure) ([place])   . Family history of colon cancer   . Family history of lung cancer   . Family history of pancreatic cancer   . Hypertension     Past Surgical History:  Procedure Laterality Date  . MASTECTOMY WITH RADIOACTIVE SEED GUIDED EXCISION AND AXILLARY SENTINEL LYMPH NODE BIOPSY Right 11/20/2018   Procedure: RIGHT SIMPLE MASTECTOMY WITH RIGHT RADIOACTIVE SEED TARGETED LYMPH NODE BIOPSY AND RIGHT  AXILLARY SENTINEL LYMPH NODE MAPPING AND PORT REMOVEL RIGHT CHEST;  Surgeon: [name], MD;  Location: [place];  Service: General;  Laterality: Right;  . OOPHORECTOMY  2013  . PORTACATH PLACEMENT N/A 05/08/2018   Procedure: INSERTION PORT-A-CATH WITH ULTRASOUND;  Surgeon: [name], MD;  Location: [place];  Service: General;  Laterality: N/A;    There were no vitals filed for this visit.   Subjective Assessment - 11/17/20 1024    Subjective I did fine after last visit and I have not had any pain at home or work or with turning to drive.  I am keeping up with my exercises. Feel like I can do everything without having to think about it. I am more confident now.  I can close the back of the Lucianne Lei now  without any problems.  I can tell the difference.    Pertinent History 11/20/18- right mastectomy for treatment of right breast cancer as well as SLNB (2 of 4 nodes positive), pt had chemo and radiation.    How long can you sit comfortably? no limitation    How long can you stand comfortably? as long as she needs to    How long can you walk comfortably? no limit    Patient Stated Goals Get rid of the right rib/ lateral trunk pain. Be able to push, pull. twist without pain    Currently in Pain? No/denies    Pain Score 0-No pain              [facility] PT Assessment - 11/17/20 1032      Assessment   Medical Diagnosis right breast cancer    Referring Provider (PT) Dr. Brantley Stage    Onset Date/Surgical Date 11/20/18    Hand Dominance Right      Prior Function   Leisure 3x wk pt walks for 1-2 miles      AROM   Lumbar Flexion WNL    Lumbar Extension WNL    Lumbar - Right Side Bend WNL    Lumbar - Left Side Bend WNL    Lumbar - Right Rotation WNL   unless hips are held then mild pain now worse  Lumbar - Left Rotation got a pain on first attempt that did not linger when hips were held, and then able to repeat multiple times without pain.   ROM WNL                        [facility] Adult PT Treatment/Exercise - 11/17/20 0001      Lumbar Exercises: Quadruped   Other Quadruped Lumbar Exercises thoracic rotation reaching under to opposite side    Other Quadruped Lumbar Exercises sitting thoracic rotation with reach      Shoulder Exercises: Standing   Other Standing Exercises trunk rotation with red band x 10 bilaterally                       PT Long Term Goals - 11/17/20 1027      PT LONG TERM GOAL #1   Title Pt will be independent in HEP for postural strengthening and trunk ROM/strength    Time 4    Period Weeks    Status Achieved      PT LONG TERM GOAL #2   Title pt will have full trunk rotation bilaterally without increased pain    Baseline if hips are not  held during testing to isolate then gets discomfort on both sides that does not linger    Time 6    Period Weeks    Status Achieved      PT LONG TERM GOAL #3   Title Pt will have 75% decrease in right rib symptoms/pain    Time 6    Period Weeks    Status Achieved      PT LONG TERM GOAL #4   Title Pt will be able to perform home and work activities without producing right rib pain    Time 6    Period Weeks    Status Achieved                 Plan - 11/17/20 1053    Clinical Impression Statement Pt was very late for therapy today.  We reviewed all LTG's and pt has achieved all.  She has full trunk rotation without pain if her hips are not held, but when holding hips gets slight pain which resolves quickly.  She is now able to tolerate her home and work activities without pain, and feels much more confident when doing things. We discussed her HEP and it was suggested that she continue doing her exercises atleast 3 days per week, or she may break them up so she does core strength 1 day and theraband exersies another day.  Pt has been compliant with her HEP and uses good form.    Personal Factors and Comorbidities Profession    Examination-Activity Limitations Reach Overhead;Other    Stability/Clinical Decision Making Stable/Uncomplicated    Rehab Potential Good    PT Frequency 2x / week    PT Duration 6 weeks    PT Treatment/Interventions Therapeutic activities;Therapeutic exercise;Neuromuscular re-education;Manual techniques;Patient/family education;Scar mobilization    PT Next Visit Plan Pt discharged to HEP    PT Home Exercise Plan exs instructed for shouder/thoracic core strength, quad trunk rotation, quad stabs    Consulted and Agree with Plan of Care Patient           Patient will benefit from skilled therapeutic intervention in order to improve the following deficits and impairments:  Pain,Impaired UE functional use,Decreased activity tolerance,Decreased scar  mobility,Postural dysfunction  Visit Diagnosis: Abnormal  posture  Aftercare following surgery for neoplasm  Muscle weakness (generalized)  Stiffness of right shoulder, not elsewhere classified     Problem List Patient Active Problem List   Diagnosis Date Noted  . Neutropenia, drug-induced ([place]) 08/20/2018  . Genetic testing 06/22/2018  . Family history of lung cancer   . Family history of colon cancer   . Family history of pancreatic cancer   . Malignant neoplasm of upper-inner quadrant of right breast in female, estrogen receptor positive ([place]) 04/19/2018   PHYSICAL THERAPY DISCHARGE SUMMARY  Visits from Start of Care: 5  Current functional level related to goals / functional outcomes: Achieved goals  Remaining deficits: NA   Education / Equipment:  theraband, HEP Plan: Patient agrees to discharge.  Patient goals were met. Patient is being discharged due to meeting the stated rehab goals.  ?????      Claris Pong 11/17/2020, 10:59 AM  [place], [place], [address] Phone: [phone]   Fax:  [phone]  Name: Ester Hilley MRN: [id number] Date of Birth: 1986/02/20  Cheral Almas, PT 11/17/20 11:00 AM

## 2020-11-19 ENCOUNTER — Ambulatory Visit: Payer: BC Managed Care – PPO

## 2020-11-19 DIAGNOSIS — R0683 Snoring: Secondary | ICD-10-CM

## 2020-11-19 DIAGNOSIS — G4719 Other hypersomnia: Secondary | ICD-10-CM

## 2020-11-19 NOTE — Procedures (Signed)
   Sleep Study Report  Patient Information Name: Miranda Ayers  ID: [id number] Birth Date: May 27, 1986  Age: 35  Gender: Female Study Date:11/12/2020 Referring Physician:  Glori Bickers, MD  TEST DESCRIPTION: Home sleep apnea testing was completed using the WatchPat, a Type 1 device, utilizing  peripheral arterial tonometry (PAT), chest movement, actigraphy, pulse oximetry, pulse rate, body position and snore.  AHI was calculated with apnea and hypopnea using valid sleep time as the denominator. RDI includes apneas,  hypopneas, and RERAs. The data acquired and the scoring of sleep and all associated events were performed in  accordance with the recommended standards and specifications as outlined in the AASM Manual for the Scoring of  Sleep and Associated Events 2.2.0 (2015).   FINDINGS:  1. Mild Obstructive Sleep Apnea with AHI 11.2/hr.  2. No Central Sleep Apnea with pAHIc 0.7/hr.  3. Oxygen desaturations as low as 89%.  4. Mild snoring was present. O2 sats were < 88% for 0 min.  5. Total sleep time was 5 hrs and 17 min.  6. 28.8% of total sleep time was spent in REM sleep.  7. Prolonged sleep onset latency at 43 min.  8. Shortened REM sleep onset latency at 33 min.  9. Total awakenings were 14.   DIAGNOSIS:  Mild Obstructive Sleep Apnea (G47.33)  RECOMMENDATIONS: 1. Clinical correlation of these findings is necessary. The decision to treat obstructive sleep apnea (OSA) is usually  based on the presence of apnea symptoms or the presence of associated medical conditions such as Hypertension,  Congestive Heart Failure, Atrial Fibrillation or Obesity. The most common symptoms of OSA are snoring, gasping for  breath while sleeping, daytime sleepiness and fatigue.   2. Initiating apnea therapy is recommended given the presence of symptoms and/or associated conditions.  Recommend proceeding with one of the following:   a. Auto-CPAP therapy with a pressure range of 5-20cm  H2O.   b. An oral appliance (OA) that can be obtained from certain dentists with expertise in sleep medicine. These are  primarily of use in non-obese patients with mild and moderate disease.   c. An ENT consultation which may be useful to look for specific causes of obstruction and possible treatment  Options.   d. If patient is intolerant to PAP therapy, consider referral to ENT for evaluation for hypoglossal nerve stimulator.   3. Close follow-up is necessary to ensure success with CPAP or oral appliance therapy for maximum benefit .  4. A follow-up oximetry study on CPAP is recommended to assess the adequacy of therapy and determine the need  for supplemental oxygen or the potential need for Bi-level therapy. An arterial blood gas to determine the adequacy of  baseline ventilation and oxygenation should also be considered.  5. Healthy sleep recommendations include: adequate nightly sleep (normal 7-9 hrs/night), avoidance of caffeine after  noon and alcohol near bedtime, and maintaining a sleep environment that is cool, dark and quiet.  6. Weight loss for overweight patients is recommended. Even modest amounts of weight loss can significantly  improve the severity of sleep apnea.  7. Snoring recommendations include: weight loss where appropriate, side sleeping, and avoidance of alcohol before  Bed.  8. Operation of motor vehicle or dangerous equipment must be avoided when feeling drowsy, excessively sleepy, or  mentally fatigued.  Report prepared by: Signature: Fransico Him, MD; [facility]; [place], [place] Board of Sleep Medicine  Electronically Signed: Nov 19, 2020

## 2020-11-23 ENCOUNTER — Ambulatory Visit (HOSPITAL_COMMUNITY)
Admission: RE | Admit: 2020-11-23 | Discharge: 2020-11-23 | Disposition: A | Discharge status: Home / Self Care | Payer: BC Managed Care – PPO | Source: Ambulatory Visit | Attending: Internal Medicine | Admitting: Internal Medicine

## 2020-11-23 ENCOUNTER — Other Ambulatory Visit: Payer: Self-pay

## 2020-11-23 DIAGNOSIS — Z170 Estrogen receptor positive status [ER+]: Secondary | ICD-10-CM | POA: Diagnosis not present

## 2020-11-23 DIAGNOSIS — C50211 Malignant neoplasm of upper-inner quadrant of right female breast: Secondary | ICD-10-CM | POA: Insufficient documentation

## 2020-11-23 DIAGNOSIS — I5022 Chronic systolic (congestive) heart failure: Secondary | ICD-10-CM | POA: Diagnosis not present

## 2020-11-23 DIAGNOSIS — Z01818 Encounter for other preprocedural examination: Secondary | ICD-10-CM | POA: Diagnosis not present

## 2020-11-23 LAB — ECHOCARDIOGRAM COMPLETE
Area-P 1/2: 7.44 cm2
Calc EF: 51.6 %
S' Lateral: 3.3 cm
Single Plane A2C EF: 45.5 %
Single Plane A4C EF: 56.8 %

## 2020-11-23 NOTE — Progress Notes (Signed)
  Echocardiogram 2D Echocardiogram has been performed.  Bobbye Charleston 11/23/2020, 10:56 AM

## 2020-11-26 ENCOUNTER — Encounter (HOSPITAL_COMMUNITY): Payer: Self-pay | Admitting: Cardiology

## 2020-12-01 ENCOUNTER — Telehealth (HOSPITAL_COMMUNITY): Payer: Self-pay | Admitting: *Deleted

## 2020-12-01 ENCOUNTER — Telehealth: Payer: Self-pay | Admitting: *Deleted

## 2020-12-01 DIAGNOSIS — G4733 Obstructive sleep apnea (adult) (pediatric): Secondary | ICD-10-CM

## 2020-12-01 NOTE — Telephone Encounter (Signed)
-----   Message from Sueanne Margarita, MD sent at 11/19/2020 12:36 PM EDT ----- Please let patient know that they have sleep apnea and recommend treating with CPAP.  Please order an auto CPAP from 4-15cm H2O with heated humidity and mask of choice.  Order overnight pulse ox on CPAP.  Followup with me in 6 weeks.

## 2020-12-01 NOTE — Telephone Encounter (Signed)
Home sleep study denied by [facility] "not medically necessary"

## 2020-12-01 NOTE — Telephone Encounter (Addendum)
The patient has been notified of the result and verbalized understanding.  All questions (if any) were answered. Marolyn Hammock, [place] 12/01/2020 5:33 PM     Upon patient request DME selection is [facility] Patient understands she will be contacted by [facility] to set up his cpap. Patient understands to call if [facility] does not contact him with new setup in a timely manner. Patient understands they will be called once confirmation has been received from [employer]  that they have received their new machine to schedule 10 week follow up appointment.   [facility] notified of new cpap order  Please add to airview Patient was grateful for the call and thanked me

## 2020-12-01 NOTE — Telephone Encounter (Signed)
Call to [facility]. Velva Harman stated WatchPat One was denied due to lack of medical necessity, precert is not required which is all the information I received when I called them on May 24th.   However, despite the fact PA is not required, they expect the provider to submit a courtesy review which writer was unaware of and was not instructed to complete during May 24th call.  Per Velva Harman providers are expected to go to the [employer] to review their coverage policies in order to determine whether the request meets medically necessity as per the coverage policy. Attempted call to patient x2, no answer received.

## 2020-12-02 NOTE — Telephone Encounter (Signed)
Third attempt to contact patient and discuss insurance denial of [place] [employer].  No answer received.  Agricultural engineer notified of insurance denial.

## 2020-12-08 ENCOUNTER — Other Ambulatory Visit (HOSPITAL_COMMUNITY): Payer: Self-pay | Admitting: Internal Medicine

## 2020-12-22 ENCOUNTER — Ambulatory Visit
Admission: RE | Admit: 2020-12-22 | Discharge: 2020-12-22 | Disposition: A | Discharge status: Home / Self Care | Payer: BC Managed Care – PPO | Source: Ambulatory Visit | Attending: Adult Health | Admitting: Adult Health

## 2020-12-22 ENCOUNTER — Encounter: Payer: Self-pay | Admitting: Hematology and Oncology

## 2020-12-22 ENCOUNTER — Other Ambulatory Visit: Payer: Self-pay

## 2020-12-22 DIAGNOSIS — Z170 Estrogen receptor positive status [ER+]: Secondary | ICD-10-CM

## 2020-12-22 HISTORY — DX: Malignant neoplasm of unspecified site of unspecified female breast: C50.919

## 2021-01-01 ENCOUNTER — Telehealth (HOSPITAL_COMMUNITY): Payer: Self-pay | Admitting: *Deleted

## 2021-01-01 NOTE — Telephone Encounter (Signed)
Entered in error

## 2021-01-21 ENCOUNTER — Encounter (HOSPITAL_COMMUNITY): Payer: Self-pay

## 2021-03-16 DIAGNOSIS — G4733 Obstructive sleep apnea (adult) (pediatric): Secondary | ICD-10-CM | POA: Diagnosis not present

## 2021-04-06 ENCOUNTER — Encounter: Payer: Self-pay | Admitting: Cardiology

## 2021-04-06 ENCOUNTER — Other Ambulatory Visit: Payer: Self-pay

## 2021-04-06 ENCOUNTER — Telehealth (INDEPENDENT_AMBULATORY_CARE_PROVIDER_SITE_OTHER): Payer: BC Managed Care – PPO | Admitting: Cardiology

## 2021-04-06 VITALS — Ht 61.0 in | Wt 182.0 lb

## 2021-04-06 DIAGNOSIS — I10 Essential (primary) hypertension: Secondary | ICD-10-CM

## 2021-04-06 DIAGNOSIS — G4733 Obstructive sleep apnea (adult) (pediatric): Secondary | ICD-10-CM

## 2021-04-06 NOTE — Progress Notes (Signed)
Virtual Visit via Video Note   This visit type was conducted due to national recommendations for restrictions regarding the COVID-19 Pandemic (e.g. social distancing) in an effort to limit this patient's exposure and mitigate transmission in our community.  Due to her co-morbid illnesses, this patient is at least at moderate risk for complications without adequate follow up.  This format is felt to be most appropriate for this patient at this time.  All issues noted in this document were discussed and addressed.  A limited physical exam was performed with this format.  Please refer to the patient's chart for her consent to telehealth for [facility].     Date:  04/06/2021   ID:  Miranda Ayers, DOB 17-Dec-1985, MRN [id number] The patient was identified using 2 identifiers.  Patient Location: Home Provider Location: Office/Clinic   PCP:  [name], [place] Providers Cardiologist:  None Advanced Heart Failure:  Glori Bickers, MD  Sleep Medicine:  Fransico Him, MD     Evaluation Performed:  Follow-Up Visit  Chief Complaint:  OSA  History of Present Illness:    Miranda Ayers is a 35 y.o. female with with a hx of breast CA, CHF and HTN who was referred for HST by Dr. Haroldine Laws for possible OSA given her CHF.  She underwent HST showing mild OSA with an AHI of 11.2/hr and was placed on auto CPAP.  She is now here for followup. She tells me that she has a hard time sometimes getting to sleep at night.  Once she is asleep she is able to stay asleep. She works until 11pm and goes to sleep around 2am and get up around 6:30am to take  her daughter to school and sometimes will go back to sleep. She has never tried any sleep aides.   She tells me that she has noticed that when she goes to sleep with it on but by the time she wakes up it is off of her face.  She will put it back on when she realizes it.  She uses a nasal mask and feels the pressure is  adequate.  She denies any significant mouth or nose dryness.    The patient does not have symptoms concerning for COVID-19 infection (fever, chills, cough, or new shortness of breath).    Past Medical History:  Diagnosis Date   Breast cancer ([place])    Cancer ([place])    breast cancer   CHF (congestive heart failure) ([place])    Family history of colon cancer    Family history of lung cancer    Family history of pancreatic cancer    Hypertension    Past Surgical History:  Procedure Laterality Date   MASTECTOMY     MASTECTOMY WITH RADIOACTIVE SEED GUIDED EXCISION AND AXILLARY SENTINEL LYMPH NODE BIOPSY Right 11/20/2018   Procedure: RIGHT SIMPLE MASTECTOMY WITH RIGHT RADIOACTIVE SEED TARGETED LYMPH NODE BIOPSY AND RIGHT  AXILLARY SENTINEL LYMPH NODE MAPPING AND PORT REMOVEL RIGHT CHEST;  Surgeon: [name], MD;  Location: [facility];  Service: General;  Laterality: Right;   OOPHORECTOMY  2013   PORTACATH PLACEMENT N/A 05/08/2018   Procedure: INSERTION PORT-A-CATH WITH ULTRASOUND;  Surgeon: [name], MD;  Location: [facility];  Service: General;  Laterality: N/A;     Current Meds  Medication Sig   spironolactone (ALDACTONE) 25 MG tablet Take 0.5 tablets (12.5 mg total) by mouth daily.   tamoxifen (NOLVADEX) 20 MG tablet  Take 1 tablet (20 mg total) by mouth daily.     Allergies:   Patient has no known allergies.   Social History   Tobacco Use   Smoking status: Never   Smokeless tobacco: Never  Vaping Use   Vaping Use: Never used  Substance Use Topics   Alcohol use: Never   Drug use: Never     Family Hx: The patient's family history includes Cancer in her cousin; Colon cancer in her maternal grandfather; Lung cancer in her maternal aunt, maternal grandfather, maternal grandmother, and maternal uncle; Pancreatic cancer in her maternal aunt; Stomach cancer in her paternal grandfather.  ROS:   Please see the history of present illness.      All other systems reviewed and are negative.   Prior CV studies:   The following studies were reviewed today:  Home sleep study and PAP compliance download  Labs/Other Tests and Data Reviewed:    EKG:  No ECG reviewed.  Recent Labs: No results found for requested labs within last 8760 hours.   Recent Lipid Panel No results found for: CHOL, TRIG, HDL, CHOLHDL, LDLCALC, LDLDIRECT  Wt Readings from Last 3 Encounters:  04/06/21 182 lb (82.6 kg)  10/08/20 187 lb 3.2 oz (84.9 kg)  06/26/20 181 lb 9.6 oz (82.4 kg)     Risk Assessment/Calculations:          Objective:    Vital Signs:  Ht 5\' 1"  (1.549 m)   Wt 182 lb (82.6 kg)   BMI 34.39 kg/m    VITAL SIGNS:  reviewed GEN:  no acute distress EYES:  sclerae anicteric, EOMI - Extraocular Movements Intact RESPIRATORY:  normal respiratory effort, symmetric expansion CARDIOVASCULAR:  no peripheral edema SKIN:  no rash, lesions or ulcers. MUSCULOSKELETAL:  no obvious deformities. NEURO:  alert and oriented x 3, no obvious focal deficit PSYCH:  normal affect  ASSESSMENT & PLAN:    OSA - The patient is tolerating PAP therapy well without any problems. The PAP download performed by his DME was personally reviewed and interpreted by me today and showed an AHI of 1.6/hr on auto PAP with 3% compliance in using more than 4 hours nightly.  The patient has been using and benefiting from PAP use and will continue to benefit from therapy.  -she has problems getting to sleep at night after getting home from work at 11:30pm so I have recommended taking Melatonin 2-3mg  when she gets home at night and when she gets up in the am do go to the gym then instead of when she gets home at night   HTN -Bp controlled at home -continue prescription drug management with Carvedilol 6.25mg  BID, spiro 12.5mg  daily and Entresto 97-103mg  BID with PRN refills   COVID-19 Education: The signs and symptoms of COVID-19 were discussed with the patient and  how to seek care for testing (follow up with PCP or arrange E-visit).  The importance of social distancing was discussed today.  Time:   Today, I have spent 15 minutes with the patient with telehealth technology discussing the above problems.     Medication Adjustments/Labs and Tests Ordered: Current medicines are reviewed at length with the patient today.  Concerns regarding medicines are outlined above.   Tests Ordered: No orders of the defined types were placed in this encounter.   Medication Changes: No orders of the defined types were placed in this encounter.   Follow Up:  In Person in 6 weeks  Signed, Fransico Him, MD  04/06/2021 9:30 AM    [facility]

## 2021-04-06 NOTE — Patient Instructions (Addendum)
Medication Instructions:  Your physician recommends that you continue on your current medications as directed. Please refer to the Current Medication list given to you today.  *If you need a refill on your cardiac medications before your next appointment, please call your pharmacy*  Follow-Up: At [facility], you and your health needs are our priority.  As part of our continuing mission to provide you with exceptional heart care, we have created designated Provider Care Teams.  These Care Teams include your primary Cardiologist (physician) and Advanced Practice Providers (APPs -  Physician Assistants and Nurse Practitioners) who all work together to provide you with the care you need, when you need it.  Your next appointment:   6 week(s)  The format for your next appointment:   Virtual   Provider:   Fransico Him, MD

## 2021-04-15 DIAGNOSIS — G4733 Obstructive sleep apnea (adult) (pediatric): Secondary | ICD-10-CM | POA: Diagnosis not present

## 2021-04-19 DIAGNOSIS — J101 Influenza due to other identified influenza virus with other respiratory manifestations: Secondary | ICD-10-CM | POA: Diagnosis not present

## 2021-05-16 DIAGNOSIS — G4733 Obstructive sleep apnea (adult) (pediatric): Secondary | ICD-10-CM | POA: Diagnosis not present

## 2021-05-25 ENCOUNTER — Telehealth: Payer: BC Managed Care – PPO | Admitting: Cardiology

## 2021-06-15 DIAGNOSIS — G4733 Obstructive sleep apnea (adult) (pediatric): Secondary | ICD-10-CM | POA: Diagnosis not present

## 2021-06-27 NOTE — Assessment & Plan Note (Signed)
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%, PR 0%, HER-2 negative, Ki-67 20%; biopsy of the 4.5 cm mass came back as IDC grade 2-3, ER 50%, PR 5%, HER-2 -2+ ratio 1.6, copy #3.2, Ki-67 20%, T2N1 stage IIb  Recommendation: 1.Neoadjuvant chemotherapy with dose dense Adriamycinand Cytoxan x4 followed by Taxol weekly x11 05/09/2018-10/02/2018 2.11/20/2018:Rightmastectomy and axillary lymph node dissection 3.Since the final path was HER-2 positive she will get 1 year of Herceptincompleted 01/17/20 4.Adjuvantradiation7/14/2020-02/14/2019 5.Followed by adjuvant antiestrogen therapypatient prefers tamoxifen starting 03/28/2019 ----------------------------------------------------------- 11/20/2018:Right mastectomy: Residual grade 2 invasive ductal carcinoma multifocal, largest 1.8 cm, intermediate grade DCIS, margins negative, lymphovascular space invasion present, 0/6 lymph nodes negative, ER 60%, PR 60%, HER-2 positive ratio 2.59  Current treatment: Tamoxifen 20 mg daily 03/28/2019  Tamoxifen toxicities:4-5 times per day hot flashes manageable. Will continue this daily.  Breast cancer surveillance:  Left mammogram 12/28/20: Benign breast density category B, prior right mastectomy Breast Exam: 06/28/21: Benign, right chest wall scar is without any lumps or nodules.  Return to clinic in 1 year for follow-up

## 2021-06-28 ENCOUNTER — Other Ambulatory Visit: Payer: Self-pay

## 2021-06-28 ENCOUNTER — Inpatient Hospital Stay: Payer: BC Managed Care – PPO | Attending: Hematology and Oncology | Admitting: Hematology and Oncology

## 2021-06-28 DIAGNOSIS — C50211 Malignant neoplasm of upper-inner quadrant of right female breast: Secondary | ICD-10-CM | POA: Diagnosis not present

## 2021-06-28 DIAGNOSIS — Z170 Estrogen receptor positive status [ER+]: Secondary | ICD-10-CM | POA: Diagnosis not present

## 2021-06-28 DIAGNOSIS — Z79810 Long term (current) use of selective estrogen receptor modulators (SERMs): Secondary | ICD-10-CM | POA: Insufficient documentation

## 2021-06-28 MED ORDER — TAMOXIFEN CITRATE 20 MG PO TABS: 20.0000 mg | ORAL_TABLET | Freq: Every day | ORAL | 4 refills | Status: DC

## 2021-06-28 NOTE — Progress Notes (Signed)
Patient Care Team: [name], DO as PCP - General (Family Medicine) Bensimhon, Shaune Pascal, MD as PCP - Advanced Heart Failure (Cardiology) Sueanne Margarita, MD as PCP - Sleep Medicine (Cardiology) [name], MD as Consulting Physician (General Surgery) [name], MD as Consulting Physician (Hematology and Oncology) Kyung Rudd, MD as Consulting Physician (Radiation Oncology)  DIAGNOSIS:  Encounter Diagnosis  Name Primary?   Malignant neoplasm of upper-inner quadrant of right breast in female, estrogen receptor positive ([place])     SUMMARY OF ONCOLOGIC HISTORY: Oncology History  Malignant neoplasm of upper-inner quadrant of right breast in female, estrogen receptor positive ([place])  04/17/2018 Initial Diagnosis   Palpable right breast masses UOQ and UIQ with multiple masses 12 to 14 cm, at 1:00 1.6 cm, 1231.8 cm, and o'clock 4.5 cm and at 9:30 position 5 cm: Biopsy revealed IDC with DCIS, grade 3, ER 80%, PR 0%, HER-2 negative, Ki-67 20%; biopsy of the 4.5 cm mass came back as IDC grade 2-3, ER 50%, PR 5%, HER-2 -2+ ratio 1.6, copy #3.2, Ki-67 20%, T2N1 stage IIb   05/11/2018 - 01/17/2020 Chemotherapy   dexamethasone (DECADRON) 4 MG tablet, 4 mg (100 % of original dose 4 mg), Oral, Daily, 1 of 1 cycle, Start date: 04/25/2018, End date: 07/10/2018. Dose modification: 4 mg (original dose 4 mg, Cycle 0)  DOXOrubicin (ADRIAMYCIN) chemo injection 104 mg, 60 mg/m2 = 104 mg, Intravenous,  Once, 4 of 4 cycles. Administration: 104 mg (05/11/2018), 104 mg (05/23/2018), 104 mg (06/06/2018), 104 mg (06/22/2018)  palonosetron (ALOXI) injection 0.25 mg, 0.25 mg, Intravenous,  Once, 4 of 4 cycles. Administration: 0.25 mg (05/11/2018), 0.25 mg (05/23/2018), 0.25 mg (06/06/2018), 0.25 mg (06/22/2018)  pegfilgrastim-cbqv (UDENYCA) injection 6 mg, 6 mg, Subcutaneous, Once, 4 of 4 cycles. Administration: 6 mg (05/12/2018), 6 mg (05/24/2018), 6 mg (06/07/2018), 6 mg (06/23/2018)  cyclophosphamide  (CYTOXAN) 1,040 mg in sodium chloride 0.9 % 250 mL chemo infusion, 600 mg/m2 = 1,040 mg, Intravenous,  Once, 4 of 4 cycles. Administration: 1,040 mg (05/11/2018), 1,040 mg (05/23/2018), 1,040 mg (06/06/2018), 1,040 mg (06/22/2018)  PACLitaxel (TAXOL) 138 mg in sodium chloride 0.9 % 250 mL chemo infusion (</= 37m/m2), 80 mg/m2 = 138 mg, Intravenous,  Once, 11 of 12 cycles. Administration: 138 mg (07/10/2018), 138 mg (07/17/2018), 138 mg (07/24/2018), 138 mg (08/14/2018), 138 mg (08/22/2018), 138 mg (08/28/2018), 138 mg (09/04/2018), 138 mg (09/11/2018), 138 mg (09/18/2018), 138 mg (09/25/2018), 138 mg (10/02/2018)  fosaprepitant (EMEND) 150 mg  dexamethasone (DECADRON) 12 mg in sodium chloride 0.9 % 145 mL IVPB, , Intravenous,  Once, 4 of 4 cycles. Administration:  (05/11/2018),  (05/23/2018),  (06/06/2018),  (06/22/2018)  trastuzumab (HERCEPTIN) 546 mg in sodium chloride 0.9 % 250 mL chemo infusion, 8 mg/kg = 546 mg, Intravenous,  Once, 3 of 3 cycles. Administration: 546 mg (12/14/2018), 399 mg (01/03/2019), 399 mg (01/24/2019)  trastuzumab-dkst (OGIVRI) 399 mg in sodium chloride 0.9 % 250 mL chemo infusion, 6 mg/kg = 399 mg (100 % of original dose 6 mg/kg), Intravenous,  Once, 14 of 14 cycles. Dose modification: 6 mg/kg (original dose 6 mg/kg, Cycle 4, Reason: Other (see comments), Comment: Biosimilar Conversion), 6 mg/kg (original dose 6 mg/kg, Cycle 15, Reason: Other (see comments), Comment: weight increase). 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  The Multi-Gene Panel offered by [facility] includes sequencing and/or deletion duplication testing of the following  84 genes: AIP, ALK, APC, ATM, AXIN2,BAP1,  BARD1, BLM, BMPR1A, BRCA1, BRCA2, BRIP1, CASR, CDC73, CDH1, CDK4, CDKN1B, CDKN1C, CDKN2A (p14ARF), CDKN2A (p16INK4a), CEBPA, CHEK2, CTNNA1, DICER1, DIS3L2, EGFR (c.2369C>T, p.Thr790Met variant only), EPCAM (Deletion/duplication testing only), FH, FLCN, GATA2, GPC3, GREM1 (Promoter region deletion/duplication testing only), HOXB13 (c.251G>A, p.Gly84Glu), HRAS, KIT, MAX, MEN1, MET, MITF (c.952G>A, p.Glu318Lys variant only), MLH1, MSH2, MSH3, MSH6, MUTYH, NBN, NF1, NF2, NTHL1, PALB2, PDGFRA, PHOX2B, PMS2, POLD1, POLE, POT1, PRKAR1A, PTCH1, PTEN, RAD50, RAD51C, RAD51D, RB1, RECQL4, RET, RUNX1, SDHAF2, SDHA (sequence changes only), SDHB, SDHC, SDHD, SMAD4, SMARCA4, SMARCB1, SMARCE1, STK11, SUFU, TERC, TERT, TMEM127, TP53, TSC1, TSC2, VHL, WRN and WT1.  The report date is 06/19/2018.   10/11/2018 Breast MRI   Significant decrease in the size of the right axillary adenopathy and right breast masses from 1.2cm to 0.9cm with minimal residual enhancement, and 2.4x1.4cm to 2.3x1.9cm.   11/20/2018 Surgery   Right mastectomy ([place]) ([id number]): Residual grade 2 invasive ductal carcinoma multifocal, largest 1.8 cm, intermediate grade DCIS, margins negative, lymphovascular space invasion present, 6 lymph nodes negative, ER 60%, PR 60%, HER-2 positive ratio 2.59   11/20/2018 Cancer Staging   Staging form: Breast, AJCC 8th Edition - Pathologic stage from 11/20/2018: Stage IA (pT1c, pN0, cM0, G2, ER+, PR+, HER2+)   01/01/2019 - 02/14/2019 Radiation Therapy   The patient initially received a dose of 50.4 Gy in 28 fractions to the chest wall and supraclavicular region. This was delivered using a 3-D conformal, 4 field technique. The patient then received a boost to the mastectomy scar. This delivered an additional 10 Gy in 5 fractions using an en face electron field. The total dose was 60.4 Gy.   03/2019 -  Anti-estrogen oral therapy   Tamoxifen daily     CHIEF COMPLIANT:  Follow-up on tamoxifen therapy  INTERVAL HISTORY: Miranda Ayers is a 36 year old above-mentioned history of right breast cancer treated with neoadjuvant chemotherapy followed by right mastectomy.  She had residual tumor.  Her cancer was ER positive PR positive HER2 positive.  She completed radiation therapy and is currently on tamoxifen.  She completed a year of anti-HER2 therapy.  She has a hot flashes and joint stiffness related to tamoxifen.  She denies any lumps or nodules in the breast.     ALLERGIES:  has No Known Allergies.  MEDICATIONS:  Current Outpatient Medications  Medication Sig Dispense Refill   spironolactone (ALDACTONE) 25 MG tablet Take 0.5 tablets (12.5 mg total) by mouth daily. 15 tablet 6   tamoxifen (NOLVADEX) 20 MG tablet Take 1 tablet (20 mg total) by mouth daily. 90 tablet 4   No current facility-administered medications for this visit.    PHYSICAL EXAMINATION: ECOG PERFORMANCE STATUS: 1 - Symptomatic but completely ambulatory  Vitals:   06/28/21 1020  BP: (!) 158/87  Pulse: (!) 105  Resp: 18  Temp: (!) 97.2 F (36.2 C)  SpO2: 99%   Filed Weights   06/28/21 1020  Weight: 179 lb 8 oz (81.4 kg)    BREAST: Right mastectomy scar without any palpable lumps or nodules.  Left breast no palpable lumps or nodules in the breast or axilla.  Exam performed in the presence of a chaperone)  LABORATORY DATA:  I have reviewed the data as listed CMP Latest Ref Rng &  Units 01/17/2020 12/27/2019 11/07/2019  Glucose 70 - 99 mg/dL 86 83 86  BUN 6 - 20 mg/dL 11 10 9   Creatinine 0.44 - 1.00 mg/dL 0.78 0.82 0.75  Sodium 135 - 145 mmol/L 140 139 140  Potassium 3.5 - 5.1 mmol/L 4.0 3.3(L) 3.9  Chloride 98 - 111 mmol/L 109 109 108  CO2 22 - 32 mmol/L 22 22 24   Calcium 8.9 - 10.3 mg/dL 9.6 9.1 8.9  Total Protein 6.5 - 8.1 g/dL 7.3 7.6 7.1  Total Bilirubin 0.3 - 1.2 mg/dL 0.3 0.4 0.3  Alkaline Phos 38 - 126 U/L 48 45 51  AST 15 - 41 U/L 14(L) 14(L) 16  ALT 0 - 44 U/L 12  11 12     Lab Results  Component Value Date   WBC 3.1 (L) 01/17/2020   HGB 13.0 01/17/2020   HCT 40.4 01/17/2020   MCV 89.6 01/17/2020   PLT 159 01/17/2020   NEUTROABS 1.3 (L) 01/17/2020    ASSESSMENT & PLAN:  Malignant neoplasm of upper-inner quadrant of right breast in female, estrogen receptor positive ([place]) 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%, PR 0%, HER-2 negative, Ki-67 20%; biopsy of the 4.5 cm mass came back as IDC grade 2-3, ER 50%, PR 5%, HER-2 -2+ ratio 1.6, copy #3.2, Ki-67 20%, T2N1 stage IIb   Recommendation: 1.  Neoadjuvant chemotherapy with dose dense Adriamycin and Cytoxan x4 followed by Taxol weekly x11 05/09/2018-10/02/2018 2.  11/20/2018: Right mastectomy and axillary lymph node dissection 3.  Since the final path was HER-2 positive she will get 1 year of Herceptin completed 01/17/20 4.  Adjuvant radiation 01/01/2019-02/14/2019 5.  Followed by adjuvant antiestrogen therapy patient prefers tamoxifen starting 03/28/2019 ----------------------------------------------------------- 11/20/2018:Right mastectomy: Residual grade 2 invasive ductal carcinoma multifocal, largest 1.8 cm, intermediate grade DCIS, margins negative, lymphovascular space invasion present, 0/6 lymph nodes negative, ER 60%, PR 60%, HER-2 positive ratio 2.59   Current treatment: Tamoxifen 20 mg daily 03/28/2019    Tamoxifen toxicities: 4-5 times per day hot flashes manageable.  Will continue this daily.   Breast cancer surveillance:  Left mammogram 12/28/20: Benign breast density category B, prior right mastectomy Breast Exam: 06/28/21: Benign, right chest wall scar is without any lumps or nodules.   Return to clinic in 1 year for follow-up   No orders of the defined types were placed in this encounter.  The patient has a good understanding  of the overall plan. she agrees with it. she will call with any problems that may develop before the next visit here. Total time spent: 30 mins including face to face time and time spent for planning, charting and co-ordination of care   Harriette Ohara, MD 06/28/21

## 2021-06-29 ENCOUNTER — Telehealth: Payer: Self-pay | Admitting: Hematology and Oncology

## 2021-06-29 NOTE — Telephone Encounter (Signed)
Scheduled appointment per 1/9 los. Left message.

## 2021-07-14 ENCOUNTER — Telehealth (INDEPENDENT_AMBULATORY_CARE_PROVIDER_SITE_OTHER): Payer: BC Managed Care – PPO | Admitting: Cardiology

## 2021-07-14 ENCOUNTER — Telehealth: Payer: Self-pay | Admitting: *Deleted

## 2021-07-14 ENCOUNTER — Encounter: Payer: Self-pay | Admitting: Cardiology

## 2021-07-14 ENCOUNTER — Other Ambulatory Visit: Payer: Self-pay

## 2021-07-14 VITALS — Ht 61.0 in | Wt 175.0 lb

## 2021-07-14 DIAGNOSIS — I10 Essential (primary) hypertension: Secondary | ICD-10-CM | POA: Diagnosis not present

## 2021-07-14 DIAGNOSIS — G4733 Obstructive sleep apnea (adult) (pediatric): Secondary | ICD-10-CM | POA: Diagnosis not present

## 2021-07-14 NOTE — Patient Instructions (Signed)
Medication Instructions:  Your physician recommends that you continue on your current medications as directed. Please refer to the Current Medication list given to you today.  *If you need a refill on your cardiac medications before your next appointment, please call your pharmacy*   Lab Work: NONE If you have labs (blood work) drawn today and your tests are completely normal, you will receive your results only by: [place] (if you have MyChart) OR A paper copy in the mail If you have any lab test that is abnormal or we need to change your treatment, we will call you to review the results.   Testing/Procedures: NONE   Follow-Up: Gae Bon will contact you regarding your CPAP At [facility], you and your health needs are our priority.  As part of our continuing mission to provide you with exceptional heart care, we have created designated Provider Care Teams.  These Care Teams include your primary Cardiologist (physician) and Advanced Practice Providers (APPs -  Physician Assistants and Nurse Practitioners) who all work together to provide you with the care you need, when you need it.   Provider:   Fransico Him

## 2021-07-14 NOTE — Progress Notes (Signed)
Virtual Visit via Video Note   This visit type was conducted due to national recommendations for restrictions regarding the COVID-19 Pandemic (e.g. social distancing) in an effort to limit this patient's exposure and mitigate transmission in our community.  Due to her co-morbid illnesses, this patient is at least at moderate risk for complications without adequate follow up.  This format is felt to be most appropriate for this patient at this time.  All issues noted in this document were discussed and addressed.  A limited physical exam was performed with this format.  Please refer to the patient's chart for her consent to telehealth for [facility].     Date:  07/14/2021   ID:  Miranda Ayers, DOB 1986-01-30, MRN [id number] The patient was identified using 2 identifiers.  Patient Location: Home Provider Location: Home Office   PCP:  [name], [place] Providers Cardiologist:  None Advanced Heart Failure:  Glori Bickers, MD  Sleep Medicine:  Fransico Him, MD     Evaluation Performed:  Follow-Up Visit  Chief Complaint:  OSA  History of Present Illness:    Miranda Ayers is a 36 y.o. female with with a hx of breast CA, CHF and HTN who was referred for HST by Dr. Haroldine Laws for possible OSA given her CHF.  She underwent HST showing mild OSA with an AHI of 11.2/hr and was placed on auto CPAP.  When I last saw her in the fall she was having a hard time sometimes getting to sleep at night.  Once she is asleep she is able to stay asleep. She works until 11pm and goes to sleep around 2am and get up around 6:30am to take  her daughter to school and sometimes will go back to sleep. She has never tried any sleep aides.  I have recommended that she try melatonin and try not to exercise in the evenings.  She has not been using her device recently because she says when she uses it it shoots water up her nose.  She turned off the humidifier and it still shoots  water up her nose.     She has not called her DME to let them know that water is shooting up in the tubing even when the humidity was off.  She tells me that she tried to take the melatonin but did not like the way it made her feel and stopped using it.  She is now gong to the gym in the am and feels she is sleeping better.   The patient does not have symptoms concerning for COVID-19 infection (fever, chills, cough, or new shortness of breath).    Past Medical History:  Diagnosis Date   Breast cancer ([place])    Cancer ([place])    breast cancer   CHF (congestive heart failure) ([place])    Family history of colon cancer    Family history of lung cancer    Family history of pancreatic cancer    Hypertension    Past Surgical History:  Procedure Laterality Date   MASTECTOMY     MASTECTOMY WITH RADIOACTIVE SEED GUIDED EXCISION AND AXILLARY SENTINEL LYMPH NODE BIOPSY Right 11/20/2018   Procedure: RIGHT SIMPLE MASTECTOMY WITH RIGHT RADIOACTIVE SEED TARGETED LYMPH NODE BIOPSY AND RIGHT  AXILLARY SENTINEL LYMPH NODE MAPPING AND PORT REMOVEL RIGHT CHEST;  Surgeon: [name], MD;  Location: [place] [facility];  Service: General;  Laterality: Right;   OOPHORECTOMY  2013   PORTACATH PLACEMENT  N/A 05/08/2018   Procedure: INSERTION PORT-A-CATH WITH ULTRASOUND;  Surgeon: [name], MD;  Location: [place];  Service: General;  Laterality: N/A;     Current Meds  Medication Sig   spironolactone (ALDACTONE) 25 MG tablet Take 0.5 tablets (12.5 mg total) by mouth daily.   tamoxifen (NOLVADEX) 20 MG tablet Take 1 tablet (20 mg total) by mouth daily.     Allergies:   Patient has no known allergies.   Social History   Tobacco Use   Smoking status: Never   Smokeless tobacco: Never  Vaping Use   Vaping Use: Never used  Substance Use Topics   Alcohol use: Never   Drug use: Never     Family Hx: The patient's family history includes Cancer in her cousin; Colon cancer in  her maternal grandfather; Lung cancer in her maternal aunt, maternal grandfather, maternal grandmother, and maternal uncle; Pancreatic cancer in her maternal aunt; Stomach cancer in her paternal grandfather.  ROS:   Please see the history of present illness.     All other systems reviewed and are negative.   Prior CV studies:   The following studies were reviewed today:  Home sleep study and PAP compliance download  Labs/Other Tests and Data Reviewed:    EKG:  No ECG reviewed.  Recent Labs: No results found for requested labs within last 8760 hours.   Recent Lipid Panel No results found for: CHOL, TRIG, HDL, CHOLHDL, LDLCALC, LDLDIRECT  Wt Readings from Last 3 Encounters:  07/14/21 175 lb (79.4 kg)  06/28/21 179 lb 8 oz (81.4 kg)  04/06/21 182 lb (82.6 kg)     Risk Assessment/Calculations:          Objective:    Vital Signs:  Ht 5\' 1"  (1.549 m)    Wt 175 lb (79.4 kg)    BMI 33.07 kg/m   Well nourished, well developed female in no acute distress. Well appearing, alert and conversant, regular work of breathing,  good skin color  Eyes- anicteric mouth- oral mucosa is pink  neuro- grossly intact skin- no apparent rash or lesions or cyanosis  ASSESSMENT & PLAN:     OSA - -she stopped using her device because it is getting condensation and water in the tubing even when the humidity is turned off -I will get her an appt with her DME to address this -her insomnia has improved with exercising in the am instead of in the evening  HTN -BP has been controlled at home -Continue prescription drug management with spironolactone to 12.5 mg daily with as needed refills  COVID-19 Education: The signs and symptoms of COVID-19 were discussed with the patient and how to seek care for testing (follow up with PCP or arrange E-visit).  The importance of social distancing was discussed today.  Time:   Today, I have spent 10 minutes with the patient with telehealth technology  discussing the above problems.     Medication Adjustments/Labs and Tests Ordered: Current medicines are reviewed at length with the patient today.  Concerns regarding medicines are outlined above.   Tests Ordered: No orders of the defined types were placed in this encounter.   Medication Changes: No orders of the defined types were placed in this encounter.   Follow Up:  in 8 weeks  Signed, Fransico Him, MD  07/14/2021 9:45 AM    [place]

## 2021-07-14 NOTE — Telephone Encounter (Signed)
Per dr Radford Pax Please get her an appt in person with DMR - her machine is sqirting H2O into her tubing and mask even when the humidity is off. followup with me in 8 weeks

## 2021-07-14 NOTE — Telephone Encounter (Signed)
Order placed to [facility] via community message sleep made follow up visit

## 2021-07-22 ENCOUNTER — Other Ambulatory Visit (HOSPITAL_COMMUNITY): Payer: Self-pay | Admitting: Internal Medicine

## 2021-08-25 ENCOUNTER — Other Ambulatory Visit (HOSPITAL_COMMUNITY): Payer: Self-pay | Admitting: Internal Medicine

## 2021-09-15 ENCOUNTER — Encounter: Payer: Self-pay | Admitting: Cardiology

## 2021-09-15 ENCOUNTER — Other Ambulatory Visit: Payer: Self-pay

## 2021-09-15 ENCOUNTER — Telehealth (INDEPENDENT_AMBULATORY_CARE_PROVIDER_SITE_OTHER): Payer: BC Managed Care – PPO | Admitting: Cardiology

## 2021-09-15 VITALS — Ht 61.0 in | Wt 180.0 lb

## 2021-09-15 DIAGNOSIS — I10 Essential (primary) hypertension: Secondary | ICD-10-CM | POA: Diagnosis not present

## 2021-09-15 DIAGNOSIS — G4733 Obstructive sleep apnea (adult) (pediatric): Secondary | ICD-10-CM | POA: Diagnosis not present

## 2021-09-15 NOTE — Progress Notes (Signed)
? ?Virtual Visit via Video Note  ? ?This visit type was conducted due to national recommendations for restrictions regarding the COVID-19 Pandemic (e.g. social distancing) in an effort to limit this patient's exposure and mitigate transmission in our community.  Due to her co-morbid illnesses, this patient is at least at moderate risk for complications without adequate follow up.  This format is felt to be most appropriate for this patient at this time.  All issues noted in this document were discussed and addressed.  A limited physical exam was performed with this format.  Please refer to the patient's chart for her consent to telehealth for [facility]. ? ? ?Date:  09/15/2021  ? ?ID:  Miranda Ayers, DOB Nov 09, 1985, MRN [id number] ?The patient was identified using 2 identifiers. ? ?Patient Location: Home ?Provider Location: Home Office ? ? ?PCP:  [name], DO ?  ?[facility] Providers ?Cardiologist:  None ?Advanced Heart Failure:  Glori Bickers, MD  ?Sleep Medicine:  Fransico Him, MD    ? ?Evaluation Performed:  Follow-Up Visit ? ?Chief Complaint:  OSA ? ?History of Present Illness:   ? ?Miranda Ayers is a 36 y.o. female with with a hx of breast CA, CHF and HTN who was referred for HST by Dr. Haroldine Laws for possible OSA given her CHF.  She underwent HST showing mild OSA with an AHI of 11.2/hr and was placed on auto CPAP. The last time I saw her she was having problems with her device spraying water up her nose.  She was sent back to her DME to check her device.  She also was having problems with Insomnia and I had recommended that she not exercise before bed. ? ?She talked to her her DME and was told to turn the humidifier down and it stopped shooting the water out.  She is still having problems being compliant due to her work scheduled and then get to sleep and goes to sleep at 3am and has to get up at 6am to take her daughter to school.   She tolerates the mask and feels the  pressure is adequate.   ? ?The patient does not have symptoms concerning for COVID-19 infection (fever, chills, cough, or new shortness of breath).  ? ? ?Past Medical History:  ?Diagnosis Date  ? Breast cancer ([place])   ? Cancer [facility])   ? breast cancer  ? CHF (congestive heart failure) ([place])   ? Family history of colon cancer   ? Family history of lung cancer   ? Family history of pancreatic cancer   ? Hypertension   ? ?Past Surgical History:  ?Procedure Laterality Date  ? MASTECTOMY    ? MASTECTOMY WITH RADIOACTIVE SEED GUIDED EXCISION AND AXILLARY SENTINEL LYMPH NODE BIOPSY Right 11/20/2018  ? Procedure: RIGHT SIMPLE MASTECTOMY WITH RIGHT RADIOACTIVE SEED TARGETED LYMPH NODE BIOPSY AND RIGHT  AXILLARY SENTINEL LYMPH NODE MAPPING AND PORT REMOVEL RIGHT CHEST;  Surgeon: [name], MD;  Location: [place];  Service: General;  Laterality: Right;  ? OOPHORECTOMY  2013  ? PORTACATH PLACEMENT N/A 05/08/2018  ? Procedure: INSERTION PORT-A-CATH WITH ULTRASOUND;  Surgeon: [name], MD;  Location: [place];  Service: General;  Laterality: N/A;  ?  ? ?Current Meds  ?Medication Sig  ? spironolactone (ALDACTONE) 25 MG tablet Take 0.5 tablets (12.5 mg total) by mouth daily. LAST REFILL. NEEDS FOLLOW UP APPOINTMENT FOR ANYMORE REFILLS  ? tamoxifen (NOLVADEX) 20 MG tablet Take 1 tablet (20 mg  total) by mouth daily.  ?  ? ?Allergies:   Patient has no known allergies.  ? ?Social History  ? ?Tobacco Use  ? Smoking status: Never  ? Smokeless tobacco: Never  ?Vaping Use  ? Vaping Use: Never used  ?Substance Use Topics  ? Alcohol use: Never  ? Drug use: Never  ?  ? ?Family Hx: ?The patient's family history includes Cancer in her cousin; Colon cancer in her maternal grandfather; Lung cancer in her maternal aunt, maternal grandfather, maternal grandmother, and maternal uncle; Pancreatic cancer in her maternal aunt; Stomach cancer in her paternal grandfather. ? ?ROS:   ?Please see the history of  present illness.    ? ?All other systems reviewed and are negative. ? ? ?Prior CV studies:   ?The following studies were reviewed today: ? ?PAP compliance download ? ?Labs/Other Tests and Data Reviewed:   ? ?EKG:  No ECG reviewed. ? ?Recent Labs: ?No results found for requested labs within last 8760 hours.  ? ?Recent Lipid Panel ?No results found for: CHOL, TRIG, HDL, CHOLHDL, LDLCALC, LDLDIRECT ? ?Wt Readings from Last 3 Encounters:  ?09/15/21 180 lb (81.6 kg)  ?07/14/21 175 lb (79.4 kg)  ?06/28/21 179 lb 8 oz (81.4 kg)  ?  ? ?Risk Assessment/Calculations:   ?  ? ?    ?Objective:   ? ?Vital Signs:  Ht '5\' 1"'$  (1.549 m)   Wt 180 lb (81.6 kg)   BMI 34.01 kg/m?   ?Well nourished, well developed female in no acute distress. ?Well appearing, alert and conversant, regular work of breathing,  good skin color  ?Eyes- anicteric ?mouth- oral mucosa is pink  ?neuro- grossly intact ?skin- no apparent rash or lesions or cyanosis  ? ?ASSESSMENT & PLAN:   ? ? OSA - The patient is tolerating PAP therapy well without any problems. The PAP download performed by his DME was personally reviewed and interpreted by me today and showed an AHI of 0.3/hr on auto PAP cm H2O with 0% compliance in using more than 4 hours nightly.  The patient has been using and benefiting from PAP use and will continue to benefit from therapy.  ?-She is doing well with her CPAP but unfortunately cannot make the 4-hour mark because she only sleeps about 3 hours a night due to her job and then having to get up and take her daughter to work.  I have encouraged her that if she naps during the day to put her device on.  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 ? ?  HTN ?-BP is adequately controlled at home ?-Continue prescription drug management spironolactone 12.5 mg daily with as needed refills ? ?COVID-19 Education: ?The signs and symptoms of COVID-19 were discussed with the patient and how to seek care  for testing (follow up with PCP or arrange E-visit).  The importance of social distancing was discussed today. ? ?Time:   ?Today, I have spent 10 minutes with the patient with telehealth technology discussing the above problems.   ? ? ?Medication Adjustments/Labs and Tests Ordered: ?Current medicines are reviewed at length with the patient today.  Concerns regarding medicines are outlined above.  ? ?Tests Ordered: ?No orders of the defined types were placed in this encounter. ? ? ?Medication Changes: ?No orders of the defined types were placed in this encounter. ? ? ?Follow Up:  in 1 year ? ?Signed, ?Fransico Him, MD  ?  09/15/2021 8:52 AM    ?[facility] ? ?

## 2021-09-15 NOTE — Patient Instructions (Signed)

## 2021-10-29 ENCOUNTER — Encounter (HOSPITAL_COMMUNITY): Payer: Self-pay | Admitting: Cardiology

## 2021-10-29 ENCOUNTER — Other Ambulatory Visit (HOSPITAL_COMMUNITY): Payer: Self-pay | Admitting: Internal Medicine

## 2021-12-01 ENCOUNTER — Other Ambulatory Visit (HOSPITAL_COMMUNITY): Payer: Self-pay | Admitting: Internal Medicine

## 2021-12-17 ENCOUNTER — Other Ambulatory Visit: Payer: Self-pay | Admitting: Nurse Practitioner

## 2022-01-29 ENCOUNTER — Other Ambulatory Visit (HOSPITAL_COMMUNITY): Payer: Self-pay | Admitting: Internal Medicine

## 2022-06-21 NOTE — Progress Notes (Incomplete)
Patient Care Team: [name], DO as PCP - General (Family Medicine) Bensimhon, Shaune Pascal, MD as PCP - Advanced Heart Failure (Cardiology) Sueanne Margarita, MD as PCP - Sleep Medicine (Cardiology) [name], MD as Consulting Physician (General Surgery) [name], MD as Consulting Physician (Hematology and Oncology) Kyung Rudd, MD as Consulting Physician (Radiation Oncology)  DIAGNOSIS: No diagnosis found.  SUMMARY OF ONCOLOGIC HISTORY: Oncology History  Malignant neoplasm of upper-inner quadrant of right breast in female, estrogen receptor positive ([place])  04/17/2018 Initial Diagnosis   Palpable right breast masses UOQ and UIQ with multiple masses 12 to 14 cm, at 1:00 1.6 cm, 1231.8 cm, and o'clock 4.5 cm and at 9:30 position 5 cm: Biopsy revealed IDC with DCIS, grade 3, ER 80%, PR 0%, HER-2 negative, Ki-67 20%; biopsy of the 4.5 cm mass came back as IDC grade 2-3, ER 50%, PR 5%, HER-2 -2+ ratio 1.6, copy #3.2, Ki-67 20%, T2N1 stage IIb   05/11/2018 - 01/17/2020 Chemotherapy   dexamethasone (DECADRON) 4 MG tablet, 4 mg (100 % of original dose 4 mg), Oral, Daily, 1 of 1 cycle, Start date: 04/25/2018, End date: 07/10/2018. Dose modification: 4 mg (original dose 4 mg, Cycle 0)  DOXOrubicin (ADRIAMYCIN) chemo injection 104 mg, 60 mg/m2 = 104 mg, Intravenous,  Once, 4 of 4 cycles. Administration: 104 mg (05/11/2018), 104 mg (05/23/2018), 104 mg (06/06/2018), 104 mg (06/22/2018)  palonosetron (ALOXI) injection 0.25 mg, 0.25 mg, Intravenous,  Once, 4 of 4 cycles. Administration: 0.25 mg (05/11/2018), 0.25 mg (05/23/2018), 0.25 mg (06/06/2018), 0.25 mg (06/22/2018)  pegfilgrastim-cbqv (UDENYCA) injection 6 mg, 6 mg, Subcutaneous, Once, 4 of 4 cycles. Administration: 6 mg (05/12/2018), 6 mg (05/24/2018), 6 mg (06/07/2018), 6 mg (06/23/2018)  cyclophosphamide (CYTOXAN) 1,040 mg in sodium chloride 0.9 % 250 mL chemo infusion, 600 mg/m2 = 1,040 mg, Intravenous,  Once, 4 of 4 cycles.  Administration: 1,040 mg (05/11/2018), 1,040 mg (05/23/2018), 1,040 mg (06/06/2018), 1,040 mg (06/22/2018)  PACLitaxel (TAXOL) 138 mg in sodium chloride 0.9 % 250 mL chemo infusion (</= 27m/m2), 80 mg/m2 = 138 mg, Intravenous,  Once, 11 of 12 cycles. Administration: 138 mg (07/10/2018), 138 mg (07/17/2018), 138 mg (07/24/2018), 138 mg (08/14/2018), 138 mg (08/22/2018), 138 mg (08/28/2018), 138 mg (09/04/2018), 138 mg (09/11/2018), 138 mg (09/18/2018), 138 mg (09/25/2018), 138 mg (10/02/2018)  fosaprepitant (EMEND) 150 mg  dexamethasone (DECADRON) 12 mg in sodium chloride 0.9 % 145 mL IVPB, , Intravenous,  Once, 4 of 4 cycles. Administration:  (05/11/2018),  (05/23/2018),  (06/06/2018),  (06/22/2018)  trastuzumab (HERCEPTIN) 546 mg in sodium chloride 0.9 % 250 mL chemo infusion, 8 mg/kg = 546 mg, Intravenous,  Once, 3 of 3 cycles. Administration: 546 mg (12/14/2018), 399 mg (01/03/2019), 399 mg (01/24/2019)  trastuzumab-dkst (OGIVRI) 399 mg in sodium chloride 0.9 % 250 mL chemo infusion, 6 mg/kg = 399 mg (100 % of original dose 6 mg/kg), Intravenous,  Once, 14 of 14 cycles. Dose modification: 6 mg/kg (original dose 6 mg/kg, Cycle 4, Reason: Other (see comments), Comment: Biosimilar Conversion), 6 mg/kg (original dose 6 mg/kg, Cycle 15, Reason: Other (see comments), Comment: weight increase). 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  The Multi-Gene Panel offered by [facility] includes sequencing and/or deletion duplication testing of the following 84 genes: AIP, ALK, APC, ATM, AXIN2,BAP1,  BARD1, BLM, BMPR1A, BRCA1, BRCA2, BRIP1, CASR, CDC73, CDH1, CDK4, CDKN1B, CDKN1C,  CDKN2A (p14ARF), CDKN2A (p16INK4a), CEBPA, CHEK2, CTNNA1, DICER1, DIS3L2, EGFR (c.2369C>T, p.Thr790Met variant only), EPCAM (Deletion/duplication testing only), FH, FLCN, GATA2, GPC3, GREM1 (Promoter region deletion/duplication testing only), HOXB13 (c.251G>A, p.Gly84Glu), HRAS, KIT, MAX, MEN1, MET, MITF (c.952G>A, p.Glu318Lys variant only), MLH1, MSH2, MSH3, MSH6, MUTYH, NBN, NF1, NF2, NTHL1, PALB2, PDGFRA, PHOX2B, PMS2, POLD1, POLE, POT1, PRKAR1A, PTCH1, PTEN, RAD50, RAD51C, RAD51D, RB1, RECQL4, RET, RUNX1, SDHAF2, SDHA (sequence changes only), SDHB, SDHC, SDHD, SMAD4, SMARCA4, SMARCB1, SMARCE1, STK11, SUFU, TERC, TERT, TMEM127, TP53, TSC1, TSC2, VHL, WRN and WT1.  The report date is 06/19/2018.   10/11/2018 Breast MRI   Significant decrease in the size of the right axillary adenopathy and right breast masses from 1.2cm to 0.9cm with minimal residual enhancement, and 2.4x1.4cm to 2.3x1.9cm.   11/20/2018 Surgery   Right mastectomy ([place]) ([id number]): Residual grade 2 invasive ductal carcinoma multifocal, largest 1.8 cm, intermediate grade DCIS, margins negative, lymphovascular space invasion present, 6 lymph nodes negative, ER 60%, PR 60%, HER-2 positive ratio 2.59   11/20/2018 Cancer Staging   Staging form: Breast, AJCC 8th Edition - Pathologic stage from 11/20/2018: Stage IA (pT1c, pN0, cM0, G2, ER+, PR+, HER2+)   01/01/2019 - 02/14/2019 Radiation Therapy   The patient initially received a dose of 50.4 Gy in 28 fractions to the chest wall and supraclavicular region. This was delivered using a 3-D conformal, 4 field technique. The patient then received a boost to the mastectomy scar. This delivered an additional 10 Gy in 5 fractions using an en face electron field. The total dose was 60.4 Gy.   03/2019 -  Anti-estrogen oral therapy   Tamoxifen daily     CHIEF COMPLIANT: Follow-up on tamoxifen therapy   INTERVAL HISTORY: ALAISA MOFFITT is a 37 year old above-mentioned history of right breast  cancer treated with neoadjuvant chemotherapy followed by right mastectomy. She presents to the clinic for a follow-up.    ALLERGIES:  has No Known Allergies.  MEDICATIONS:  Current Outpatient Medications  Medication Sig Dispense Refill   spironolactone (ALDACTONE) 25 MG tablet TAKE 1/2 (ONE-HALF) TABLET BY MOUTH ONCE DAILY (ABSOLUTE  LAST  REFILL  WITHOUT  OFFICE  VISIT  PLEASE  CALL  [phone]  TO  SCHEDULE) 15 tablet 0   tamoxifen (NOLVADEX) 20 MG tablet Take 1 tablet (20 mg total) by mouth daily. 90 tablet 4   No current facility-administered medications for this visit.    PHYSICAL EXAMINATION: ECOG PERFORMANCE STATUS: {CHL ONC ECOG PS:[id number]}  There were no vitals filed for this visit. There were no vitals filed for this visit.  BREAST:*** No palpable masses or nodules in either right or left breasts. No palpable axillary supraclavicular or infraclavicular adenopathy no breast tenderness or nipple discharge. (exam performed in the presence of a chaperone)  LABORATORY DATA:  I have reviewed the data as listed    Latest Ref Rng & Units 01/17/2020    9:17 AM 12/27/2019   10:41 AM 11/07/2019    8:25 AM  CMP  Glucose 70 - 99 mg/dL 86  83  86   BUN 6 - 20 mg/dL _0 Creatinine 0.44 - 1.00 mg/dL 0.78  0.82  0.75   Sodium 135 - 145 mmol/L 140  139  140   Potassium 3.5 - 5.1 mmol/L  4.0  3.3  3.9   Chloride 98 - 111 mmol/L 109  109  108   CO2 22 - 32 mmol/L _0 Calcium 8.9 - 10.3 mg/dL 9.6  9.1  8.9   Total Protein 6.5 - 8.1 g/dL 7.3  7.6  7.1   Total Bilirubin 0.3 - 1.2 mg/dL 0.3  0.4  0.3   Alkaline Phos 38 - 126 U/L 48  45  51   AST 15 - 41 U/L _1 ALT 0 - 44 U/L _2 Lab Results  Component Value Date   WBC 3.1 (L) 01/17/2020   HGB 13.0 01/17/2020   HCT 40.4 01/17/2020   MCV 89.6 01/17/2020   PLT 159 01/17/2020   NEUTROABS 1.3 (L) 01/17/2020    ASSESSMENT & PLAN:  No problem-specific Assessment & Plan notes found for this  encounter.    No orders of the defined types were placed in this encounter.  The patient has a good understanding of the overall plan. she agrees with it. she will call with any problems that may develop before the next visit here. Total time spent: 30 mins including face to face time and time spent for planning, charting and co-ordination of care   Suzzette Righter, [place] 06/21/22    I Gardiner Coins am acting as a Education administrator for [employer]  ***

## 2022-06-29 ENCOUNTER — Ambulatory Visit: Payer: BC Managed Care – PPO | Admitting: Hematology and Oncology

## 2022-07-04 ENCOUNTER — Inpatient Hospital Stay: Payer: BC Managed Care – PPO | Attending: Hematology and Oncology | Admitting: Hematology and Oncology

## 2022-07-04 ENCOUNTER — Other Ambulatory Visit: Payer: Self-pay

## 2022-07-04 VITALS — BP 157/105 | HR 113 | Temp 97.7°F | Resp 16 | Wt 178.8 lb

## 2022-07-04 DIAGNOSIS — Z170 Estrogen receptor positive status [ER+]: Secondary | ICD-10-CM | POA: Diagnosis not present

## 2022-07-04 DIAGNOSIS — Z79810 Long term (current) use of selective estrogen receptor modulators (SERMs): Secondary | ICD-10-CM | POA: Diagnosis not present

## 2022-07-04 DIAGNOSIS — C50211 Malignant neoplasm of upper-inner quadrant of right female breast: Secondary | ICD-10-CM | POA: Insufficient documentation

## 2022-07-04 MED ORDER — TAMOXIFEN CITRATE 20 MG PO TABS: 20.0000 mg | ORAL_TABLET | Freq: Every day | ORAL | 4 refills | Status: DC

## 2022-07-04 NOTE — Assessment & Plan Note (Addendum)
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  Neoadjuvant chemotherapy with dose dense Adriamycin and Cytoxan x4 followed by Taxol weekly x11 05/09/2018-10/02/2018 2.  11/20/2018: Right mastectomy and axillary lymph node dissection 3.  Since the final path was HER-2 positive she will get 1 year of Herceptin completed 01/17/20 4.  Adjuvant radiation 01/01/2019-02/14/2019 5.  Followed by adjuvant antiestrogen therapy patient prefers tamoxifen starting 03/28/2019 ----------------------------------------------------------- 11/20/2018:Right mastectomy: Residual grade 2 invasive ductal carcinoma multifocal, largest 1.8 cm, intermediate grade DCIS, margins negative, lymphovascular space invasion present, 0/6 lymph nodes negative, ER 60%, PR 60%, HER-2 positive ratio 2.59   Current treatment: Tamoxifen 20 mg daily 03/28/2019    Tamoxifen toxicities: 4-5 times per day hot flashes manageable.  Will continue this daily.   Breast cancer surveillance:  Left mammogram 12/28/20: Benign breast density category B, prior right mastectomy Breast Exam: 07/04/2022: Benign, right chest wall scar is without any lumps or nodules.   Requested a mammogram to be done this year. Return to clinic in 1 year for follow-up

## 2022-07-04 NOTE — Progress Notes (Signed)
Patient Care Team: [name], DO as PCP - General (Family Medicine) Bensimhon, Shaune Pascal, MD as PCP - Advanced Heart Failure (Cardiology) Sueanne Margarita, MD as PCP - Sleep Medicine (Cardiology) [name], MD as Consulting Physician (General Surgery) [name], MD as Consulting Physician (Hematology and Oncology) Kyung Rudd, MD as Consulting Physician (Radiation Oncology)  DIAGNOSIS:  Encounter Diagnosis  Name Primary?   Malignant neoplasm of upper-inner quadrant of right breast in female, estrogen receptor positive ([place]) Yes    SUMMARY OF ONCOLOGIC HISTORY: Oncology History  Malignant neoplasm of upper-inner quadrant of right breast in female, estrogen receptor positive ([place])  04/17/2018 Initial Diagnosis   Palpable right breast masses UOQ and UIQ with multiple masses 12 to 14 cm, at 1:00 1.6 cm, 1231.8 cm, and o'clock 4.5 cm and at 9:30 position 5 cm: Biopsy revealed IDC with DCIS, grade 3, ER 80%, PR 0%, HER-2 negative, Ki-67 20%; biopsy of the 4.5 cm mass came back as IDC grade 2-3, ER 50%, PR 5%, HER-2 -2+ ratio 1.6, copy #3.2, Ki-67 20%, T2N1 stage IIb   05/11/2018 - 01/17/2020 Chemotherapy   dexamethasone (DECADRON) 4 MG tablet, 4 mg (100 % of original dose 4 mg), Oral, Daily, 1 of 1 cycle, Start date: 04/25/2018, End date: 07/10/2018. Dose modification: 4 mg (original dose 4 mg, Cycle 0)  DOXOrubicin (ADRIAMYCIN) chemo injection 104 mg, 60 mg/m2 = 104 mg, Intravenous,  Once, 4 of 4 cycles. Administration: 104 mg (05/11/2018), 104 mg (05/23/2018), 104 mg (06/06/2018), 104 mg (06/22/2018)  palonosetron (ALOXI) injection 0.25 mg, 0.25 mg, Intravenous,  Once, 4 of 4 cycles. Administration: 0.25 mg (05/11/2018), 0.25 mg (05/23/2018), 0.25 mg (06/06/2018), 0.25 mg (06/22/2018)  pegfilgrastim-cbqv (UDENYCA) injection 6 mg, 6 mg, Subcutaneous, Once, 4 of 4 cycles. Administration: 6 mg (05/12/2018), 6 mg (05/24/2018), 6 mg (06/07/2018), 6 mg  (06/23/2018)  cyclophosphamide (CYTOXAN) 1,040 mg in sodium chloride 0.9 % 250 mL chemo infusion, 600 mg/m2 = 1,040 mg, Intravenous,  Once, 4 of 4 cycles. Administration: 1,040 mg (05/11/2018), 1,040 mg (05/23/2018), 1,040 mg (06/06/2018), 1,040 mg (06/22/2018)  PACLitaxel (TAXOL) 138 mg in sodium chloride 0.9 % 250 mL chemo infusion (</= '80mg'$ /m2), 80 mg/m2 = 138 mg, Intravenous,  Once, 11 of 12 cycles. Administration: 138 mg (07/10/2018), 138 mg (07/17/2018), 138 mg (07/24/2018), 138 mg (08/14/2018), 138 mg (08/22/2018), 138 mg (08/28/2018), 138 mg (09/04/2018), 138 mg (09/11/2018), 138 mg (09/18/2018), 138 mg (09/25/2018), 138 mg (10/02/2018)  fosaprepitant (EMEND) 150 mg  dexamethasone (DECADRON) 12 mg in sodium chloride 0.9 % 145 mL IVPB, , Intravenous,  Once, 4 of 4 cycles. Administration:  (05/11/2018),  (05/23/2018),  (06/06/2018),  (06/22/2018)  trastuzumab (HERCEPTIN) 546 mg in sodium chloride 0.9 % 250 mL chemo infusion, 8 mg/kg = 546 mg, Intravenous,  Once, 3 of 3 cycles. Administration: 546 mg (12/14/2018), 399 mg (01/03/2019), 399 mg (01/24/2019)  trastuzumab-dkst (OGIVRI) 399 mg in sodium chloride 0.9 % 250 mL chemo infusion, 6 mg/kg = 399 mg (100 % of original dose 6 mg/kg), Intravenous,  Once, 14 of 14 cycles. Dose modification: 6 mg/kg (original dose 6 mg/kg, Cycle 4, Reason: Other (see comments), Comment: Biosimilar Conversion), 6 mg/kg (original dose 6 mg/kg, Cycle 15, Reason: Other (see comments), Comment: weight increase). 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  The Multi-Gene Panel offered by [facility] includes sequencing and/or deletion  duplication testing of the following 84 genes: AIP, ALK, APC, ATM, AXIN2,BAP1,  BARD1, BLM, BMPR1A, BRCA1, BRCA2, BRIP1, CASR, CDC73, CDH1, CDK4, CDKN1B, CDKN1C, CDKN2A (p14ARF), CDKN2A (p16INK4a), CEBPA, CHEK2, CTNNA1, DICER1, DIS3L2, EGFR (c.2369C>T, p.Thr790Met variant only), EPCAM (Deletion/duplication testing only), FH, FLCN, GATA2, GPC3, GREM1 (Promoter region deletion/duplication testing only), HOXB13 (c.251G>A, p.Gly84Glu), HRAS, KIT, MAX, MEN1, MET, MITF (c.952G>A, p.Glu318Lys variant only), MLH1, MSH2, MSH3, MSH6, MUTYH, NBN, NF1, NF2, NTHL1, PALB2, PDGFRA, PHOX2B, PMS2, POLD1, POLE, POT1, PRKAR1A, PTCH1, PTEN, RAD50, RAD51C, RAD51D, RB1, RECQL4, RET, RUNX1, SDHAF2, SDHA (sequence changes only), SDHB, SDHC, SDHD, SMAD4, SMARCA4, SMARCB1, SMARCE1, STK11, SUFU, TERC, TERT, TMEM127, TP53, TSC1, TSC2, VHL, WRN and WT1.  The report date is 06/19/2018.   10/11/2018 Breast MRI   Significant decrease in the size of the right axillary adenopathy and right breast masses from 1.2cm to 0.9cm with minimal residual enhancement, and 2.4x1.4cm to 2.3x1.9cm.   11/20/2018 Surgery   Right mastectomy ([place]) ([id number]): Residual grade 2 invasive ductal carcinoma multifocal, largest 1.8 cm, intermediate grade DCIS, margins negative, lymphovascular space invasion present, 6 lymph nodes negative, ER 60%, PR 60%, HER-2 positive ratio 2.59   11/20/2018 Cancer Staging   Staging form: Breast, AJCC 8th Edition - Pathologic stage from 11/20/2018: Stage IA (pT1c, pN0, cM0, G2, ER+, PR+, HER2+)   01/01/2019 - 02/14/2019 Radiation Therapy   The patient initially received a dose of 50.4 Gy in 28 fractions to the chest wall and supraclavicular region. This was delivered using a 3-D conformal, 4 field technique. The patient then received a boost to the mastectomy scar. This delivered an additional 10 Gy in 5 fractions using an en face electron field. The total dose was 60.4 Gy.   03/2019 -  Anti-estrogen oral therapy   Tamoxifen  daily     CHIEF COMPLIANT: Follow-up on tamoxifen therapy   INTERVAL HISTORY: Miranda Ayers is a 37 year old above-mentioned history of right breast cancer treated with neoadjuvant chemotherapy followed by right mastectomy.  She had residual tumor.  Her cancer was ER positive PR positive HER2 positive.  She completed radiation therapy and is currently on tamoxifen. She presents to the clinic for a follow-up. She states that her health is good. She said her cycle has resumed on Oct 2023. She states that it is very heavy. She says this month it was very clotted. She reports that it is lasting the whole 5 days. She does have hot flashes but she they are manageable.    ALLERGIES:  has No Known Allergies.  MEDICATIONS:  Current Outpatient Medications  Medication Sig Dispense Refill   spironolactone (ALDACTONE) 25 MG tablet TAKE 1/2 (ONE-HALF) TABLET BY MOUTH ONCE DAILY (ABSOLUTE  LAST  REFILL  WITHOUT  OFFICE  VISIT  PLEASE  CALL  [phone]  TO  SCHEDULE) 15 tablet 0   tamoxifen (NOLVADEX) 20 MG tablet Take 1 tablet (20 mg total) by mouth daily. 90 tablet 4   No current facility-administered medications for this visit.    PHYSICAL EXAMINATION: ECOG PERFORMANCE STATUS: 1 - Symptomatic but completely ambulatory  Vitals:   07/04/22 1514  BP: (!) 157/105  Pulse: (!) 113  Resp: 16  Temp: 97.7 F (36.5 C)  SpO2: 100%   Filed Weights   07/04/22 1514  Weight: 178 lb 12.8 oz (81.1 kg)  BREAST: Right mastectomy without any palpable lumps or nodules.  Left breast is without lumps or nodules.. (exam performed in the presence of a chaperone)  LABORATORY DATA:  I have reviewed the data as listed    Latest Ref Rng & Units 01/17/2020    9:17 AM 12/27/2019   10:41 AM 11/07/2019    8:25 AM  CMP  Glucose 70 - 99 mg/dL 86  83  86   BUN 6 - 20 mg/dL '11  10  9   '$ Creatinine 0.44 - 1.00 mg/dL 0.78  0.82  0.75   Sodium 135 - 145 mmol/L 140  139  140   Potassium 3.5 - 5.1 mmol/L 4.0  3.3   3.9   Chloride 98 - 111 mmol/L 109  109  108   CO2 22 - 32 mmol/L '22  22  24   '$ Calcium 8.9 - 10.3 mg/dL 9.6  9.1  8.9   Total Protein 6.5 - 8.1 g/dL 7.3  7.6  7.1   Total Bilirubin 0.3 - 1.2 mg/dL 0.3  0.4  0.3   Alkaline Phos 38 - 126 U/L 48  45  51   AST 15 - 41 U/L '14  14  16   '$ ALT 0 - 44 U/L '12  11  12     '$ Lab Results  Component Value Date   WBC 3.1 (L) 01/17/2020   HGB 13.0 01/17/2020   HCT 40.4 01/17/2020   MCV 89.6 01/17/2020   PLT 159 01/17/2020   NEUTROABS 1.3 (L) 01/17/2020    ASSESSMENT & PLAN:  Malignant neoplasm of upper-inner quadrant of right breast in female, estrogen receptor positive ([place]) 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%, PR 0%, HER-2 negative, Ki-67 20%; biopsy of the 4.5 cm mass came back as IDC grade 2-3, ER 50%, PR 5%, HER-2 -2+ ratio 1.6, copy #3.2, Ki-67 20%, T2N1 stage IIb   Recommendation: 1.  Neoadjuvant chemotherapy with dose dense Adriamycin and Cytoxan x4 followed by Taxol weekly x11 05/09/2018-10/02/2018 2.  11/20/2018: Right mastectomy and axillary lymph node dissection 3.  Since the final path was HER-2 positive she will get 1 year of Herceptin completed 01/17/20 4.  Adjuvant radiation 01/01/2019-02/14/2019 5.  Followed by adjuvant antiestrogen therapy patient prefers tamoxifen starting 03/28/2019 ----------------------------------------------------------- 11/20/2018:Right mastectomy: Residual grade 2 invasive ductal carcinoma multifocal, largest 1.8 cm, intermediate grade DCIS, margins negative, lymphovascular space invasion present, 0/6 lymph nodes negative, ER 60%, PR 60%, HER-2 positive ratio 2.59   Current treatment: Tamoxifen 20 mg daily 03/28/2019    Tamoxifen toxicities:  Hot flashes: Moderate  Menstrual cycles have resumed and the seem to be heavier.  I instructed her to see  gynecology.  Breast cancer surveillance:  Left mammogram 12/28/20: Benign breast density category B, prior right mastectomy Breast Exam: 07/04/2022: Benign, right chest wall scar is without any lumps or nodules.   Requested a mammogram to be done this year. Return to clinic in 1 year for follow-up    Orders Placed This Encounter  Procedures   MM DIAG BREAST TOMO UNI LEFT    Standing Status:   Future    Standing Expiration Date:   07/05/2023    Order Specific Question:   Reason for Exam (SYMPTOM  OR DIAGNOSIS REQUIRED)    Answer:   annual left mammogram  Order Specific Question:   Is the patient pregnant?    Answer:   No    Order Specific Question:   Preferred imaging location?    Answer:   [facility]   The patient has a good understanding of the overall plan. she agrees with it. she will call with any problems that may develop before the next visit here. Total time spent: 30 mins including face to face time and time spent for planning, charting and co-ordination of care   Harriette Ohara, MD 07/04/22    I Gardiner Coins am acting as a Education administrator for [employer]  I have reviewed the above documentation for accuracy and completeness, and I agree with the above.

## 2022-07-05 ENCOUNTER — Telehealth: Payer: Self-pay | Admitting: Hematology and Oncology

## 2022-07-05 NOTE — Telephone Encounter (Signed)
Scheduled appointment per 1/15 los. Patient is aware.

## 2022-07-18 ENCOUNTER — Ambulatory Visit
Admission: RE | Admit: 2022-07-18 | Discharge: 2022-07-18 | Disposition: A | Discharge status: Home / Self Care | Payer: BC Managed Care – PPO | Source: Ambulatory Visit | Attending: Hematology and Oncology | Admitting: Hematology and Oncology

## 2022-07-18 DIAGNOSIS — R928 Other abnormal and inconclusive findings on diagnostic imaging of breast: Secondary | ICD-10-CM | POA: Diagnosis not present

## 2022-07-18 DIAGNOSIS — Z170 Estrogen receptor positive status [ER+]: Secondary | ICD-10-CM

## 2022-08-15 DIAGNOSIS — E559 Vitamin D deficiency, unspecified: Secondary | ICD-10-CM | POA: Diagnosis not present

## 2022-08-15 DIAGNOSIS — Z124 Encounter for screening for malignant neoplasm of cervix: Secondary | ICD-10-CM | POA: Diagnosis not present

## 2022-08-15 DIAGNOSIS — Z0000 Encounter for general adult medical examination without abnormal findings: Secondary | ICD-10-CM | POA: Diagnosis not present

## 2022-08-15 DIAGNOSIS — Z01419 Encounter for gynecological examination (general) (routine) without abnormal findings: Secondary | ICD-10-CM | POA: Diagnosis not present

## 2022-08-15 DIAGNOSIS — Z1331 Encounter for screening for depression: Secondary | ICD-10-CM | POA: Diagnosis not present

## 2022-09-19 ENCOUNTER — Telehealth: Payer: Self-pay

## 2022-09-19 ENCOUNTER — Encounter: Payer: Self-pay | Admitting: Hematology and Oncology

## 2022-09-19 NOTE — Telephone Encounter (Signed)
Received call from Pt regarding latest gynecology visit. Pt states she was told by Dr. Lindi Adie to have a "special papsmear" that her gynecologist expressed confusion over. Pt states her gynecologist performed a "regular papsmear". After clarifying with Dr. Lindi Adie, Pt is to ask gynecology for a pelvic US with endometrial bx r/t to her tamoxifen use as it can cause endometrial thickening. Advised Pt to write down Dr. Geralyn Flash recommendation to pass along to gynecology. Advised that if gynecology still had questions, to give [place] another call. Pt verbalized understanding.

## 2022-10-17 ENCOUNTER — Other Ambulatory Visit (HOSPITAL_COMMUNITY)
Admission: RE | Admit: 2022-10-17 | Discharge: 2022-10-17 | Disposition: A | Discharge status: Home / Self Care | Payer: BC Managed Care – PPO | Source: Ambulatory Visit | Attending: Obstetrics & Gynecology | Admitting: Obstetrics & Gynecology

## 2022-10-17 ENCOUNTER — Encounter: Payer: Self-pay | Admitting: Obstetrics & Gynecology

## 2022-10-17 ENCOUNTER — Ambulatory Visit: Payer: BC Managed Care – PPO | Admitting: Obstetrics & Gynecology

## 2022-10-17 VITALS — BP 155/105 | HR 95 | Ht 61.0 in | Wt 180.0 lb

## 2022-10-17 DIAGNOSIS — Z801 Family history of malignant neoplasm of trachea, bronchus and lung: Secondary | ICD-10-CM | POA: Diagnosis not present

## 2022-10-17 DIAGNOSIS — Z1379 Encounter for other screening for genetic and chromosomal anomalies: Secondary | ICD-10-CM

## 2022-10-17 DIAGNOSIS — R58 Hemorrhage, not elsewhere classified: Secondary | ICD-10-CM | POA: Diagnosis not present

## 2022-10-17 DIAGNOSIS — Z170 Estrogen receptor positive status [ER+]: Secondary | ICD-10-CM | POA: Diagnosis not present

## 2022-10-17 DIAGNOSIS — C50211 Malignant neoplasm of upper-inner quadrant of right female breast: Secondary | ICD-10-CM

## 2022-10-17 DIAGNOSIS — N841 Polyp of cervix uteri: Secondary | ICD-10-CM | POA: Diagnosis not present

## 2022-10-17 DIAGNOSIS — Z853 Personal history of malignant neoplasm of breast: Secondary | ICD-10-CM | POA: Diagnosis not present

## 2022-10-17 DIAGNOSIS — Z803 Family history of malignant neoplasm of breast: Secondary | ICD-10-CM | POA: Diagnosis not present

## 2022-10-17 DIAGNOSIS — Z3202 Encounter for pregnancy test, result negative: Secondary | ICD-10-CM | POA: Diagnosis not present

## 2022-10-17 DIAGNOSIS — Z800 Family history of malignant neoplasm of digestive organs: Secondary | ICD-10-CM | POA: Diagnosis not present

## 2022-10-17 DIAGNOSIS — N939 Abnormal uterine and vaginal bleeding, unspecified: Secondary | ICD-10-CM

## 2022-10-17 DIAGNOSIS — Z808 Family history of malignant neoplasm of other organs or systems: Secondary | ICD-10-CM | POA: Diagnosis not present

## 2022-10-17 LAB — POCT URINE PREGNANCY: Preg Test, Ur: NEGATIVE

## 2022-10-17 NOTE — Progress Notes (Signed)
    GYNECOLOGY PROGRESS NOTE  Subjective:    Patient ID: Miranda Ayers, female    DOB: 1985/11/01, 37 y.o.   MRN: [id number]  HPI  Patient is a 37 y.o. P66 (61 yo daughter) who presents for a consult regarding her period. She was diagnosed with ER + breast cancer at age 7. She had RT, surgery, and chemo. She had no period from 2018 until she restarted monthly 03/2022 bleeding which lasts 5 days each month. She uses condoms and withdrawal for contraception. She thinks that she had genetic testing that was negative in the past.  The following portions of the patient's history were reviewed and updated as appropriate: allergies, current medications, past family history, past medical history, past social history, past surgical history, and problem list.  Review of Systems Pertinent items are noted in HPI.  She reports a normal pap smear in [facility] 07/2022.  Objective:   Blood pressure (!) 172/111, pulse 95, height 5\' 1"  (1.549 m), weight 180 lb (81.6 kg), last menstrual period 10/04/2022. Body mass index is 34.01 kg/m. Well nourished, well hydrated Black female, no apparent distress She is ambulating and conversing normally.    UPT negative, consent signed, time out done Cervix prepped with betadine and sprayed with Hurricaine spray. I then grasped with a single tooth tenaculum. Uterus sounded to 9 cm Pipelle used for 2 passes with a scant amount of tissue. A gritty sensation was appreciated She tolerated the procedure well.      Assessment:   Bleeding after extended period of amenorrhea (after chemo) I suspect that this is merely a return to her periods, but will do an [facility] and check FSH I don't see genetic testing in her chart. I called [employer] ([employer]). They do not have any record of her having the testing. I will send the blood.  Plan:  As above

## 2022-10-17 NOTE — Addendum Note (Signed)
Addended by: Loney Laurence on: 10/17/2022 02:31 PM   Modules accepted: Orders

## 2022-10-18 ENCOUNTER — Encounter: Payer: Self-pay | Admitting: Obstetrics & Gynecology

## 2022-10-18 LAB — FOLLICLE STIMULATING HORMONE: FSH: 24 m[IU]/mL

## 2022-10-19 DIAGNOSIS — Z1371 Encounter for nonprocreative screening for genetic disease carrier status: Secondary | ICD-10-CM

## 2022-10-19 HISTORY — DX: Encounter for nonprocreative screening for genetic disease carrier status: Z13.71

## 2022-10-19 LAB — SURGICAL PATHOLOGY

## 2022-10-24 ENCOUNTER — Encounter: Payer: Self-pay | Admitting: Obstetrics & Gynecology

## 2022-10-24 ENCOUNTER — Other Ambulatory Visit: Payer: Self-pay | Admitting: Obstetrics & Gynecology

## 2022-10-24 DIAGNOSIS — R58 Hemorrhage, not elsewhere classified: Secondary | ICD-10-CM

## 2022-10-24 NOTE — Progress Notes (Signed)
Pelvic ultrasound ordered to further evaluate bleeding after no period for several years s/p chemo with FSH of 24.

## 2022-10-25 ENCOUNTER — Encounter: Payer: Self-pay | Admitting: Obstetrics and Gynecology

## 2022-11-02 ENCOUNTER — Ambulatory Visit (INDEPENDENT_AMBULATORY_CARE_PROVIDER_SITE_OTHER): Payer: BC Managed Care – PPO

## 2022-11-02 DIAGNOSIS — N939 Abnormal uterine and vaginal bleeding, unspecified: Secondary | ICD-10-CM | POA: Diagnosis not present

## 2022-11-11 NOTE — Progress Notes (Unsigned)
    GYNECOLOGY PROGRESS NOTE  Subjective:    Patient ID: Miranda Ayers, female    DOB: 24-Jan-1986, 37 y.o.   MRN: [id number]  HPI  Patient is a 37 y.o. No obstetric history on file. female who presents to follow up after ultrasound. She was diagnosed with ER + breast cancer at age 66. She had RT, surgery, and chemo. She had no period from 2018 until she restarted monthly 03/2022 bleeding which lasts 5 days each month.  She was seen by Dr. Marice Potter on 11/02/2022 for abnormal uterine bleeding. She ordered an ultrasound.  {Common ambulatory SmartLinks:19316}  Review of Systems {ros; complete:30496}   Objective:   Last menstrual period 10/04/2022. There is no height or weight on file to calculate BMI. General appearance: {general exam:16600} Abdomen: {abdominal exam:16834} Pelvic: {pelvic exam:16852::"cervix normal in appearance","external genitalia normal","no adnexal masses or tenderness","no cervical motion tenderness","rectovaginal septum normal","uterus normal size, shape, and consistency","vagina normal without discharge"} Extremities: {extremity exam:5109} Neurologic: {neuro exam:17854}  Ultrasound:  ULTRASOUND REPORT   Location: [facility] Date of Service: 11/02/2022     Indications:Abnormal Uterine Bleeding Findings:  The uterus is anteverted and measures 7.37 x 3.06 x 4.24 cm . Echo texture is homogenous without evidence of focal masses.   The Endometrium measures 3.07 mm. Hyperechoic foci adjacent to endo measuring 0.43 x 0.48 x 0.34 cm   Right Ovary surgically removed Left Ovary not well seen  Survey of the adnexa demonstrates no adnexal masses. There is no free fluid in the cul de sac.   Impression: 1. ? Hyperechoic foci adjacent to endo ?polyp   Recommendations: 1.Clinical correlation with the patient's History and Physical Exam. 2. Follow up with provider   Assessment:   1. Bleeding      Plan:   There are no diagnoses linked to this  encounter.    Hildred Laser, MD [facility]

## 2022-11-15 ENCOUNTER — Encounter: Payer: Self-pay | Admitting: Obstetrics and Gynecology

## 2022-11-15 ENCOUNTER — Ambulatory Visit: Payer: BC Managed Care – PPO | Admitting: Obstetrics and Gynecology

## 2022-11-15 VITALS — BP 138/83 | HR 92 | Resp 16 | Ht 61.0 in | Wt 182.7 lb

## 2022-11-15 DIAGNOSIS — N840 Polyp of corpus uteri: Secondary | ICD-10-CM

## 2022-11-15 DIAGNOSIS — N939 Abnormal uterine and vaginal bleeding, unspecified: Secondary | ICD-10-CM

## 2022-11-15 DIAGNOSIS — Z853 Personal history of malignant neoplasm of breast: Secondary | ICD-10-CM | POA: Diagnosis not present

## 2022-11-15 DIAGNOSIS — R58 Hemorrhage, not elsewhere classified: Secondary | ICD-10-CM

## 2022-11-16 ENCOUNTER — Telehealth: Payer: Self-pay | Admitting: Obstetrics and Gynecology

## 2022-11-16 NOTE — Telephone Encounter (Signed)
Miranda Ayers 11/10/22. LM with neg [employer] results today. Pt with hx of breast cancer. No further screening recommendations at this time except that which is recommended by oncology.   Patient understands these results only apply to her and her children.  Pt also understands negative genetic testing doesn't mean she will never get any of these cancers.   Hard copy mailed to pt. F/u prn questions.

## 2023-02-01 DIAGNOSIS — E559 Vitamin D deficiency, unspecified: Secondary | ICD-10-CM | POA: Diagnosis not present

## 2023-06-05 ENCOUNTER — Other Ambulatory Visit: Payer: Self-pay | Admitting: Hematology and Oncology

## 2023-06-05 ENCOUNTER — Telehealth: Payer: Self-pay | Admitting: Hematology and Oncology

## 2023-06-05 DIAGNOSIS — Z1231 Encounter for screening mammogram for malignant neoplasm of breast: Secondary | ICD-10-CM

## 2023-06-05 NOTE — Telephone Encounter (Signed)
Left patient a message in regards to rescheduled appointment times/dates due to him being in Breast Clinic

## 2023-07-05 ENCOUNTER — Ambulatory Visit: Payer: BC Managed Care – PPO | Admitting: Hematology and Oncology

## 2023-07-06 ENCOUNTER — Inpatient Hospital Stay: Payer: BC Managed Care – PPO | Attending: Hematology and Oncology | Admitting: Hematology and Oncology

## 2023-07-06 VITALS — BP 159/100 | HR 113 | Temp 97.8°F | Resp 18 | Ht 61.0 in | Wt 177.1 lb

## 2023-07-06 DIAGNOSIS — Z9011 Acquired absence of right breast and nipple: Secondary | ICD-10-CM | POA: Insufficient documentation

## 2023-07-06 DIAGNOSIS — Z79810 Long term (current) use of selective estrogen receptor modulators (SERMs): Secondary | ICD-10-CM | POA: Insufficient documentation

## 2023-07-06 DIAGNOSIS — Z170 Estrogen receptor positive status [ER+]: Secondary | ICD-10-CM | POA: Diagnosis not present

## 2023-07-06 DIAGNOSIS — Z923 Personal history of irradiation: Secondary | ICD-10-CM | POA: Insufficient documentation

## 2023-07-06 DIAGNOSIS — C50211 Malignant neoplasm of upper-inner quadrant of right female breast: Secondary | ICD-10-CM | POA: Diagnosis not present

## 2023-07-06 DIAGNOSIS — Z9221 Personal history of antineoplastic chemotherapy: Secondary | ICD-10-CM | POA: Diagnosis not present

## 2023-07-06 DIAGNOSIS — Z1721 Progesterone receptor positive status: Secondary | ICD-10-CM | POA: Insufficient documentation

## 2023-07-06 DIAGNOSIS — Z1732 Human epidermal growth factor receptor 2 negative status: Secondary | ICD-10-CM | POA: Insufficient documentation

## 2023-07-06 NOTE — Assessment & Plan Note (Signed)
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  Neoadjuvant chemotherapy with dose dense Adriamycin and Cytoxan x4 followed by Taxol weekly x11 05/09/2018-10/02/2018 2.  11/20/2018: Right mastectomy and axillary lymph node dissection 3.  Since the final path was HER-2 positive she will get 1 year of Herceptin completed 01/17/20 4.  Adjuvant radiation 01/01/2019-02/14/2019 5.  Followed by adjuvant antiestrogen therapy patient prefers tamoxifen starting 03/28/2019 ----------------------------------------------------------- 11/20/2018:Right mastectomy: Residual grade 2 invasive ductal carcinoma multifocal, largest 1.8 cm, intermediate grade DCIS, margins negative, lymphovascular space invasion present, 0/6 lymph nodes negative, ER 60%, PR 60%, HER-2 positive ratio 2.59   Current treatment: Tamoxifen 20 mg daily 03/28/2019    Tamoxifen toxicities:  Hot flashes: Moderate  Menstrual cycles have resumed and the seem to be heavier.  Pelvic ultrasound 11/13/2022: Endometrial polyp   Breast cancer surveillance:  Left mammogram 07/18/2022: Benign breast density category B, prior right mastectomy Breast Exam: 07/06/2023: Benign, right chest wall scar is without any lumps or nodules.   Mammogram scheduled for 07/20/2023 Return to clinic in 1 year for follow-up

## 2023-07-06 NOTE — Progress Notes (Signed)
Patient Care Team: Michaele Offer, DO as PCP - General (Family Medicine) Bensimhon, Bevelyn Buckles, MD as PCP - Advanced Heart Failure (Cardiology) Quintella Reichert, MD as PCP - Sleep Medicine (Cardiology) Harriette Bouillon, MD as Consulting Physician (General Surgery) Serena Croissant, MD as Consulting Physician (Hematology and Oncology) Dorothy Puffer, MD as Consulting Physician (Radiation Oncology)  DIAGNOSIS:  Encounter Diagnosis  Name Primary?   Malignant neoplasm of upper-inner quadrant of right breast in female, estrogen receptor positive (HCC) Yes    SUMMARY OF ONCOLOGIC HISTORY: Oncology History  Malignant neoplasm of upper-inner quadrant of right breast in female, estrogen receptor positive (HCC)  04/17/2018 Initial Diagnosis   Palpable right breast masses UOQ and UIQ with multiple masses 12 to 14 cm, at 1:00 1.6 cm, 1231.8 cm, and o'clock 4.5 cm and at 9:30 position 5 cm: Biopsy revealed IDC with DCIS, grade 3, ER 80%, PR 0%, HER-2 negative, Ki-67 20%; biopsy of the 4.5 cm mass came back as IDC grade 2-3, ER 50%, PR 5%, HER-2 -2+ ratio 1.6, copy #3.2, Ki-67 20%, T2N1 stage IIb   05/11/2018 - 01/17/2020 Chemotherapy   dexamethasone (DECADRON) 4 MG tablet, 4 mg (100 % of original dose 4 mg), Oral, Daily, 1 of 1 cycle, Start date: 04/25/2018, End date: 07/10/2018. Dose modification: 4 mg (original dose 4 mg, Cycle 0)  DOXOrubicin (ADRIAMYCIN) chemo injection 104 mg, 60 mg/m2 = 104 mg, Intravenous,  Once, 4 of 4 cycles. Administration: 104 mg (05/11/2018), 104 mg (05/23/2018), 104 mg (06/06/2018), 104 mg (06/22/2018)  palonosetron (ALOXI) injection 0.25 mg, 0.25 mg, Intravenous,  Once, 4 of 4 cycles. Administration: 0.25 mg (05/11/2018), 0.25 mg (05/23/2018), 0.25 mg (06/06/2018), 0.25 mg (06/22/2018)  pegfilgrastim-cbqv (UDENYCA) injection 6 mg, 6 mg, Subcutaneous, Once, 4 of 4 cycles. Administration: 6 mg (05/12/2018), 6 mg (05/24/2018), 6 mg (06/07/2018), 6 mg  (06/23/2018)  cyclophosphamide (CYTOXAN) 1,040 mg in sodium chloride 0.9 % 250 mL chemo infusion, 600 mg/m2 = 1,040 mg, Intravenous,  Once, 4 of 4 cycles. Administration: 1,040 mg (05/11/2018), 1,040 mg (05/23/2018), 1,040 mg (06/06/2018), 1,040 mg (06/22/2018)  PACLitaxel (TAXOL) 138 mg in sodium chloride 0.9 % 250 mL chemo infusion (</= 80mg /m2), 80 mg/m2 = 138 mg, Intravenous,  Once, 11 of 12 cycles. Administration: 138 mg (07/10/2018), 138 mg (07/17/2018), 138 mg (07/24/2018), 138 mg (08/14/2018), 138 mg (08/22/2018), 138 mg (08/28/2018), 138 mg (09/04/2018), 138 mg (09/11/2018), 138 mg (09/18/2018), 138 mg (09/25/2018), 138 mg (10/02/2018)  fosaprepitant (EMEND) 150 mg  dexamethasone (DECADRON) 12 mg in sodium chloride 0.9 % 145 mL IVPB, , Intravenous,  Once, 4 of 4 cycles. Administration:  (05/11/2018),  (05/23/2018),  (06/06/2018),  (06/22/2018)  trastuzumab (HERCEPTIN) 546 mg in sodium chloride 0.9 % 250 mL chemo infusion, 8 mg/kg = 546 mg, Intravenous,  Once, 3 of 3 cycles. Administration: 546 mg (12/14/2018), 399 mg (01/03/2019), 399 mg (01/24/2019)  trastuzumab-dkst (OGIVRI) 399 mg in sodium chloride 0.9 % 250 mL chemo infusion, 6 mg/kg = 399 mg (100 % of original dose 6 mg/kg), Intravenous,  Once, 14 of 14 cycles. Dose modification: 6 mg/kg (original dose 6 mg/kg, Cycle 4, Reason: Other (see comments), Comment: Biosimilar Conversion), 6 mg/kg (original dose 6 mg/kg, Cycle 15, Reason: Other (see comments), Comment: weight increase). 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  The Multi-Gene Panel offered by [facility] includes sequencing and/or deletion  duplication testing of the following 84 genes: AIP, ALK, APC, ATM, AXIN2,BAP1,  BARD1, BLM, BMPR1A, BRCA1, BRCA2, BRIP1, CASR, CDC73, CDH1, CDK4, CDKN1B, CDKN1C, CDKN2A (p14ARF), CDKN2A (p16INK4a), CEBPA, CHEK2, CTNNA1, DICER1, DIS3L2, EGFR (c.2369C>T, p.Thr790Met variant only), EPCAM (Deletion/duplication testing only), FH, FLCN, GATA2, GPC3, GREM1 (Promoter region deletion/duplication testing only), HOXB13 (c.251G>A, p.Gly84Glu), HRAS, KIT, MAX, MEN1, MET, MITF (c.952G>A, p.Glu318Lys variant only), MLH1, MSH2, MSH3, MSH6, MUTYH, NBN, NF1, NF2, NTHL1, PALB2, PDGFRA, PHOX2B, PMS2, POLD1, POLE, POT1, PRKAR1A, PTCH1, PTEN, RAD50, RAD51C, RAD51D, RB1, RECQL4, RET, RUNX1, SDHAF2, SDHA (sequence changes only), SDHB, SDHC, SDHD, SMAD4, SMARCA4, SMARCB1, SMARCE1, STK11, SUFU, TERC, TERT, TMEM127, TP53, TSC1, TSC2, VHL, WRN and WT1.  The report date is 06/19/2018.   10/11/2018 Breast MRI   Significant decrease in the size of the right axillary adenopathy and right breast masses from 1.2cm to 0.9cm with minimal residual enhancement, and 2.4x1.4cm to 2.3x1.9cm.   11/20/2018 Surgery   Right mastectomy ([facility]) ([id number]): Residual grade 2 invasive ductal carcinoma multifocal, largest 1.8 cm, intermediate grade DCIS, margins negative, lymphovascular space invasion present, 6 lymph nodes negative, ER 60%, PR 60%, HER-2 positive ratio 2.59   11/20/2018 Cancer Staging   Staging form: Breast, AJCC 8th Edition - Pathologic stage from 11/20/2018: Stage IA (pT1c, pN0, cM0, G2, ER+, PR+, HER2+)   01/01/2019 - 02/14/2019 Radiation Therapy   The patient initially received a dose of 50.4 Gy in 28 fractions to the chest wall and supraclavicular region. This was delivered using a 3-D conformal, 4 field technique. The patient then received a boost to the mastectomy scar. This delivered an additional 10 Gy in 5 fractions using an en face electron field. The total dose was 60.4 Gy.   03/2019 -  Anti-estrogen oral therapy   Tamoxifen  daily     CHIEF COMPLIANT: Follow-up on tamoxifen therapy  HISTORY OF PRESENT ILLNESS:   History of Present Illness   The patient, with a history of breast cancer, presents with concerns about uterine polyps and heavy bleeding. She reports intermittent episodes of heavy bleeding, which she describes as a "dark discharge." The frequency of these episodes varies, with some months experiencing no bleeding at all. She has not yet discussed these symptoms with her gynecologist, but plans to do so in the near future.  The patient is currently on tamoxifen, a medication known to cause uterine changes. She has been advised against hormonal birth control due to her history of hormone-driven cancer, and is considering a copper IUD as a non-hormonal option. However, she expresses some reservations about this method.  In addition to these concerns, the patient also reports muscle cramps, which she believes may be a side effect of tamoxifen. She has been managing these symptoms with home remedies, including pickle juice and mustard.         ALLERGIES:  has no known allergies.  MEDICATIONS:  Current Outpatient Medications  Medication Sig Dispense Refill   hydrochlorothiazide (HYDRODIURIL) 12.5 MG tablet 12.5 mg daily.     spironolactone (ALDACTONE) 25 MG tablet TAKE 1/2 (ONE-HALF) TABLET BY MOUTH ONCE DAILY (ABSOLUTE  LAST  REFILL  WITHOUT  OFFICE  VISIT  PLEASE  CALL  [phone]  TO  SCHEDULE) 15 tablet 0   tamoxifen (NOLVADEX) 20 MG tablet Take 1 tablet (20 mg total) by mouth daily. 90  tablet 4   No current facility-administered medications for this visit.    PHYSICAL EXAMINATION: ECOG PERFORMANCE STATUS: 1 - Symptomatic but completely ambulatory  Vitals:   07/06/23 1036  BP: (!) 159/100  Pulse: (!) 113  Resp: 18  Temp: 97.8 F (36.6 C)  SpO2: 100%   Filed Weights   07/06/23 1036  Weight: 177 lb 1.6 oz (80.3 kg)    Physical Exam   EXTREMITIES: No abnormalities noted on  examination of the arms.      (exam performed in the presence of a chaperone)  LABORATORY DATA:  I have reviewed the data as listed    Latest Ref Rng & Units 01/17/2020    9:17 AM 12/27/2019   10:41 AM 11/07/2019    8:25 AM  CMP  Glucose 70 - 99 mg/dL 86  83  86   BUN 6 - 20 mg/dL 11  10  9    Creatinine 0.44 - 1.00 mg/dL 6.44  0.34  7.42   Sodium 135 - 145 mmol/L 140  139  140   Potassium 3.5 - 5.1 mmol/L 4.0  3.3  3.9   Chloride 98 - 111 mmol/L 109  109  108   CO2 22 - 32 mmol/L 22  22  24    Calcium 8.9 - 10.3 mg/dL 9.6  9.1  8.9   Total Protein 6.5 - 8.1 g/dL 7.3  7.6  7.1   Total Bilirubin 0.3 - 1.2 mg/dL 0.3  0.4  0.3   Alkaline Phos 38 - 126 U/L 48  45  51   AST 15 - 41 U/L 14  14  16    ALT 0 - 44 U/L 12  11  12      Lab Results  Component Value Date   WBC 3.1 (L) 01/17/2020   HGB 13.0 01/17/2020   HCT 40.4 01/17/2020   MCV 89.6 01/17/2020   PLT 159 01/17/2020   NEUTROABS 1.3 (L) 01/17/2020    ASSESSMENT & PLAN:  Malignant neoplasm of upper-inner quadrant of right breast in female, estrogen receptor positive (HCC) 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%, PR 0%, HER-2 negative, Ki-67 20%; biopsy of the 4.5 cm mass came back as IDC grade 2-3, ER 50%, PR 5%, HER-2 -2+ ratio 1.6, copy #3.2, Ki-67 20%, T2N1 stage IIb   Recommendation: 1.  Neoadjuvant chemotherapy with dose dense Adriamycin and Cytoxan x4 followed by Taxol weekly x11 05/09/2018-10/02/2018 2.  11/20/2018: Right mastectomy and axillary lymph node dissection 3.  Since the final path was HER-2 positive she will get 1 year of Herceptin completed 01/17/20 4.  Adjuvant radiation 01/01/2019-02/14/2019 5.  Followed by adjuvant antiestrogen therapy patient prefers tamoxifen starting  03/28/2019 ----------------------------------------------------------- 11/20/2018:Right mastectomy: Residual grade 2 invasive ductal carcinoma multifocal, largest 1.8 cm, intermediate grade DCIS, margins negative, lymphovascular space invasion present, 0/6 lymph nodes negative, ER 60%, PR 60%, HER-2 positive ratio 2.59   Current treatment: Tamoxifen 20 mg daily 03/28/2019    Tamoxifen toxicities:  Hot flashes: Moderate  Menstrual cycles have resumed and the seem to be heavier.  Pelvic ultrasound 11/13/2022: Endometrial polyp   Breast cancer surveillance:  Left mammogram 07/18/2022: Benign breast density category B, prior right mastectomy Breast Exam: 07/06/2023: Benign, right chest wall scar is without any lumps or nodules.   Mammogram scheduled for 07/20/2023  Return to clinic in 1 year for follow-up ------------------------------------- Assessment and Plan    Breast Cancer Survivorship 5 years post-diagnosis, currently on Tamoxifen. No signs of recurrence. -Continue Tamoxifen as prescribed. -Initiate GARDENT blood test every 6 months for early detection of recurrence.  Abnormal Uterine Bleeding Reports of intermittent heavy bleeding. History of uterine polyps detected on ultrasound. Patient is on Tamoxifen which can cause uterine changes. -Encourage patient to discuss bleeding with gynecologist. -Consider endometrial biopsy or polyp removal as per gynecologist's assessment.  Contraception Patient is on Tamoxifen and should avoid pregnancy due to risk of birth defects. Discussed safe options for contraception. -Consider Copper IUD or Mirena IUD. Avoid hormonal contraceptives due to hormone-driven cancer history.  Muscle Cramps Reports of muscle cramps, a known side effect of Tamoxifen. -Consider home remedies such as pickle juice or mustard. Alternatively, consider over-the-counter magnesium supplements or tonic water.  Follow-up Annual check-in with oncologist. Regular follow-up with  gynecologist recommended due to abnormal uterine bleeding.          No orders of the defined types were placed in this encounter.  The patient has a good understanding of the overall plan. she agrees with it. she will call with any problems that may develop before the next visit here. Total time spent: 30 mins including face to face time and time spent for planning, charting and co-ordination of care   Tamsen Meek, MD 07/06/23

## 2023-07-07 ENCOUNTER — Telehealth: Payer: Self-pay

## 2023-07-07 NOTE — Telephone Encounter (Signed)
 Per md orders entered for [facility] and all supported documents faxed to [phone]. Faxed confirmation was received.

## 2023-07-21 ENCOUNTER — Ambulatory Visit
Admission: RE | Admit: 2023-07-21 | Discharge: 2023-07-21 | Disposition: A | Discharge status: Home / Self Care | Payer: BC Managed Care – PPO | Source: Ambulatory Visit | Attending: Hematology and Oncology | Admitting: Hematology and Oncology

## 2023-07-21 DIAGNOSIS — Z1231 Encounter for screening mammogram for malignant neoplasm of breast: Secondary | ICD-10-CM

## 2023-08-01 ENCOUNTER — Telehealth: Payer: Self-pay

## 2023-08-01 NOTE — Telephone Encounter (Signed)
[facility] reached out to pt. To set up mobile draw. no response to mobile phlebotomy team and they were unable to schedule draw.

## 2023-08-08 ENCOUNTER — Telehealth: Payer: Self-pay | Admitting: *Deleted

## 2023-08-08 NOTE — Telephone Encounter (Signed)
Per MD request RN placed call to pt with recent [facility] results being negative. No answer, unable to LVM due to no VM option.

## 2023-08-09 ENCOUNTER — Encounter: Payer: Self-pay | Admitting: Hematology and Oncology

## 2023-08-18 ENCOUNTER — Other Ambulatory Visit: Payer: Self-pay | Admitting: Nurse Practitioner

## 2023-08-18 DIAGNOSIS — E049 Nontoxic goiter, unspecified: Secondary | ICD-10-CM

## 2023-08-22 ENCOUNTER — Other Ambulatory Visit

## 2023-08-22 ENCOUNTER — Ambulatory Visit
Admission: RE | Admit: 2023-08-22 | Discharge: 2023-08-22 | Disposition: A | Discharge status: Home / Self Care | Source: Ambulatory Visit | Attending: Nurse Practitioner | Admitting: Nurse Practitioner

## 2023-08-22 DIAGNOSIS — E049 Nontoxic goiter, unspecified: Secondary | ICD-10-CM

## 2023-11-14 ENCOUNTER — Inpatient Hospital Stay: Attending: Adult Health | Admitting: Adult Health

## 2023-11-14 ENCOUNTER — Encounter: Payer: Self-pay | Admitting: *Deleted

## 2023-11-14 ENCOUNTER — Encounter: Payer: Self-pay | Admitting: Hematology and Oncology

## 2023-11-14 VITALS — BP 143/95 | HR 99 | Temp 98.1°F | Resp 18 | Ht 61.0 in | Wt 180.1 lb

## 2023-11-14 DIAGNOSIS — C50211 Malignant neoplasm of upper-inner quadrant of right female breast: Secondary | ICD-10-CM | POA: Insufficient documentation

## 2023-11-14 DIAGNOSIS — Z1722 Progesterone receptor negative status: Secondary | ICD-10-CM | POA: Diagnosis not present

## 2023-11-14 DIAGNOSIS — Z923 Personal history of irradiation: Secondary | ICD-10-CM | POA: Diagnosis not present

## 2023-11-14 DIAGNOSIS — Z79810 Long term (current) use of selective estrogen receptor modulators (SERMs): Secondary | ICD-10-CM | POA: Insufficient documentation

## 2023-11-14 DIAGNOSIS — Z170 Estrogen receptor positive status [ER+]: Secondary | ICD-10-CM | POA: Insufficient documentation

## 2023-11-14 DIAGNOSIS — Z1732 Human epidermal growth factor receptor 2 negative status: Secondary | ICD-10-CM | POA: Diagnosis not present

## 2023-11-14 DIAGNOSIS — Z9221 Personal history of antineoplastic chemotherapy: Secondary | ICD-10-CM | POA: Diagnosis not present

## 2023-11-14 DIAGNOSIS — Z800 Family history of malignant neoplasm of digestive organs: Secondary | ICD-10-CM | POA: Insufficient documentation

## 2023-11-14 DIAGNOSIS — Z801 Family history of malignant neoplasm of trachea, bronchus and lung: Secondary | ICD-10-CM | POA: Insufficient documentation

## 2023-11-14 DIAGNOSIS — Z9011 Acquired absence of right breast and nipple: Secondary | ICD-10-CM | POA: Diagnosis not present

## 2023-11-14 DIAGNOSIS — M7989 Other specified soft tissue disorders: Secondary | ICD-10-CM | POA: Diagnosis not present

## 2023-11-14 DIAGNOSIS — R221 Localized swelling, mass and lump, neck: Secondary | ICD-10-CM

## 2023-11-14 NOTE — Assessment & Plan Note (Signed)
 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  Neoadjuvant chemotherapy with dose dense Adriamycin  and Cytoxan  x4 followed by Taxol  weekly x11 05/09/2018-10/02/2018 2.  11/20/2018: Right mastectomy and axillary lymph node dissection 3.  Since the final path was HER-2 positive she will get 1 year of Herceptin  completed 01/17/20 4.  Adjuvant radiation 01/01/2019-02/14/2019 5.  Followed by adjuvant antiestrogen therapy patient prefers tamoxifen  starting 03/28/2019 ----------------------------------------------------------- 11/20/2018:Right mastectomy: Residual grade 2 invasive ductal carcinoma multifocal, largest 1.8 cm, intermediate grade DCIS, margins negative, lymphovascular space invasion present, 0/6 lymph nodes negative, ER 60%, PR 60%, HER-2 positive ratio 2.59   Current treatment: Tamoxifen  20 mg daily 03/28/2019    Assessment and Plan Assessment & Plan Right arm swelling Acute swelling with tenderness, Doppler negative for thrombosis. Possible vascular issues undetected by Doppler. - Order CT scan of the chest with contrast to evaluate and r/o SVC syndrome. - Coordinate with insurance for CT scan approval. - Schedule CT scan for today or tomorrow. - Review CT scan results within 24 hours.  Right-sided breast cancer Stage IIB diagnosed in 2019, post-treatment with neoadjuvant chemotherapy, Herceptin , mastectomy, radiation, and on tamoxifen . No new changes or pain. - Hold Tamoxifen  until work up is completed.   RTC based on above results.

## 2023-11-14 NOTE — Progress Notes (Signed)
 [facility] Cancer Follow up:    Steven Elam, DO [address] [place] [address]   DIAGNOSIS:  Cancer Staging  Malignant neoplasm of upper-inner quadrant of right breast in female, estrogen receptor positive (HCC) Staging form: Breast, AJCC 8th Edition - Clinical: Stage IIIA (cT2, cN1, cM0, G3, ER+, PR-, HER2-) - Unsigned Method of lymph node assessment: Clinical Histologic grading system: 3 grade system Laterality: Right Tumor size (mm): 160 - Pathologic stage from 11/20/2018: Stage IA (pT1c, pN0, cM0, G2, ER+, PR+, HER2+) - Signed by Percival Brace, NP on 12/05/2018 Histologic grading system: 3 grade system    SUMMARY OF ONCOLOGIC HISTORY: Oncology History  Malignant neoplasm of upper-inner quadrant of right breast in female, estrogen receptor positive (HCC)  04/17/2018 Initial Diagnosis   Palpable right breast masses UOQ and UIQ with multiple masses 12 to 14 cm, at 1:00 1.6 cm, 1231.8 cm, and o'clock 4.5 cm and at 9:30 position 5 cm: Biopsy revealed IDC with DCIS, grade 3, ER 80%, PR 0%, HER-2 negative, Ki-67 20%; biopsy of the 4.5 cm mass came back as IDC grade 2-3, ER 50%, PR 5%, HER-2 -2+ ratio 1.6, copy #3.2, Ki-67 20%, T2N1 stage IIb   05/11/2018 - 01/17/2020 Chemotherapy   dexamethasone  (DECADRON ) 4 MG tablet, 4 mg (100 % of original dose 4 mg), Oral, Daily, 1 of 1 cycle, Start date: 04/25/2018, End date: 07/10/2018. Dose modification: 4 mg (original dose 4 mg, Cycle 0)  DOXOrubicin  (ADRIAMYCIN ) chemo injection 104 mg, 60 mg/m2 = 104 mg, Intravenous,  Once, 4 of 4 cycles. Administration: 104 mg (05/11/2018), 104 mg (05/23/2018), 104 mg (06/06/2018), 104 mg (06/22/2018)  palonosetron  (ALOXI ) injection 0.25 mg, 0.25 mg, Intravenous,  Once, 4 of 4 cycles. Administration: 0.25 mg (05/11/2018), 0.25 mg (05/23/2018), 0.25 mg (06/06/2018), 0.25 mg (06/22/2018)  pegfilgrastim -cbqv (UDENYCA ) injection 6 mg, 6 mg, Subcutaneous, Once, 4 of 4  cycles. Administration: 6 mg (05/12/2018), 6 mg (05/24/2018), 6 mg (06/07/2018), 6 mg (06/23/2018)  cyclophosphamide  (CYTOXAN ) 1,040 mg in sodium chloride  0.9 % 250 mL chemo infusion, 600 mg/m2 = 1,040 mg, Intravenous,  Once, 4 of 4 cycles. Administration: 1,040 mg (05/11/2018), 1,040 mg (05/23/2018), 1,040 mg (06/06/2018), 1,040 mg (06/22/2018)  PACLitaxel  (TAXOL ) 138 mg in sodium chloride  0.9 % 250 mL chemo infusion (</= 80mg /m2), 80 mg/m2 = 138 mg, Intravenous,  Once, 11 of 12 cycles. Administration: 138 mg (07/10/2018), 138 mg (07/17/2018), 138 mg (07/24/2018), 138 mg (08/14/2018), 138 mg (08/22/2018), 138 mg (08/28/2018), 138 mg (09/04/2018), 138 mg (09/11/2018), 138 mg (09/18/2018), 138 mg (09/25/2018), 138 mg (10/02/2018)  fosaprepitant  (EMEND ) 150 mg  dexamethasone  (DECADRON ) 12 mg in sodium chloride  0.9 % 145 mL IVPB, , Intravenous,  Once, 4 of 4 cycles. Administration:  (05/11/2018),  (05/23/2018),  (06/06/2018),  (06/22/2018)  trastuzumab  (HERCEPTIN ) 546 mg in sodium chloride  0.9 % 250 mL chemo infusion, 8 mg/kg = 546 mg, Intravenous,  Once, 3 of 3 cycles. Administration: 546 mg (12/14/2018), 399 mg (01/03/2019), 399 mg (01/24/2019)  trastuzumab -dkst (OGIVRI ) 399 mg in sodium chloride  0.9 % 250 mL chemo infusion, 6 mg/kg = 399 mg (100 % of original dose 6 mg/kg), Intravenous,  Once, 14 of 14 cycles. Dose modification: 6 mg/kg (original dose 6 mg/kg, Cycle 4, Reason: Other (see comments), Comment: Biosimilar Conversion), 6 mg/kg (original dose 6 mg/kg, Cycle 15, Reason: Other (see comments), Comment: weight increase). Administration: 399 mg (02/14/2019), 399 mg (03/07/2019), 399 mg (03/28/2019), 399 mg (04/18/2019), 399 mg (05/09/2019), 399 mg (05/30/2019), 399  mg (06/20/2019), 399 mg (07/11/2019), 399 mg (08/01/2019), 399 mg (10/18/2019), 399 mg (11/07/2019), 450 mg (12/27/2019), 450 mg (01/17/2020), 450 mg (12/06/2019)   06/19/2018 Genetic Testing   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   The Multi-Gene Panel offered by [facility] includes sequencing and/or deletion duplication testing of the following 84 genes: AIP, ALK, APC, ATM, AXIN2,BAP1,  BARD1, BLM, BMPR1A, BRCA1, BRCA2, BRIP1, CASR, CDC73, CDH1, CDK4, CDKN1B, CDKN1C, CDKN2A (p14ARF), CDKN2A (p16INK4a), CEBPA, CHEK2, CTNNA1, DICER1, DIS3L2, EGFR (c.2369C>T, p.Thr790Met variant only), EPCAM (Deletion/duplication testing only), FH, FLCN, GATA2, GPC3, GREM1 (Promoter region deletion/duplication testing only), HOXB13 (c.251G>A, p.Gly84Glu), HRAS, KIT, MAX, MEN1, MET, MITF (c.952G>A, p.Glu318Lys variant only), MLH1, MSH2, MSH3, MSH6, MUTYH, NBN, NF1, NF2, NTHL1, PALB2, PDGFRA, PHOX2B, PMS2, POLD1, POLE, POT1, PRKAR1A, PTCH1, PTEN, RAD50, RAD51C, RAD51D, RB1, RECQL4, RET, RUNX1, SDHAF2, SDHA (sequence changes only), SDHB, SDHC, SDHD, SMAD4, SMARCA4, SMARCB1, SMARCE1, STK11, SUFU, TERC, TERT, TMEM127, TP53, TSC1, TSC2, VHL, WRN and WT1.  The report date is 06/19/2018.   10/11/2018 Breast MRI   Significant decrease in the size of the right axillary adenopathy and right breast masses from 1.2cm to 0.9cm with minimal residual enhancement, and 2.4x1.4cm to 2.3x1.9cm.   11/20/2018 Surgery   Right mastectomy ([facility]) ([id number]): Residual grade 2 invasive ductal carcinoma multifocal, largest 1.8 cm, intermediate grade DCIS, margins negative, lymphovascular space invasion present, 6 lymph nodes negative, ER 60%, PR 60%, HER-2 positive ratio 2.59   11/20/2018 Cancer Staging   Staging form: Breast, AJCC 8th Edition - Pathologic stage from 11/20/2018: Stage IA (pT1c, pN0, cM0, G2, ER+, PR+, HER2+)   01/01/2019 - 02/14/2019 Radiation Therapy   The patient initially received a dose of 50.4 Gy in 28 fractions to the chest wall and supraclavicular region. This was delivered using a 3-D conformal, 4 field technique. The patient then received a boost to the mastectomy scar. This delivered an additional 10 Gy in 5 fractions using an en face electron field. The  total dose was 60.4 Gy.   03/2019 -  Anti-estrogen oral therapy   Tamoxifen  daily     CURRENT THERAPY: tamoxifen   INTERVAL HISTORY:  Discussed the use of AI scribe software for clinical note transcription with the patient, who gave verbal consent to proceed.  History of Present Illness Miranda Ayers is a 38 year old female with right-sided breast cancer who presents with right arm swelling.  She was diagnosed with stage 2B right-sided breast cancer in 2019 and underwent neoadjuvant chemotherapy, maintenance Herceptin , right mastectomy, adjuvant radiation, and continues on adjuvant tamoxifen .  Right arm swelling began on Sunday, with sensitivity to touch, especially near the previous chemotherapy port site and right forearm. There is no history of lymphedema in this arm, and she avoids blood draws or needle sticks in it. A Doppler ultrasound was completed today at [facility] and was negative for blood clots. She denies neck swelling, right axillary adenopathy or nodules at her right mastectomy site, though she occasionally experiences sharp pain and numbness.  She has no new shortness of breath, headaches, or continuous arm pain. Pain occurs only when the arm is touched or bumped.     Patient Active Problem List   Diagnosis Date Noted   Neutropenia, drug-induced (HCC) 08/20/2018   Genetic testing 06/22/2018   Family history of lung cancer    Family history of colon cancer    Family history of pancreatic cancer    Malignant neoplasm of upper-inner quadrant of  right breast in female, estrogen receptor positive (HCC) 04/19/2018    has no known allergies.  MEDICAL HISTORY: Past Medical History:  Diagnosis Date   BRCA negative 10/2022   MyRisk neg   Breast cancer (HCC)    Cancer (HCC)    breast cancer   CHF (congestive heart failure) (HCC)    Family history of colon cancer    Family history of lung cancer    Family history of pancreatic cancer    Hypertension      SURGICAL HISTORY: Past Surgical History:  Procedure Laterality Date   MASTECTOMY     MASTECTOMY WITH RADIOACTIVE SEED GUIDED EXCISION AND AXILLARY SENTINEL LYMPH NODE BIOPSY Right 11/20/2018   Procedure: RIGHT SIMPLE MASTECTOMY WITH RIGHT RADIOACTIVE SEED TARGETED LYMPH NODE BIOPSY AND RIGHT  AXILLARY SENTINEL LYMPH NODE MAPPING AND PORT REMOVEL RIGHT CHEST;  Surgeon: Sim Dryer, MD;  Location: [facility];  Service: General;  Laterality: Right;   OOPHORECTOMY  2013   PORTACATH PLACEMENT N/A 05/08/2018   Procedure: INSERTION PORT-A-CATH WITH ULTRASOUND;  Surgeon: Sim Dryer, MD;  Location: [facility];  Service: General;  Laterality: N/A;    SOCIAL HISTORY: Social History   Socioeconomic History   Marital status: Single    Spouse name: Not on file   Number of children: Not on file   Years of education: Not on file   Highest education level: Not on file  Occupational History   Not on file  Tobacco Use   Smoking status: Never   Smokeless tobacco: Never  Vaping Use   Vaping status: Never Used  Substance and Sexual Activity   Alcohol use: Never   Drug use: Never   Sexual activity: Not Currently    Birth control/protection: None  Other Topics Concern   Not on file  Social History Narrative   Not on file   Social Drivers of Health   Financial Resource Strain: Not on file  Food Insecurity: Not on file  Transportation Needs: Not on file  Physical Activity: Not on file  Stress: Not on file  Social Connections: Not on file  Intimate Partner Violence: Not on file    FAMILY HISTORY: Family History  Problem Relation Age of Onset   Lung cancer Maternal Grandmother    Lung cancer Maternal Grandfather    Colon cancer Maternal Grandfather    Stomach cancer Paternal Grandfather    Lung cancer Maternal Aunt    Pancreatic cancer Maternal Aunt    Lung cancer Maternal Uncle    Cancer Cousin        childhood cancer in mat first cousin     Review of Systems  Constitutional:  Negative for appetite change, chills, fatigue, fever and unexpected weight change.  HENT:   Negative for hearing loss, lump/mass and trouble swallowing.   Eyes:  Negative for eye problems and icterus.  Respiratory:  Negative for chest tightness, cough and shortness of breath.   Cardiovascular:  Negative for chest pain, leg swelling and palpitations.  Gastrointestinal:  Negative for abdominal distention, abdominal pain, constipation, diarrhea, nausea and vomiting.  Endocrine: Negative for hot flashes.  Genitourinary:  Negative for difficulty urinating.   Musculoskeletal:  Negative for arthralgias.  Skin:  Negative for itching and rash.  Neurological:  Negative for dizziness, extremity weakness, headaches and numbness.  Hematological:  Negative for adenopathy. Does not bruise/bleed easily.  Psychiatric/Behavioral:  Negative for depression. The patient is not nervous/anxious.       PHYSICAL EXAMINATION  Vitals:   11/14/23 1301  BP: (!) 143/95  Pulse: 99  Resp: 18  Temp: 98.1 F (36.7 C)  SpO2: 100%    Physical Exam Constitutional:      General: She is not in acute distress.    Appearance: Normal appearance. She is not toxic-appearing.  HENT:     Head: Normocephalic and atraumatic.     Mouth/Throat:     Mouth: Mucous membranes are moist.     Pharynx: Oropharynx is clear. No oropharyngeal exudate or posterior oropharyngeal erythema.  Eyes:     General: No scleral icterus. Cardiovascular:     Rate and Rhythm: Normal rate and regular rhythm.     Pulses: Normal pulses.     Heart sounds: Normal heart sounds.  Pulmonary:     Effort: Pulmonary effort is normal.     Breath sounds: Normal breath sounds.  Chest:     Comments: Right breast s/p mastectomy, no sign of local recurrence noted Abdominal:     General: Abdomen is flat. Bowel sounds are normal. There is no distension.     Palpations: Abdomen is soft.     Tenderness: There is  no abdominal tenderness.  Musculoskeletal:        General: Swelling (swelling noted in right arm, and forearm, slight swelling in right supraclavicular area) present.     Cervical back: Neck supple.  Lymphadenopathy:     Cervical: No cervical adenopathy.     Upper Body:     Right upper body: No supraclavicular or axillary adenopathy.     Left upper body: No supraclavicular or axillary adenopathy.  Skin:    General: Skin is warm and dry.     Findings: No rash.  Neurological:     General: No focal deficit present.     Mental Status: She is alert.  Psychiatric:        Mood and Affect: Mood normal.        Behavior: Behavior normal.     LABORATORY DATA:  CBC    Component Value Date/Time   WBC 3.1 (L) 01/17/2020 0917   WBC 2.7 (L) 11/16/2018 1507   RBC 4.51 01/17/2020 0917   HGB 13.0 01/17/2020 0917   HCT 40.4 01/17/2020 0917   PLT 159 01/17/2020 0917   MCV 89.6 01/17/2020 0917   MCH 28.8 01/17/2020 0917   MCHC 32.2 01/17/2020 0917   RDW 13.2 01/17/2020 0917   LYMPHSABS 1.5 01/17/2020 0917   MONOABS 0.3 01/17/2020 0917   EOSABS 0.1 01/17/2020 0917   BASOSABS 0.0 01/17/2020 0917    CMP     Component Value Date/Time   NA 140 01/17/2020 0917   K 4.0 01/17/2020 0917   CL 109 01/17/2020 0917   CO2 22 01/17/2020 0917   GLUCOSE 86 01/17/2020 0917   BUN 11 01/17/2020 0917   CREATININE 0.78 01/17/2020 0917   CALCIUM 9.6 01/17/2020 0917   PROT 7.3 01/17/2020 0917   ALBUMIN 3.7 01/17/2020 0917   AST 14 (L) 01/17/2020 0917   ALT 12 01/17/2020 0917   ALKPHOS 48 01/17/2020 0917   BILITOT 0.3 01/17/2020 0917   GFRNONAA >60 01/17/2020 0917   GFRAA >60 01/17/2020 0917     ASSESSMENT and THERAPY PLAN:   Malignant neoplasm of upper-inner quadrant of right breast in female, estrogen receptor positive (HCC) 04/17/2018:Palpable right breast masses UOQ and UIQ with multiple masses 12 to 14 cm, at 1:00 1.6 cm, 1231.8 cm, and o'clock 4.5 cm and at 9:30 position 5 cm:  Biopsy  revealed IDC with DCIS, grade 3, ER 80%, PR 0%, HER-2 negative, Ki-67 20%; biopsy of the 4.5 cm mass came back as IDC grade 2-3, ER 50%, PR 5%, HER-2 -2+ ratio 1.6, copy #3.2, Ki-67 20%, T2N1 stage IIb   Recommendation: 1.  Neoadjuvant chemotherapy with dose dense Adriamycin  and Cytoxan  x4 followed by Taxol  weekly x11 05/09/2018-10/02/2018 2.  11/20/2018: Right mastectomy and axillary lymph node dissection 3.  Since the final path was HER-2 positive she will get 1 year of Herceptin  completed 01/17/20 4.  Adjuvant radiation 01/01/2019-02/14/2019 5.  Followed by adjuvant antiestrogen therapy patient prefers tamoxifen  starting 03/28/2019 ----------------------------------------------------------- 11/20/2018:Right mastectomy: Residual grade 2 invasive ductal carcinoma multifocal, largest 1.8 cm, intermediate grade DCIS, margins negative, lymphovascular space invasion present, 0/6 lymph nodes negative, ER 60%, PR 60%, HER-2 positive ratio 2.59   Current treatment: Tamoxifen  20 mg daily 03/28/2019    Assessment and Plan Assessment & Plan Right arm swelling Acute swelling with tenderness, Doppler negative for thrombosis. Possible vascular issues undetected by Doppler. - Order CT scan of the chest with contrast to evaluate and r/o SVC syndrome. - Coordinate with insurance for CT scan approval. - Schedule CT scan for today or tomorrow. - Review CT scan results within 24 hours.  Right-sided breast cancer Stage IIB diagnosed in 2019, post-treatment with neoadjuvant chemotherapy, Herceptin , mastectomy, radiation, and on tamoxifen . No new changes or pain. - Hold Tamoxifen  until work up is completed.   RTC based on above results.        All questions were answered. The patient knows to call the clinic with any problems, questions or concerns. We can certainly see the patient much sooner if necessary.  Total encounter time:30 minutes*in face-to-face visit time, chart review, lab review, care coordination,  order entry, and documentation of the encounter time.    Alwin Baars, NP 11/14/23 1:37 PM Medical Oncology and Hematology [facility] [address] [place], [place] [address] Tel. [phone]    Fax. [phone]  *Total Encounter Time as defined by the Centers for Medicare and Medicaid Services includes, in addition to the face-to-face time of a patient visit (documented in the note above) non-face-to-face time: obtaining and reviewing outside history, ordering and reviewing medications, tests or procedures, care coordination (communications with other health care professionals or caregivers) and documentation in the medical record.

## 2023-11-15 ENCOUNTER — Ambulatory Visit (HOSPITAL_COMMUNITY)
Admission: RE | Admit: 2023-11-15 | Discharge: 2023-11-15 | Disposition: A | Discharge status: Home / Self Care | Source: Ambulatory Visit | Attending: Adult Health | Admitting: Adult Health

## 2023-11-15 DIAGNOSIS — C50211 Malignant neoplasm of upper-inner quadrant of right female breast: Secondary | ICD-10-CM | POA: Diagnosis present

## 2023-11-15 DIAGNOSIS — Z170 Estrogen receptor positive status [ER+]: Secondary | ICD-10-CM | POA: Diagnosis present

## 2023-11-15 DIAGNOSIS — M7989 Other specified soft tissue disorders: Secondary | ICD-10-CM | POA: Diagnosis present

## 2023-11-15 DIAGNOSIS — R221 Localized swelling, mass and lump, neck: Secondary | ICD-10-CM | POA: Insufficient documentation

## 2023-11-15 MED ORDER — IOHEXOL 300 MG/ML  SOLN
75.0000 mL | Freq: Once | INTRAMUSCULAR | Status: AC | PRN
  Administered 2023-11-15: 75 mL via INTRAVENOUS

## 2023-11-17 ENCOUNTER — Ambulatory Visit: Payer: Self-pay

## 2023-11-17 ENCOUNTER — Other Ambulatory Visit: Payer: Self-pay

## 2023-11-17 ENCOUNTER — Other Ambulatory Visit: Payer: Self-pay | Admitting: Adult Health

## 2023-11-17 DIAGNOSIS — R221 Localized swelling, mass and lump, neck: Secondary | ICD-10-CM

## 2023-11-17 DIAGNOSIS — Z170 Estrogen receptor positive status [ER+]: Secondary | ICD-10-CM

## 2023-11-17 DIAGNOSIS — M7989 Other specified soft tissue disorders: Secondary | ICD-10-CM

## 2023-11-17 DIAGNOSIS — C50211 Malignant neoplasm of upper-inner quadrant of right female breast: Secondary | ICD-10-CM

## 2023-11-17 DIAGNOSIS — K769 Liver disease, unspecified: Secondary | ICD-10-CM

## 2023-11-17 NOTE — Telephone Encounter (Signed)
-----   Message from Conway Dennis sent at 11/17/2023  8:42 AM EDT ----- Please let patient know that ct chest is negative for a cause of her arm swelling.  Please refer to PT if she is agreeable to go.  Also, recommend MRI of the liver to evaluate the nonspecific areas noted on scan.  If agreeable let me know and I will order. ----- Message ----- From: Interface, Rad Results In Sent: 11/15/2023   5:35 PM EDT To: Percival Brace, NP

## 2023-11-17 NOTE — Telephone Encounter (Signed)
 Called pt to discuss per NP. She is agreeable to PT and MRI. Message sent to provider to order and will assist pt in scheduling.  Pt verbalized thanks and understanding.

## 2023-11-21 ENCOUNTER — Other Ambulatory Visit: Payer: Self-pay

## 2023-11-21 ENCOUNTER — Ambulatory Visit: Attending: Adult Health | Admitting: Physical Therapy

## 2023-11-21 ENCOUNTER — Encounter: Payer: Self-pay | Admitting: Physical Therapy

## 2023-11-21 DIAGNOSIS — Z853 Personal history of malignant neoplasm of breast: Secondary | ICD-10-CM | POA: Insufficient documentation

## 2023-11-21 DIAGNOSIS — I972 Postmastectomy lymphedema syndrome: Secondary | ICD-10-CM | POA: Diagnosis present

## 2023-11-21 DIAGNOSIS — Z170 Estrogen receptor positive status [ER+]: Secondary | ICD-10-CM

## 2023-11-21 NOTE — Therapy (Signed)
 OUTPATIENT PHYSICAL THERAPY  UPPER EXTREMITY ONCOLOGY EVALUATION  Patient Name: Miranda Ayers MRN: [id number] DOB:27-Feb-1986, 38 y.o., female Today's Date: 11/21/2023  END OF SESSION:  PT End of Session - 11/21/23 0925     Visit Number 1    Number of Visits 9    Date for PT Re-Evaluation 12/19/23    PT Start Time 0920   pt arrived late   PT Stop Time 0958    PT Time Calculation (min) 38 min    Activity Tolerance Patient tolerated treatment well    Behavior During Therapy [facility] for tasks assessed/performed             Past Medical History:  Diagnosis Date   BRCA negative 10/2022   MyRisk neg   Breast cancer (HCC)    Cancer (HCC)    breast cancer   CHF (congestive heart failure) (HCC)    Family history of colon cancer    Family history of lung cancer    Family history of pancreatic cancer    Hypertension    Past Surgical History:  Procedure Laterality Date   MASTECTOMY     MASTECTOMY WITH RADIOACTIVE SEED GUIDED EXCISION AND AXILLARY SENTINEL LYMPH NODE BIOPSY Right 11/20/2018   Procedure: RIGHT SIMPLE MASTECTOMY WITH RIGHT RADIOACTIVE SEED TARGETED LYMPH NODE BIOPSY AND RIGHT  AXILLARY SENTINEL LYMPH NODE MAPPING AND PORT REMOVEL RIGHT CHEST;  Surgeon: Sim Dryer, MD;  Location: [facility];  Service: General;  Laterality: Right;   OOPHORECTOMY  2013   PORTACATH PLACEMENT N/A 05/08/2018   Procedure: INSERTION PORT-A-CATH WITH ULTRASOUND;  Surgeon: Sim Dryer, MD;  Location:  SUR[facility]ERY C[facility];  Service: General;  Laterality: N/A;   Patient Active Problem List   Diagnosis Date Noted   Neutropenia, drug-induced (HCC) 08/20/2018   Genetic testing 06/22/2018   Family history of lung cancer    Family history of colon cancer    Family history of pancreatic cancer    Malignant neoplasm of upper-inner quadrant of right breast in female, estrogen receptor positive (HCC) 04/19/2018    PCP: Alphonsa Asai, DO  REFERRING PROVIDER: Percival Brace, NP  REFERRING DIAG: M79.89 (ICD-10-CM) - Arm swelling C50.211,Z17.0 (ICD-10-CM) - Malignant neoplasm of upper-inner quadrant of right breast in female, estrogen receptor positive (HCC) R22.1 (ICD-10-CM) - Neck swelling  THERAPY DIAG:  Postmastectomy lymphedema  Malignant neoplasm of upper-inner quadrant of right breast in female, estrogen receptor positive (HCC)  ONSET DATE: 11/11/23  Rationale for Evaluation and Treatment: Rehabilitation  SUBJECTIVE:  SUBJECTIVE STATEMENT: My hand started to feel really tender. I went to work on Saturday and when I came back home I had a little bit of swelling. Sunday my hand was swelling and I went to the ER. They did an ultrasound  PERTINENT HISTORY: 11/20/18- right mastectomy for treatment of right breast cancer as well as SLNB (2 of 4 nodes positive), pt completed chemo and radiation. Had negative doppler and CT did not show any issues.   PAIN:  Are you having pain? Yes NPRS scale: 1/10 Pain location: R hand Pain orientation: Right  PAIN TYPE: tender Pain description: intermittent  Aggravating factors: touching it Relieving factors: not touching it  PRECAUTIONS: Other: R UE lymphedema  RED FLAGS: None   WEIGHT BEARING RESTRICTIONS: No  FALLS:  Has patient fallen in last 6 months? 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  COGNITION: Overall cognitive status: Within functional limits for tasks assessed   PALPATION: Fullness palpable at dorsal surface of hand  OBSERVATIONS / OTHER ASSESSMENTS: R hand, wrist and forearm visibly swollen compared to L side  POSTURE: forward head, rounded shoulders  UPPER EXTREMITY AROM/PROM: WFL   LYMPHEDEMA ASSESSMENTS:   LANDMARK RIGHT  eval  At axilla  33.5  15 cm proximal to olecranon process 32.7  10 cm proximal to olecranon process 30  Olecranon process 26  15 cm proximal to ulnar styloid process 26.5  10 cm proximal to ulnar styloid process 22.2  Just proximal to ulnar styloid process 17.4  Across hand at thumb web space 20  At base of 2nd digit 6.2  (Blank rows = not tested)  LANDMARK LEFT  eval  At axilla  34  15 cm proximal to olecranon process 34  10 cm proximal to olecranon process 30.2  Olecranon process 25.4  15 cm proximal to ulnar styloid process 24.8  10 cm proximal to ulnar styloid process 21.2  Just proximal to ulnar styloid process 16  Across hand at thumb web space 18.2  At base of 2nd digit 5.8  (Blank rows = not tested)                                                                                                                             TREATMENT DATE:  11/21/23: Measured pt for compression sleeve and glove today and sent info for pt to obtain through [employer]. Pt fit in to M2 Sigvaris secure sleeve and small glove in black.     PATIENT EDUCATION:  Education details:  need for compression bandaging and MLD and how  those are used to manage swelling, anatomy and physiology of the lymphatic system, need for compression garments especially at work Person educated: Patient Education method: Explanation Education comprehension: verbalized understanding  HOME EXERCISE PROGRAM: None yet  ASSESSMENT:  CLINICAL IMPRESSION: Patient is a 38 y.o. female who was seen today for physical therapy evaluation and treatment for R UE  lymphedema. 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 She has been doing this for years but started developing swelling last weekend in her hand. Her doppler and CT were negative. Pt had a R mastectomy and lymph node dissection (2 of 4 nodes positive) on 11/20/18. She has completed chemo and radiation. Educated pt about lymphedema and need for MLD and compression for management. She would benefit from skilled PT services to learn how to perform self MLD, how to self bandage and to obtain appropriate compression garments for long term management.     OBJECTIVE IMPAIRMENTS: decreased knowledge of condition, decreased knowledge of use of DME, increased edema, and pain.   ACTIVITY LIMITATIONS: carrying and lifting  PARTICIPATION LIMITATIONS: occupation  PERSONAL FACTORS: none are also affecting patient's functional outcome.   REHAB POTENTIAL: Good  CLINICAL DECISION MAKING: Stable/uncomplicated  EVALUATION COMPLEXITY: Low  GOALS: Goals reviewed with patient? Yes  SHORT TERM GOALS=LONG TERM GOALS Target date: 12/19/23  Pt will be able to independently bandage her R UE for long term management of lymphedema.  Baseline: Goal status: INITIAL  2.  Pt will be independent in self MLD for long term management of lymphedema.  Baseline:  Goal status: INITIAL  3.  Pt will obtain compression garments for long term management of lymphedema.  Baseline:  Goal status: INITIAL  4. Pt will be able to verbalize lymphedema risk reduction practices   Goal status: INITIAL    PLAN:  PT FREQUENCY: 2x/week  PT DURATION: 4 weeks  PLANNED INTERVENTIONS: 97164- PT Re-evaluation, 97110-Therapeutic exercises, 97530- Therapeutic activity, 97535- Self Care, 82956- Manual therapy, [phone]- Orthotic Initial, [phone]- Orthotic/Prosthetic subsequent, Patient/Family education, Balance training, Joint mobilization, Therapeutic exercises, Therapeutic activity, Neuromuscular re-education, Gait training, and Self  Care  PLAN FOR NEXT SESSION: instruct in self bandaging and self MLD, assess garments for fit once they arrive (sent link to obtain through [employer])  [employer], PT 11/21/2023, 11:02 AM

## 2023-11-22 ENCOUNTER — Ambulatory Visit (HOSPITAL_COMMUNITY)
Admission: RE | Admit: 2023-11-22 | Discharge: 2023-11-22 | Disposition: A | Discharge status: Home / Self Care | Source: Ambulatory Visit | Attending: Adult Health | Admitting: Adult Health

## 2023-11-22 DIAGNOSIS — Z170 Estrogen receptor positive status [ER+]: Secondary | ICD-10-CM | POA: Insufficient documentation

## 2023-11-22 DIAGNOSIS — C50211 Malignant neoplasm of upper-inner quadrant of right female breast: Secondary | ICD-10-CM | POA: Diagnosis present

## 2023-11-22 DIAGNOSIS — K769 Liver disease, unspecified: Secondary | ICD-10-CM | POA: Diagnosis present

## 2023-11-22 MED ORDER — GADOBUTROL 1 MMOL/ML IV SOLN
8.0000 mL | Freq: Once | INTRAVENOUS | Status: AC | PRN
  Administered 2023-11-22: 8 mL via INTRAVENOUS

## 2023-11-24 ENCOUNTER — Ambulatory Visit: Payer: Self-pay

## 2023-11-24 NOTE — Telephone Encounter (Signed)
-----   Message from Nurse Ivar Mariscal sent at 11/24/2023  5:25 PM EDT ----- She was made aware of her results via phone. She saw PT 6/3 and they are ordering her a sleeve.  -LB ----- Message ----- From: Percival Brace, NP Sent: 11/24/2023   8:24 AM EDT To: [facility] Bc 4  MR liver is negative for cancer.  Hepatic cysts are seen, good news.  Please check on her arm swelling--has she seen PT? ----- Message ----- From: Interface, Rad Results In Sent: 11/23/2023  11:53 PM EDT To: Percival Brace, NP

## 2023-11-29 ENCOUNTER — Ambulatory Visit

## 2023-11-29 DIAGNOSIS — I972 Postmastectomy lymphedema syndrome: Secondary | ICD-10-CM

## 2023-11-29 DIAGNOSIS — Z170 Estrogen receptor positive status [ER+]: Secondary | ICD-10-CM

## 2023-11-29 NOTE — Patient Instructions (Signed)
 Cancer Rehab [phone]  Start with circles near neck placing hands behind collarbones x 10 times each side.   Deep Effective Breath   Standing, sitting, or laying down, place both hands on the belly. Take a deep breath IN, expanding the belly; then breath OUT, contracting the belly. Repeat __5__ times. Do __2-3__ sessions per day and before your self massage.   Axilla to Axilla - Sweep   On uninvolved side make 5 circles in the armpit, then pump _5__ times from involved armpit across chest to uninvolved armpit, making a pathway. Do _1__ time per day.  Copyright  VHI. All rights reserved.  Axilla to Inguinal Nodes - Sweep   On involved side, make 5 circles at groin at panty line, then pump _5__ times from armpit along side of trunk to outer hip, making your other pathway. Do __1_ time per day.  Copyright  VHI. All rights reserved.  Arm Posterior: Elbow to Shoulder - Sweep   Pump _5__ times from back of elbow to top of shoulder. Then inner to outer upper arm _5_ times, then outer arm again _5_ times. Then back to the pathways _2-3_ times. Do _1__ time per day.  Copyright  VHI. All rights reserved.  ARM: Volar Wrist to Elbow - Sweep   Pump or stationary circles _5__ times from wrist to elbow making sure to do both sides of the forearm. Then retrace your steps to the outer arm, and the pathways _2-3_ times each. Do _1__ time per day.  Copyright  VHI. All rights reserved.  ARM: Dorsum of Hand to Shoulder - Sweep   Pump or stationary circles _5__ times on back of hand including knuckle spaces and individual fingers if needed working up towards the wrist, then retrace all your steps working back up the forearm, doing both sides; upper outer arm and back to your pathways _2-3_ times each. Then do 5 circles again at uninvolved armpit and involved groin where you started! Good job!! Do __1_ time per day.

## 2023-11-29 NOTE — Therapy (Signed)
 OUTPATIENT PHYSICAL THERAPY  UPPER EXTREMITY ONCOLOGY TREATMENT  Patient Name: Miranda Ayers MRN: [id number] DOB:01/03/1986, 38 y.o., female Today's Date: 11/29/2023  END OF SESSION:  PT End of Session - 11/29/23 1708     Visit Number 2    Number of Visits 9    Date for PT Re-Evaluation 12/19/23    PT Start Time 1503    PT Stop Time 1558    PT Time Calculation (min) 55 min    Activity Tolerance Patient tolerated treatment well    Behavior During Therapy [facility] for tasks assessed/performed             Past Medical History:  Diagnosis Date   BRCA negative 10/2022   MyRisk neg   Breast cancer (HCC)    Cancer (HCC)    breast cancer   CHF (congestive heart failure) (HCC)    Family history of colon cancer    Family history of lung cancer    Family history of pancreatic cancer    Hypertension    Past Surgical History:  Procedure Laterality Date   MASTECTOMY     MASTECTOMY WITH RADIOACTIVE SEED GUIDED EXCISION AND AXILLARY SENTINEL LYMPH NODE BIOPSY Right 11/20/2018   Procedure: RIGHT SIMPLE MASTECTOMY WITH RIGHT RADIOACTIVE SEED TARGETED LYMPH NODE BIOPSY AND RIGHT  AXILLARY SENTINEL LYMPH NODE MAPPING AND PORT REMOVEL RIGHT CHEST;  Surgeon: Sim Dryer, MD;  Location: [facility];  Service: General;  Laterality: Right;   OOPHORECTOMY  2013   PORTACATH PLACEMENT N/A 05/08/2018   Procedure: INSERTION PORT-A-CATH WITH ULTRASOUND;  Surgeon: Sim Dryer, MD;  Location: [facility];  Service: General;  Laterality: N/A;   Patient Active Problem List   Diagnosis Date Noted   Neutropenia, drug-induced (HCC) 08/20/2018   Genetic testing 06/22/2018   Family history of lung cancer    Family history of colon cancer    Family history of pancreatic cancer    Malignant neoplasm of upper-inner quadrant of right breast in female, estrogen receptor positive (HCC) 04/19/2018    PCP: Alphonsa Asai, DO  REFERRING PROVIDER: Percival Brace,  NP  REFERRING DIAG: M79.89 (ICD-10-CM) - Arm swelling C50.211,Z17.0 (ICD-10-CM) - Malignant neoplasm of upper-inner quadrant of right breast in female, estrogen receptor positive (HCC) R22.1 (ICD-10-CM) - Neck swelling  THERAPY DIAG:  Postmastectomy lymphedema  Malignant neoplasm of upper-inner quadrant of right breast in female, estrogen receptor positive (HCC)  ONSET DATE: 11/11/23  Rationale for Evaluation and Treatment: Rehabilitation  SUBJECTIVE:  SUBJECTIVE STATEMENT: I ordered the compression garments from the link Blaire sent me.  PERTINENT HISTORY: 11/20/18- right mastectomy for treatment of right breast cancer as well as SLNB (2 of 4 nodes positive), pt completed chemo and radiation. Had negative doppler and CT did not show any issues.   PAIN:  Are you having pain? Yes NPRS scale: 1/10 Pain location: R hand and forearm Pain orientation: Right  PAIN TYPE: tender Pain description: intermittent  Aggravating factors: touching it, when the swelling is worse Relieving factors: not touching it  PRECAUTIONS: Other: R UE lymphedema  RED FLAGS: None   WEIGHT BEARING RESTRICTIONS: No  FALLS:  Has patient fallen in last 6 months? 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  COGNITION: Overall cognitive status: Within functional limits for tasks assessed   PALPATION: Fullness  palpable at dorsal surface of hand  OBSERVATIONS / OTHER ASSESSMENTS: R hand, wrist and forearm visibly swollen compared to L side  POSTURE: forward head, rounded shoulders  UPPER EXTREMITY AROM/PROM: WFL   LYMPHEDEMA ASSESSMENTS:   LANDMARK RIGHT  eval  At axilla  33.5  15 cm proximal to olecranon process 32.7  10 cm proximal to olecranon process 30  Olecranon process 26  15 cm proximal to ulnar styloid process 26.5  10 cm proximal to ulnar styloid process 22.2  Just proximal to ulnar styloid process 17.4  Across hand at thumb web space 20  At base of 2nd digit 6.2  (Blank rows = not tested)  LANDMARK LEFT  eval  At axilla  34  15 cm proximal to olecranon process 34  10 cm proximal to olecranon process 30.2  Olecranon process 25.4  15 cm proximal to ulnar styloid process 24.8  10 cm proximal to ulnar styloid process 21.2  Just proximal to ulnar styloid process 16  Across hand at thumb web space 18.2  At base of 2nd digit 5.8  (Blank rows = not tested)                                                                                                                             TREATMENT DATE:  11/29/23: Self Care Spent time at beginning of session answering pts questions about MLD and compression bandaging. Then educated her about the active vs maintenance phases of treatment and our expectations for her for CDT. Decided to begin teaching pt self MLD today and then will focus on compression bandaging next session when pt reports her mom can come to also learn. Then throughout MLD sequence today had pt return demo of  each step and offered hand over hand cuing and VC's for correct pressure and skin stretch. She was able to return excellent demo of both by end of session. Handout issued. At end of session pt wsa instructed in remedial exercises to perform with bandages on and to perform these if she feels any tingling or pain, if symptoms persist to remove bandages and bring all  back.  Manual Therapy MLD to Rt UE: Short neck, 5 diaphragmatic breaths, Lt axillary and pectoral nodes, anterior intact thorax sequence, anterior inter-axillary anastomosis, Rt inguinal nodes, Rt axillo-inguinal anastomosis, then focused on Rt UE working from proximal to distal then retracing all steps having pt return demo of each step per handout issued today. Compression Bandaging: Cocoa butter to Rt UE, stockinette, Molelast to fingers 1-4, Artiflex from hand to axilla, then short stretch compression bandages as follows: 6 cm to hand/wrist, 10 cm spiral from wrist with X at elbow, and 10 cm spiral from wrist to axilla.   11/21/23: Measured pt for compression sleeve and glove today and sent info for pt to obtain through [employer]. Pt fit in to M2 Sigvaris secure sleeve and small glove in black.     PATIENT EDUCATION:  Education details:  Self MLD Person educated: Patient Education method: Explanation, demonstration, Actor and VC's, handout issued Education comprehension: verbalized understanding, returned demo, tactile and VC's and will benefit from further review  HOME EXERCISE PROGRAM: 11/29/23: Self MLD  ASSESSMENT:  CLINICAL IMPRESSION: Patient has ordered the compression garments per last session. Today began CDT of Rt UE. Focused on instruction of self MLD which pt did excellent at. Encouraged her to review this tonight for further reinforcement, she agreed. Pt is to bring bandages back to next session on Monday and will then instruct her, and mom if she can come, in self bandaging.     OBJECTIVE IMPAIRMENTS: decreased knowledge of condition, decreased knowledge of use of DME, increased edema, and pain.   ACTIVITY LIMITATIONS: carrying and lifting  PARTICIPATION LIMITATIONS: occupation  PERSONAL FACTORS: none are also affecting patient's functional outcome.   REHAB POTENTIAL: Good  CLINICAL DECISION MAKING: Stable/uncomplicated  EVALUATION COMPLEXITY: Low  GOALS: Goals  reviewed with patient? Yes  SHORT TERM GOALS=LONG TERM GOALS Target date: 12/19/23  Pt will be able to independently bandage her R UE for long term management of lymphedema.  Baseline: Goal status: INITIAL  2.  Pt will be independent in self MLD for long term management of lymphedema.  Baseline:  Goal status: INITIAL  3.  Pt will obtain compression garments for long term management of lymphedema.  Baseline:  Goal status: INITIAL  4. Pt will be able to verbalize lymphedema risk reduction practices   Goal status: INITIAL    PLAN:  PT FREQUENCY: 2x/week  PT DURATION: 4 weeks  PLANNED INTERVENTIONS: 97164- PT Re-evaluation, 97110-Therapeutic exercises, 97530- Therapeutic activity, 97535- Self Care, 28413- Manual therapy, 97760- Orthotic Initial, [phone]- Orthotic/Prosthetic subsequent, Patient/Family education, Balance training, Joint mobilization, Therapeutic exercises, Therapeutic activity, Neuromuscular re-education, Gait training, and Self Care  PLAN FOR NEXT SESSION: instruct in self bandaging (and pts mom if she comes) and review self MLD, assess garments for fit once they arrive (sent link to obtain through [employer])  Denyce Flank, PTA 11/29/2023, 5:22 PM

## 2023-12-04 ENCOUNTER — Ambulatory Visit

## 2023-12-04 DIAGNOSIS — Z170 Estrogen receptor positive status [ER+]: Secondary | ICD-10-CM

## 2023-12-04 DIAGNOSIS — I972 Postmastectomy lymphedema syndrome: Secondary | ICD-10-CM

## 2023-12-04 NOTE — Therapy (Signed)
 OUTPATIENT PHYSICAL THERAPY  UPPER EXTREMITY ONCOLOGY TREATMENT  Patient Name: Miranda Ayers MRN: [id number] DOB:1986/05/27, 38 y.o., female Today's Date: 12/04/2023  END OF SESSION:  PT End of Session - 12/04/23 0917     Visit Number 3    Number of Visits 9    Date for PT Re-Evaluation 12/19/23    PT Start Time 0912   pt arrived late   PT Stop Time 0958    PT Time Calculation (min) 46 min    Activity Tolerance Patient tolerated treatment well    Behavior During Therapy [facility] for tasks assessed/performed          Past Medical History:  Diagnosis Date   BRCA negative 10/2022   MyRisk neg   Breast cancer (HCC)    Cancer (HCC)    breast cancer   CHF (congestive heart failure) (HCC)    Family history of colon cancer    Family history of lung cancer    Family history of pancreatic cancer    Hypertension    Past Surgical History:  Procedure Laterality Date   MASTECTOMY     MASTECTOMY WITH RADIOACTIVE SEED GUIDED EXCISION AND AXILLARY SENTINEL LYMPH NODE BIOPSY Right 11/20/2018   Procedure: RIGHT SIMPLE MASTECTOMY WITH RIGHT RADIOACTIVE SEED TARGETED LYMPH NODE BIOPSY AND RIGHT  AXILLARY SENTINEL LYMPH NODE MAPPING AND PORT REMOVEL RIGHT CHEST;  Surgeon: Sim Dryer, MD;  Location: [facility];  Service: General;  Laterality: Right;   OOPHORECTOMY  2013   PORTACATH PLACEMENT N/A 05/08/2018   Procedure: INSERTION PORT-A-CATH WITH ULTRASOUND;  Surgeon: Sim Dryer, MD;  Location: [facility];  Service: General;  Laterality: N/A;   Patient Active Problem List   Diagnosis Date Noted   Neutropenia, drug-induced (HCC) 08/20/2018   Genetic testing 06/22/2018   Family history of lung cancer    Family history of colon cancer    Family history of pancreatic cancer    Malignant neoplasm of upper-inner quadrant of right breast in female, estrogen receptor positive (HCC) 04/19/2018    PCP: Alphonsa Asai, DO  REFERRING PROVIDER: Percival Brace, NP  REFERRING DIAG: M79.89 (ICD-10-CM) - Arm swelling C50.211,Z17.0 (ICD-10-CM) - Malignant neoplasm of upper-inner quadrant of right breast in female, estrogen receptor positive (HCC) R22.1 (ICD-10-CM) - Neck swelling  THERAPY DIAG:  Postmastectomy lymphedema  Malignant neoplasm of upper-inner quadrant of right breast in female, estrogen receptor positive (HCC)  ONSET DATE: 11/11/23  Rationale for Evaluation and Treatment: Rehabilitation  SUBJECTIVE:  SUBJECTIVE STATEMENT: My arm was smaller and I could see my knuckles when I took the bandages off! I left them off until Friday and washed them over the weekend. I didn't do the self MLD yet because we threw a party for my moms bday.   PERTINENT HISTORY: 11/20/18- right mastectomy for treatment of right breast cancer as well as SLNB (2 of 4 nodes positive), pt completed chemo and radiation. Had negative doppler and CT did not show any issues.   PAIN:  Are you having pain? Yes NPRS scale: 1/10 Pain location: R hand and forearm Pain orientation: Right  PAIN TYPE: tender Pain description: intermittent  Aggravating factors: touching it, when the swelling is worse Relieving factors: not touching it  PRECAUTIONS: Other: R UE lymphedema  RED FLAGS: None   WEIGHT BEARING RESTRICTIONS: No  FALLS:  Has patient fallen in last 6 months? 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  COGNITION: Overall cognitive status: Within functional limits for tasks assessed   PALPATION: Fullness palpable at dorsal surface of hand  OBSERVATIONS / OTHER ASSESSMENTS: R hand, wrist and forearm visibly swollen compared to L side  POSTURE: forward head, rounded shoulders  UPPER EXTREMITY AROM/PROM: WFL   LYMPHEDEMA ASSESSMENTS:   LANDMARK RIGHT  eval  At axilla  33.5  15 cm proximal to olecranon process 32.7  10 cm proximal to olecranon process 30  Olecranon process 26  15 cm proximal to ulnar styloid process 26.5  10 cm proximal to ulnar styloid process 22.2  Just proximal to ulnar styloid process 17.4  Across hand at thumb web space 20  At base of 2nd digit 6.2  (Blank rows = not tested)  LANDMARK LEFT  eval  At axilla  34  15 cm proximal to olecranon process 34  10 cm proximal to olecranon process 30.2  Olecranon process 25.4  15 cm proximal to ulnar styloid process 24.8  10 cm proximal to ulnar styloid process 21.2  Just proximal to ulnar styloid process 16  Across hand at thumb web space 18.2  At base of 2nd digit 5.8  (Blank rows = not tested)                                                                                                                             TREATMENT DATE:  12/04/23: Manual Therapy MLD to Rt UE: Short neck, 5 diaphragmatic breaths, Lt axillary and pectoral nodes, anterior intact thorax sequence, anterior inter-axillary anastomosis, Rt inguinal nodes, Rt axillo-inguinal anastomosis, then focused on Rt UE working from proximal  to distal then retracing all steps. P/ROM to Rt shoulder into flex, abd and D2 with scapular depression by therapist throughout STM to Rt lateral trunk inferior to axilla where pt palpably tight Compression Bandaging: Cocoa butter to Rt UE, stockinette, Molelast to fingers 1-4, Artiflex from hand to axilla, then short stretch compression bandages as follows: 6  cm to hand/wrist, 10 cm spiral from wrist with X at elbow, and 10 cm spiral from wrist to axilla.   11/29/23: Self Care Spent time at beginning of session answering pts questions about MLD and compression bandaging. Then educated her about the active vs maintenance phases of treatment and our expectations for her for CDT. Decided to begin teaching pt self MLD today and then will focus on compression bandaging next session when pt reports her mom can come to also learn. Then throughout MLD sequence today had pt return demo of each step and offered hand over hand cuing and VC's for correct pressure and skin stretch. She was able to return excellent demo of both by end of session. Handout issued. At end of session pt wsa instructed in remedial exercises to perform with bandages on and to perform these if she feels any tingling or pain, if symptoms persist to remove bandages and bring all back.  Manual Therapy MLD to Rt UE: Short neck, 5 diaphragmatic breaths, Lt axillary and pectoral nodes, anterior intact thorax sequence, anterior inter-axillary anastomosis, Rt inguinal nodes, Rt axillo-inguinal anastomosis, then focused on Rt UE working from proximal to distal then retracing all steps having pt return demo of each step per handout issued today. Compression Bandaging: Cocoa butter to Rt UE, stockinette, Molelast to fingers 1-4, Artiflex from hand to axilla, then short stretch compression bandages as follows: 6 cm to hand/wrist, 10 cm spiral from wrist with X at elbow, and 10 cm spiral from wrist to axilla.   11/21/23: Measured pt for compression sleeve and glove today and sent info for pt to obtain through [employer]. Pt fit in to M2 Sigvaris secure sleeve and small glove in black.     PATIENT EDUCATION:  Education details:  Self MLD Person educated: Patient Education method: Explanation, demonstration, Actor and VC's, handout issued Education comprehension: verbalized understanding, returned demo,  tactile and VC's and will benefit from further review  HOME EXERCISE PROGRAM: 11/29/23: Self MLD  ASSESSMENT:  CLINICAL IMPRESSION: Continued with CDT to Rt UE and added P/ROM and STM for Rt shoulder end ROM tightness. Pt is awaiting arrival of new compression garments. Will assess fit of these when they arrive.    OBJECTIVE IMPAIRMENTS: decreased knowledge of condition, decreased knowledge of use of DME, increased edema, and pain.   ACTIVITY LIMITATIONS: carrying and lifting  PARTICIPATION LIMITATIONS: occupation  PERSONAL FACTORS: none are also affecting patient's functional outcome.   REHAB POTENTIAL: Good  CLINICAL DECISION MAKING: Stable/uncomplicated  EVALUATION COMPLEXITY: Low  GOALS: Goals reviewed with patient? Yes  SHORT TERM GOALS=LONG TERM GOALS Target date: 12/19/23  Pt will be able to independently bandage her R UE for long term management of lymphedema.  Baseline: Goal status: INITIAL  2.  Pt will be independent in self MLD for long term management of lymphedema.  Baseline:  Goal status: INITIAL  3.  Pt will obtain compression garments for long term management of lymphedema.  Baseline:  Goal status: INITIAL  4. Pt will be able to verbalize lymphedema risk reduction practices   Goal status: INITIAL    PLAN:  PT FREQUENCY: 2x/week  PT DURATION:  4 weeks  PLANNED INTERVENTIONS: 97164- PT Re-evaluation, 97110-Therapeutic exercises, 97530- Therapeutic activity, 97535- Self Care, 91478- Manual therapy, [phone]- Orthotic Initial, [phone]- Orthotic/Prosthetic subsequent, Patient/Family education, Balance training, Joint mobilization, Therapeutic exercises, Therapeutic activity, Neuromuscular re-education, Gait training, and Self Care  PLAN FOR NEXT SESSION: Measure circumference; instruct in self bandaging (and pts mom if she comes) and review self MLD, assess garments for fit once they arrive (sent link to obtain through [employer])  Denyce Flank,  PTA 12/04/2023, 10:25 AM

## 2023-12-06 ENCOUNTER — Ambulatory Visit

## 2023-12-06 DIAGNOSIS — I972 Postmastectomy lymphedema syndrome: Secondary | ICD-10-CM

## 2023-12-06 DIAGNOSIS — C50211 Malignant neoplasm of upper-inner quadrant of right female breast: Secondary | ICD-10-CM

## 2023-12-06 NOTE — Therapy (Signed)
 OUTPATIENT PHYSICAL THERAPY  UPPER EXTREMITY ONCOLOGY TREATMENT  Patient Name: Miranda Ayers MRN: [id number] DOB:[birth year], 38 y.o., female Today's Date: 12/06/2023  END OF SESSION:  PT End of Session - 12/06/23 0912     Visit Number 4    Number of Visits 9    Date for PT Re-Evaluation 12/19/23    PT Start Time 0909   pt arrived late   PT Stop Time 1000    PT Time Calculation (min) 51 min    Activity Tolerance Patient tolerated treatment well    Behavior During Therapy [facility] for tasks assessed/performed          Past Medical History:  Diagnosis Date   BRCA negative 10/2022   MyRisk neg   Breast cancer (HCC)    Cancer (HCC)    breast cancer   CHF (congestive heart failure) (HCC)    Family history of colon cancer    Family history of lung cancer    Family history of pancreatic cancer    Hypertension    Past Surgical History:  Procedure Laterality Date   MASTECTOMY     MASTECTOMY WITH RADIOACTIVE SEED GUIDED EXCISION AND AXILLARY SENTINEL LYMPH NODE BIOPSY Right 11/20/2018   Procedure: RIGHT SIMPLE MASTECTOMY WITH RIGHT RADIOACTIVE SEED TARGETED LYMPH NODE BIOPSY AND RIGHT  AXILLARY SENTINEL LYMPH NODE MAPPING AND PORT REMOVEL RIGHT CHEST;  Surgeon: Sim Dryer, MD;  Location: [facility];  Service: General;  Laterality: Right;   OOPHORECTOMY  2013   PORTACATH PLACEMENT N/A 05/08/2018   Procedure: INSERTION PORT-A-CATH WITH ULTRASOUND;  Surgeon: Sim Dryer, MD;  [facility];  Service: General;  Laterality: N/A;   Patient Active Problem List   Diagnosis Date Noted   Neutropenia, drug-induced (HCC) 08/20/2018   Genetic testing 06/22/2018   Family history of lung cancer    Family history of colon cancer    Family history of pancreatic cancer    Malignant neoplasm of upper-inner quadrant of right breast in female, estrogen receptor positive (HCC) 04/19/2018    PCP: Alphonsa Asai, DO  REFERRING PROVIDER: Percival Brace, NP  REFERRING DIAG: M79.89 (ICD-10-CM) - Arm swelling C50.211,Z17.0 (ICD-10-CM) - Malignant neoplasm of upper-inner quadrant of right breast in female, estrogen receptor positive (HCC) R22.1 (ICD-10-CM) - Neck swelling  THERAPY DIAG:  Postmastectomy lymphedema  Malignant neoplasm of upper-inner quadrant of right breast in female, estrogen receptor positive (HCC)  ONSET DATE: 11/11/23  Rationale for Evaluation and Treatment: Rehabilitation  SUBJECTIVE:  SUBJECTIVE STATEMENT: I brought my daughter to help me with bandaging at home.   PERTINENT HISTORY: 11/20/18- right mastectomy for treatment of right breast cancer as well as SLNB (2 of 4 nodes positive), pt completed chemo and radiation. Had negative doppler and CT did not show any issues.   PAIN:  Are you having pain? Yes NPRS scale: 1/10 Pain location: R hand and forearm Pain orientation: Right  PAIN TYPE: tender Pain description: intermittent  Aggravating factors: touching it, when the swelling is worse Relieving factors: not touching it  PRECAUTIONS: Other: R UE lymphedema  RED FLAGS: None   WEIGHT BEARING RESTRICTIONS: No  FALLS:  Has patient fallen in last 6 months? 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  COGNITION: Overall cognitive status: Within functional limits for tasks  assessed   PALPATION: Fullness palpable at dorsal surface of hand  OBSERVATIONS / OTHER ASSESSMENTS: R hand, wrist and forearm visibly swollen compared to L side  POSTURE: forward head, rounded shoulders  UPPER EXTREMITY AROM/PROM: WFL   LYMPHEDEMA ASSESSMENTS:   LANDMARK RIGHT  eval Right 12/06/23  At axilla  33.5 32.7  15 cm proximal to olecranon process 32.7 32.3  10 cm proximal to olecranon process 30 29.4  Olecranon process 26 25.4  15 cm proximal to ulnar styloid process 26.5 25.8  10 cm proximal to ulnar styloid process 22.2 22.7  Just proximal to ulnar styloid process 17.4 16.5  Across hand at thumb web space 20 19.7  At base of 2nd digit 6.2 6  (Blank rows = not tested)  LANDMARK LEFT  eval  At axilla  34  15 cm proximal to olecranon process 34  10 cm proximal to olecranon process 30.2  Olecranon process 25.4  15 cm proximal to ulnar styloid process 24.8  10 cm proximal to ulnar styloid process 21.2  Just proximal to ulnar styloid process 16  Across hand at thumb web space 18.2  At base of 2nd digit 5.8  (Blank rows = not tested)                                                                                                                             TREATMENT DATE:  12/06/23: Spent session instructing pt and daughter in following: Compression Bandaging: Cocoa butter to Rt UE, stockinette, Molelast to fingers 1-4, Artiflex from hand to axilla, then short stretch compression bandages as follows: 6 cm to hand/wrist, 10 cm spiral from wrist with X at elbow, and 10 cm spiral from wrist to axilla.  Daughter videorecorded while therapist applied  first bandage, removed and then had daughter apply bandages with therapist offering tactile and VC's throughout. Then therapist applied bandage one final time.  12/04/23: Manual Therapy MLD to Rt UE: Short neck, 5 diaphragmatic breaths, Lt axillary and pectoral nodes, anterior intact thorax sequence, anterior inter-axillary  anastomosis, Rt inguinal nodes, Rt axillo-inguinal anastomosis, then focused on Rt UE working from proximal to distal then retracing all steps. P/ROM to Rt shoulder into flex, abd and D2 with scapular depression by therapist throughout STM to Rt lateral trunk inferior to axilla where pt palpably tight Compression Bandaging: Cocoa butter to Rt UE, stockinette, Molelast to fingers 1-4, Artiflex from hand to axilla, then short stretch compression bandages as follows: 6 cm to hand/wrist, 10 cm spiral from wrist with X at elbow, and 10 cm spiral from wrist to axilla.   11/29/23: Self Care Spent time at beginning of session answering pts questions about MLD and compression bandaging. Then educated her about the active vs maintenance phases of treatment and our expectations for her for CDT. Decided to begin teaching pt self MLD today and then will focus on compression bandaging next session when pt reports her mom can come to also learn. Then throughout MLD sequence today had pt return demo of each step and offered hand over hand cuing and VC's for correct pressure and skin stretch. She was able to return excellent demo of both by end of session. Handout issued. At end of session pt wsa instructed in remedial exercises to perform with bandages on and to perform these if she feels any tingling or pain, if symptoms persist to remove bandages and bring all back.  Manual Therapy MLD to Rt UE: Short neck, 5 diaphragmatic breaths, Lt axillary and pectoral nodes, anterior intact thorax sequence, anterior inter-axillary anastomosis, Rt inguinal nodes, Rt axillo-inguinal anastomosis, then focused on Rt UE working from proximal to distal then retracing all steps having pt return demo of each step per handout issued today. Compression Bandaging: Cocoa butter to Rt UE, stockinette, Molelast to fingers 1-4, Artiflex from hand to axilla, then short stretch compression bandages as follows: 6 cm to hand/wrist, 10 cm spiral from  wrist with X at elbow, and 10 cm spiral from wrist to axilla.   11/21/23: Measured pt for compression sleeve and glove today and sent info for pt to obtain through [employer]. Pt fit in to M2 Sigvaris secure sleeve and small glove in black.     PATIENT EDUCATION:  Education details:  Self compression bandaging Person educated: Patient and daughter Education method: Explanation, demonstration, tactile and VC's, handout issued Education comprehension: verbalized understanding, returned demo, tactile and VC's and will benefit from further review  HOME EXERCISE PROGRAM: 11/29/23: Self MLD 12/06/23: Self compression bandaging  ASSESSMENT:  CLINICAL IMPRESSION: Today educated daughter and pt in self compression bandaging. Daughter was able to return fairly good demo and pt was encouraged to offer feedback to daughter during for technique and tightness.    OBJECTIVE IMPAIRMENTS: decreased knowledge of condition, decreased knowledge of use of DME, increased edema, and pain.   ACTIVITY LIMITATIONS: carrying and lifting  PARTICIPATION LIMITATIONS: occupation  PERSONAL FACTORS: none are also affecting patient's functional outcome.   REHAB POTENTIAL: Good  CLINICAL DECISION MAKING: Stable/uncomplicated  EVALUATION COMPLEXITY: Low  GOALS: Goals reviewed with patient? Yes  SHORT TERM GOALS=LONG TERM GOALS Target date: 12/19/23  Pt will be able to independently bandage her R UE for long term management of lymphedema.  Baseline: Goal status: INITIAL  2.  Pt will be independent in self MLD for long term management of lymphedema.  Baseline:  Goal status: INITIAL  3.  Pt will obtain compression garments for long term management of lymphedema.  Baseline:  Goal status: INITIAL  4. Pt will be able to verbalize lymphedema risk reduction practices   Goal status: INITIAL    PLAN:  PT FREQUENCY: 2x/week  PT DURATION: 4 weeks  PLANNED INTERVENTIONS: 97164- PT Re-evaluation,  97110-Therapeutic exercises, 97530- Therapeutic activity, 97535- Self Care, 91478- Manual therapy, 97760- Orthotic Initial, [phone]- Orthotic/Prosthetic subsequent, Patient/Family education, Balance training, Joint mobilization, Therapeutic exercises, Therapeutic activity, Neuromuscular re-education, Gait training, and Self Care  PLAN FOR NEXT SESSION: Review self bandaging and review self MLD, assess garments for fit once they arrive (sent link to obtain through [employer])  Denyce Flank, PTA 12/06/2023, 10:05 AM

## 2023-12-11 ENCOUNTER — Ambulatory Visit: Admitting: Rehabilitation

## 2023-12-11 ENCOUNTER — Encounter: Payer: Self-pay | Admitting: Rehabilitation

## 2023-12-11 DIAGNOSIS — C50211 Malignant neoplasm of upper-inner quadrant of right female breast: Secondary | ICD-10-CM

## 2023-12-11 DIAGNOSIS — I972 Postmastectomy lymphedema syndrome: Secondary | ICD-10-CM

## 2023-12-11 NOTE — Therapy (Signed)
 OUTPATIENT PHYSICAL THERAPY  UPPER EXTREMITY ONCOLOGY TREATMENT  Patient Name: Miranda Ayers MRN: [id number] DOB:03-24-1986, 38 y.o., female Today's Date: 12/11/2023  END OF SESSION:  PT End of Session - 12/11/23 0907     Visit Number 5    Number of Visits 9    Date for PT Re-Evaluation 12/19/23    PT Start Time 0908    PT Stop Time 0956    PT Time Calculation (min) 48 min    Activity Tolerance Patient tolerated treatment well    Behavior During Therapy [facility] for tasks assessed/performed          Past Medical History:  Diagnosis Date   BRCA negative 10/2022   MyRisk neg   Breast cancer (HCC)    Cancer (HCC)    breast cancer   CHF (congestive heart failure) (HCC)    Family history of colon cancer    Family history of lung cancer    Family history of pancreatic cancer    Hypertension    Past Surgical History:  Procedure Laterality Date   MASTECTOMY     MASTECTOMY WITH RADIOACTIVE SEED GUIDED EXCISION AND AXILLARY SENTINEL LYMPH NODE BIOPSY Right 11/20/2018   Procedure: RIGHT SIMPLE MASTECTOMY WITH RIGHT RADIOACTIVE SEED TARGETED LYMPH NODE BIOPSY AND RIGHT  AXILLARY SENTINEL LYMPH NODE MAPPING AND PORT REMOVEL RIGHT CHEST;  Surgeon: [name] [name], MD;  Location: [facility];  Service: General;  Laterality: Right;   OOPHORECTOMY  2013   PORTACATH PLACEMENT N/A 05/08/2018   Procedure: INSERTION PORT-A-CATH WITH ULTRASOUND;  Surgeon: [name] [name], MD;  Location: [facility];  Service: General;  Laterality: N/A;   Patient Active Problem List   Diagnosis Date Noted   Neutropenia, drug-induced (HCC) 08/20/2018   Genetic testing 06/22/2018   Family history of lung cancer    Family history of colon cancer    Family history of pancreatic cancer    Malignant neoplasm of upper-inner quadrant of right breast in female, estrogen receptor positive (HCC) 04/19/2018    PCP: Almarie Jaeger, DO  REFERRING PROVIDER: Morna Crawford Pickle,  NP  REFERRING DIAG: M79.89 (ICD-10-CM) - Arm swelling C50.211,Z17.0 (ICD-10-CM) - Malignant neoplasm of upper-inner quadrant of right breast in female, estrogen receptor positive (HCC) R22.1 (ICD-10-CM) - Neck swelling  THERAPY DIAG:  Postmastectomy lymphedema  Malignant neoplasm of upper-inner quadrant of right breast in female, estrogen receptor positive (HCC)  ONSET DATE: 11/11/23  Rationale for Evaluation and Treatment: Rehabilitation  SUBJECTIVE:  SUBJECTIVE STATEMENT: I took the bandage off on Friday.    PERTINENT HISTORY: 11/20/18- right mastectomy for treatment of right breast cancer as well as SLNB (2 of 4 nodes positive), pt completed chemo and radiation. Had negative doppler and CT did not show any issues.   PAIN:  Are you having pain? Yes NPRS scale: 1/10 Pain location: R hand and forearm Pain orientation: Right  PAIN TYPE: tender Pain description: intermittent  Aggravating factors: touching it, when the swelling is worse Relieving factors: not touching it  PRECAUTIONS: Other: R UE lymphedema  RED FLAGS: None   WEIGHT BEARING RESTRICTIONS: No  FALLS:  Has patient fallen in last 6 months? 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  COGNITION: Overall cognitive status: Within functional limits for tasks assessed   PALPATION: Fullness palpable at dorsal surface of  hand  OBSERVATIONS / OTHER ASSESSMENTS: R hand, wrist and forearm visibly swollen compared to L side  POSTURE: forward head, rounded shoulders  UPPER EXTREMITY AROM/PROM: WFL   LYMPHEDEMA ASSESSMENTS:   LANDMARK RIGHT  eval Right 12/06/23  At axilla  33.5 32.7  15 cm proximal to olecranon process 32.7 32.3  10 cm proximal to olecranon process 30 29.4  Olecranon process 26 25.4  15 cm proximal to ulnar styloid process 26.5 25.8  10 cm proximal to ulnar styloid process 22.2 22.7  Just proximal to ulnar styloid process 17.4 16.5  Across hand at thumb web space 20 19.7  At base of 2nd digit 6.2 6  (Blank rows = not tested)  LANDMARK LEFT  eval  At axilla  34  15 cm proximal to olecranon process 34  10 cm proximal to olecranon process 30.2  Olecranon process 25.4  15 cm proximal to ulnar styloid process 24.8  10 cm proximal to ulnar styloid process 21.2  Just proximal to ulnar styloid process 16  Across hand at thumb web space 18.2  At base of 2nd digit 5.8  (Blank rows = not tested)                                                                                                                             TREATMENT DATE:  12/11/23: Reviewed self MLD for the Rt UE: Short neck, 5 diaphragmatic breaths, Lt axillary and pectoral nodes, anterior intact thorax sequence, anterior inter-axillary anastomosis, Rt inguinal nodes, Rt axillo-inguinal anastomosis, then focused on Rt UE working from proximal to distal then retracing all steps. Pt did very well without cueing needed except for location of clavicular circles.   Compression Bandaging: Cocoa  butter to Rt UE, stockinette, Molelast to fingers 1-4, Artiflex from hand to axilla, then short stretch compression bandages as follows: 6 cm to hand/wrist, 10 cm spiral from wrist with X at elbow, and 10 cm spiral from wrist to axilla.    12/04/23: Manual Therapy MLD to Rt UE: Short neck, 5 diaphragmatic breaths, Lt axillary and pectoral  nodes, anterior intact thorax sequence, anterior inter-axillary anastomosis, Rt inguinal nodes, Rt axillo-inguinal anastomosis, then focused on Rt UE working from proximal to distal then retracing all steps. P/ROM to Rt shoulder into flex, abd and D2 with scapular depression by therapist throughout STM to Rt lateral trunk inferior to axilla where pt palpably tight Compression Bandaging: Cocoa butter to Rt UE, stockinette, Molelast to fingers 1-4, Artiflex from hand to axilla, then short stretch compression bandages as follows: 6 cm to hand/wrist, 10 cm spiral from wrist with X at elbow, and 10 cm spiral from wrist to axilla.   12/06/23: Spent session instructing pt and daughter in following: Compression Bandaging: Cocoa butter to Rt UE, stockinette, Molelast to fingers 1-4, Artiflex from hand to axilla, then short stretch compression bandages as follows: 6 cm to hand/wrist, 10 cm spiral from wrist with X at elbow, and 10 cm spiral from wrist to axilla.  Daughter videorecorded while therapist applied first bandage, removed and then had daughter apply bandages with therapist offering tactile and VC's throughout. Then therapist applied bandage one final time.   12/04/23: Manual Therapy MLD to Rt UE: Short neck, 5 diaphragmatic breaths, Lt axillary and pectoral nodes, anterior intact thorax sequence, anterior inter-axillary anastomosis, Rt inguinal nodes, Rt axillo-inguinal anastomosis, then focused on Rt UE working from proximal to distal then retracing all steps. P/ROM to Rt shoulder into flex, abd and D2 with scapular depression by therapist throughout STM to Rt lateral trunk inferior to axilla where pt palpably tight Compression Bandaging: Cocoa butter to Rt UE, stockinette, Molelast to fingers 1-4, Artiflex from hand to axilla, then short stretch compression bandages as follows: 6 cm to hand/wrist, 10 cm spiral from wrist with X at elbow, and 10 cm spiral from wrist to axilla.   11/29/23: Self  Care Spent time at beginning of session answering pts questions about MLD and compression bandaging. Then educated her about the active vs maintenance phases of treatment and our expectations for her for CDT. Decided to begin teaching pt self MLD today and then will focus on compression bandaging next session when pt reports her mom can come to also learn. Then throughout MLD sequence today had pt return demo of each step and offered hand over hand cuing and VC's for correct pressure and skin stretch. She was able to return excellent demo of both by end of session. Handout issued. At end of session pt wsa instructed in remedial exercises to perform with bandages on and to perform these if she feels any tingling or pain, if symptoms persist to remove bandages and bring all back.  Manual Therapy MLD to Rt UE: Short neck, 5 diaphragmatic breaths, Lt axillary and pectoral nodes, anterior intact thorax sequence, anterior inter-axillary anastomosis, Rt inguinal nodes, Rt axillo-inguinal anastomosis, then focused on Rt UE working from proximal to distal then retracing all steps having pt return demo of each step per handout issued today. Compression Bandaging: Cocoa butter to Rt UE, stockinette, Molelast to fingers 1-4, Artiflex from hand to axilla, then short stretch compression bandages as follows: 6 cm to hand/wrist, 10 cm spiral from wrist with X at elbow, and  10 cm spiral from wrist to axilla.   11/21/23: Measured pt for compression sleeve and glove today and sent info for pt to obtain through [employer]. Pt fit in to M2 Sigvaris secure sleeve and small glove in black.     PATIENT EDUCATION:  Education details:  Self compression bandaging Person educated: Patient and daughter Education method: Explanation, demonstration, tactile and VC's, handout issued Education comprehension: verbalized understanding, returned demo, tactile and VC's and will benefit from further review  HOME EXERCISE PROGRAM: 11/29/23:  Self MLD 12/06/23: Self compression bandaging  ASSESSMENT:  CLINICAL IMPRESSION: Reviewed self MLD which pt is doing very well.  Pt doesn't feel like she can self bandage so we didn't practice this today.  But she reports her daughter will be able to.  Encouraged her to check tracking on sleeve.   OBJECTIVE IMPAIRMENTS: decreased knowledge of condition, decreased knowledge of use of DME, increased edema, and pain.   ACTIVITY LIMITATIONS: carrying and lifting  PARTICIPATION LIMITATIONS: occupation  PERSONAL FACTORS: none are also affecting patient's functional outcome.   REHAB POTENTIAL: Good  CLINICAL DECISION MAKING: Stable/uncomplicated  EVALUATION COMPLEXITY: Low  GOALS: Goals reviewed with patient? Yes  SHORT TERM GOALS=LONG TERM GOALS Target date: 12/19/23  Pt will be able to independently bandage her R UE for long term management of lymphedema.  Baseline: Goal status: INITIAL  2.  Pt will be independent in self MLD for long term management of lymphedema.  Baseline:  Goal status: INITIAL  3.  Pt will obtain compression garments for long term management of lymphedema.  Baseline:  Goal status: INITIAL  4. Pt will be able to verbalize lymphedema risk reduction practices   Goal status: INITIAL    PLAN:  PT FREQUENCY: 2x/week  PT DURATION: 4 weeks  PLANNED INTERVENTIONS: 97164- PT Re-evaluation, 97110-Therapeutic exercises, 97530- Therapeutic activity, 97535- Self Care, 02859- Manual therapy, 97760- Orthotic Initial, [phone]- Orthotic/Prosthetic subsequent, Patient/Family education, Balance training, Joint mobilization, Therapeutic exercises, Therapeutic activity, Neuromuscular re-education, Gait training, and Self Care  PLAN FOR NEXT SESSION: Review self bandaging and review self MLD, assess garments for fit once they arrive (sent link to obtain through [employer])  Larue Saddie SAUNDERS, PT 12/11/2023, 9:57 AM

## 2023-12-13 ENCOUNTER — Ambulatory Visit: Admitting: Physical Therapy

## 2023-12-13 ENCOUNTER — Encounter: Payer: Self-pay | Admitting: Physical Therapy

## 2023-12-13 DIAGNOSIS — I972 Postmastectomy lymphedema syndrome: Secondary | ICD-10-CM

## 2023-12-13 DIAGNOSIS — Z170 Estrogen receptor positive status [ER+]: Secondary | ICD-10-CM

## 2023-12-13 NOTE — Therapy (Signed)
 OUTPATIENT PHYSICAL THERAPY  UPPER EXTREMITY ONCOLOGY TREATMENT  Patient Name: Miranda Ayers MRN: [id number] DOB:Apr 04, 1986, 38 y.o., female Today's Date: 12/13/2023  END OF SESSION:  PT End of Session - 12/13/23 0911     Visit Number 6    Number of Visits 9    Date for PT Re-Evaluation 12/19/23    PT Start Time 0910   pt arrived late   PT Stop Time 0957    PT Time Calculation (min) 47 min    Activity Tolerance Patient tolerated treatment well    Behavior During Therapy [facility] for tasks assessed/performed          Past Medical History:  Diagnosis Date   BRCA negative 10/2022   MyRisk neg   Breast cancer (HCC)    Cancer (HCC)    breast cancer   CHF (congestive heart failure) (HCC)    Family history of colon cancer    Family history of lung cancer    Family history of pancreatic cancer    Hypertension    Past Surgical History:  Procedure Laterality Date   MASTECTOMY     MASTECTOMY WITH RADIOACTIVE SEED GUIDED EXCISION AND AXILLARY SENTINEL LYMPH NODE BIOPSY Right 11/20/2018   Procedure: RIGHT SIMPLE MASTECTOMY WITH RIGHT RADIOACTIVE SEED TARGETED LYMPH NODE BIOPSY AND RIGHT  AXILLARY SENTINEL LYMPH NODE MAPPING AND PORT REMOVEL RIGHT CHEST;  Surgeon: [name] [name], MD;  Location: [facility];  Service: General;  Laterality: Right;   OOPHORECTOMY  2013   PORTACATH PLACEMENT N/A 05/08/2018   Procedure: INSERTION PORT-A-CATH WITH ULTRASOUND;  Surgeon: [name] [name], MD;  Location: [facility];  Service: General;  Laterality: N/A;   Patient Active Problem List   Diagnosis Date Noted   Neutropenia, drug-induced (HCC) 08/20/2018   Genetic testing 06/22/2018   Family history of lung cancer    Family history of colon cancer    Family history of pancreatic cancer    Malignant neoplasm of upper-inner quadrant of right breast in female, estrogen receptor positive (HCC) 04/19/2018    PCP: Almarie Jaeger, DO  REFERRING PROVIDER: Morna Crawford Pickle, NP  REFERRING DIAG: M79.89 (ICD-10-CM) - Arm swelling C50.211,Z17.0 (ICD-10-CM) - Malignant neoplasm of upper-inner quadrant of right breast in female, estrogen receptor positive (HCC) R22.1 (ICD-10-CM) - Neck swelling  THERAPY DIAG:  Postmastectomy lymphedema  Malignant neoplasm of upper-inner quadrant of right breast in female, estrogen receptor positive (HCC)  ONSET DATE: 11/11/23  Rationale for Evaluation and Treatment: Rehabilitation  SUBJECTIVE:  SUBJECTIVE STATEMENT: The sleeve was shipped and they said it was an insufficient address and now it is in [place] .   PERTINENT HISTORY: 11/20/18- right mastectomy for treatment of right breast cancer as well as SLNB (2 of 4 nodes positive), pt completed chemo and radiation. Had negative doppler and CT did not show any issues.   PAIN:  Are you having pain? No  PRECAUTIONS: Other: R UE lymphedema  RED FLAGS: None   WEIGHT BEARING RESTRICTIONS: No  FALLS:  Has patient fallen in last 6 months? 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  COGNITION: Overall cognitive status: Within functional limits for tasks assessed   PALPATION: Fullness palpable at dorsal surface of hand  OBSERVATIONS / OTHER ASSESSMENTS: R hand, wrist and forearm visibly swollen compared to L side  POSTURE: forward head, rounded  shoulders  UPPER EXTREMITY AROM/PROM: WFL   LYMPHEDEMA ASSESSMENTS:   LANDMARK RIGHT  eval Right 12/06/23  At axilla  33.5 32.7  15 cm proximal to olecranon process 32.7 32.3  10 cm proximal to olecranon process 30 29.4  Olecranon process 26 25.4  15 cm proximal to ulnar styloid process 26.5 25.8  10 cm proximal to ulnar styloid process 22.2 22.7  Just proximal to ulnar styloid process 17.4 16.5  Across hand at thumb web space 20 19.7  At base of 2nd digit 6.2 6  (Blank rows = not tested)  LANDMARK LEFT  eval  At axilla  34  15 cm proximal to olecranon process 34  10 cm proximal to olecranon process 30.2  Olecranon process 25.4  15 cm proximal to ulnar styloid process 24.8  10 cm proximal to ulnar styloid process 21.2  Just proximal to ulnar styloid process 16  Across hand at thumb web space 18.2  At base of 2nd digit 5.8  (Blank rows = not tested)                                                                                                                             TREATMENT DATE:  12/13/23: Continued MLD to the Rt UE: Short neck, 5 diaphragmatic breaths, Lt axillary nodes,  anterior inter-axillary anastomosis, Rt inguinal nodes, Rt axillo-inguinal anastomosis, then focused on Rt UE working from proximal to distal then retracing all steps. Pt reports she feels independent in self MLD.  Compression Bandaging: Cocoa butter to Rt UE, stockinette, Molelast to fingers 1-4, Artiflex from hand to axilla, then short stretch compression bandages as follows: 6 cm to hand/wrist, 10 cm herringbone from wrist with X at elbow, and 10  cm spiral from wrist to axilla.   12/11/23: Reviewed self MLD for the Rt UE: Short neck, 5 diaphragmatic breaths, Lt axillary and pectoral nodes, anterior intact thorax sequence, anterior inter-axillary anastomosis, Rt inguinal nodes, Rt axillo-inguinal anastomosis, then focused on Rt UE working from proximal to distal then retracing all steps. Pt did  very well without cueing needed except for location of clavicular circles.   Compression Bandaging: Cocoa butter to Rt UE, stockinette, Molelast to fingers 1-4, Artiflex from hand to axilla, then short stretch compression bandages as follows: 6 cm to hand/wrist, 10 cm spiral from wrist with X at elbow, and 10 cm spiral from wrist to axilla.    12/04/23: Manual Therapy MLD to Rt UE: Short neck, 5 diaphragmatic breaths, Lt axillary and pectoral nodes, anterior intact thorax sequence, anterior inter-axillary anastomosis, Rt inguinal nodes, Rt axillo-inguinal anastomosis, then focused on Rt UE working from proximal to distal then retracing all steps. P/ROM to Rt shoulder into flex, abd and D2 with scapular depression by therapist throughout STM to Rt lateral trunk inferior to axilla where pt palpably tight Compression Bandaging: Cocoa butter to Rt UE, stockinette, Molelast to fingers 1-4, Artiflex from hand to axilla, then short stretch compression bandages as follows: 6 cm to hand/wrist, 10 cm spiral from wrist with X at elbow, and 10 cm spiral from wrist to axilla.   12/06/23: Spent session instructing pt and daughter in following: Compression Bandaging: Cocoa butter to Rt UE, stockinette, Molelast to fingers 1-4, Artiflex from hand to axilla, then short stretch compression bandages as follows: 6 cm to hand/wrist, 10 cm spiral from wrist with X at elbow, and 10 cm spiral from wrist to axilla.  Daughter videorecorded while therapist applied first bandage, removed and then had daughter apply bandages with therapist offering tactile and VC's throughout. Then therapist applied bandage one final time.   12/04/23: Manual Therapy MLD to Rt UE: Short neck, 5 diaphragmatic breaths, Lt axillary and pectoral nodes, anterior intact thorax sequence, anterior inter-axillary anastomosis, Rt inguinal nodes, Rt axillo-inguinal anastomosis, then focused on Rt UE working from proximal to distal then retracing all  steps. P/ROM to Rt shoulder into flex, abd and D2 with scapular depression by therapist throughout STM to Rt lateral trunk inferior to axilla where pt palpably tight Compression Bandaging: Cocoa butter to Rt UE, stockinette, Molelast to fingers 1-4, Artiflex from hand to axilla, then short stretch compression bandages as follows: 6 cm to hand/wrist, 10 cm spiral from wrist with X at elbow, and 10 cm spiral from wrist to axilla.   11/29/23: Self Care Spent time at beginning of session answering pts questions about MLD and compression bandaging. Then educated her about the active vs maintenance phases of treatment and our expectations for her for CDT. Decided to begin teaching pt self MLD today and then will focus on compression bandaging next session when pt reports her mom can come to also learn. Then throughout MLD sequence today had pt return demo of each step and offered hand over hand cuing and VC's for correct pressure and skin stretch. She was able to return excellent demo of both by end of session. Handout issued. At end of session pt wsa instructed in remedial exercises to perform with bandages on and to perform these if she feels any tingling or pain, if symptoms persist to remove bandages and bring all back.  Manual Therapy MLD to Rt UE: Short neck, 5 diaphragmatic breaths, Lt axillary and pectoral nodes, anterior intact thorax sequence, anterior  inter-axillary anastomosis, Rt inguinal nodes, Rt axillo-inguinal anastomosis, then focused on Rt UE working from proximal to distal then retracing all steps having pt return demo of each step per handout issued today. Compression Bandaging: Cocoa butter to Rt UE, stockinette, Molelast to fingers 1-4, Artiflex from hand to axilla, then short stretch compression bandages as follows: 6 cm to hand/wrist, 10 cm spiral from wrist with X at elbow, and 10 cm spiral from wrist to axilla.   11/21/23: Measured pt for compression sleeve and glove today and sent  info for pt to obtain through [employer]. Pt fit in to M2 Sigvaris secure sleeve and small glove in black.     PATIENT EDUCATION:  Education details:  Self compression bandaging Person educated: Patient and daughter Education method: Explanation, demonstration, tactile and VC's, handout issued Education comprehension: verbalized understanding, returned demo, tactile and VC's and will benefit from further review  HOME EXERCISE PROGRAM: 11/29/23: Self MLD 12/06/23: Self compression bandaging  ASSESSMENT:  CLINICAL IMPRESSION: Continued MLD to RUE today. Pt was able to find where her sleeve was shipped on the 16th and was supposed to be delivered but tracking info said insufficient delivery address and now the sleeve is in [place] . Educated pt to call [employer] customer support to remedy the situation. Will decrease to 1x/wk for another 4 weeks to assess sleeve and glove once they arrive.  OBJECTIVE IMPAIRMENTS: decreased knowledge of condition, decreased knowledge of use of DME, increased edema, and pain.   ACTIVITY LIMITATIONS: carrying and lifting  PARTICIPATION LIMITATIONS: occupation  PERSONAL FACTORS: none are also affecting patient's functional outcome.   REHAB POTENTIAL: Good  CLINICAL DECISION MAKING: Stable/uncomplicated  EVALUATION COMPLEXITY: Low  GOALS: Goals reviewed with patient? Yes  SHORT TERM GOALS=LONG TERM GOALS Target date: 12/19/23  Pt will be able to independently bandage her R UE for long term management of lymphedema.  Baseline: Goal status: INITIAL  2.  Pt will be independent in self MLD for long term management of lymphedema.  Baseline:  Goal status: INITIAL  3.  Pt will obtain compression garments for long term management of lymphedema.  Baseline:  Goal status: INITIAL  4. Pt will be able to verbalize lymphedema risk reduction practices   Goal status: INITIAL    PLAN:  PT FREQUENCY: 2x/week  PT DURATION: 4 weeks  PLANNED INTERVENTIONS:  97164- PT Re-evaluation, 97110-Therapeutic exercises, 97530- Therapeutic activity, 97535- Self Care, 02859- Manual therapy, 97760- Orthotic Initial, [phone]- Orthotic/Prosthetic subsequent, Patient/Family education, Balance training, Joint mobilization, Therapeutic exercises, Therapeutic activity, Neuromuscular re-education, Gait training, and Self Care  PLAN FOR NEXT SESSION: Update POC on 7/1 - decrease to 1x/wk, Review self bandaging and review self MLD, assess garments for fit once they arrive (sent link to obtain through [employer])  [employer], PT 12/13/2023, 10:01 AM

## 2023-12-20 ENCOUNTER — Ambulatory Visit: Admitting: Physical Therapy

## 2023-12-27 ENCOUNTER — Ambulatory Visit: Attending: Adult Health

## 2023-12-27 DIAGNOSIS — I972 Postmastectomy lymphedema syndrome: Secondary | ICD-10-CM | POA: Diagnosis present

## 2023-12-27 DIAGNOSIS — C50211 Malignant neoplasm of upper-inner quadrant of right female breast: Secondary | ICD-10-CM | POA: Diagnosis present

## 2023-12-27 DIAGNOSIS — Z170 Estrogen receptor positive status [ER+]: Secondary | ICD-10-CM | POA: Insufficient documentation

## 2023-12-27 NOTE — Therapy (Signed)
 OUTPATIENT PHYSICAL THERAPY  UPPER EXTREMITY ONCOLOGY TREATMENT  Patient Name: Miranda Ayers MRN: [id number] DOB:05-Dec-1985, 38 y.o., female Today's Date: 12/27/2023  END OF SESSION:  PT End of Session - 12/27/23 0906     Visit Number 7    Number of Visits 13    Date for PT Re-Evaluation 01/24/24    PT Start Time 0904    PT Stop Time 1004    PT Time Calculation (min) 60 min    Activity Tolerance Patient tolerated treatment well    Behavior During Therapy [facility] for tasks assessed/performed          Past Medical History:  Diagnosis Date   BRCA negative 10/2022   MyRisk neg   Breast cancer (HCC)    Cancer (HCC)    breast cancer   CHF (congestive heart failure) (HCC)    Family history of colon cancer    Family history of lung cancer    Family history of pancreatic cancer    Hypertension    Past Surgical History:  Procedure Laterality Date   MASTECTOMY     MASTECTOMY WITH RADIOACTIVE SEED GUIDED EXCISION AND AXILLARY SENTINEL LYMPH NODE BIOPSY Right 11/20/2018   Procedure: RIGHT SIMPLE MASTECTOMY WITH RIGHT RADIOACTIVE SEED TARGETED LYMPH NODE BIOPSY AND RIGHT  AXILLARY SENTINEL LYMPH NODE MAPPING AND PORT REMOVEL RIGHT CHEST;  Surgeon: [name] [name], MD;  Location: [facility];  Service: General;  Laterality: Right;   OOPHORECTOMY  2013   PORTACATH PLACEMENT N/A 05/08/2018   Procedure: INSERTION PORT-A-CATH WITH ULTRASOUND;  Surgeon: [name] [name], MD;  Location: [facility];  Service: General;  Laterality: N/A;   Patient Active Problem List   Diagnosis Date Noted   Neutropenia, drug-induced (HCC) 08/20/2018   Genetic testing 06/22/2018   Family history of lung cancer    Family history of colon cancer    Family history of pancreatic cancer    Malignant neoplasm of upper-inner quadrant of right breast in female, estrogen receptor positive (HCC) 04/19/2018    PCP: Almarie Jaeger, DO  REFERRING PROVIDER: Morna Crawford Pickle,  NP  REFERRING DIAG: M79.89 (ICD-10-CM) - Arm swelling C50.211,Z17.0 (ICD-10-CM) - Malignant neoplasm of upper-inner quadrant of right breast in female, estrogen receptor positive (HCC) R22.1 (ICD-10-CM) - Neck swelling  THERAPY DIAG:  Postmastectomy lymphedema  Malignant neoplasm of upper-inner quadrant of right breast in female, estrogen receptor positive (HCC)  ONSET DATE: 11/11/23  Rationale for Evaluation and Treatment: Rehabilitation  SUBJECTIVE:  SUBJECTIVE STATEMENT:   PERTINENT HISTORY: 11/20/18- right mastectomy for treatment of right breast cancer as well as SLNB (2 of 4 nodes positive), pt completed chemo and radiation. Had negative doppler and CT did not show any issues.   PAIN:  Are you having pain? No  PRECAUTIONS: Other: R UE lymphedema  RED FLAGS: None   WEIGHT BEARING RESTRICTIONS: No  FALLS:  Has patient fallen in last 6 months? 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  COGNITION: Overall cognitive status: Within functional limits for tasks assessed   PALPATION: Fullness palpable at dorsal surface of hand  OBSERVATIONS / OTHER ASSESSMENTS: R hand, wrist and forearm visibly swollen compared to L side  POSTURE: forward head, rounded shoulders  UPPER EXTREMITY AROM/PROM: WFL   LYMPHEDEMA ASSESSMENTS:   LANDMARK RIGHT  eval Right 12/06/23  At axilla   33.5 32.7  15 cm proximal to olecranon process 32.7 32.3  10 cm proximal to olecranon process 30 29.4  Olecranon process 26 25.4  15 cm proximal to ulnar styloid process 26.5 25.8  10 cm proximal to ulnar styloid process 22.2 22.7  Just proximal to ulnar styloid process 17.4 16.5  Across hand at thumb web space 20 19.7  At base of 2nd digit 6.2 6  (Blank rows = not tested)  LANDMARK LEFT  eval  At axilla  34  15 cm proximal to olecranon process 34  10 cm proximal to olecranon process 30.2  Olecranon process 25.4  15 cm proximal to ulnar styloid process 24.8  10 cm proximal to ulnar styloid process 21.2  Just proximal to ulnar styloid process 16  Across hand at thumb web space 18.2  At base of 2nd digit 5.8  (Blank rows = not tested)                                                                                                                             TREATMENT DATE:  12/27/23: Self Care Discussed current functional status and assessed goals for renewal. Pt brought her new compression sleeve. Educated her how to don and doff this. It fits pt very well and she reports this feeling comfortable. She is now just awaiting the arrival of the glove.  Manual Therapy Continued MLD to the Rt UE: Short neck, 5 diaphragmatic breaths, Lt axillary nodes,  anterior inter-axillary anastomosis, Rt inguinal nodes, Rt axillo-inguinal anastomosis, then focused on Rt UE working from proximal to distal then retracing all steps. Pt reports she feels independent in self MLD.  P/ROM to Rt shoulder into flex, abd and  D2 with scapular depression by therapist throughout  MFR: attempted this at areas of tightness in Rt axilla but pt very ticklish and could not tolerate much so stopped Compression Bandaging: Cocoa butter to Rt UE, stockinette, Molelast to fingers 1-4, Artiflex from hand to axilla, then short stretch compression bandages as follows: 6 cm to hand/wrist, 10 cm herringbone from wrist with X at  elbow, and 10 cm spiral from wrist to axilla.   12/13/23: Continued MLD to the Rt UE: Short neck, 5 diaphragmatic breaths, Lt axillary nodes,  anterior inter-axillary anastomosis, Rt inguinal nodes, Rt axillo-inguinal anastomosis, then focused on Rt UE working from proximal to distal then retracing all steps. Pt reports she feels independent in self MLD.  Compression Bandaging: Cocoa butter to Rt UE, stockinette, Molelast to fingers 1-4, Artiflex from hand to axilla, then short stretch compression bandages as follows: 6 cm to hand/wrist, 10 cm herringbone from wrist with X at elbow, and 10 cm spiral from wrist to axilla.   12/11/23: Reviewed self MLD for the Rt UE: Short neck, 5 diaphragmatic breaths, Lt axillary and pectoral nodes, anterior intact thorax sequence, anterior inter-axillary anastomosis, Rt inguinal nodes, Rt axillo-inguinal anastomosis, then focused on Rt UE working from proximal to distal then retracing all steps. Pt did very well without cueing needed except for location of clavicular circles.   Compression Bandaging: Cocoa butter to Rt UE, stockinette, Molelast to fingers 1-4, Artiflex from hand to axilla, then short stretch compression bandages as follows: 6 cm to hand/wrist, 10 cm spiral from wrist with X at elbow, and 10 cm spiral from wrist to axilla.      PATIENT EDUCATION:  Education details:  Self compression bandaging Person educated: Patient and daughter Education method: Explanation, demonstration, tactile and VC's, handout issued Education comprehension: verbalized understanding, returned demo, tactile and VC's and will benefit from further review  HOME EXERCISE PROGRAM: 11/29/23: Self MLD 12/06/23: Self compression bandaging  ASSESSMENT:  CLINICAL IMPRESSION: Pt has received her sleeve and is awaiting arrival of her glove. Continued with CDT of Rt UE. Renewal done this session decreasing freq to 1x/wk for up to 4 weeks to monitor pt until compression glove  arrives. Then if also a good fit will issue info for pt to call [employer] [facility] to see if they can order more of what she has through her insurance.   OBJECTIVE IMPAIRMENTS: decreased knowledge of condition, decreased knowledge of use of DME, increased edema, and pain.   ACTIVITY LIMITATIONS: carrying and lifting  PARTICIPATION LIMITATIONS: occupation  PERSONAL FACTORS: none are also affecting patient's functional outcome.   REHAB POTENTIAL: Good  CLINICAL DECISION MAKING: Stable/uncomplicated  EVALUATION COMPLEXITY: Low  GOALS: Goals reviewed with patient? Yes  SHORT TERM GOALS=LONG TERM GOALS Target date: 01/24/24  Pt will be able to independently bandage her R UE for long term management of lymphedema.  Baseline: 12/27/23 - Pt and daughter are independent in this now Goal status: MET  2.  Pt will be independent in self MLD for long term management of lymphedema.  Baseline: 12/27/23 - Pt is independent with this Goal status: MET  3.  Pt will obtain compression garments for long term management of lymphedema.  Baseline: 12/27/23 - pt has received her compression sleeve and this fits very well, her glove as yet to arrive Goal status: PARTIALLY MET  4. Pt will be able to verbalize lymphedema risk reduction practices   Goal status: MET    PLAN:  PT FREQUENCY: 2x/week  PT  DURATION: 4 weeks  PLANNED INTERVENTIONS: 97164- PT Re-evaluation, 97110-Therapeutic exercises, 97530- Therapeutic activity, 97535- Self Care, 02859- Manual therapy, [phone]- Orthotic Initial, [phone]- Orthotic/Prosthetic subsequent, Patient/Family education, Balance training, Joint mobilization, Therapeutic exercises, Therapeutic activity, Neuromuscular re-education, Gait training, and Self Care  PLAN FOR NEXT SESSION: Renewal this session for 1x/wk x 4 weeks - decrease to 1x/wk, Review self bandaging and review self MLD prn, assess glove for fit once it arrives. If all fits good issue script from Dr. Gudena for  compression sleeve and glove [employer] and [employer] [facility] phone # to see if ore can be order through her insurance. Then D/C  Aden Berwyn Caldron, PTA 12/27/2023, 10:13 AM

## 2024-01-03 ENCOUNTER — Ambulatory Visit

## 2024-01-03 DIAGNOSIS — I972 Postmastectomy lymphedema syndrome: Secondary | ICD-10-CM

## 2024-01-03 DIAGNOSIS — Z170 Estrogen receptor positive status [ER+]: Secondary | ICD-10-CM

## 2024-01-03 NOTE — Therapy (Signed)
 OUTPATIENT PHYSICAL THERAPY  UPPER EXTREMITY ONCOLOGY TREATMENT  Patient Name: Miranda Ayers MRN: [id number] DOB:Jun 07, 1986, 38 y.o., female Today's Date: 01/03/2024  END OF SESSION:  PT End of Session - 01/03/24 0933     Visit Number 8    Number of Visits 13    Date for PT Re-Evaluation 01/24/24    PT Start Time 0922   pt arrived late   PT Stop Time 1001    PT Time Calculation (min) 39 min    Activity Tolerance Patient tolerated treatment well    Behavior During Therapy [facility] for tasks assessed/performed          Past Medical History:  Diagnosis Date   BRCA negative 10/2022   MyRisk neg   Breast cancer (HCC)    Cancer (HCC)    breast cancer   CHF (congestive heart failure) (HCC)    Family history of colon cancer    Family history of lung cancer    Family history of pancreatic cancer    Hypertension    Past Surgical History:  Procedure Laterality Date   MASTECTOMY     MASTECTOMY WITH RADIOACTIVE SEED GUIDED EXCISION AND AXILLARY SENTINEL LYMPH NODE BIOPSY Right 11/20/2018   Procedure: RIGHT SIMPLE MASTECTOMY WITH RIGHT RADIOACTIVE SEED TARGETED LYMPH NODE BIOPSY AND RIGHT  AXILLARY SENTINEL LYMPH NODE MAPPING AND PORT REMOVEL RIGHT CHEST;  Surgeon: [name] [name], MD;  Location: [facility];  Service: General;  Laterality: Right;   OOPHORECTOMY  2013   PORTACATH PLACEMENT N/A 05/08/2018   Procedure: INSERTION PORT-A-CATH WITH ULTRASOUND;  Surgeon: [name] [name], MD;  Location: [facility];  Service: General;  Laterality: N/A;   Patient Active Problem List   Diagnosis Date Noted   Neutropenia, drug-induced (HCC) 08/20/2018   Genetic testing 06/22/2018   Family history of lung cancer    Family history of colon cancer    Family history of pancreatic cancer    Malignant neoplasm of upper-inner quadrant of right breast in female, estrogen receptor positive (HCC) 04/19/2018    PCP: Almarie Jaeger, DO  REFERRING PROVIDER: Morna Crawford Pickle, NP  REFERRING DIAG: M79.89 (ICD-10-CM) - Arm swelling C50.211,Z17.0 (ICD-10-CM) - Malignant neoplasm of upper-inner quadrant of right breast in female, estrogen receptor positive (HCC) R22.1 (ICD-10-CM) - Neck swelling  THERAPY DIAG:  Postmastectomy lymphedema  Malignant neoplasm of upper-inner quadrant of right breast in female, estrogen receptor positive (HCC)  ONSET DATE: 11/11/23  Rationale for Evaluation and Treatment: Rehabilitation  SUBJECTIVE:  SUBJECTIVE STATEMENT: I still haven't got the sleeve but haven't looked up tracking yet either.   PERTINENT HISTORY: 11/20/18- right mastectomy for treatment of right breast cancer as well as SLNB (2 of 4 nodes positive), pt completed chemo and radiation. Had negative doppler and CT did not show any issues.   PAIN:  Are you having pain? No  PRECAUTIONS: Other: R UE lymphedema  RED FLAGS: None   WEIGHT BEARING RESTRICTIONS: No  FALLS:  Has patient fallen in last 6 months? 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  COGNITION: Overall cognitive status: Within functional limits for tasks assessed   PALPATION: Fullness palpable at dorsal surface of hand  OBSERVATIONS / OTHER ASSESSMENTS: R hand, wrist and forearm visibly swollen compared to L side  POSTURE: forward head, rounded shoulders  UPPER EXTREMITY  AROM/PROM: WFL   LYMPHEDEMA ASSESSMENTS:   LANDMARK RIGHT  eval Right 12/06/23 Right 01/03/24  At axilla  33.5 32.7 32.7  15 cm proximal to olecranon process 32.7 32.3 31.3  10 cm proximal to olecranon process 30 29.4 28.4  Olecranon process 26 25.4 24.5  15 cm proximal to ulnar styloid process 26.5 25.8 25.6  10 cm proximal to ulnar styloid process 22.2 22.7 22.8  Just proximal to ulnar styloid process 17.4 16.5 16.3  Across hand at thumb web space 20 19.7 18.6  At base of 2nd digit 6.2 6 5.9  (Blank rows = not tested)  LANDMARK LEFT  eval  At axilla  34  15 cm proximal to olecranon process 34  10 cm proximal to olecranon process 30.2  Olecranon process 25.4  15 cm proximal to ulnar styloid process 24.8  10 cm proximal to ulnar styloid process 21.2  Just proximal to ulnar styloid process 16  Across hand at thumb web space 18.2  At base of 2nd digit 5.8  (Blank rows = not tested)                                                                                                                             TREATMENT DATE:  01/03/24: Self Care At beginning of session helped pt to look up tracking for her sleeve. There was an issue with her address in their system and this got figured out for the sleeve and it has 2 tracking numbers, but the glove only has one. So instructed pt to call [employer] today to see if they can help her start tracking the glove. Pt verbalized understanding. Manual Therapy Continued MLD to the Rt UE: Short neck, 5 diaphragmatic breaths,  Lt axillary and pectoral nodes, anterior intact thorax sequence, anterior inter-axillary anastomosis, Rt inguinal nodes, Rt axillo-inguinal anastomosis, then focused on Rt UE working from proximal to distal then retracing all steps.  Circumference measurements taken, good reductions noted Compression: Donned pts Sigvaris sleeve and then compression bandaging to her hand/fingers: Molelast to fingers 1-4, artiflex to  hand/wrist and then 1 - 6 cm short stretch compression bandage to hand/wrist. Pt was very pleased with this compromise until her glove arrives.   12/27/23: Self Care Discussed current functional status and assessed goals for renewal. Pt brought her new compression sleeve. Educated her how to don and doff this. It fits pt very well and she reports this feeling comfortable. She is now just awaiting the arrival of the glove.  Manual Therapy Continued MLD to the Rt UE: Short neck, 5 diaphragmatic breaths, Lt axillary nodes,  anterior inter-axillary anastomosis, Rt inguinal nodes, Rt axillo-inguinal anastomosis, then focused on Rt UE working from proximal to distal then retracing all steps. Pt reports she feels independent in self MLD.  P/ROM to Rt shoulder into flex, abd and D2 with scapular depression by therapist throughout  MFR: attempted this at areas of tightness in Rt axilla but pt very ticklish and could not tolerate much so stopped Compression Bandaging: Cocoa butter to Rt UE, stockinette, Molelast to fingers 1-4, Artiflex from hand to axilla, then short stretch compression bandages as follows: 6 cm to hand/wrist, 10 cm herringbone from wrist with X at elbow, and 10 cm spiral from wrist to axilla.   12/13/23: Continued MLD to the Rt UE: Short neck, 5 diaphragmatic breaths, Lt axillary nodes,  anterior inter-axillary anastomosis, Rt inguinal nodes, Rt axillo-inguinal anastomosis, then focused on Rt UE working from proximal to distal then retracing all steps. Pt reports she feels independent in self MLD.  Compression Bandaging: Cocoa butter to Rt UE, stockinette, Molelast to fingers 1-4, Artiflex from hand to axilla, then short stretch compression bandages as follows: 6 cm to hand/wrist, 10 cm herringbone from wrist with X at elbow, and 10 cm spiral from wrist to axilla.       PATIENT EDUCATION:  Education details:  Self compression bandaging Person educated: Patient and daughter Education  method: Explanation, demonstration, tactile and VC's, handout issued Education comprehension: verbalized understanding, returned demo, tactile and VC's and will benefit from further review  HOME EXERCISE PROGRAM: 11/29/23: Self MLD 12/06/23: Self compression bandaging  ASSESSMENT:  CLINICAL IMPRESSION: Pt will f/u with DME to see if they can find her glove. For today trial of hybrid compression with sleeve to UE and bandaging to hand. Good reductions noted with circumference measurements.   OBJECTIVE IMPAIRMENTS: decreased knowledge of condition, decreased knowledge of use of DME, increased edema, and pain.   ACTIVITY LIMITATIONS: carrying and lifting  PARTICIPATION LIMITATIONS: occupation  PERSONAL FACTORS: none are also affecting patient's functional outcome.   REHAB POTENTIAL: Good  CLINICAL DECISION MAKING: Stable/uncomplicated  EVALUATION COMPLEXITY: Low  GOALS: Goals reviewed with patient? Yes  SHORT TERM GOALS=LONG TERM GOALS Target date: 01/24/24  Pt will be able to independently bandage her R UE for long term management of lymphedema.  Baseline: 12/27/23 - Pt and daughter are independent in this now Goal status: MET  2.  Pt will be independent in self MLD for long term management of lymphedema.  Baseline: 12/27/23 - Pt is independent with this Goal status: MET  3.  Pt will obtain compression garments for long term management of lymphedema.  Baseline: 12/27/23 - pt  has received her compression sleeve and this fits very well, her glove as yet to arrive Goal status: PARTIALLY MET  4. Pt will be able to verbalize lymphedema risk reduction practices   Goal status: MET    PLAN:  PT FREQUENCY: 2x/week  PT DURATION: 4 weeks  PLANNED INTERVENTIONS: 97164- PT Re-evaluation, 97110-Therapeutic exercises, 97530- Therapeutic activity, 97535- Self Care, 02859- Manual therapy, 97760- Orthotic Initial, [phone]- Orthotic/Prosthetic subsequent, Patient/Family education, Balance  training, Joint mobilization, Therapeutic exercises, Therapeutic activity, Neuromuscular re-education, Gait training, and Self Care  PLAN FOR NEXT SESSION: Find out about glove? Re measure to assess tolerance of wearing compression sleeve with hand bandage. Review self bandaging and review self MLD prn, assess glove for fit once it arrives. If all fits good issue script from Dr. Gudena for compression sleeve and glove [employer] and [employer] [facility] phone # to see if ore can be order through her insurance. Then D/C  Aden Berwyn Caldron, PTA 01/03/2024, 10:23 AM

## 2024-01-10 ENCOUNTER — Ambulatory Visit

## 2024-01-10 DIAGNOSIS — I972 Postmastectomy lymphedema syndrome: Secondary | ICD-10-CM

## 2024-01-10 DIAGNOSIS — Z170 Estrogen receptor positive status [ER+]: Secondary | ICD-10-CM

## 2024-01-10 NOTE — Therapy (Signed)
 OUTPATIENT PHYSICAL THERAPY  UPPER EXTREMITY ONCOLOGY TREATMENT  Patient Name: Miranda Ayers MRN: [id number] DOB:[birth year], 38 y.o., female Today's Date: 01/10/2024  END OF SESSION:  PT End of Session - 01/10/24 1008     Visit Number 9    Number of Visits 13    Date for PT Re-Evaluation 01/24/24    PT Start Time 1004    PT Stop Time 1059    PT Time Calculation (min) 55 min    Activity Tolerance Patient tolerated treatment well    Behavior During Therapy [facility] for tasks assessed/performed          Past Medical History:  Diagnosis Date   BRCA negative 10/2022   MyRisk neg   Breast cancer (HCC)    Cancer (HCC)    breast cancer   CHF (congestive heart failure) (HCC)    Family history of colon cancer    Family history of lung cancer    Family history of pancreatic cancer    Hypertension    Past Surgical History:  Procedure Laterality Date   MASTECTOMY     MASTECTOMY WITH RADIOACTIVE SEED GUIDED EXCISION AND AXILLARY SENTINEL LYMPH NODE BIOPSY Right 11/20/2018   Procedure: RIGHT SIMPLE MASTECTOMY WITH RIGHT RADIOACTIVE SEED TARGETED LYMPH NODE BIOPSY AND RIGHT  AXILLARY SENTINEL LYMPH NODE MAPPING AND PORT REMOVEL RIGHT CHEST;  Surgeon: [name] [name], MD;  Location: [facility];  Service: General;  Laterality: Right;   OOPHORECTOMY  2013   PORTACATH PLACEMENT N/A 05/08/2018   Procedure: INSERTION PORT-A-CATH WITH ULTRASOUND;  Surgeon: [name] [name], MD;  Location: [facility];  Service: General;  Laterality: N/A;   Patient Active Problem List   Diagnosis Date Noted   Neutropenia, drug-induced (HCC) 08/20/2018   Genetic testing 06/22/2018   Family history of lung cancer    Family history of colon cancer    Family history of pancreatic cancer    Malignant neoplasm of upper-inner quadrant of right breast in female, estrogen receptor positive (HCC) 04/19/2018    PCP: Almarie Jaeger, DO  REFERRING PROVIDER: Morna Crawford Pickle,  NP  REFERRING DIAG: M79.89 (ICD-10-CM) - Arm swelling C50.211,Z17.0 (ICD-10-CM) - Malignant neoplasm of upper-inner quadrant of right breast in female, estrogen receptor positive (HCC) R22.1 (ICD-10-CM) - Neck swelling  THERAPY DIAG:  Postmastectomy lymphedema  Malignant neoplasm of upper-inner quadrant of right breast in female, estrogen receptor positive (HCC)  ONSET DATE: 11/11/23  Rationale for Evaluation and Treatment: Rehabilitation  SUBJECTIVE:  SUBJECTIVE STATEMENT: My glove came Saturday! I think it fits well but my fingers are just so short.   PERTINENT HISTORY: 11/20/18- right mastectomy for treatment of right breast cancer as well as SLNB (2 of 4 nodes positive), pt completed chemo and radiation. Had negative doppler and CT did not show any issues.   PAIN:  Are you having pain? No  PRECAUTIONS: Other: R UE lymphedema  RED FLAGS: None   WEIGHT BEARING RESTRICTIONS: No  FALLS:  Has patient fallen in last 6 months? 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  COGNITION: Overall cognitive status: Within functional limits for tasks assessed   PALPATION: Fullness palpable at dorsal surface of hand  OBSERVATIONS / OTHER ASSESSMENTS: R hand, wrist and forearm visibly swollen compared to L side  POSTURE: forward head, rounded shoulders  UPPER EXTREMITY AROM/PROM:  WFL   LYMPHEDEMA ASSESSMENTS:   LANDMARK RIGHT  eval Right 12/06/23 Right 01/03/24 Right 01/10/24  At axilla  33.5 32.7 32.7 32.7  15 cm proximal to olecranon process 32.7 32.3 31.3 31.8  10 cm proximal to olecranon process 30 29.4 28.4 28.7  Olecranon process 26 25.4 24.5 25.1  15 cm proximal to ulnar styloid process 26.5 25.8 25.6 26  10  cm proximal to ulnar styloid process 22.2 22.7 22.8 23.2  Just proximal to ulnar styloid process 17.4 16.5 16.3 16.6  Across hand at thumb web space 20 19.7 18.6 19.4  At base of 2nd digit 6.2 6 5.9 5.9  (Blank rows = not tested)  LANDMARK LEFT  eval  At axilla  34  15 cm proximal to olecranon process 34  10 cm proximal to olecranon process 30.2  Olecranon process 25.4  15 cm proximal to ulnar styloid process 24.8  10 cm proximal to ulnar styloid process 21.2  Just proximal to ulnar styloid process 16  Across hand at thumb web space 18.2  At base of 2nd digit 5.8  (Blank rows = not tested)                                                                                                                             TREATMENT DATE:  01/10/24: Orthotic Fit Initial Pt brought new compression sleeve and glove. Assessed fit of both and educated pt about how a rubber covered glove, like a gardening glove, can help adjust sleeve and glove, especially fingers of glove, once on. Pt able to verbalize understanding and return therapist demo.  Therapeutic Exercise Pulleys into flex and abd x 2 mins each with VC's to decrease Rt scap  compensation Roll yellow ball up wall into flex x 10 returning therapist demo  Supine over half foam roll for bil UE scaption into a V x10, and then bil UE abd snow angle x10, 5 sec holds Manual Therapy P/ROM to Rt shoulder into flex, abd and D2 with scapular depression throughout STM to scar tissue in Rt axilla, this has softened some since start of care but still very tight and limiting to end ROM  01/03/24: Self  Care At beginning of session helped pt to look up tracking for her sleeve. There was an issue with her address in their system and this got figured out for the sleeve and it has 2 tracking numbers, but the glove only has one. So instructed pt to call [employer] today to see if they can help her start tracking the glove. Pt verbalized understanding. Manual Therapy Continued MLD to the Rt UE: Short neck, 5 diaphragmatic breaths, Lt axillary and pectoral nodes, anterior intact thorax sequence, anterior inter-axillary anastomosis, Rt inguinal nodes, Rt axillo-inguinal anastomosis, then focused on Rt UE working from proximal to distal then retracing all steps.  Circumference measurements taken, good reductions noted Compression: Donned pts Sigvaris sleeve and then compression bandaging to her hand/fingers: Molelast to fingers 1-4, artiflex to hand/wrist and then 1 - 6 cm short stretch compression bandage to hand/wrist. Pt was very pleased with this compromise until her glove arrives.   12/27/23: Self Care Discussed current functional status and assessed goals for renewal. Pt brought her new compression sleeve. Educated her how to don and doff this. It fits pt very well and she reports this feeling comfortable. She is now just awaiting the arrival of the glove.  Manual Therapy Continued MLD to the Rt UE: Short neck, 5 diaphragmatic breaths, Lt axillary nodes,  anterior inter-axillary anastomosis, Rt inguinal nodes, Rt axillo-inguinal anastomosis, then focused on Rt UE working from proximal to distal then retracing all steps. Pt reports she feels independent in self MLD.  P/ROM to Rt shoulder into flex, abd and D2 with scapular depression by therapist throughout  MFR: attempted this at areas of tightness in Rt axilla but pt very ticklish and could not tolerate much so stopped Compression Bandaging: Cocoa butter to Rt UE, stockinette, Molelast to fingers 1-4, Artiflex from hand to axilla, then short  stretch compression bandages as follows: 6 cm to hand/wrist, 10 cm herringbone from wrist with X at elbow, and 10 cm spiral from wrist to axilla.   12/13/23: Continued MLD to the Rt UE: Short neck, 5 diaphragmatic breaths, Lt axillary nodes,  anterior inter-axillary anastomosis, Rt inguinal nodes, Rt axillo-inguinal anastomosis, then focused on Rt UE working from proximal to distal then retracing all steps. Pt reports she feels independent in self MLD.  Compression Bandaging: Cocoa butter to Rt UE, stockinette, Molelast to fingers 1-4, Artiflex from hand to axilla, then short stretch compression bandages as follows: 6 cm to hand/wrist, 10 cm herringbone from wrist with X at elbow, and 10 cm spiral from wrist to axilla.       PATIENT EDUCATION:  Education details:  Self compression bandaging Person educated: Patient and daughter Education method: Explanation, demonstration, tactile and VC's, handout issued Education comprehension: verbalized understanding, returned demo, tactile and VC's and will benefit from further review  HOME EXERCISE PROGRAM: 11/29/23: Self MLD 12/06/23: Self compression bandaging  ASSESSMENT:  CLINICAL IMPRESSION: Pt brought new garments so assessed fit of both which is good except pinky of glove is a bit long so was  able to fold this in half over finger as it was also a little loose. Then focused more on Rt shoulder AA/A/P/ROM of Rt shoulder. Pt to return for final assess in a few weeks to re measure circumference and if garments are good assist pt with ordering more.   OBJECTIVE IMPAIRMENTS: decreased knowledge of condition, decreased knowledge of use of DME, increased edema, and pain.   ACTIVITY LIMITATIONS: carrying and lifting  PARTICIPATION LIMITATIONS: occupation  PERSONAL FACTORS: none are also affecting patient's functional outcome.   REHAB POTENTIAL: Good  CLINICAL DECISION MAKING: Stable/uncomplicated  EVALUATION COMPLEXITY: Low  GOALS: Goals  reviewed with patient? Yes  SHORT TERM GOALS=LONG TERM GOALS Target date: 01/24/24  Pt will be able to independently bandage her R UE for long term management of lymphedema.  Baseline: 12/27/23 - Pt and daughter are independent in this now Goal status: MET  2.  Pt will be independent in self MLD for long term management of lymphedema.  Baseline: 12/27/23 - Pt is independent with this Goal status: MET  3.  Pt will obtain compression garments for long term management of lymphedema.  Baseline: 12/27/23 - pt has received her compression sleeve and this fits very well, her glove as yet to arrive Goal status: PARTIALLY MET  4. Pt will be able to verbalize lymphedema risk reduction practices   Goal status: MET    PLAN:  PT FREQUENCY: 2x/week  PT DURATION: 4 weeks  PLANNED INTERVENTIONS: 97164- PT Re-evaluation, 97110-Therapeutic exercises, 97530- Therapeutic activity, 97535- Self Care, [id number]- Manual therapy, 97760- Orthotic Initial, [phone]- Orthotic/Prosthetic subsequent, Patient/Family education, Balance training, Joint mobilization, Therapeutic exercises, Therapeutic activity, Neuromuscular re-education, Gait training, and Self Care  PLAN FOR NEXT SESSION: Re measure to assess tolerance of wearing compression sleeve with hand bandage. If all fits good issue script from Dr. Gudena for compression sleeve and glove [employer] phone # to see if ore can be order through her insurance. Then D/C  Aden Berwyn Caldron, PTA 01/10/2024, 11:06 AM

## 2024-01-11 ENCOUNTER — Encounter: Payer: Self-pay | Admitting: Genetic Counselor

## 2024-01-31 ENCOUNTER — Ambulatory Visit: Payer: Self-pay | Attending: Adult Health

## 2024-01-31 DIAGNOSIS — C50211 Malignant neoplasm of upper-inner quadrant of right female breast: Secondary | ICD-10-CM | POA: Diagnosis present

## 2024-01-31 DIAGNOSIS — Z170 Estrogen receptor positive status [ER+]: Secondary | ICD-10-CM | POA: Insufficient documentation

## 2024-01-31 DIAGNOSIS — I972 Postmastectomy lymphedema syndrome: Secondary | ICD-10-CM | POA: Insufficient documentation

## 2024-01-31 NOTE — Patient Instructions (Addendum)
 SABRA

## 2024-01-31 NOTE — Therapy (Addendum)
 OUTPATIENT PHYSICAL THERAPY  UPPER EXTREMITY ONCOLOGY TREATMENT  Patient Name: Miranda Ayers MRN: [id number] DOB:1985/11/07, 38 y.o., female Today's Date: 01/31/2024  END OF SESSION:  PT End of Session - 01/31/24 1009     Visit Number 10    Number of Visits 13    Date for PT Re-Evaluation 01/24/24   D/C this visit   PT Start Time 1007    PT Stop Time 1101    PT Time Calculation (min) 54 min    Activity Tolerance Patient tolerated treatment well    Behavior During Therapy [facility] for tasks assessed/performed          Past Medical History:  Diagnosis Date   BRCA negative 10/2022   MyRisk neg   Breast cancer (HCC)    Cancer (HCC)    breast cancer   CHF (congestive heart failure) (HCC)    Family history of colon cancer    Family history of lung cancer    Family history of pancreatic cancer    Hypertension    Past Surgical History:  Procedure Laterality Date   MASTECTOMY     MASTECTOMY WITH RADIOACTIVE SEED GUIDED EXCISION AND AXILLARY SENTINEL LYMPH NODE BIOPSY Right 11/20/2018   Procedure: RIGHT SIMPLE MASTECTOMY WITH RIGHT RADIOACTIVE SEED TARGETED LYMPH NODE BIOPSY AND RIGHT  AXILLARY SENTINEL LYMPH NODE MAPPING AND PORT REMOVEL RIGHT CHEST;  Surgeon: [name] [name], MD;  Location: [facility];  Service: General;  Laterality: Right;   OOPHORECTOMY  2013   PORTACATH PLACEMENT N/A 05/08/2018   Procedure: INSERTION PORT-A-CATH WITH ULTRASOUND;  Surgeon: [name] [name], MD;  Location: [facility];  Service: General;  Laterality: N/A;   Patient Active Problem List   Diagnosis Date Noted   Neutropenia, drug-induced (HCC) 08/20/2018   Genetic testing 06/22/2018   Family history of lung cancer    Family history of colon cancer    Family history of pancreatic cancer    Malignant neoplasm of upper-inner quadrant of right breast in female, estrogen receptor positive (HCC) 04/19/2018    PCP: Almarie Jaeger, DO  REFERRING PROVIDER: Morna Crawford Pickle, NP  REFERRING DIAG: M79.89 (ICD-10-CM) - Arm swelling C50.211,Z17.0 (ICD-10-CM) - Malignant neoplasm of upper-inner quadrant of right breast in female, estrogen receptor positive (HCC) R22.1 (ICD-10-CM) - Neck swelling  THERAPY DIAG:  Postmastectomy lymphedema  Malignant neoplasm of upper-inner quadrant of right breast in female, estrogen receptor positive (HCC)  ONSET DATE: 11/11/23  Rationale for Evaluation and Treatment: Rehabilitation  SUBJECTIVE:  SUBJECTIVE STATEMENT: I've been wearing a compression sleeve and glove at work and overall it's going good but the pinky is too long and it's bothering me. I think I want to get measured for a custom glove. We can try the place in [employer]. My right hand got closed in a door over the weekend but thankfully it's healing well.   PERTINENT HISTORY: 11/20/18- right mastectomy for treatment of right breast cancer as well as SLNB (2 of 4 nodes positive), pt completed chemo and radiation. Had negative doppler and CT did not show any issues.   PAIN:  Are you having pain? No  PRECAUTIONS: Other: R UE lymphedema  RED FLAGS: None   WEIGHT BEARING RESTRICTIONS: No  FALLS:  Has patient fallen in last 6 months? 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  COGNITION: Overall cognitive status: Within functional limits for tasks  assessed   PALPATION: Fullness palpable at dorsal surface of hand  OBSERVATIONS / OTHER ASSESSMENTS: R hand, wrist and forearm visibly swollen compared to L side  POSTURE: forward head, rounded shoulders  UPPER EXTREMITY AROM/PROM: WFL   LYMPHEDEMA ASSESSMENTS:   LANDMARK RIGHT  eval Right 12/06/23 Right 01/03/24 Right 01/10/24 Right 01/31/24  At axilla  33.5 32.7 32.7 32.7 32.4  15 cm proximal to olecranon process 32.7 32.3 31.3 31.8 31.7  10 cm proximal to olecranon process 30 29.4 28.4 28.7 28.7  Olecranon process 26 25.4 24.5 25.1 24.8  15 cm proximal to ulnar styloid process 26.5 25.8 25.6 26 26.1  10 cm proximal to ulnar styloid process 22.2 22.7 22.8 23.2 23.6  Just proximal to ulnar styloid process 17.4 16.5 16.3 16.6 16.2  Across hand at thumb web space 20 19.7 18.6 19.4 18.7  At base of 2nd digit 6.2 6 5.9 5.9 5.7  (Blank rows = not tested)  LANDMARK LEFT  eval  At axilla  34  15 cm proximal to olecranon process 34  10 cm proximal to olecranon process 30.2  Olecranon process 25.4  15 cm proximal to ulnar styloid process 24.8  10 cm proximal to ulnar styloid process 21.2  Just proximal to ulnar styloid process 16  Across hand at thumb web space 18.2  At base of 2nd digit 5.8  (Blank rows = not tested)                                                                                                                             TREATMENT DATE:  01/31/24: Self Care Spent time at beginning of session assessing how new compression  garments are going. Pt reports overall well and she has been wearing them to work, however would like to be measured for custom glove due to her having shorter than average fingers and fingers are a big long, especially pinky. She would like to go to [employer] as this is closest to her so will email Emil at [employer] today pts demographics and script from Morna Pickle, NP. Therapeutic Exercise Pulleys into flex and abd x 2 mins each reminding pt  to decrease Rt scapula compensations with tactile and VC's. Roll yellow ball up wall into flex and Rt abd x 10 each Therapeutic Activities  Supine scapular series with red theraband x 5 each returning therapist demo and handout issued Manual Therapy Continued MLD to the Rt UE: Short neck, 5 diaphragmatic breaths, Lt axillary and pectoral nodes, anterior intact thorax sequence, anterior inter-axillary anastomosis, Rt inguinal nodes, Rt axillo-inguinal anastomosis, then focused on Rt UE working from proximal to distal then retracing all steps.   01/10/24: Orthotic Fit Initial Pt brought new compression sleeve and glove. Assessed fit of both and educated pt about how a rubber covered glove, like a gardening glove, can help adjust sleeve and glove, especially fingers of glove, once on. Pt able to verbalize understanding and return therapist demo.  Therapeutic Exercise Pulleys into flex and abd x 2 mins each with VC's to decrease Rt scap compensation Roll yellow ball up wall into flex x 10 returning therapist demo  Supine over half foam roll for bil UE scaption into a V x10, and then bil UE abd snow angle x10, 5 sec holds Manual Therapy P/ROM to Rt shoulder into flex, abd and D2 with scapular depression throughout STM to scar tissue in Rt axilla, this has softened some since start of care but still very tight and limiting to end ROM  01/03/24: Self Care At beginning of session helped pt to look up tracking for her sleeve. There was an issue with her address in their system and this got figured out for the sleeve and it has 2 tracking numbers, but the glove only has one. So instructed pt to call [employer] today to see if they can help her start tracking the glove. Pt verbalized understanding. Manual Therapy Continued MLD to the Rt UE: Short neck, 5 diaphragmatic breaths, Lt axillary and pectoral nodes, anterior intact thorax sequence, anterior inter-axillary anastomosis, Rt inguinal nodes,  Rt axillo-inguinal anastomosis, then focused on Rt UE working from proximal to distal then retracing all steps.  Circumference measurements taken, good reductions noted Compression: Donned pts Sigvaris sleeve and then compression bandaging to her hand/fingers: Molelast to fingers 1-4, artiflex to hand/wrist and then 1 - 6 cm short stretch compression bandage to hand/wrist. Pt was very pleased with this compromise until her glove arrives.   12/27/23: Self Care Discussed current functional status and assessed goals for renewal. Pt brought her new compression sleeve. Educated her how to don and doff this. It fits pt very well and she reports this feeling comfortable. She is now just awaiting the arrival of the glove.  Manual Therapy Continued MLD to the Rt UE: Short neck, 5 diaphragmatic breaths, Lt axillary nodes,  anterior inter-axillary anastomosis, Rt inguinal nodes, Rt axillo-inguinal anastomosis, then focused on Rt UE working from proximal to distal then retracing all steps. Pt reports she feels independent in self MLD.  P/ROM to Rt shoulder into flex, abd and D2 with scapular depression by therapist throughout  MFR: attempted this at areas of tightness in  Rt axilla but pt very ticklish and could not tolerate much so stopped Compression Bandaging: Cocoa butter to Rt UE, stockinette, Molelast to fingers 1-4, Artiflex from hand to axilla, then short stretch compression bandages as follows: 6 cm to hand/wrist, 10 cm herringbone from wrist with X at elbow, and 10 cm spiral from wrist to axilla.   12/13/23: Continued MLD to the Rt UE: Short neck, 5 diaphragmatic breaths, Lt axillary nodes,  anterior inter-axillary anastomosis, Rt inguinal nodes, Rt axillo-inguinal anastomosis, then focused on Rt UE working from proximal to distal then retracing all steps. Pt reports she feels independent in self MLD.  Compression Bandaging: Cocoa butter to Rt UE, stockinette, Molelast to fingers 1-4, Artiflex from hand  to axilla, then short stretch compression bandages as follows: 6 cm to hand/wrist, 10 cm herringbone from wrist with X at elbow, and 10 cm spiral from wrist to axilla.       PATIENT EDUCATION:  Education details:  Supine scap series with red theraband Person educated: Patient  Education method: Explanation, demonstration, tactile and VC's, handout issued Education comprehension: verbalized understanding, returned demo, tactile and VC's  HOME EXERCISE PROGRAM: 11/29/23: Self MLD 12/06/23: Self compression bandaging 01/31/24: Supine scap series with red theraband  ASSESSMENT:  CLINICAL IMPRESSION: Pt is doing great and has met all goals at this time. She does still need min assistance in gaining another set of compression garments however this therapist will email her information to [facility] and then pt will just need to make an appt to be fitted for custom compression sleeve and glove. She is ready for D/C at this time and knows she can reach out to this clinic if she requires any assistance.   OBJECTIVE IMPAIRMENTS: decreased knowledge of condition, decreased knowledge of use of DME, increased edema, and pain.   ACTIVITY LIMITATIONS: carrying and lifting  PARTICIPATION LIMITATIONS: occupation  PERSONAL FACTORS: none are also affecting patient's functional outcome.   REHAB POTENTIAL: Good  CLINICAL DECISION MAKING: Stable/uncomplicated  EVALUATION COMPLEXITY: Low  GOALS: Goals reviewed with patient? Yes  SHORT TERM GOALS=LONG TERM GOALS Target date: 01/24/24  Pt will be able to independently bandage her R UE for long term management of lymphedema.  Baseline: 12/27/23 - Pt and daughter are independent in this now Goal status: MET  2.  Pt will be independent in self MLD for long term management of lymphedema.  Baseline: 12/27/23 - Pt is independent with this Goal status: MET  3.  Pt will obtain compression garments for long term management of lymphedema.  Baseline: 12/27/23 - pt  has received her compression sleeve and this fits very well, her glove as yet to arrive Goal status: PARTIALLY MET  4. Pt will be able to verbalize lymphedema risk reduction practices   Goal status: MET    PLAN:  PT FREQUENCY: 2x/week  PT DURATION: 4 weeks  PLANNED INTERVENTIONS: 97164- PT Re-evaluation, 97110-Therapeutic exercises, 97530- Therapeutic activity, 97535- Self Care, 02859- Manual therapy, 97760- Orthotic Initial, [phone]- Orthotic/Prosthetic subsequent, Patient/Family education, Balance training, Joint mobilization, Therapeutic exercises, Therapeutic activity, Neuromuscular re-education, Gait training, and Self Care  PLAN FOR NEXT SESSION: D/C this visit.     Aden Berwyn Caldron, PTA 01/31/2024, 11:22 AM   Side Pull: Double Arm       Cancer Rehab [phone]   On back, knees bent, feet flat. Arms perpendicular to body, shoulder level, elbows straight but relaxed. Pull arms out to sides, elbows straight. Resistance band comes across collarbones, hands toward floor. Hold momentarily.  Slowly return to starting position. Repeat _5-10__ times. Band color _red____   Shoulder Rotation: Double Arm   On back, knees bent, feet flat, elbows tucked at sides, bent 90, hands palms up. Pull hands apart and down toward floor, keeping elbows near sides. Hold momentarily. Slowly return to starting position. Repeat _5-10__ times. Band color __red____   Over Head Pull: Narrow and Wide Grip      On back, knees bent, feet flat, band across thighs, elbows straight but relaxed. Pull hands apart (start). Keeping elbows straight, bring arms up and over head, hands toward floor. Keep pull steady on band. Hold momentarily. Return slowly, keeping pull steady, back to start. Then do same with a wider grip on the band (past shoulder width) Repeat _5-10__ times. Band color __red____    Sword   On back, knees bent, feet flat, left hand on left hip, right hand above left. Pull right arm  DIAGONALLY (hip to shoulder) across chest. Bring right arm along head toward floor. Hold momentarily. Slowly return to starting position. Repeat _5-10__ times. Do with left arm. Band color _red_____    PHYSICAL THERAPY DISCHARGE SUMMARY  Visits from Start of Care: 10  Current functional level related to goals / functional outcomes: All goals met   Remaining deficits: None   Education / Equipment: HEP, lymphedema education, compression garments   Patient agrees to discharge. Patient goals were met. Patient is being discharged due to meeting the stated rehab goals.  Florina Sever [address], [place] 02/14/24 11:53 AM

## 2024-02-23 ENCOUNTER — Telehealth: Payer: Self-pay | Admitting: *Deleted

## 2024-02-23 NOTE — Telephone Encounter (Signed)
 Per MD request RN placed call to pt with recent Guardant Reveal results being negative.  Pt educated and verbalized understanding.

## 2024-02-26 ENCOUNTER — Encounter: Payer: Self-pay | Admitting: Hematology and Oncology

## 2024-06-10 ENCOUNTER — Telehealth: Payer: Self-pay | Admitting: Hematology and Oncology

## 2024-06-10 NOTE — Telephone Encounter (Signed)
 Left vm for pt about appt changed date and time due to provider not being in the office. Encouraged tgo call back if need to reschedule.

## 2024-07-08 ENCOUNTER — Ambulatory Visit: Payer: BC Managed Care – PPO | Admitting: Hematology and Oncology

## 2024-07-10 ENCOUNTER — Inpatient Hospital Stay: Attending: Hematology and Oncology | Admitting: Hematology and Oncology

## 2024-07-10 VITALS — BP 124/78 | HR 98 | Temp 98.1°F | Resp 18 | Wt 179.9 lb

## 2024-07-10 DIAGNOSIS — Z9011 Acquired absence of right breast and nipple: Secondary | ICD-10-CM | POA: Diagnosis not present

## 2024-07-10 DIAGNOSIS — Z79810 Long term (current) use of selective estrogen receptor modulators (SERMs): Secondary | ICD-10-CM | POA: Diagnosis not present

## 2024-07-10 DIAGNOSIS — Z923 Personal history of irradiation: Secondary | ICD-10-CM | POA: Insufficient documentation

## 2024-07-10 DIAGNOSIS — C50211 Malignant neoplasm of upper-inner quadrant of right female breast: Secondary | ICD-10-CM | POA: Insufficient documentation

## 2024-07-10 DIAGNOSIS — C50411 Malignant neoplasm of upper-outer quadrant of right female breast: Secondary | ICD-10-CM | POA: Diagnosis not present

## 2024-07-10 DIAGNOSIS — Z1721 Progesterone receptor positive status: Secondary | ICD-10-CM | POA: Diagnosis not present

## 2024-07-10 DIAGNOSIS — Z170 Estrogen receptor positive status [ER+]: Secondary | ICD-10-CM | POA: Diagnosis not present

## 2024-07-10 DIAGNOSIS — Z79899 Other long term (current) drug therapy: Secondary | ICD-10-CM | POA: Diagnosis not present

## 2024-07-10 DIAGNOSIS — Z1732 Human epidermal growth factor receptor 2 negative status: Secondary | ICD-10-CM | POA: Insufficient documentation

## 2024-07-10 MED ORDER — TAMOXIFEN CITRATE 20 MG PO TABS: 20.0000 mg | ORAL_TABLET | Freq: Every day | ORAL | 4 refills | Status: AC

## 2024-07-10 NOTE — Assessment & Plan Note (Signed)
 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  Neoadjuvant chemotherapy with dose dense Adriamycin  and Cytoxan  x4 followed by Taxol  weekly x11 05/09/2018-10/02/2018 2.  11/20/2018: Right mastectomy and axillary lymph node dissection 3.  Since the final path was HER-2 positive she will get 1 year of Herceptin  completed 01/17/20 4.  Adjuvant radiation 01/01/2019-02/14/2019 5.  Followed by adjuvant antiestrogen therapy patient prefers tamoxifen  starting 03/28/2019 ----------------------------------------------------------- 11/20/2018:Right mastectomy: Residual grade 2 invasive ductal carcinoma multifocal, largest 1.8 cm, intermediate grade DCIS, margins negative, lymphovascular space invasion present, 0/6 lymph nodes negative, ER 60%, PR 60%, HER-2 positive ratio 2.59   Current treatment: Tamoxifen  20 mg daily 03/28/2019    Tamoxifen  toxicities:  Hot flashes: Moderate  Menstrual cycles have resumed and the seem to be heavier.  Pelvic ultrasound 11/13/2022: Endometrial polyp   Breast cancer surveillance:  Left mammogram 07/21/2023: Benign breast density category A, prior right mastectomy Breast Exam: 07/10/2024: Benign, right chest wall scar is without any lumps or nodules. MRI liver 11/22/2023: Scattered hepatic cysts Guardant reveal 02/07/2024: 0% negative  Return to clinic in 1 year for follow-up

## 2024-07-10 NOTE — Progress Notes (Signed)
 "  Patient Care Team: Miranda Dwayne NOVAK, FNP as PCP - General (Nurse Practitioner) Miranda, Ayers SAUNDERS, MD as PCP - Advanced Heart Failure (Cardiology) Miranda Wilbert SAUNDERS, MD as PCP - Sleep Medicine (Cardiology) Ayers [name], MD as Consulting Physician (General Surgery) Miranda Potts, MD as Consulting Physician (Hematology and Oncology) Miranda Rush, MD as Consulting Physician (Radiation Oncology)  DIAGNOSIS:  Encounter Diagnosis  Name Primary?   Malignant neoplasm of upper-inner quadrant of right breast in female, estrogen receptor positive (HCC) Yes    SUMMARY OF ONCOLOGIC HISTORY: Oncology History  Malignant neoplasm of upper-inner quadrant of right breast in female, estrogen receptor positive (HCC)  04/17/2018 Initial Diagnosis   Palpable right breast masses UOQ and UIQ with multiple masses 12 to 14 cm, at 1:00 1.6 cm, 1231.8 cm, and o'clock 4.5 cm and at 9:30 position 5 cm: Biopsy revealed IDC with DCIS, grade 3, ER 80%, PR 0%, HER-2 negative, Ki-67 20%; biopsy of the 4.5 cm mass came back as IDC grade 2-3, ER 50%, PR 5%, HER-2 -2+ ratio 1.6, copy #3.2, Ki-67 20%, T2N1 stage IIb   05/11/2018 - 01/17/2020 Chemotherapy   dexamethasone  (DECADRON ) 4 MG tablet, 4 mg (100 % of original dose 4 mg), Oral, Daily, 1 of 1 cycle, Start date: 04/25/2018, End date: 07/10/2018. Dose modification: 4 mg (original dose 4 mg, Cycle 0)  DOXOrubicin  (ADRIAMYCIN ) chemo injection 104 mg, 60 mg/m2 = 104 mg, Intravenous,  Once, 4 of 4 cycles. Administration: 104 mg (05/11/2018), 104 mg (05/23/2018), 104 mg (06/06/2018), 104 mg (06/22/2018)  palonosetron  (ALOXI ) injection 0.25 mg, 0.25 mg, Intravenous,  Once, 4 of 4 cycles. Administration: 0.25 mg (05/11/2018), 0.25 mg (05/23/2018), 0.25 mg (06/06/2018), 0.25 mg (06/22/2018)  pegfilgrastim -cbqv (UDENYCA ) injection 6 mg, 6 mg, Subcutaneous, Once, 4 of 4 cycles. Administration: 6 mg (05/12/2018), 6 mg (05/24/2018), 6 mg (06/07/2018), 6 mg (06/23/2018)  cyclophosphamide   (CYTOXAN ) 1,040 mg in sodium chloride  0.9 % 250 mL chemo infusion, 600 mg/m2 = 1,040 mg, Intravenous,  Once, 4 of 4 cycles. Administration: 1,040 mg (05/11/2018), 1,040 mg (05/23/2018), 1,040 mg (06/06/2018), 1,040 mg (06/22/2018)  PACLitaxel  (TAXOL ) 138 mg in sodium chloride  0.9 % 250 mL chemo infusion (</= 80mg /m2), 80 mg/m2 = 138 mg, Intravenous,  Once, 11 of 12 cycles. Administration: 138 mg (07/10/2018), 138 mg (07/17/2018), 138 mg (07/24/2018), 138 mg (08/14/2018), 138 mg (08/22/2018), 138 mg (08/28/2018), 138 mg (09/04/2018), 138 mg (09/11/2018), 138 mg (09/18/2018), 138 mg (09/25/2018), 138 mg (10/02/2018)  fosaprepitant  (EMEND ) 150 mg  dexamethasone  (DECADRON ) 12 mg in sodium chloride  0.9 % 145 mL IVPB, , Intravenous,  Once, 4 of 4 cycles. Administration:  (05/11/2018),  (05/23/2018),  (06/06/2018),  (06/22/2018)  trastuzumab  (HERCEPTIN ) 546 mg in sodium chloride  0.9 % 250 mL chemo infusion, 8 mg/kg = 546 mg, Intravenous,  Once, 3 of 3 cycles. Administration: 546 mg (12/14/2018), 399 mg (01/03/2019), 399 mg (01/24/2019)  trastuzumab -dkst (OGIVRI ) 399 mg in sodium chloride  0.9 % 250 mL chemo infusion, 6 mg/kg = 399 mg (100 % of original dose 6 mg/kg), Intravenous,  Once, 14 of 14 cycles. Dose modification: 6 mg/kg (original dose 6 mg/kg, Cycle 4, Reason: Other (see comments), Comment: Biosimilar Conversion), 6 mg/kg (original dose 6 mg/kg, Cycle 15, Reason: Other (see comments), Comment: weight increase). 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  UPDATE: RET c.398G>A (p.Arg133His) VUS has been reclassified to Likely Benign.  The amended report date is September 25, 2023.  The Multi-Gene Panel offered by [facility] includes sequencing and/or deletion duplication testing of the following 84 genes: AIP, ALK, APC, ATM, AXIN2,BAP1,  BARD1, BLM, BMPR1A, BRCA1, BRCA2, BRIP1, CASR, CDC73, CDH1, CDK4, CDKN1B, CDKN1C, CDKN2A (p14ARF), CDKN2A (p16INK4a), CEBPA, CHEK2, CTNNA1, DICER1, DIS3L2, EGFR (c.2369C>T, p.Thr790Met variant only), EPCAM (Deletion/duplication testing only), FH, FLCN, GATA2, GPC3, GREM1 (Promoter region deletion/duplication testing only), HOXB13 (c.251G>A, p.Gly84Glu), HRAS, KIT, MAX, MEN1, MET, MITF (c.952G>A, p.Glu318Lys variant only), MLH1, MSH2, MSH3, MSH6, MUTYH, NBN, NF1, NF2, NTHL1, PALB2, PDGFRA, PHOX2B, PMS2, POLD1, POLE, POT1, PRKAR1A, PTCH1, PTEN, RAD50, RAD51C, RAD51D, RB1, RECQL4, RET, RUNX1, SDHAF2, SDHA (sequence changes only), SDHB, SDHC, SDHD, SMAD4, SMARCA4, SMARCB1, SMARCE1, STK11, SUFU, TERC, TERT, TMEM127, TP53, TSC1, TSC2, VHL, WRN and WT1.  The report date is 06/19/2018.   10/11/2018 Breast MRI   Significant decrease in the size of the right axillary adenopathy and right breast masses from 1.2cm to 0.9cm with minimal residual enhancement, and 2.4x1.4cm to 2.3x1.9cm.   11/20/2018 Surgery   Right mastectomy ([facility]) ([id number]): Residual grade 2 invasive ductal carcinoma multifocal, largest 1.8 cm, intermediate grade DCIS, margins negative, lymphovascular space invasion present, 6 lymph nodes negative, ER 60%, PR 60%, HER-2 positive ratio 2.59   11/20/2018 Cancer Staging   Staging form: Breast, AJCC 8th Edition - Pathologic stage from 11/20/2018: Stage IA (pT1c, pN0, cM0, G2, ER+, PR+, HER2+)   01/01/2019 - 02/14/2019 Radiation Therapy   The patient initially received a dose of 50.4 Gy in 28 fractions to the chest wall and supraclavicular region. This was delivered using a 3-D conformal, 4 field technique. The patient then received a boost to the mastectomy scar. This delivered an additional 10 Gy in 5 fractions using an en face electron  field. The total dose was 60.4 Gy.   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  She is five years from initial diagnosis of right breast cancer and remains on tamoxifen  without new symptoms or medication changes since her last visit. She denies new breast symptoms, pain, or systemic complaints.  She continues to have menopausal symptoms and difficulty with weight management, which she attributes to tamoxifen  and her work schedule. She attempts to exercise but struggles to maintain regular activity.  Surveillance has included regular mammograms and GARDENT blood tests, all unremarkable. Her last mammogram was in January of the previous year and she is due for her next annual screening, which is not yet scheduled.      ALLERGIES:  has no known allergies.  MEDICATIONS:  Current Outpatient Medications  Medication Sig Dispense Refill   hydrochlorothiazide  (HYDRODIURIL ) 12.5 MG tablet 12.5 mg daily.     spironolactone  (ALDACTONE ) 25 MG tablet TAKE 1/2 (ONE-HALF) TABLET BY MOUTH ONCE DAILY (ABSOLUTE  LAST  REFILL  WITHOUT  OFFICE  VISIT  PLEASE  CALL  [phone]  TO  SCHEDULE) 15 tablet 0   tamoxifen  (NOLVADEX ) 20 MG tablet Take 1 tablet (20 mg total) by mouth daily. 90  tablet 4   No current facility-administered medications for this visit.    PHYSICAL EXAMINATION: ECOG PERFORMANCE STATUS: 1 - Symptomatic but completely ambulatory  Vitals:   07/10/24 1142  BP: 124/78  Pulse: 98  Resp: 18  Temp: 98.1 F (36.7 C)  SpO2: 97%   Filed Weights   07/10/24 1142  Weight: 179 lb 14.4 oz (81.6 kg)   LABORATORY DATA:  I have  reviewed the data as listed    Latest Ref Rng & Units 01/17/2020    9:17 AM 12/27/2019   10:41 AM 11/07/2019    8:25 AM  CMP  Glucose 70 - 99 mg/dL 86  83  86   BUN 6 - 20 mg/dL 11  10  9    Creatinine 0.44 - 1.00 mg/dL 9.21  9.17  9.24   Sodium 135 - 145 mmol/L 140  139  140   Potassium 3.5 - 5.1 mmol/L 4.0  3.3  3.9   Chloride 98 - 111 mmol/L 109  109  108   CO2 22 - 32 mmol/L 22  22  24    Calcium 8.9 - 10.3 mg/dL 9.6  9.1  8.9   Total Protein 6.5 - 8.1 g/dL 7.3  7.6  7.1   Total Bilirubin 0.3 - 1.2 mg/dL 0.3  0.4  0.3   Alkaline Phos 38 - 126 U/L 48  45  51   AST 15 - 41 U/L 14  14  16    ALT 0 - 44 U/L 12  11  12      Lab Results  Component Value Date   WBC 3.1 (L) 01/17/2020   HGB 13.0 01/17/2020   HCT 40.4 01/17/2020   MCV 89.6 01/17/2020   PLT 159 01/17/2020   NEUTROABS 1.3 (L) 01/17/2020    ASSESSMENT & PLAN:  Malignant neoplasm of upper-inner quadrant of right breast in female, estrogen receptor positive (HCC) 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%, PR 0%, HER-2 negative, Ki-67 20%; biopsy of the 4.5 cm mass came back as IDC grade 2-3, ER 50%, PR 5%, HER-2 -2+ ratio 1.6, copy #3.2, Ki-67 20%, T2N1 stage IIb   Recommendation: 1.  Neoadjuvant chemotherapy with dose dense Adriamycin  and Cytoxan  x4 followed by Taxol  weekly x11 05/09/2018-10/02/2018 2.  11/20/2018: Right mastectomy and axillary lymph node dissection 3.  Since the final path was HER-2 positive she will get 1 year of Herceptin  completed 01/17/20 4.  Adjuvant radiation 01/01/2019-02/14/2019 5.  Followed by adjuvant antiestrogen therapy patient prefers tamoxifen  starting 03/28/2019 ----------------------------------------------------------- 11/20/2018:Right mastectomy: Residual grade 2 invasive ductal carcinoma multifocal, largest 1.8 cm, intermediate grade  DCIS, margins negative, lymphovascular space invasion present, 0/6 lymph nodes negative, ER 60%, PR 60%, HER-2 positive ratio 2.59   Current treatment: Tamoxifen  20 mg daily 03/28/2019    Tamoxifen  toxicities:  Hot flashes: Moderate  Menstrual cycles have resumed and the seem to be heavier.  Pelvic ultrasound 11/13/2022: Endometrial polyp   Breast cancer surveillance:  Left mammogram 07/21/2023: Benign breast density category A, prior right mastectomy Breast Exam: 07/10/2024: Benign, right chest wall scar is without any lumps or nodules. MRI liver 11/22/2023: Scattered hepatic cysts Guardant reveal 02/07/2024: 0% negative  Weight issues: She will talk to her PCP about GLP-1 agonist Return to clinic in 1 year for follow-up   No orders of  the defined types were placed in this encounter.  The patient has a good understanding of the overall plan. she agrees with it. she will call with any problems that may develop before the next visit here.  I personally spent a total of 30 minutes in the care of the patient today including preparing to see the patient, getting/reviewing separately obtained history, performing a medically appropriate exam/evaluation, counseling and educating, placing orders, referring and communicating with other health care professionals, documenting clinical information in the EHR, independently interpreting results, communicating results, and coordinating care.   Viinay K Catherina Pates, MD 07/10/24    "

## 2024-07-12 ENCOUNTER — Other Ambulatory Visit: Payer: Self-pay | Admitting: Hematology and Oncology

## 2024-07-12 DIAGNOSIS — Z1231 Encounter for screening mammogram for malignant neoplasm of breast: Secondary | ICD-10-CM

## 2024-07-25 ENCOUNTER — Ambulatory Visit
Admission: RE | Admit: 2024-07-25 | Discharge: 2024-07-25 | Disposition: A | Source: Ambulatory Visit | Attending: Hematology and Oncology

## 2024-07-25 DIAGNOSIS — Z1231 Encounter for screening mammogram for malignant neoplasm of breast: Secondary | ICD-10-CM

## 2025-07-10 ENCOUNTER — Inpatient Hospital Stay: Admitting: Hematology and Oncology
